# Patient Record
Sex: Female | Born: 1970 | Race: Black or African American | Hispanic: No | Marital: Married | Smoking: Former smoker
Health system: Southern US, Community
[De-identification: ages and names within clinical notes are randomized; demographics above are authoritative.]

## PROBLEM LIST (undated history)

## (undated) ENCOUNTER — Inpatient Hospital Stay: Admission: EM | Payer: Self-pay | Source: Home / Self Care

## (undated) DIAGNOSIS — N200 Calculus of kidney: Secondary | ICD-10-CM

## (undated) DIAGNOSIS — N83209 Unspecified ovarian cyst, unspecified side: Secondary | ICD-10-CM

## (undated) DIAGNOSIS — I10 Essential (primary) hypertension: Secondary | ICD-10-CM

## (undated) DIAGNOSIS — G4730 Sleep apnea, unspecified: Secondary | ICD-10-CM

## (undated) DIAGNOSIS — T7840XA Allergy, unspecified, initial encounter: Secondary | ICD-10-CM

## (undated) DIAGNOSIS — G4733 Obstructive sleep apnea (adult) (pediatric): Principal | ICD-10-CM

## (undated) DIAGNOSIS — E039 Hypothyroidism, unspecified: Secondary | ICD-10-CM

## (undated) DIAGNOSIS — E669 Obesity, unspecified: Secondary | ICD-10-CM

## (undated) DIAGNOSIS — M549 Dorsalgia, unspecified: Secondary | ICD-10-CM

## (undated) DIAGNOSIS — B9689 Other specified bacterial agents as the cause of diseases classified elsewhere: Secondary | ICD-10-CM

## (undated) DIAGNOSIS — M7989 Other specified soft tissue disorders: Secondary | ICD-10-CM

## (undated) DIAGNOSIS — R7303 Prediabetes: Secondary | ICD-10-CM

## (undated) DIAGNOSIS — O139 Gestational [pregnancy-induced] hypertension without significant proteinuria, unspecified trimester: Secondary | ICD-10-CM

## (undated) DIAGNOSIS — K219 Gastro-esophageal reflux disease without esophagitis: Secondary | ICD-10-CM

## (undated) DIAGNOSIS — IMO0002 Reserved for concepts with insufficient information to code with codable children: Secondary | ICD-10-CM

## (undated) DIAGNOSIS — Z8619 Personal history of other infectious and parasitic diseases: Secondary | ICD-10-CM

## (undated) DIAGNOSIS — N951 Menopausal and female climacteric states: Secondary | ICD-10-CM

## (undated) DIAGNOSIS — K829 Disease of gallbladder, unspecified: Secondary | ICD-10-CM

## (undated) DIAGNOSIS — R87619 Unspecified abnormal cytological findings in specimens from cervix uteri: Secondary | ICD-10-CM

## (undated) DIAGNOSIS — R0602 Shortness of breath: Secondary | ICD-10-CM

## (undated) DIAGNOSIS — F329 Major depressive disorder, single episode, unspecified: Secondary | ICD-10-CM

## (undated) DIAGNOSIS — R05 Cough: Secondary | ICD-10-CM

## (undated) DIAGNOSIS — D219 Benign neoplasm of connective and other soft tissue, unspecified: Secondary | ICD-10-CM

## (undated) DIAGNOSIS — E559 Vitamin D deficiency, unspecified: Secondary | ICD-10-CM

## (undated) DIAGNOSIS — R059 Cough, unspecified: Secondary | ICD-10-CM

## (undated) DIAGNOSIS — Z5189 Encounter for other specified aftercare: Secondary | ICD-10-CM

## (undated) DIAGNOSIS — N289 Disorder of kidney and ureter, unspecified: Secondary | ICD-10-CM

## (undated) DIAGNOSIS — R638 Other symptoms and signs concerning food and fluid intake: Secondary | ICD-10-CM

## (undated) DIAGNOSIS — F419 Anxiety disorder, unspecified: Secondary | ICD-10-CM

## (undated) DIAGNOSIS — N760 Acute vaginitis: Secondary | ICD-10-CM

## (undated) DIAGNOSIS — R634 Abnormal weight loss: Secondary | ICD-10-CM

## (undated) DIAGNOSIS — E876 Hypokalemia: Secondary | ICD-10-CM

## (undated) DIAGNOSIS — Z8742 Personal history of other diseases of the female genital tract: Secondary | ICD-10-CM

## (undated) HISTORY — DX: Disease of gallbladder, unspecified: K82.9

## (undated) HISTORY — DX: Personal history of other diseases of the female genital tract: Z87.42

## (undated) HISTORY — DX: Other specified bacterial agents as the cause of diseases classified elsewhere: B96.89

## (undated) HISTORY — DX: Other specified bacterial agents as the cause of diseases classified elsewhere: N76.0

## (undated) HISTORY — DX: Abnormal weight loss: R63.4

## (undated) HISTORY — DX: Allergy, unspecified, initial encounter: T78.40XA

## (undated) HISTORY — DX: Disorder of kidney and ureter, unspecified: N28.9

## (undated) HISTORY — DX: Menopausal and female climacteric states: N95.1

## (undated) HISTORY — DX: Encounter for other specified aftercare: Z51.89

## (undated) HISTORY — DX: Obstructive sleep apnea (adult) (pediatric): G47.33

## (undated) HISTORY — DX: Prediabetes: R73.03

## (undated) HISTORY — DX: Other specified soft tissue disorders: M79.89

## (undated) HISTORY — DX: Major depressive disorder, single episode, unspecified: F32.9

## (undated) HISTORY — DX: Personal history of other infectious and parasitic diseases: Z86.19

## (undated) HISTORY — PX: OTHER SURGICAL HISTORY: SHX169

## (undated) HISTORY — DX: Hypothyroidism, unspecified: E03.9

## (undated) HISTORY — DX: Unspecified ovarian cyst, unspecified side: N83.209

## (undated) HISTORY — DX: Reserved for concepts with insufficient information to code with codable children: IMO0002

## (undated) HISTORY — DX: Dorsalgia, unspecified: M54.9

## (undated) HISTORY — DX: Benign neoplasm of connective and other soft tissue, unspecified: D21.9

## (undated) HISTORY — DX: Obesity, unspecified: E66.9

## (undated) HISTORY — DX: Unspecified abnormal cytological findings in specimens from cervix uteri: R87.619

## (undated) HISTORY — DX: Other symptoms and signs concerning food and fluid intake: R63.8

## (undated) HISTORY — DX: Vitamin D deficiency, unspecified: E55.9

## (undated) HISTORY — DX: Gestational (pregnancy-induced) hypertension without significant proteinuria, unspecified trimester: O13.9

---
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
## 1992-01-07 HISTORY — PX: CHOLECYSTECTOMY: SHX55

## 1997-01-06 HISTORY — PX: TUBAL LIGATION: SHX77

## 2004-03-27 ENCOUNTER — Emergency Department (HOSPITAL_COMMUNITY): Admission: EM | Admit: 2004-03-27 | Discharge: 2004-03-27 | Payer: Self-pay | Admitting: Emergency Medicine

## 2006-01-26 ENCOUNTER — Encounter: Admission: RE | Admit: 2006-01-26 | Discharge: 2006-01-26 | Payer: Self-pay | Admitting: Cardiology

## 2006-02-05 ENCOUNTER — Ambulatory Visit (HOSPITAL_COMMUNITY): Admission: RE | Admit: 2006-02-05 | Discharge: 2006-02-05 | Payer: Self-pay | Admitting: Cardiology

## 2008-01-07 DIAGNOSIS — F32A Depression, unspecified: Secondary | ICD-10-CM

## 2008-01-07 DIAGNOSIS — R634 Abnormal weight loss: Secondary | ICD-10-CM

## 2008-01-07 HISTORY — DX: Depression, unspecified: F32.A

## 2008-01-07 HISTORY — DX: Abnormal weight loss: R63.4

## 2009-01-06 DIAGNOSIS — N83209 Unspecified ovarian cyst, unspecified side: Secondary | ICD-10-CM

## 2009-01-06 DIAGNOSIS — D219 Benign neoplasm of connective and other soft tissue, unspecified: Secondary | ICD-10-CM

## 2009-01-06 DIAGNOSIS — Z8742 Personal history of other diseases of the female genital tract: Secondary | ICD-10-CM

## 2009-01-06 HISTORY — DX: Benign neoplasm of connective and other soft tissue, unspecified: D21.9

## 2009-01-06 HISTORY — DX: Personal history of other diseases of the female genital tract: Z87.42

## 2009-01-06 HISTORY — DX: Unspecified ovarian cyst, unspecified side: N83.209

## 2009-03-06 HISTORY — PX: OTHER SURGICAL HISTORY: SHX169

## 2009-04-05 ENCOUNTER — Ambulatory Visit (HOSPITAL_COMMUNITY): Admission: RE | Admit: 2009-04-05 | Discharge: 2009-04-05 | Payer: Self-pay | Admitting: Obstetrics and Gynecology

## 2010-04-01 LAB — CBC
HCT: 33.5 % — ABNORMAL LOW (ref 36.0–46.0)
Hemoglobin: 10.8 g/dL — ABNORMAL LOW (ref 12.0–15.0)
MCHC: 32.2 g/dL (ref 30.0–36.0)

## 2010-04-01 LAB — BASIC METABOLIC PANEL WITH GFR
BUN: 8 mg/dL (ref 6–23)
CO2: 28 meq/L (ref 19–32)
Calcium: 9.1 mg/dL (ref 8.4–10.5)
Chloride: 102 meq/L (ref 96–112)
Creatinine, Ser: 0.76 mg/dL (ref 0.4–1.2)
GFR calc non Af Amer: >60 mL/min
Glucose, Bld: 103 mg/dL — ABNORMAL HIGH (ref 70–99)
Potassium: 3.1 meq/L — ABNORMAL LOW (ref 3.5–5.1)
Sodium: 138 meq/L (ref 135–145)

## 2010-04-01 LAB — HCG, SERUM, QUALITATIVE: Preg, Serum: NEGATIVE

## 2010-05-24 NOTE — Cardiovascular Report (Signed)
NAMEARIAUNNA, FLEMING             ACCOUNT NO.:  [id number]   MEDICAL RECORD NO.:  [id number]          PATIENT TYPE:  AMB   LOCATION:  SDS                          FACILITY:  [facility]   PHYSICIAN:  Ulice Dash R. Einar Gip, MD       DATE OF BIRTH:  [birth year]   DATE OF PROCEDURE:  02/05/2006  DATE OF DISCHARGE:  02/05/2006                            CARDIAC CATHETERIZATION   REFERRING PHYSICIAN:  Glendale Chard, M.D.   PROCEDURES PERFORMED:  1. Abdominal aortogram.  2. Right femoral angiography and closure of right femoral arterial      axis with StarClose.   INDICATIONS:  Ms. Auch is a 40 year old African-American female with  difficult to control hypertension, morbid obesity, who was referred to  me for evaluation of difficult to control hypertension.  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  Given this, she was brought to the  catheterization lab to evaluate her renal anatomy to confirm renal  artery stenosis with an eye towards possible angioplasty.  There was a  suspicion for fibromuscular dysplasia.   ABDOMINAL AORTOGRAM:  Abdominal aortogram revealed a smooth, normal-  sized abdominal aorta.  There were two renal arteries, one on either  side, from the left and right.  The left renal artery has an anterior  origin but is otherwise smooth and normal.   IMPRESSION:  Normal renal arteries.  No evidence of renal artery  stenosis.  No evidence of abdominal aortic aneurysm.  Normal abdominal  aorta.   RECOMMENDATIONS:  Continued therapy for essential hypertension is  indicated.  The blood pressure now appears to be significantly improved  and controlled.  Will increase Cartia XT to 300 mg once a day.  Will see  her back in the office and make changes in her medications and also  weight loss exercise therapy is indicated.  She will follow up with Dr.  Glendale Chard in two to three weeks.   DETAILS OF PROCEDURE:   Under usual sterile precautions, using a 6  [place], right femoral artery lateral access, a 6 French pigtail catheter  was advanced to the abdominal aorta and an abdominal aortogram was  performed.  The catheter was then pulled out of the [facility].  Right  femoral angiography was performed through the arterial access sheath and  the access was closed with StarClose.  Excellent hemostasis was  obtained.      Eden Lathe. Einar Gip, MD  Electronically Signed     JRG/MEDQ  D:  02/05/2006  T:  02/06/2006  Job:  [id number]   cc:   Theda Belfast. Baird Cancer, M.D.

## 2010-06-20 ENCOUNTER — Emergency Department (HOSPITAL_COMMUNITY)
Admission: EM | Admit: 2010-06-20 | Discharge: 2010-06-20 | Disposition: A | Discharge status: Home / Self Care | Payer: No Typology Code available for payment source | Attending: Emergency Medicine | Admitting: Emergency Medicine

## 2010-06-20 DIAGNOSIS — I10 Essential (primary) hypertension: Secondary | ICD-10-CM | POA: Insufficient documentation

## 2010-06-20 DIAGNOSIS — S335XXA Sprain of ligaments of lumbar spine, initial encounter: Secondary | ICD-10-CM | POA: Insufficient documentation

## 2010-06-20 DIAGNOSIS — E7800 Pure hypercholesterolemia, unspecified: Secondary | ICD-10-CM | POA: Insufficient documentation

## 2010-06-20 DIAGNOSIS — Y92410 Unspecified street and highway as the place of occurrence of the external cause: Secondary | ICD-10-CM | POA: Insufficient documentation

## 2010-06-20 DIAGNOSIS — S139XXA Sprain of joints and ligaments of unspecified parts of neck, initial encounter: Secondary | ICD-10-CM | POA: Insufficient documentation

## 2010-07-15 DIAGNOSIS — N951 Menopausal and female climacteric states: Secondary | ICD-10-CM

## 2010-07-15 HISTORY — DX: Menopausal and female climacteric states: N95.1

## 2010-08-19 ENCOUNTER — Inpatient Hospital Stay (HOSPITAL_COMMUNITY)
Admission: EM | Admit: 2010-08-19 | Discharge: 2010-08-26 | DRG: 641 | Disposition: A | Discharge status: Home / Self Care | Payer: 59 | Attending: Internal Medicine | Admitting: Internal Medicine

## 2010-08-19 ENCOUNTER — Emergency Department (HOSPITAL_COMMUNITY): Payer: 59

## 2010-08-19 DIAGNOSIS — E872 Acidosis, unspecified: Secondary | ICD-10-CM | POA: Diagnosis present

## 2010-08-19 DIAGNOSIS — E876 Hypokalemia: Principal | ICD-10-CM | POA: Diagnosis present

## 2010-08-19 DIAGNOSIS — Z6841 Body Mass Index (BMI) 40.0 and over, adult: Secondary | ICD-10-CM

## 2010-08-19 DIAGNOSIS — N289 Disorder of kidney and ureter, unspecified: Secondary | ICD-10-CM | POA: Diagnosis present

## 2010-08-19 DIAGNOSIS — R209 Unspecified disturbances of skin sensation: Secondary | ICD-10-CM | POA: Diagnosis present

## 2010-08-19 DIAGNOSIS — E785 Hyperlipidemia, unspecified: Secondary | ICD-10-CM | POA: Diagnosis present

## 2010-08-19 DIAGNOSIS — R7301 Impaired fasting glucose: Secondary | ICD-10-CM | POA: Diagnosis present

## 2010-08-19 DIAGNOSIS — F17200 Nicotine dependence, unspecified, uncomplicated: Secondary | ICD-10-CM | POA: Diagnosis present

## 2010-08-19 DIAGNOSIS — I10 Essential (primary) hypertension: Secondary | ICD-10-CM | POA: Diagnosis present

## 2010-08-19 LAB — URINALYSIS, ROUTINE W REFLEX MICROSCOPIC
Ketones, ur: NEGATIVE mg/dL
Leukocytes, UA: NEGATIVE
Protein, ur: >300 mg/dL — AB
Specific Gravity, Urine: 1.016 (ref 1.005–1.030)
Urobilinogen, UA: 1 mg/dL (ref 0.0–1.0)
pH: 6.5 (ref 5.0–8.0)

## 2010-08-19 LAB — CBC
HCT: 40.5 % (ref 36.0–46.0)
MCH: 32.6 pg (ref 26.0–34.0)
RBC: 4.45 MIL/uL (ref 3.87–5.11)
WBC: 9.9 10*3/uL (ref 4.0–10.5)

## 2010-08-19 LAB — BASIC METABOLIC PANEL
BUN: 13 mg/dL (ref 6–23)
Calcium: 8.9 mg/dL (ref 8.4–10.5)
Chloride: 87 mEq/L — ABNORMAL LOW (ref 96–112)

## 2010-08-19 LAB — DIFFERENTIAL
Basophils Relative: 0 % (ref 0–1)
Eosinophils Relative: 3 % (ref 0–5)
Lymphocytes Relative: 22 % (ref 12–46)
Lymphs Abs: 2.2 10*3/uL (ref 0.7–4.0)
Monocytes Absolute: 0.5 10*3/uL (ref 0.1–1.0)
Monocytes Relative: 5 % (ref 3–12)

## 2010-08-19 LAB — URINE MICROSCOPIC-ADD ON

## 2010-08-19 LAB — PHOSPHORUS: Phosphorus: 4 mg/dL (ref 2.3–4.6)

## 2010-08-19 LAB — HEMOGLOBIN A1C: Hgb A1c MFr Bld: 6.4 % — ABNORMAL HIGH (ref <5.7)

## 2010-08-20 LAB — BASIC METABOLIC PANEL
Calcium: 7.9 mg/dL — ABNORMAL LOW (ref 8.4–10.5)
Chloride: 94 mEq/L — ABNORMAL LOW (ref 96–112)
Creatinine, Ser: 0.73 mg/dL (ref 0.50–1.10)
GFR calc non Af Amer: >60 mL/min (ref >60)
Glucose, Bld: 113 mg/dL — ABNORMAL HIGH (ref 70–99)

## 2010-08-20 LAB — URINE CULTURE: Colony Count: 60000

## 2010-08-20 LAB — POTASSIUM: Potassium: 2.4 mEq/L — CL (ref 3.5–5.1)

## 2010-08-20 LAB — LIPID PANEL
Triglycerides: 355 mg/dL — ABNORMAL HIGH (ref <150)
VLDL: 71 mg/dL — ABNORMAL HIGH (ref 0–40)

## 2010-08-20 LAB — CBC
Hemoglobin: 12.7 g/dL (ref 12.0–15.0)
MCHC: 35.8 g/dL (ref 30.0–36.0)
MCV: 91.5 fL (ref 78.0–100.0)

## 2010-08-20 LAB — MAGNESIUM: Magnesium: 2.2 mg/dL (ref 1.5–2.5)

## 2010-08-21 LAB — BASIC METABOLIC PANEL
BUN: 6 mg/dL (ref 6–23)
CO2: 33 mEq/L — ABNORMAL HIGH (ref 19–32)
Calcium: 7.5 mg/dL — ABNORMAL LOW (ref 8.4–10.5)
Chloride: 100 mEq/L (ref 96–112)
Creatinine, Ser: 0.58 mg/dL (ref 0.50–1.10)
GFR calc Af Amer: >60 mL/min (ref >60)

## 2010-08-22 LAB — BASIC METABOLIC PANEL
BUN: 5 mg/dL — ABNORMAL LOW (ref 6–23)
CO2: 30 mEq/L (ref 19–32)
Glucose, Bld: 109 mg/dL — ABNORMAL HIGH (ref 70–99)
Potassium: 3.1 mEq/L — ABNORMAL LOW (ref 3.5–5.1)
Sodium: 139 mEq/L (ref 135–145)

## 2010-08-23 LAB — BASIC METABOLIC PANEL
BUN: 11 mg/dL (ref 6–23)
BUN: 10 mg/dL (ref 6–23)
CO2: 28 mEq/L (ref 19–32)
Chloride: 100 mEq/L (ref 96–112)
Glucose, Bld: 122 mg/dL — ABNORMAL HIGH (ref 70–99)
Potassium: 2.8 mEq/L — ABNORMAL LOW (ref 3.5–5.1)
Potassium: 3.6 mEq/L (ref 3.5–5.1)
Sodium: 137 mEq/L (ref 135–145)
Sodium: 138 mEq/L (ref 135–145)

## 2010-08-23 LAB — CREATININE, URINE, RANDOM: Creatinine, Urine: 98.7 mg/dL

## 2010-08-23 LAB — NA AND K (SODIUM & POTASSIUM), RAND UR
Potassium Urine: 27 mEq/L
Sodium, Ur: 30 mEq/L

## 2010-08-24 LAB — BASIC METABOLIC PANEL
BUN: 12 mg/dL (ref 6–23)
BUN: 11 mg/dL (ref 6–23)
CO2: 28 mEq/L (ref 19–32)
CO2: 24 mEq/L (ref 19–32)
Calcium: 8.5 mg/dL (ref 8.4–10.5)
Calcium: 9.2 mg/dL (ref 8.4–10.5)
GFR calc non Af Amer: >60 mL/min (ref >60)
GFR calc non Af Amer: >60 mL/min (ref >60)
Glucose, Bld: 114 mg/dL — ABNORMAL HIGH (ref 70–99)
Glucose, Bld: 137 mg/dL — ABNORMAL HIGH (ref 70–99)
Potassium: 4.1 mEq/L (ref 3.5–5.1)

## 2010-08-25 LAB — BASIC METABOLIC PANEL
BUN: 10 mg/dL (ref 6–23)
CO2: 23 mEq/L (ref 19–32)
CO2: 25 mEq/L (ref 19–32)
Calcium: 9.6 mg/dL (ref 8.4–10.5)
Chloride: 103 mEq/L (ref 96–112)
Chloride: 102 mEq/L (ref 96–112)
GFR calc non Af Amer: 59 mL/min — ABNORMAL LOW (ref >60)
Glucose, Bld: 142 mg/dL — ABNORMAL HIGH (ref 70–99)
Glucose, Bld: 175 mg/dL — ABNORMAL HIGH (ref 70–99)
Potassium: 4.1 mEq/L (ref 3.5–5.1)
Sodium: 136 mEq/L (ref 135–145)
Sodium: 137 mEq/L (ref 135–145)

## 2010-08-25 LAB — RENAL FUNCTION PANEL
BUN: 11 mg/dL (ref 6–23)
CO2: 24 mEq/L (ref 19–32)
GFR calc Af Amer: >60 mL/min (ref >60)
Glucose, Bld: 110 mg/dL — ABNORMAL HIGH (ref 70–99)
Phosphorus: 3.6 mg/dL (ref 2.3–4.6)
Potassium: 4.7 mEq/L (ref 3.5–5.1)
Sodium: 138 mEq/L (ref 135–145)

## 2010-08-26 LAB — BASIC METABOLIC PANEL
BUN: 13 mg/dL (ref 6–23)
CO2: 25 mEq/L (ref 19–32)
Chloride: 101 mEq/L (ref 96–112)
Creatinine, Ser: 0.77 mg/dL (ref 0.50–1.10)
GFR calc Af Amer: >60 mL/min (ref >60)
Glucose, Bld: 107 mg/dL — ABNORMAL HIGH (ref 70–99)
Potassium: 4.1 mEq/L (ref 3.5–5.1)

## 2010-08-26 NOTE — Consult Note (Signed)
Robyn [name], Robyn [name]             ACCOUNT NO.:  [id number]  MEDICAL RECORD NO.:  [id number]  LOCATION:  1402                         FACILITY:  [facility]  PHYSICIAN:  Elmarie Shiley, MD          DATE OF BIRTH:  [birth year]  DATE OF CONSULTATION:  08/23/2010 DATE OF DISCHARGE:                                CONSULTATION   PRIMARY CARE PROVIDER:  Theda Belfast. Baird Cancer, M.D. [facility].  Nephrology is consulted by Dr. Jacquelynn Cree of the [facility] for evaluation and management of Robyn [name]'s hypokalemia.  HISTORY OF PRESENT ILLNESS:  Robyn [name] is a recent 40 year old African American woman with past medical history significant for hypertension, dyslipidemia, obesity, and recurrent nephrolithiasis since the age of 23.  She was admitted to the hospital 4 days ago with numbness, tingling as well as cramping of both upper and lower extremities and at that point found to have a significantly low potassium of less than 2 mEq/L. Since admission, she has undergone evaluation for transient ischemic attack and stroke, which have been negative.  She has undergone aggressive repletion of potassium and magnesium; however, potassium levels remain significantly depressed.  She has been taken off her diuretics with hopes of improving her potassium; however, levels remain depressed.  She denies any over-the-counter laxatives, denies any European licorice intake, denies any slimming aids, and previous evaluation for renal artery stenosis by angiography has been negative.  Review of records, both from her primary care provider's office as well as E-chart as far back as 2008 is remarkable for hypokalemia, metabolic alkalosis, and concomitant hypertension.  She remarks that in the past, she was on hydrochlorothiazide, however, this was discontinued on account of the hypokalemia.  She states that she has had nephrolithiasis at several junctures and treated by  multiple ways including laser lithotripsy, open extraction and spontaneous passage.  Her last episode was about 6 years ago.  PAST MEDICAL HISTORY: 1. Recurrent hypokalemia, metabolic alkalosis. 2. Hypertension. 3. Dyslipidemia. 4. Obesity. 5. Recurrent nephrolithiasis. 6. Negative evaluation for renal artery stenosis by angiography in     2008.  MEDICATIONS:  Currently while here in the hospital, 1. Tekturna 300 mg p.o. daily. 2. Benicar 40 mg p.o. daily. 3. Spironolactone 50 mg p.o. t.i.d. (started today). 4. Carvedilol CR 40 mg q.h.s. 5. Cholecalciferol 400 units daily. 6. Lovenox 60 mg subcutaneously. 7. Magnesium oxide 400 mg q.i.d. 8. Multivitamin daily. 9. Lovaza 1 g daily. 10.K-Dur 40 mEq q.i.d. 11.Crestor 10 mg q.h.s. 12.Xanax 0.5 mg q.8 h. p.r.n.  ALLERGIES:  SULFA causes skin rash.  FAMILY HISTORY:  Very strong family history of hypertension as well as diabetes.  Her father is on dialysis in the [facility].  She is married, resides at home with her husband.  Smokes about half a pack of cigarettes a day.  Denies any alcohol or other illicit drug abuse.  REVIEW OF SYSTEMS:  Negative other than the mentioned in the history of presenting illness.  PHYSICAL EXAMINATION:  VITAL SIGNS:  Blood pressure 132/85, respirations of 16, pulse 77, temperature 98.5, oxygen saturation 92% on room air. GENERAL EVALUATION: Young obese African American woman sitting comfortably in  bed. HEAD, NECK AND ENT SYSTEMS:  Head is normocephalic, atraumatic.  Pupils are bilaterally equal and reactive to light.  Extraocular muscle movements are normal.  Funduscopy was not done. NECK:  Soft, supple, bulky without any obvious JVD or goiter.  No lymphadenopathy.  No bruits. CARDIOVASCULAR ASSESSMENT:  Pulses regular in rate and rhythm.  Heart sounds S1 and S2 are normal without any obvious murmurs, rubs, or gallops. RESPIRATORY:  Evaluation both lung fields are clear to auscultation.   No rales, retractions, or rhonchi. ABDOMEN:  Soft, obese, nontender without any organomegaly and bowel sounds are normal. EXTREMITIES:  No edema is palpable over either lower extremity.  LABORATORY DATA:  Sodium 137, potassium 2.8, bicarbonate 28, BUN 11, creatinine 0.7, glucose 122, calcium 7.6, magnesium 1.9, random urine creatinine is 98, random urine sodium 30, random urine potassium 27.  A urine potassium to creatinine ratio is 27 mEq/mg creatinine.  ASSESSMENT AND PLAN:  Hypokalemia.  This appears to be a chronic problem at least as far back as 2008.  Magnesium levels are currently repleted and likely not contributing to her chronically depressed hypokalemia. She was noted to be difficult to replete in spite of being on Tekturna and Benicar.  Her fractional excretion of potassium/potassium handling by the kidneys appears to be inappropriate for level of hypokalemia, suggesting that she likely have a renal defect in potassium handling. 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 Adrenal axis testing is pending with a plasma renin activity to an aldosterone ratio.  Agree at this time with starting her on spironolactone for both blood pressure and potassium management and discontinuation of furosemide.  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  Previous assessment for renal artery stenosis has been noted to be negative.     Elmarie Shiley, MD     JP/MEDQ  D:  08/23/2010  T:  08/23/2010  Job:  [id number]  cc:   Theda Belfast. Baird Cancer, M.D. [phone]  Electronically Signed by Elmarie Shiley MD on 08/26/2010 01:15:52 PM

## 2010-09-01 LAB — ALDOSTERONE + RENIN ACTIVITY W/ RATIO
ALDO / PRA Ratio: 488.9 Ratio — ABNORMAL HIGH (ref 0.9–28.9)
Aldosterone: 44 ng/dL — ABNORMAL HIGH
PRA LC/MS/MS: 0.09 ng/mL/h — ABNORMAL LOW (ref 0.25–5.82)

## 2010-09-14 NOTE — Discharge Summary (Signed)
Robyn [name], Robyn [name]             ACCOUNT NO.:  [id number]  MEDICAL RECORD NO.:  [id number]  LOCATION:  [id number]                         FACILITY:  [facility]  PHYSICIAN:  Niel Hummer, MD    DATE OF BIRTH:  07-21-1970  DATE OF ADMISSION:  08/19/2010 DATE OF DISCHARGE:  08/26/2010                        DISCHARGE SUMMARY - REFERRING   PRIMARY CARE PHYSICIAN:  Dr. Glendale Chard with [facility].  DISCHARGE DIAGNOSES: 1. Hypokalemia/metabolic acidosis and hypertension. 2. Hyperlipidemia. 3. Tobacco abuse. 4. Morbid obesity. 5. Impaired fasting glucose.  CONSULTATIONS DURING HOSPITALIZATION:  Dr. Elmarie Shiley with [facility].  PROCEDURES DURING HOSPITALIZATION:  CT of the head performed August 19, 2010, within normal limits.  HISTORY OF PRESENT ILLNESS:  Robyn [name] is a very pleasant African American female with past medical history for hypertension, hyperlipidemia, and impaired fasting glucose newly diagnosed, who presented to the emergency room on day of admission with complaints of numbness and tingling in arms and leg.  Apparently, the patient was driving to work on day of admission when she noticed sudden onset of tingling in her legs that progressed to her arms and face.  Upon initial evaluation in the emergency department, the patient was treated as a TIA, stroke workup.  CT of the head obtained at that time was unremarkable.  However, further workup revealed a potassium level of less than 2.0, prompting call to [facility] for admission and further evaluation.  Upon further discussion with the patient, the patient denied any use of over-the-counter laxatives, slimming aids, or European licorice.  Upon further review, the patient's hypokalemia seemed to be present since 2008 with previous evaluation for renal artery stenosis by angiographic being negative.  COURSE OF HOSPITALIZATION: 1. Profound hypokalemia/metabolic  acidosis/hypertension.  Likely the     cause of the patient's neurological manifestations as stroke and     TIA workup were unremarkable at the time of admission and the     patient's symptoms have since resolved with stabilization of     potassium.  The patient was admitted under the hospitalist service     to telemetry unit with attempts at aggressive potassium repletion     being unsuccessful.  At that point, the patient was taken off her     diuretics; however, the patient's potassium level continued to     remain significantly low.  Nephrology was asked to see the patient in     consultation..  Dr. Posey Pronto has followed the patient in     consultation.  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  However, complete workup is still     pending at the time of dictation.  The patient has had an excellent     response to amiloride with stabilization of potassium and     magnesium.  She has been taken off all potassium and magnesium     supplements at this point as well as her amlodipine and Tekturna.   The patient has felt stable for discharge home with very close     outpatient followup.  She has been  scheduled at [facility] for weekly BMETs and magnesium checks as well as follow     up with Dr. Posey Pronto as listed below.  The patient was instructed on     holding parameters for Coreg that is to be continued at decreased     dose at the time of discharge as the patient did experience some     orthostasis on day prior to disposition that has resolved with     adjustment of medications.  MEDICATIONS AT THE TIME OF DISCHARGE: 1. Alprazolam 0.5 mg half tablet p.o. q.8 h. p.r.n. anxiety. 2. Aspirin 81 mg p.o. daily. 3. Coreg 6.25 mg p.o. b.i.d. (the patient instructed to hold for blood     pressure less than 120 or heart rate less than 60). 4. Benicar 40 mg p.o.  daily. 5. Oxycodone IR 5 mg p.o. q.8 h. p.r.n. pain. 6. Crestor 10 mg p.o. daily. 7. Fish oil 1 capsule p.o. daily. 8. Lubricant eye drops in both eyes daily. 9. Multivitamins p.o. daily. 10.Vitamin D3 over-the-counter p.o. daily. 11.The patient was instructed to discontinue Lasix, Azor, and     Tekturna.  PERTINENT LABORATORY FINDINGS:  Discharge BMET shows sodium of 137, potassium 4.1, BUN 13, creatinine 0.77, and magnesium 1.9.  Lipid profile, total cholesterol 188, triglycerides 355, HDL 39, and LDL 78.  TSH 1.583 and hemoglobin A1c 6.4.  DISPOSITION:  Again, the patient is felt medically stable for discharge home at this time.  The patient is scheduled for followup with Dr. Posey Pronto on September 19th at 2:30 p.m.  In addition, the patient is to begin weekly BMET and magnesium checks on August 22nd at [facility].  The patient can follow up with her primary care physician, Dr. Glendale Chard in 2 to 3 weeks or as needed.  The patient verifies understanding of discharge instructions.     Patrici Ranks, NP   ______________________________ Niel Hummer, MD    LE/MEDQ  D:  08/26/2010  T:  08/26/2010  Job:  [id number]  cc:   Theda Belfast. Baird Cancer, M.D. Fax: [phone]  Elmarie Shiley, MD Fax: [phone]  Electronically Signed by Patrici Ranks NP on 09/06/2010 03:23:17 PM Electronically Signed by Niel Hummer MD on 09/14/2010 12:57:40 PM

## 2010-09-18 NOTE — H&P (Signed)
NAMEMarland Kitchen  Robyn [name], Robyn [name]             ACCOUNT NO.:  [id number]  MEDICAL RECORD NO.:  [id number]  LOCATION:  WLED                         FACILITY:  [facility]  PHYSICIAN:  Domingo Mend, M.D. DATE OF BIRTH:  1970-07-19  DATE OF ADMISSION:  08/19/2010 DATE OF DISCHARGE:                             HISTORY & PHYSICAL   PRIMARY CARE PHYSICIAN:  Robyn N. Baird Cancer, M.D.  CHIEF COMPLAINT:  Numbness and tingling of both arms and legs.  HISTORY OF PRESENT ILLNESS:  [name] is a very pleasant 40 year old obese African American woman who has a past medical history significant for hypertension, hyperlipidemia, and apparently newly diagnosed impaired fasting glucose, she was driving to work today when suddenly she noticed tingling of her legs that progressed to include her arms and her face.  She pulled over to the side of the road where a bystander called EMS and brought her into the hospital.  Initially, she was treated as a TIA/stroke workup and had a CT scan of the head, which was negative, however, when serum chemistries returned, she was found to have a potassium level of less than 2 and we are called to assist with evaluation and management.  ALLERGIES:  She has stated allergies to SULFA drugs, which cause a rash.  PAST MEDICAL HISTORY:  Significant for, 1. Hypertension. 2. Morbid obesity. 3. Hyperlipidemia. 4. History of kidney stones. 5. Impaired fasting glucose, recently diagnosed.  HOME MEDICATIONS:  She does not know the doses of any of her medications, but she is on Lasix, Tekturna, Crestor, Azor, Coreg.  SOCIAL HISTORY:  She smokes about half a pack a day.  No alcohol or illicit drug use.  She is married, her husband is present at time of my exam.  REVIEW OF SYSTEMS:  Negative except as mentioned in history of present illness.  FAMILY HISTORY:  Significant for both parents with hypertension and diabetes.  Her father has had multiple strokes and is now on hemodialysis  with end-stage renal disease.  PHYSICAL EXAMINATION:  VITAL SIGNS:  On admission, blood pressure 158/103, heart rate 101, respirations 18, temperature of 98.6, sats 94% on room air. GENERAL:  She is currently alert, awake, oriented x3, does not appear to be in any distress, very pleasant, and cooperative with exam and interview. HEENT:  Normocephalic, atraumatic.  Pupils are equally round, reactive to light. NECK:  Supple.  No JVD, no lymphadenopathy, no bruits, no goiter. HEART:  Regular rate and rhythm without murmurs, rubs, or gallops. LUNGS:  Clear to auscultation bilaterally. ABDOMEN:  Obese, soft, nontender, nondistended.  Positive bowel sounds. EXTREMITIES:  She has no edema. NEUROLOGIC EXAM:  Grossly intact and nonfocal.  LABORATORY DATA:  Labs on admission:  Sodium 139, potassium less than 2, chloride 87, bicarb 37, BUN 13, creatinine 0.42, glucose of 135, a calcium of 8.9.  WBCs 9.9, hemoglobin 14.5, platelets of 344.  A CT scan of the head is negative for acute findings.  ASSESSMENT AND PLAN: 1. Numbness and tingling of both arms and legs.  Unlikely that this     would be a neurological manifestation of an ischemic cerebral event     given it is bilateral.  Most likely, this is  the result of her     hypokalemia.  I will admit her to a telemetry bed under observation     with the purpose replete her potassium.  I will give her potassium     chloride both orally and through her IV.  I will check a magnesium     level to make sure this does not need to be repleted as well.  I do     not find it necessary to continue workup for stroke/transient     ischemic attack unless repletion of her potassium fails to resolve     her symptoms. 2. For hypertension, we will continue her home medications once     pharmacy has reconciled her meds. 3. For hyperlipidemia, we will check a fasting lipid profile and     continue her statin. 4. For deep vein thrombosis prophylaxis, she will be  on Lovenox.     Domingo Mend, M.D.     EH/MEDQ  D:  08/19/2010  T:  08/19/2010  Job:  [id number]  cc:   Theda Belfast. Baird Cancer, M.D. [phone]  Electronically Signed by Domingo Mend M.D. on 09/18/2010 [id number] PM

## 2010-10-10 ENCOUNTER — Emergency Department (HOSPITAL_COMMUNITY)
Admission: EM | Admit: 2010-10-10 | Discharge: 2010-10-10 | Disposition: A | Discharge status: Home / Self Care | Payer: 59 | Attending: Emergency Medicine | Admitting: Emergency Medicine

## 2010-10-10 ENCOUNTER — Emergency Department (HOSPITAL_COMMUNITY): Payer: 59

## 2010-10-10 DIAGNOSIS — R0609 Other forms of dyspnea: Secondary | ICD-10-CM | POA: Insufficient documentation

## 2010-10-10 DIAGNOSIS — E669 Obesity, unspecified: Secondary | ICD-10-CM | POA: Insufficient documentation

## 2010-10-10 DIAGNOSIS — R0602 Shortness of breath: Secondary | ICD-10-CM | POA: Insufficient documentation

## 2010-10-10 DIAGNOSIS — E119 Type 2 diabetes mellitus without complications: Secondary | ICD-10-CM | POA: Insufficient documentation

## 2010-10-10 DIAGNOSIS — I10 Essential (primary) hypertension: Secondary | ICD-10-CM | POA: Insufficient documentation

## 2010-10-10 DIAGNOSIS — R42 Dizziness and giddiness: Secondary | ICD-10-CM | POA: Insufficient documentation

## 2010-10-10 DIAGNOSIS — Z79899 Other long term (current) drug therapy: Secondary | ICD-10-CM | POA: Insufficient documentation

## 2010-10-10 DIAGNOSIS — F411 Generalized anxiety disorder: Secondary | ICD-10-CM | POA: Insufficient documentation

## 2010-10-10 DIAGNOSIS — R079 Chest pain, unspecified: Secondary | ICD-10-CM | POA: Insufficient documentation

## 2010-10-10 DIAGNOSIS — E7800 Pure hypercholesterolemia, unspecified: Secondary | ICD-10-CM | POA: Insufficient documentation

## 2010-10-10 DIAGNOSIS — R209 Unspecified disturbances of skin sensation: Secondary | ICD-10-CM | POA: Insufficient documentation

## 2010-10-10 DIAGNOSIS — R0989 Other specified symptoms and signs involving the circulatory and respiratory systems: Secondary | ICD-10-CM | POA: Insufficient documentation

## 2010-10-10 DIAGNOSIS — R51 Headache: Secondary | ICD-10-CM | POA: Insufficient documentation

## 2010-10-10 LAB — COMPREHENSIVE METABOLIC PANEL
ALT: 23 U/L (ref 0–35)
AST: 19 U/L (ref 0–37)
Albumin: 3.6 g/dL (ref 3.5–5.2)
CO2: 27 mEq/L (ref 19–32)
Chloride: 102 mEq/L (ref 96–112)
Creatinine, Ser: 0.85 mg/dL (ref 0.50–1.10)
GFR calc non Af Amer: 85 mL/min — ABNORMAL LOW (ref >90)
Sodium: 138 mEq/L (ref 135–145)
Total Bilirubin: 0.5 mg/dL (ref 0.3–1.2)

## 2010-10-10 LAB — DIFFERENTIAL
Basophils Relative: 0 % (ref 0–1)
Eosinophils Absolute: 0.3 10*3/uL (ref 0.0–0.7)
Eosinophils Relative: 5 % (ref 0–5)
Lymphs Abs: 1.6 10*3/uL (ref 0.7–4.0)
Neutrophils Relative %: 63 % (ref 43–77)

## 2010-10-10 LAB — CBC
Platelets: 286 10*3/uL (ref 150–400)
RBC: 4.2 MIL/uL (ref 3.87–5.11)
RDW: 12.9 % (ref 11.5–15.5)
WBC: 6.1 10*3/uL (ref 4.0–10.5)

## 2010-10-10 LAB — POCT I-STAT TROPONIN I: Troponin i, poc: 0.01 ng/mL (ref 0.00–0.08)

## 2010-11-11 ENCOUNTER — Ambulatory Visit: Payer: No Typology Code available for payment source | Admitting: Family Medicine

## 2011-01-06 ENCOUNTER — Other Ambulatory Visit: Payer: Self-pay | Admitting: Internal Medicine

## 2011-01-15 ENCOUNTER — Ambulatory Visit: Payer: No Typology Code available for payment source | Admitting: Family Medicine

## 2011-01-23 ENCOUNTER — Encounter (INDEPENDENT_AMBULATORY_CARE_PROVIDER_SITE_OTHER): Payer: 59 | Admitting: Family Medicine

## 2011-01-23 DIAGNOSIS — K3189 Other diseases of stomach and duodenum: Secondary | ICD-10-CM

## 2011-01-23 DIAGNOSIS — N360 Urethral fistula: Secondary | ICD-10-CM

## 2011-01-23 DIAGNOSIS — I10 Essential (primary) hypertension: Secondary | ICD-10-CM

## 2011-01-23 DIAGNOSIS — R1084 Generalized abdominal pain: Secondary | ICD-10-CM

## 2011-01-28 ENCOUNTER — Other Ambulatory Visit: Payer: Self-pay | Admitting: Family Medicine

## 2011-01-28 DIAGNOSIS — I10 Essential (primary) hypertension: Secondary | ICD-10-CM

## 2011-01-28 DIAGNOSIS — R809 Proteinuria, unspecified: Secondary | ICD-10-CM

## 2011-01-30 ENCOUNTER — Ambulatory Visit
Admission: RE | Admit: 2011-01-30 | Discharge: 2011-01-30 | Disposition: A | Discharge status: Home / Self Care | Payer: 59 | Source: Ambulatory Visit | Attending: Family Medicine | Admitting: Family Medicine

## 2011-01-30 ENCOUNTER — Other Ambulatory Visit: Payer: No Typology Code available for payment source

## 2011-01-30 ENCOUNTER — Ambulatory Visit: Payer: 59 | Admitting: Family Medicine

## 2011-01-30 ENCOUNTER — Other Ambulatory Visit: Payer: Self-pay | Admitting: Family Medicine

## 2011-01-30 DIAGNOSIS — R109 Unspecified abdominal pain: Secondary | ICD-10-CM

## 2011-02-05 ENCOUNTER — Telehealth: Payer: Self-pay | Admitting: Family Medicine

## 2011-02-05 NOTE — Telephone Encounter (Signed)
Pt. Returning call to [employer]. Please fax forms to [phone].

## 2011-02-12 NOTE — Telephone Encounter (Signed)
Spoke with Alashea in [place] and she has taken care of the fax for this pt.

## 2011-02-18 ENCOUNTER — Other Ambulatory Visit: Payer: Self-pay | Admitting: Urology

## 2011-02-18 DIAGNOSIS — N200 Calculus of kidney: Secondary | ICD-10-CM

## 2011-02-19 ENCOUNTER — Other Ambulatory Visit: Payer: Self-pay | Admitting: Urology

## 2011-03-31 ENCOUNTER — Other Ambulatory Visit: Payer: Self-pay | Admitting: Physician Assistant

## 2011-04-02 ENCOUNTER — Other Ambulatory Visit (HOSPITAL_COMMUNITY): Payer: 59

## 2011-04-03 ENCOUNTER — Other Ambulatory Visit (HOSPITAL_COMMUNITY): Payer: 59

## 2011-04-03 ENCOUNTER — Encounter (HOSPITAL_COMMUNITY)
Admission: RE | Admit: 2011-04-03 | Discharge: 2011-04-03 | Disposition: A | Discharge status: Home / Self Care | Payer: 59 | Source: Ambulatory Visit | Attending: Urology | Admitting: Urology

## 2011-04-03 ENCOUNTER — Encounter (HOSPITAL_COMMUNITY): Payer: Self-pay

## 2011-04-03 HISTORY — DX: Hypokalemia: E87.6

## 2011-04-03 HISTORY — DX: Sleep apnea, unspecified: G47.30

## 2011-04-03 HISTORY — DX: Gastro-esophageal reflux disease without esophagitis: K21.9

## 2011-04-03 HISTORY — DX: Calculus of kidney: N20.0

## 2011-04-03 HISTORY — DX: Shortness of breath: R06.02

## 2011-04-03 HISTORY — DX: Encounter for other specified aftercare: Z51.89

## 2011-04-03 HISTORY — DX: Anxiety disorder, unspecified: F41.9

## 2011-04-03 HISTORY — DX: Essential (primary) hypertension: I10

## 2011-04-03 LAB — BASIC METABOLIC PANEL
BUN: 18 mg/dL (ref 6–23)
CO2: 26 mEq/L (ref 19–32)
Calcium: 9.6 mg/dL (ref 8.4–10.5)
Chloride: 102 mEq/L (ref 96–112)
Creatinine, Ser: 1.09 mg/dL (ref 0.50–1.10)

## 2011-04-03 LAB — CBC
HCT: 36.8 % (ref 36.0–46.0)
MCH: 32.9 pg (ref 26.0–34.0)
MCHC: 35.3 g/dL (ref 30.0–36.0)
MCV: 93.2 fL (ref 78.0–100.0)
Platelets: 250 10*3/uL (ref 150–400)
RDW: 12.6 % (ref 11.5–15.5)

## 2011-04-03 LAB — SURGICAL PCR SCREEN: Staphylococcus aureus: NEGATIVE

## 2011-04-03 NOTE — Patient Instructions (Signed)
YOUR SURGERY IS SCHEDULED ON:  WED  4/3  8:00 AM RADIOLOGY   AND 11:00 AM SURGERY  REPORT TO [facility] AT 7:00 AM      PHONE # FOR SHORT STAY IS [phone]  DO NOT EAT OR DRINK ANYTHING AFTER MIDNIGHT THE NIGHT BEFORE YOUR SURGERY.  YOU MAY BRUSH YOUR TEETH, RINSE OUT YOUR MOUTH--BUT NO WATER, NO FOOD, NO CHEWING GUM, NO MINTS, NO CANDIES, NO CHEWING TOBACCO.  PLEASE TAKE THE FOLLOWING MEDICATIONS THE AM OF YOUR SURGERY WITH A FEW SIPS OF WATER:  CARVEDILOL, HYDRALAZINE, OMEPRAZOLE AND ALPRAZOLAM IF NEEDED FOR ANXIETY    IF YOU USE INHALERS--USE YOUR INHALERS THE AM OF YOUR SURGERY AND BRING INHALERS TO [place].    IF YOU ARE DIABETIC:  DO NOT TAKE ANY DIABETIC MEDICATIONS THE AM OF YOUR SURGERY.  IF YOU TAKE INSULIN IN THE EVENINGS--PLEASE ONLY TAKE 1/2 NORMAL EVENING DOSE THE NIGHT BEFORE YOUR SURGERY.  NO INSULIN THE AM OF YOUR SURGERY.  IF YOU HAVE SLEEP APNEA AND USE CPAP OR BIPAP--PLEASE BRING THE MASK --NOT THE MACHINE-NOT THE TUBING   -JUST THE MASK. DO NOT BRING VALUABLES, MONEY, CREDIT CARDS.  CONTACT LENS, DENTURES / PARTIALS, GLASSES SHOULD NOT BE WORN TO SURGERY AND IN MOST CASES-HEARING AIDS WILL NEED TO BE REMOVED.  BRING YOUR GLASSES CASE, ANY EQUIPMENT NEEDED FOR YOUR CONTACT LENS. FOR PATIENTS ADMITTED TO THE HOSPITAL--CHECK OUT TIME THE DAY OF DISCHARGE IS 11:00 AM.  ALL INPATIENT ROOMS ARE PRIVATE - WITH BATHROOM, TELEPHONE, TELEVISION AND WIFI INTERNET. IF YOU ARE BEING DISCHARGED THE SAME DAY OF YOUR SURGERY--YOU CAN NOT DRIVE YOURSELF HOME--AND SHOULD NOT GO HOME ALONE BY TAXI OR BUS.  NO DRIVING OR OPERATING MACHINERY FOR 24 HOURS FOLLOWING ANESTHESIA / PAIN MEDICATIONS.                            SPECIAL INSTRUCTIONS:  CHLORHEXIDINE SOAP SHOWER (other brand names are Betasept and Hibiclens ) PLEASE SHOWER WITH CHLORHEXIDINE THE NIGHT BEFORE YOUR SURGERY AND THE AM OF YOUR SURGERY. DO NOT USE CHLORHEXIDINE ON YOUR FACE OR PRIVATE  AREAS--YOU MAY USE YOUR NORMAL SOAP THOSE AREAS AND YOUR NORMAL SHAMPOO.  WOMEN SHOULD AVOID SHAVING UNDER ARMS AND SHAVING LEGS 6 HOURS BEFORE USING CHLORHEXIDINE TO AVOID SKIN IRRITATION.  DO NOT USE IF ALLERGIC TO CHLORHEXIDINE.  PLEASE READ OVER ANY  FACT SHEETS THAT YOU WERE GIVEN: MRSA INFORMATION AND BLOOD FACTS

## 2011-04-03 NOTE — Pre-Procedure Instructions (Signed)
PT/S B/P 184/112 -  RECHECKED  176/111.  PT STATES HER B/P USUALLY HIGH AND DR. PATEL JUST CHANGED HER HYDRALAZINE DOSAGE ON Tuesday 04/01/11 FROM 25 MG BID TO 50 MG BID.  PT SAYS SHE CAN CHECK HER B/P AT HOME--PT INSTRUCTED TO KEEP CHECK ON HER B/P THE REST OF THIS WEEK AND IF HER B/P REMAINS HIGH -SHE SHOULD SEE DR. PATEL ON Monday--LET HIM KNOW ABOUT HER SURGERY PLANNED FOR 4/3--PT MADE AWARE THAT HER SURGERY MIGHT POSSIBLY BE CANCELLED IF HER B/P TOO HIGH.  SHE VOICED UNDERSTANDING. CBC, BMET, PT WERE DONE PREOP TODAY.  PT'S T/S WILL BE DONE ON ARRIVAL FOR SURGERY.  PT HAD EKG AND CXR REPORTS FROM [facility] DONE 10/10/10--COPIES IN EPIC AND ON PT'S CHART.

## 2011-04-03 NOTE — Pre-Procedure Instructions (Signed)
NOTE FAXED TO DR. Delton Coombes OFFICE TO LET HIM KNOW ABOUT PT'S B/P TODAY-PREOP- 176/111 AND THAT PT STATED HER B/P MEDICATION HYDRALAZINE WAS JUST INCREASED ON Tuesday 04/01/11 BY DR. PATEL.  I INSTRUCTED PT TO CHECK HER B/P REST OF THIS WEEK AND IF B/P REMAINS ELEVATED SHE SHOULD SEE DR. PATEL ON Monday  4/1.

## 2011-04-09 ENCOUNTER — Ambulatory Visit (HOSPITAL_COMMUNITY): Payer: 59

## 2011-04-09 ENCOUNTER — Encounter (HOSPITAL_COMMUNITY): Payer: Self-pay | Admitting: *Deleted

## 2011-04-09 ENCOUNTER — Other Ambulatory Visit: Payer: Self-pay | Admitting: Radiology

## 2011-04-09 ENCOUNTER — Inpatient Hospital Stay (HOSPITAL_COMMUNITY)
Admission: RE | Admit: 2011-04-09 | Discharge: 2011-04-11 | DRG: 660 | Disposition: A | Discharge status: Home / Self Care | Payer: 59 | Source: Ambulatory Visit | Attending: Urology | Admitting: Urology

## 2011-04-09 ENCOUNTER — Other Ambulatory Visit: Payer: Self-pay | Admitting: Urology

## 2011-04-09 ENCOUNTER — Encounter (HOSPITAL_COMMUNITY): Payer: Self-pay | Admitting: Anesthesiology

## 2011-04-09 ENCOUNTER — Encounter (HOSPITAL_COMMUNITY)
Admission: RE | Disposition: A | Discharge status: Home / Self Care | Payer: Self-pay | Source: Ambulatory Visit | Attending: Urology

## 2011-04-09 ENCOUNTER — Ambulatory Visit (HOSPITAL_COMMUNITY)
Admission: RE | Admit: 2011-04-09 | Discharge: 2011-04-09 | Disposition: A | Discharge status: Home / Self Care | Payer: 59 | Source: Ambulatory Visit | Attending: Urology | Admitting: Urology

## 2011-04-09 ENCOUNTER — Ambulatory Visit (HOSPITAL_COMMUNITY): Payer: 59 | Admitting: Anesthesiology

## 2011-04-09 DIAGNOSIS — N200 Calculus of kidney: Secondary | ICD-10-CM

## 2011-04-09 DIAGNOSIS — G4730 Sleep apnea, unspecified: Secondary | ICD-10-CM | POA: Diagnosis present

## 2011-04-09 DIAGNOSIS — N289 Disorder of kidney and ureter, unspecified: Secondary | ICD-10-CM | POA: Diagnosis present

## 2011-04-09 DIAGNOSIS — R109 Unspecified abdominal pain: Secondary | ICD-10-CM | POA: Diagnosis present

## 2011-04-09 DIAGNOSIS — Z6841 Body Mass Index (BMI) 40.0 and over, adult: Secondary | ICD-10-CM

## 2011-04-09 DIAGNOSIS — K219 Gastro-esophageal reflux disease without esophagitis: Secondary | ICD-10-CM | POA: Diagnosis present

## 2011-04-09 DIAGNOSIS — I10 Essential (primary) hypertension: Secondary | ICD-10-CM | POA: Diagnosis present

## 2011-04-09 DIAGNOSIS — F17200 Nicotine dependence, unspecified, uncomplicated: Secondary | ICD-10-CM | POA: Diagnosis present

## 2011-04-09 DIAGNOSIS — Z01812 Encounter for preprocedural laboratory examination: Secondary | ICD-10-CM

## 2011-04-09 HISTORY — PX: NEPHROLITHOTOMY: SHX5134

## 2011-04-09 HISTORY — PX: URETEROSCOPY: SHX842

## 2011-04-09 LAB — HEMOGLOBIN AND HEMATOCRIT, BLOOD: Hemoglobin: 11.6 g/dL — ABNORMAL LOW (ref 12.0–15.0)

## 2011-04-09 LAB — ABO/RH: ABO/RH(D): O POS

## 2011-04-09 LAB — TYPE AND SCREEN

## 2011-04-09 SURGERY — NEPHROLITHOTOMY PERCUTANEOUS
Anesthesia: General | Site: Ureter | Laterality: Right | Wound class: Clean

## 2011-04-09 MED ORDER — IOHEXOL 300 MG/ML  SOLN: 30.0000 mL | Freq: Once | INTRAMUSCULAR | Status: DC | PRN

## 2011-04-09 MED ORDER — SODIUM CHLORIDE 0.9 % IR SOLN
Status: DC | PRN
  Administered 2011-04-09: 10000 mL
  Administered 2011-04-09: 9000 mL

## 2011-04-09 MED ORDER — ALPRAZOLAM 0.5 MG PO TABS: 0.5000 mg | ORAL_TABLET | Freq: Every evening | ORAL | Status: DC | PRN

## 2011-04-09 MED ORDER — ALPRAZOLAM 0.5 MG PO TABS: 0.5000 mg | ORAL_TABLET | Freq: Every day | ORAL | Status: DC | PRN

## 2011-04-09 MED ORDER — MORPHINE SULFATE 2 MG/ML IJ SOLN
2.0000 mg | INTRAMUSCULAR | Status: DC | PRN
  Administered 2011-04-09 – 2011-04-10 (×3): 2 mg via INTRAVENOUS
  Filled 2011-04-09 (×3): qty 1

## 2011-04-09 MED ORDER — IOHEXOL 300 MG/ML  SOLN
INTRAMUSCULAR | Status: AC
  Filled 2011-04-09: qty 2

## 2011-04-09 MED ORDER — HYOSCYAMINE SULFATE 0.125 MG SL SUBL
0.1250 mg | SUBLINGUAL_TABLET | SUBLINGUAL | Status: DC | PRN
  Filled 2011-04-09: qty 1

## 2011-04-09 MED ORDER — IOHEXOL 300 MG/ML  SOLN
INTRAMUSCULAR | Status: DC | PRN
  Administered 2011-04-09: 50 mL via INTRAVENOUS

## 2011-04-09 MED ORDER — EPHEDRINE SULFATE 50 MG/ML IJ SOLN
INTRAMUSCULAR | Status: DC | PRN
  Administered 2011-04-09 (×2): 5 mg via INTRAVENOUS
  Administered 2011-04-09: 10 mg via INTRAVENOUS

## 2011-04-09 MED ORDER — FENTANYL CITRATE 0.05 MG/ML IJ SOLN
INTRAMUSCULAR | Status: DC | PRN
  Administered 2011-04-09: 100 ug via INTRAVENOUS
  Administered 2011-04-09: 50 ug via INTRAVENOUS
  Administered 2011-04-09: 100 ug via INTRAVENOUS
  Administered 2011-04-09: 50 ug via INTRAVENOUS

## 2011-04-09 MED ORDER — LACTATED RINGERS IV SOLN: INTRAVENOUS | Status: DC

## 2011-04-09 MED ORDER — HYDROMORPHONE HCL PF 2 MG/ML IJ SOLN: 1.0000 mg | INTRAMUSCULAR | Status: DC | PRN

## 2011-04-09 MED ORDER — SODIUM CHLORIDE 0.9 % IV SOLN
INTRAVENOUS | Status: DC
  Administered 2011-04-09 – 2011-04-10 (×3): via INTRAVENOUS

## 2011-04-09 MED ORDER — GENTAMICIN SULFATE 40 MG/ML IJ SOLN
120.0000 mg | INTRAVENOUS | Status: DC
  Filled 2011-04-09: qty 3

## 2011-04-09 MED ORDER — FENTANYL CITRATE 0.05 MG/ML IJ SOLN
INTRAMUSCULAR | Status: AC
  Filled 2011-04-09: qty 2

## 2011-04-09 MED ORDER — ROCURONIUM BROMIDE 100 MG/10ML IV SOLN
INTRAVENOUS | Status: DC | PRN
  Administered 2011-04-09: 20 mg via INTRAVENOUS
  Administered 2011-04-09: 10 mg via INTRAVENOUS
  Administered 2011-04-09: 50 mg via INTRAVENOUS
  Administered 2011-04-09: 10 mg via INTRAVENOUS

## 2011-04-09 MED ORDER — HYDROMORPHONE HCL PF 1 MG/ML IJ SOLN: 0.2500 mg | INTRAMUSCULAR | Status: DC | PRN

## 2011-04-09 MED ORDER — PROPOFOL 10 MG/ML IV BOLUS
INTRAVENOUS | Status: DC | PRN
  Administered 2011-04-09: 200 mg via INTRAVENOUS

## 2011-04-09 MED ORDER — ACETAMINOPHEN 10 MG/ML IV SOLN
1000.0000 mg | Freq: Four times a day (QID) | INTRAVENOUS | Status: AC
  Administered 2011-04-09 – 2011-04-10 (×4): 1000 mg via INTRAVENOUS
  Filled 2011-04-09 (×4): qty 100

## 2011-04-09 MED ORDER — ONDANSETRON HCL 4 MG/2ML IJ SOLN
INTRAMUSCULAR | Status: DC | PRN
  Administered 2011-04-09: 4 mg via INTRAVENOUS

## 2011-04-09 MED ORDER — SENNOSIDES-DOCUSATE SODIUM 8.6-50 MG PO TABS
1.0000 | ORAL_TABLET | Freq: Two times a day (BID) | ORAL | Status: DC
  Administered 2011-04-09 – 2011-04-11 (×4): 1 via ORAL
  Filled 2011-04-09 (×5): qty 1

## 2011-04-09 MED ORDER — LIDOCAINE HCL 1 % IJ SOLN
INTRAMUSCULAR | Status: AC
  Filled 2011-04-09: qty 20

## 2011-04-09 MED ORDER — PANTOPRAZOLE SODIUM 40 MG PO TBEC
40.0000 mg | DELAYED_RELEASE_TABLET | Freq: Every day | ORAL | Status: DC
  Administered 2011-04-09 – 2011-04-11 (×3): 40 mg via ORAL
  Filled 2011-04-09 (×3): qty 1

## 2011-04-09 MED ORDER — OXYCODONE HCL 5 MG PO TABS
5.0000 mg | ORAL_TABLET | ORAL | Status: DC | PRN
  Administered 2011-04-09 – 2011-04-11 (×5): 10 mg via ORAL
  Filled 2011-04-09 (×3): qty 2
  Filled 2011-04-09: qty 1
  Filled 2011-04-09 (×2): qty 2

## 2011-04-09 MED ORDER — CARVEDILOL 25 MG PO TABS
25.0000 mg | ORAL_TABLET | Freq: Two times a day (BID) | ORAL | Status: DC
  Administered 2011-04-09 – 2011-04-11 (×4): 25 mg via ORAL
  Filled 2011-04-09 (×5): qty 1

## 2011-04-09 MED ORDER — SODIUM CHLORIDE 0.9 % IV SOLN
1.0000 g | Freq: Four times a day (QID) | INTRAVENOUS | Status: AC
  Administered 2011-04-09 – 2011-04-10 (×3): 1 g via INTRAVENOUS
  Filled 2011-04-09 (×3): qty 1000

## 2011-04-09 MED ORDER — FENTANYL CITRATE 0.05 MG/ML IJ SOLN
INTRAMUSCULAR | Status: AC
  Filled 2011-04-09: qty 4

## 2011-04-09 MED ORDER — PHENYLEPHRINE HCL 10 MG/ML IJ SOLN
INTRAMUSCULAR | Status: DC | PRN
  Administered 2011-04-09: 100 ug via INTRAVENOUS
  Administered 2011-04-09: 50 ug via INTRAVENOUS

## 2011-04-09 MED ORDER — HYDRALAZINE HCL 50 MG PO TABS
50.0000 mg | ORAL_TABLET | Freq: Two times a day (BID) | ORAL | Status: DC
  Filled 2011-04-09: qty 1

## 2011-04-09 MED ORDER — ACETAMINOPHEN 325 MG PO TABS: 650.0000 mg | ORAL_TABLET | ORAL | Status: DC | PRN

## 2011-04-09 MED ORDER — LIDOCAINE HCL (CARDIAC) 20 MG/ML IV SOLN
INTRAVENOUS | Status: DC | PRN
  Administered 2011-04-09: 50 mg via INTRAVENOUS

## 2011-04-09 MED ORDER — MIDAZOLAM HCL 5 MG/5ML IJ SOLN
INTRAMUSCULAR | Status: DC | PRN
  Administered 2011-04-09: 2 mg via INTRAVENOUS

## 2011-04-09 MED ORDER — MIDAZOLAM HCL 5 MG/5ML IJ SOLN
INTRAMUSCULAR | Status: AC | PRN
  Administered 2011-04-09 (×4): 1 mg via INTRAVENOUS

## 2011-04-09 MED ORDER — AMILORIDE HCL 5 MG PO TABS
5.0000 mg | ORAL_TABLET | Freq: Two times a day (BID) | ORAL | Status: DC
  Administered 2011-04-09 – 2011-04-11 (×4): 5 mg via ORAL
  Filled 2011-04-09 (×5): qty 1

## 2011-04-09 MED ORDER — ONDANSETRON HCL 4 MG/2ML IJ SOLN: 4.0000 mg | INTRAMUSCULAR | Status: DC | PRN

## 2011-04-09 MED ORDER — GENTAMICIN SULFATE 40 MG/ML IJ SOLN
INTRAMUSCULAR | Status: DC | PRN
  Administered 2011-04-09: 120 mg via INTRAMUSCULAR

## 2011-04-09 MED ORDER — METOPROLOL TARTRATE 1 MG/ML IV SOLN: 5.0000 mg | INTRAVENOUS | Status: DC | PRN

## 2011-04-09 MED ORDER — HYDRALAZINE HCL 50 MG PO TABS
50.0000 mg | ORAL_TABLET | Freq: Three times a day (TID) | ORAL | Status: DC
  Administered 2011-04-09 – 2011-04-11 (×7): 50 mg via ORAL
  Filled 2011-04-09 (×9): qty 1

## 2011-04-09 MED ORDER — LACTATED RINGERS IV SOLN
INTRAVENOUS | Status: DC | PRN
  Administered 2011-04-09 (×3): via INTRAVENOUS

## 2011-04-09 MED ORDER — SODIUM CHLORIDE 0.9 % IV SOLN
INTRAVENOUS | Status: DC
  Administered 2011-04-09: 08:00:00 via INTRAVENOUS

## 2011-04-09 MED ORDER — SODIUM CHLORIDE 0.9 % IV SOLN
2.0000 g | INTRAVENOUS | Status: AC
  Administered 2011-04-09: 2 g via INTRAVENOUS
  Filled 2011-04-09: qty 2000

## 2011-04-09 MED ORDER — MIDAZOLAM HCL 2 MG/2ML IJ SOLN
INTRAMUSCULAR | Status: AC
  Filled 2011-04-09: qty 2

## 2011-04-09 MED ORDER — GENTAMICIN IN SALINE 1.6-0.9 MG/ML-% IV SOLN: INTRAVENOUS | Status: DC | PRN

## 2011-04-09 MED ORDER — FENTANYL CITRATE 0.05 MG/ML IJ SOLN
INTRAMUSCULAR | Status: AC | PRN
  Administered 2011-04-09 (×3): 50 ug via INTRAVENOUS
  Administered 2011-04-09: 100 ug via INTRAVENOUS
  Administered 2011-04-09: 50 ug via INTRAVENOUS

## 2011-04-09 MED ORDER — CIPROFLOXACIN IN D5W 400 MG/200ML IV SOLN
400.0000 mg | INTRAVENOUS | Status: AC
  Administered 2011-04-09: 400 mg via INTRAVENOUS

## 2011-04-09 MED ORDER — GENTAMICIN SULFATE 40 MG/ML IJ SOLN
420.0000 mg | Freq: Once | INTRAVENOUS | Status: AC
  Administered 2011-04-09: 420 mg via INTRAVENOUS
  Filled 2011-04-09: qty 10.5

## 2011-04-09 MED ORDER — MIDAZOLAM HCL 2 MG/2ML IJ SOLN
INTRAMUSCULAR | Status: AC
  Filled 2011-04-09: qty 4

## 2011-04-09 SURGICAL SUPPLY — 61 items
ADAPTER CATH URET PLST 4-6FR (CATHETERS) ×2 IMPLANT
ADPR CATH URET STRL DISP 4-6FR (CATHETERS)
APL SKNCLS STERI-STRIP NONHPOA (GAUZE/BANDAGES/DRESSINGS) ×2
BAG URINE DRAINAGE (UROLOGICAL SUPPLIES) ×3 IMPLANT
BAG URO CATCHER STRL LF (DRAPE) ×2 IMPLANT
BANDAGE GAUZE ELAST BULKY 4 IN (GAUZE/BANDAGES/DRESSINGS) ×3 IMPLANT
BASKET STONE NITINOL 3FRX115MB (UROLOGICAL SUPPLIES) IMPLANT
BASKET ZERO TIP NITINOL 2.4FR (BASKET) IMPLANT
BENZOIN TINCTURE PRP APPL 2/3 (GAUZE/BANDAGES/DRESSINGS) ×5 IMPLANT
BSKT STON RTRVL ZERO TP 2.4FR (BASKET)
CATCHER STONE W/TUBE ADAPTER (UROLOGICAL SUPPLIES) ×2 IMPLANT
CATH FOLEY 2W COUNCIL 20FR 5CC (CATHETERS) ×1 IMPLANT
CATH FOLEY 2WAY SLVR  5CC 18FR (CATHETERS) ×3
CATH FOLEY 2WAY SLVR 5CC 18FR (CATHETERS) ×2 IMPLANT
CATH INTERMIT  6FR 70CM (CATHETERS) IMPLANT
CATH ROBINSON RED A/P 20FR (CATHETERS) IMPLANT
CATH URET 5FR 28IN OPEN ENDED (CATHETERS) ×3 IMPLANT
CATH URET DUAL LUMEN 6-10FR 50 (CATHETERS) ×3 IMPLANT
CATH X-FORCE N30 NEPHROSTOMY (TUBING) ×3 IMPLANT
CHLORAPREP W/TINT 26ML (MISCELLANEOUS) ×3 IMPLANT
CLOTH BEACON ORANGE TIMEOUT ST (SAFETY) ×3 IMPLANT
COVER SURGICAL LIGHT HANDLE (MISCELLANEOUS) ×3 IMPLANT
DRAPE C-ARM 42X72 X-RAY (DRAPES) ×3 IMPLANT
DRAPE CAMERA CLOSED 9X96 (DRAPES) ×3 IMPLANT
DRAPE LINGEMAN PERC (DRAPES) ×3 IMPLANT
DRAPE SURG IRRIG POUCH 19X23 (DRAPES) ×3 IMPLANT
DRAPE UTILITY XL STRL (DRAPES) ×3 IMPLANT
GLOVE BIOGEL M STRL SZ7.5 (GLOVE) ×3 IMPLANT
GLOVE BIOGEL PI IND STRL 7.5 (GLOVE) ×2 IMPLANT
GLOVE BIOGEL PI INDICATOR 7.5 (GLOVE) ×1
GLOVE ECLIPSE 7.0 STRL STRAW (GLOVE) ×3 IMPLANT
GOWN PREVENTION PLUS XLARGE (GOWN DISPOSABLE) ×3 IMPLANT
GOWN STRL NON-REIN LRG LVL3 (GOWN DISPOSABLE) ×6 IMPLANT
GUIDEWIRE ANG ZIPWIRE 038X150 (WIRE) IMPLANT
GUIDEWIRE STR DUAL SENSOR (WIRE) ×3 IMPLANT
KIT BASIN OR (CUSTOM PROCEDURE TRAY) ×3 IMPLANT
LASER FIBER DISP (UROLOGICAL SUPPLIES) IMPLANT
LASER FIBER DISP 1000U (UROLOGICAL SUPPLIES) IMPLANT
MANIFOLD NEPTUNE II (INSTRUMENTS) ×3 IMPLANT
NS IRRIG 1000ML POUR BTL (IV SOLUTION) ×3 IMPLANT
PACK BASIC VI WITH GOWN DISP (CUSTOM PROCEDURE TRAY) ×2 IMPLANT
PACK CYSTO (CUSTOM PROCEDURE TRAY) ×3 IMPLANT
PROBE EHL 9 FR 470CM (MISCELLANEOUS) IMPLANT
PROBE LITHOCLAST ULTRA 3.8X403 (UROLOGICAL SUPPLIES) ×1 IMPLANT
PROBE PNEUMATIC 1.0MMX570MM (UROLOGICAL SUPPLIES) ×2 IMPLANT
SET IRRIG Y TYPE TUR BLADDER L (SET/KITS/TRAYS/PACK) ×3 IMPLANT
SET WARMING FLUID IRRIGATION (MISCELLANEOUS) ×3 IMPLANT
SPONGE GAUZE 4X4 12PLY (GAUZE/BANDAGES/DRESSINGS) ×2 IMPLANT
SPONGE LAP 4X18 X RAY DECT (DISPOSABLE) ×2 IMPLANT
STENT ENDOURETEROTOMY 7-14 26C (STENTS) IMPLANT
STONE CATCHER W/TUBE ADAPTER (UROLOGICAL SUPPLIES) ×3 IMPLANT
SUT SILK 2 0 30  PSL (SUTURE) ×1
SUT SILK 2 0 30 PSL (SUTURE) ×2 IMPLANT
SYR 20CC LL (SYRINGE) ×5 IMPLANT
SYRINGE 10CC LL (SYRINGE) ×3 IMPLANT
SYRINGE IRR TOOMEY STRL 70CC (SYRINGE) ×3 IMPLANT
TAPE PAPER 3X10 WHT MICROPORE (GAUZE/BANDAGES/DRESSINGS) ×1 IMPLANT
TOWEL OR NON WOVEN STRL DISP B (DISPOSABLE) ×3 IMPLANT
TRAY FOLEY CATH 14FRSI W/METER (CATHETERS) ×3 IMPLANT
TUBING CONNECTING 10 (TUBING) ×9 IMPLANT
WATER STERILE IRR 1500ML POUR (IV SOLUTION) ×3 IMPLANT

## 2011-04-09 NOTE — Progress Notes (Signed)
Dr. Landry Dyke made aware of patient's blood pressures.

## 2011-04-09 NOTE — Progress Notes (Signed)
ANTIBIOTIC CONSULT NOTE - INITIAL  Pharmacy Consult for Gentamicin Indication: Synergy  Allergies  Allergen Reactions  . Sulfa Antibiotics Hives and Rash    Patient Measurements:   Actual body weight: (04/09/11): 122 kg Height: 168 cm IBW: 59.6 kg Adjusted Body Weight: 84.6 kg  Vital Signs: Temp: 97.9 F (36.6 C) (04/03 1610) Temp src: Oral (04/03 0746) BP: 149/97 mmHg (04/03 1610) Pulse Rate: 76  (04/03 1610)  Labs:  Basename 04/09/11 1458  WBC --  HGB 11.6*  PLT --  LABCREA --  CREATININE --   CrCl is unknown because there is no height on file for the current visit.  Microbiology: Recent Results (from the past 720 hour(s))  SURGICAL PCR SCREEN     Status: Normal   Collection Time   04/03/11 10:40 AM      Component Value Range Status Comment   MRSA, PCR NEGATIVE  NEGATIVE  Final    Staphylococcus aureus NEGATIVE  NEGATIVE  Final     Medical History: Past Medical History  Diagnosis Date  . Hypertension   . Shortness of breath     ONLY WITH ANXIETY  . Anxiety     OCCAS PANIC ATTACKS  . Blood transfusion 1990'S  . Kidney stones   . GERD (gastroesophageal reflux disease)     OCCAS REFLUX-WOULD TAKE OMEPRAZOLE IF NEEDED  . Sleep apnea     PT USES CPAP SOMETIMES - SETTING IS 3  . Hypokalemia     PAST HX   Assessment: 47yof s/p nephrolithotomy and ureteroscopy to begin post-op antibiotics x 1 day with ampicillin and gentamicin.  No pre-op antibiotics were charted.  Will use adjusted body weight to dose 5mg /kg dose of synergistic gentamicin.  No SCr drawn for this admission yet, but based on SCr 1.09 (drawn 04/03/11), CrCl~90 ml/min.  Plan:  Gentamicin 420 mg IV once today. Agree with ampicillin dose of 1g IV q6h (will end tomorrow morning after 24 hours worth of doses).  Robyn [name] 04/09/2011,4:41 PM

## 2011-04-09 NOTE — Transfer of Care (Signed)
Immediate Anesthesia Transfer of Care Note  Patient: Robyn [name]  Procedure(s) Performed: Procedure(s) (LRB): NEPHROLITHOTOMY PERCUTANEOUS (Right) URETEROSCOPY (Right)  Patient Location: PACU  Anesthesia Type: General  Level of Consciousness: awake and patient cooperative  Airway & Oxygen Therapy: Patient Spontanous Breathing and Patient connected to face mask oxygen  Post-op Assessment: Report given to PACU RN, Post -op Vital signs reviewed and stable and Patient moving all extremities  Post vital signs: Reviewed and stable  Complications: No apparent anesthesia complications

## 2011-04-09 NOTE — Progress Notes (Signed)
Dr. Jasmine December in- aware of patient's urine being very bloody  With occasional clots noted in foley catheter tubing.

## 2011-04-09 NOTE — H&P (Signed)
Robyn [name] is an 41 y.o. female.   Chief Complaint: right nephrolithiasis with history of pyuria. HPI: History of renal stones - set up for nephrostomy placement prior to OR for access.   Past Medical History  Diagnosis Date  . Hypertension   . Shortness of breath     ONLY WITH ANXIETY  . Anxiety     OCCAS PANIC ATTACKS  . Blood transfusion 1990'S  . Kidney stones   . GERD (gastroesophageal reflux disease)     OCCAS REFLUX-WOULD TAKE OMEPRAZOLE IF NEEDED  . Sleep apnea     PT USES CPAP SOMETIMES - SETTING IS 3  . Hypokalemia     PAST HX    Past Surgical History  Procedure Date  . Percutaneous nephrostomy around 1996   . Uterine ablation march 2010   . C-sections x 2   . Cholecystectomy     Social History:  reports that she has been smoking Cigarettes.  She has a 3.75 pack-year smoking history. She has never used smokeless tobacco. She reports that she drinks alcohol. She reports that she does not use illicit drugs.  Allergies:  Allergies  Allergen Reactions  . Sulfa Antibiotics Hives and Rash    No current facility-administered medications on file as of .   Medications Prior to Admission  Medication Sig Dispense Refill  . ALPRAZolam (XANAX) 0.25 MG tablet Take 0.5 mg by mouth daily as needed. Anxiety      . aMILoride (MIDAMOR) 5 MG tablet Take 5 mg by mouth 2 (two) times daily.      . carvedilol (COREG) 25 MG tablet Take 25 mg by mouth 2 (two) times daily with a meal.      . Cholecalciferol (VITAMIN D) 400 UNITS capsule Take 400 Units by mouth daily.      . fish oil-omega-3 fatty acids 1000 MG capsule Take 1 g by mouth daily.      . hydrALAZINE (APRESOLINE) 25 MG tablet Take 50 mg by mouth 2 (two) times daily.       Marland Kitchen lisinopril (PRINIVIL,ZESTRIL) 40 MG tablet Take 40 mg by mouth every morning.      . Multiple Vitamins-Minerals (MULTIVITAMIN WITH MINERALS) tablet Take 1 tablet by mouth daily.      . vitamin B-12 (CYANOCOBALAMIN) 500 MCG tablet Take 500 mcg by  mouth daily.        Results for orders placed during the hospital encounter of 04/09/11 (from the past 48 hour(s))  TYPE AND SCREEN     Status: Normal (Preliminary result)   Collection Time   04/09/11  7:30 AM      Component Value Range Comment   ABO/RH(D) O POS      Antibody Screen PENDING      Sample Expiration 04/12/2011     ABO/RH     Status: Normal (Preliminary result)   Collection Time   04/09/11  7:30 AM      Component Value Range Comment   ABO/RH(D) O POS       Review of Systems  Constitutional: Negative for fever.  Respiratory: Negative for cough and hemoptysis.   Cardiovascular: Negative for chest pain and leg swelling.  Gastrointestinal: Positive for abdominal pain.  Genitourinary: Positive for flank pain. Negative for dysuria, urgency, frequency and hematuria.  Neurological: Negative for seizures.  Psychiatric/Behavioral: Positive for depression. The patient is nervous/anxious.     Physical Exam  Constitutional: She is oriented to person, place, and time. She appears well-developed and well-nourished. No  distress.  HENT:  Head: Normocephalic and atraumatic.  Neck: Normal range of motion.  Cardiovascular: Normal rate and regular rhythm.  Exam reveals friction rub. Exam reveals no gallop.   No murmur heard.      Few pvc's   Respiratory: Effort normal and breath sounds normal. She has no wheezes. She has no rales.  GI: Soft. There is no tenderness.  Neurological: She is alert and oriented to person, place, and time.  Skin: Skin is warm and dry.  Psychiatric: She has a normal mood and affect. Her behavior is normal. Judgment and thought content normal.     Assessment/Plan Discussed in detail procedure for percutaneous nephrostomy tube placement prior to OR for access for lithotomy procedure.  Details, risks and benefits have been discussed with the patient's apparent understanding.  Written consent obtained.  Labs WNL to proceed. Consent witnessed.   Robyn [name]  D 04/09/2011, 8:17 AM

## 2011-04-09 NOTE — H&P (Signed)
Urology History and Physical Exam  CC: Right nephrolithiasis  HPI:   41 year old female with nephrolithiasis. CT 01/30/11 shows right lower pole stone measuring 2.3 x 1.9 x 1.1 cm. There is a left lower pole stone measuring 1.4 x 1.6 cm. She presents today for right PCNL.  She has back pain in her paraspinous muscle as well as abdominal pain that is not consistent with stone pain. We discussed that this surgery is not likely to relieve that pain. We have discussed the risks, benefits, alternatives, and likelihood of achieving her goals.  Her urine culture from clinic was negative. She has had percutaneous access established by IR today.   PMH: Past Medical History  Diagnosis Date  . Hypertension   . Shortness of breath     ONLY WITH ANXIETY  . Anxiety     OCCAS PANIC ATTACKS  . Blood transfusion 1990'S  . Kidney stones   . GERD (gastroesophageal reflux disease)     OCCAS REFLUX-WOULD TAKE OMEPRAZOLE IF NEEDED  . Sleep apnea     PT USES CPAP SOMETIMES - SETTING IS 3  . Hypokalemia     PAST HX    PSH: Past Surgical History  Procedure Date  . Percutaneous nephrostomy around 1996   . Uterine ablation march 2010   . C-sections x 2   . Cholecystectomy     Allergies: Allergies  Allergen Reactions  . Sulfa Antibiotics Hives and Rash    Medications: No prescriptions prior to admission     Social History: History   Social History  . Marital Status: Married    Spouse Name: N/A    Number of Children: N/A  . Years of Education: N/A   Occupational History  . Not on file.   Social History Main Topics  . Smoking status: Current Everyday Smoker -- 0.2 packs/day for 15 years    Types: Cigarettes  . Smokeless tobacco: Never Used  . Alcohol Use: Yes     OCCAS  . Drug Use: No  . Sexually Active:    Other Topics Concern  . Not on file   Social History Narrative  . No narrative on file    Family History: No family history on file.  Review of Systems: Positive:  None Negative: Chest pain, SOB, fever.  A further 10 point review of systems was negative except what is listed in the HPI.  Physical Exam:  General: No acute distress.  Awake. Head:  Normocephalic.  Atraumatic. ENT:  EOMI.  Mucous membranes moist Neck:  Supple.  No lymphadenopathy. CV:  S1 present. S2 present. Regular rate. Pulmonary: Equal effort bilaterally.  Clear to auscultation bilaterally. Abdomen: Soft.  Non- tender to palpation. Skin:  Normal turgor.  No visible rash. Extremity: No gross deformity of bilateral upper extremities.  No gross deformity of    bilateral lower extremities. Neurologic: Alert. Appropriate mood.    Studies:  No results found for this basename: HGB:2,WBC:2,PLT:2 in the last 72 hours  No results found for this basename: NA:2,K:2,CL:2,CO2:2,BUN:2,CREATININE:2,CALCIUM:2,MAGNESIUM:2,GFRNONAA:2,GFRAA:2 in the last 72 hours   No results found for this basename: PT:2,INR:2,APTT:2 in the last 72 hours   No components found with this basename: ABG:2    Assessment:  Right nephrolithiasis  Plan: To OR for right PCNL

## 2011-04-09 NOTE — Progress Notes (Signed)
Patient is currently on the Alaris EtCo2 monitor via nasal canula. The canula does not fit inside of her home nasal CPAP mask, and she does not wish to use a hospital supplied mask. The has refused the use of nocturnal CPAP for tonight. However, she has agreed to notify the RN or RT if the need arises. CPAP machine and equipment remains at bedside. She agrees to wear CPAP nocturnally beginning tomorrow night if she is no longer on the Alaris monitor. RT will continue to follow nightly.

## 2011-04-09 NOTE — Preoperative (Signed)
Beta Blockers   Reason not to administer Beta Blockers:Not Applicable 

## 2011-04-09 NOTE — Progress Notes (Signed)
GU post-op check  Patient doing well. Pain controlled.  Has some HTN.  Filed Vitals:   04/09/11 1610  BP: 149/97  Pulse: 76  Temp: 97.9 F (36.6 C)  Resp: 20   Gen: NAD Abd: soft, NTTP Chest: equal effort bilaterally. GU: Right nephrostomy and foley catheter with cherry color urine, but more clear than in PACU  A/P: Right PCNL -HTN: Add IV metoprolol prn; increase hydralazine dose to TID from BID. -Cath out in morning. -Ambulate tonight.

## 2011-04-09 NOTE — Brief Op Note (Signed)
04/09/2011  2:01 PM  PATIENT:  Robyn [name]  41 y.o. female  PRE-OPERATIVE DIAGNOSIS:  Right Nephrolithiasis  POST-OPERATIVE DIAGNOSIS:  Right Nephrolithiasis  PROCEDURE:  Procedure(s) (LRB): NEPHROLITHOTOMY PERCUTANEOUS (Right) >2 cm   SURGEON:  Surgeon(s) and Role:    * Molli Hazard, MD - Primary  PHYSICIAN ASSISTANT:   ASSISTANTS: none   ANESTHESIA:   general  EBL: 100cc  Total I/O In: 1800 [I.V.:1800] Out: 200 [Blood:200]  BLOOD ADMINISTERED:none  DRAINS: Urinary Catheter (Foley) and right nephrostomy tube, right nephroureteral catheter.   LOCAL MEDICATIONS USED:  NONE  SPECIMEN:  Source of Specimen:  right renal stone  DISPOSITION OF SPECIMEN:  AUS lab.  COUNTS:  YES  TOURNIQUET:  * No tourniquets in log *  DICTATION: .Other Dictation: Dictation Number [id number]  PLAN OF CARE: Admit to inpatient   PATIENT DISPOSITION:  PACU - hemodynamically stable.   Delay start of Pharmacological VTE agent (>24hrs) due to surgical blood loss or risk of bleeding: yes

## 2011-04-09 NOTE — Progress Notes (Signed)
Hgb. And Hct. Drawn by lab. 

## 2011-04-09 NOTE — Anesthesia Preprocedure Evaluation (Addendum)
Anesthesia Evaluation  Patient identified by MRN, date of birth, ID band Patient awake    Reviewed: Allergy & Precautions, H&P , NPO status , Patient's Chart, lab work & pertinent test results, reviewed documented beta blocker date and time   Airway Mallampati: II TM Distance: >3 FB Neck ROM: full    Dental  (+) Chipped and Dental Advisory Given,    Pulmonary neg pulmonary ROS, shortness of breath and with exertion, sleep apnea and Continuous Positive Airway Pressure Ventilation ,  breath sounds clear to auscultation  Pulmonary exam normal       Cardiovascular Exercise Tolerance: Good hypertension, Pt. on home beta blockers negative cardio ROS  Rhythm:regular Rate:Normal     Neuro/Psych negative neurological ROS  negative psych ROS   GI/Hepatic negative GI ROS, Neg liver ROS, GERD-  Medicated and Controlled,  Endo/Other  negative endocrine ROSMorbid obesity  Renal/GU negative Renal ROS  negative genitourinary   Musculoskeletal   Abdominal   Peds  Hematology negative hematology ROS (+)   Anesthesia Other Findings   Reproductive/Obstetrics negative OB ROS                          Anesthesia Physical Anesthesia Plan  ASA: III  Anesthesia Plan: General   Post-op Pain Management:    Induction: Intravenous  Airway Management Planned: Oral ETT  Additional Equipment:   Intra-op Plan:   Post-operative Plan: Extubation in OR  Informed Consent: I have reviewed the patients History and Physical, chart, labs and discussed the procedure including the risks, benefits and alternatives for the proposed anesthesia with the patient or authorized representative who has indicated his/her understanding and acceptance.   Dental Advisory Given  Plan Discussed with: CRNA and Surgeon  Anesthesia Plan Comments:         Anesthesia Quick Evaluation

## 2011-04-09 NOTE — Anesthesia Postprocedure Evaluation (Signed)
  Anesthesia Post-op Note  Patient: Robyn [name]  Procedure(s) Performed: Procedure(s) (LRB): NEPHROLITHOTOMY PERCUTANEOUS (Right) URETEROSCOPY (Right)  Patient Location: PACU  Anesthesia Type: General  Level of Consciousness: awake and alert   Airway and Oxygen Therapy: Patient Spontanous Breathing  Post-op Pain: mild  Post-op Assessment: Post-op Vital signs reviewed, Patient's Cardiovascular Status Stable, Respiratory Function Stable, Patent Airway and No signs of Nausea or vomiting  Post-op Vital Signs: stable  Complications: No apparent anesthesia complications

## 2011-04-09 NOTE — Discharge Instructions (Signed)
DISCHARGE INSTRUCTIONS FOR PCNL  MEDICATIONS:  1. DO NOT RESUME YOUR IBUPROFEN, or any other medicines like aspirin, motrin, excedrin, advil, aleve, vitamin E, fish oil as these can all cause bleeding x 10 days.  2. Resume all your other meds from home - except do not take any other pain meds that you may have at home. ACTIVITY 1. No strenuous activity, sexual activity, or lifting greater than 10 pounds for 3 weeks. 2. No driving while on narcotic pain medications 3. Drink plenty of water 4. Continue to walk at home - you can still get blood clots when you are at home, so keep active, but don't over do it. 5. May return to work in 1 week (but not heavy or strenuous activity).  BATHING 1. You can shower and we recommend daily showers.  Cover your wound with a dressing and remove the dressing immediately after the shower.  Do not submerge wound under water.  WOUND CARE Your wound will drain bloody fluid and may do so for 7-14 days. You have 2 options for dressings:  1. You may use kerlex (rolled up gauze) and tape to dress your wound.  If you choose this method, then change the dressing as it becomes soaked.  Change it at least once daily until it stops draining. 2. You may use and ostomy device.  This is a bag with an andhesive circle.  The circle has a hole in the middle of it and you cut the hole to the size needed to fit the wound.  This will collect the drainage in the bag and allow you to drain the bag as needed.  SIGNS/SYMPTOMS TO CALL: 1. Please call us if you have a fever greater than 101.5, uncontrolled nausea/vomiting, uncontrolled pain, dizziness, unable to urinate, chest pain, shortness of breath, leg swelling, leg pain, redness around wound, drainage from wound, or any other concerns or questions. 2. You can reach [place] at [phone]. 3.

## 2011-04-09 NOTE — H&P (Signed)
Agree 

## 2011-04-09 NOTE — Progress Notes (Signed)
Hgb. And Hct. Results noted. 

## 2011-04-09 NOTE — Procedures (Signed)
Procedure:  Right perc ureteral cath insertion Findings:  Difficult access of lower pole collecting system at level of calculus.  Eventual mid-pole access with advancement of a 5 Fr catheter to the level of the bladder. Plan:  Operative nephrolithotomy with Dr. Jasmine December later today.

## 2011-04-10 ENCOUNTER — Inpatient Hospital Stay (HOSPITAL_COMMUNITY): Payer: 59

## 2011-04-10 LAB — BASIC METABOLIC PANEL
CO2: 25 mEq/L (ref 19–32)
GFR calc non Af Amer: 35 mL/min — ABNORMAL LOW (ref >90)
Glucose, Bld: 88 mg/dL (ref 70–99)
Potassium: 3.7 mEq/L (ref 3.5–5.1)
Sodium: 134 mEq/L — ABNORMAL LOW (ref 135–145)

## 2011-04-10 LAB — HEMOGLOBIN AND HEMATOCRIT, BLOOD
HCT: 31.2 % — ABNORMAL LOW (ref 36.0–46.0)
Hemoglobin: 10.8 g/dL — ABNORMAL LOW (ref 12.0–15.0)

## 2011-04-10 MED ORDER — OXYCODONE HCL 5 MG PO TABS: 5.0000 mg | ORAL_TABLET | ORAL | Status: AC | PRN

## 2011-04-10 MED ORDER — HYDRALAZINE HCL 25 MG PO TABS: 50.0000 mg | ORAL_TABLET | Freq: Three times a day (TID) | ORAL | Status: DC

## 2011-04-10 MED ORDER — HYOSCYAMINE SULFATE 0.125 MG SL SUBL: 0.1250 mg | SUBLINGUAL_TABLET | SUBLINGUAL | Status: DC | PRN

## 2011-04-10 MED ORDER — SENNOSIDES-DOCUSATE SODIUM 8.6-50 MG PO TABS: 1.0000 | ORAL_TABLET | Freq: Two times a day (BID) | ORAL | Status: DC

## 2011-04-10 NOTE — Progress Notes (Signed)
GU  Patient was unable to void this afternoon and had to have a catheter replaced.  Continues to tolerated capping of her nephrostomy tube.    Filed Vitals:   04/10/11 1606  BP: 124/83  Pulse: 67  Temp: 98.6 F (37 C)  Resp: 16   Gen: NAD Abd: soft GU: foley catheter with clear cherry red color  A/P: POD#1 right PCNL. -KUB shows not significant residual stone burden on the right. -Recheck renal function in the morning. -Will discontinue right nephrostomy tube tomorrow morning and try another voiding trial.

## 2011-04-10 NOTE — Progress Notes (Signed)
Urology Progress Note  Subjective:     No acute urologic events overnight. Pain controlled. No complaints. Ambulated last night.  ROS: Negative: chest pain  Objective:  Patient Vitals for the past 24 hrs:  BP Temp Temp src Pulse Resp SpO2 Height Weight  04/10/11 0607 116/78 mmHg 97.5 F (36.4 C) Oral 69  13  97 % - -  04/10/11 0251 116/72 mmHg 98.7 F (37.1 C) Oral 64  14  94 % - -  04/09/11 2133 137/89 mmHg 97.6 F (36.4 C) Oral 70  20  97 % - -  04/09/11 1900 - - - - - - 5\' 6"  (1.676 m) 125.011 kg (275 lb 9.6 oz)  04/09/11 1828 164/102 mmHg - - - - - - -  04/09/11 1810 173/122 mmHg - - - - - - -  04/09/11 1610 149/97 mmHg 97.9 F (36.6 C) Oral 76  20  96 % - -  04/09/11 1545 154/106 mmHg 97.8 F (36.6 C) - 76  18  94 % - -  04/09/11 1530 160/104 mmHg - - 76  17  94 % - -  04/09/11 1515 161/101 mmHg 97 F (36.1 C) - 74  17  99 % - -  04/09/11 1500 157/94 mmHg - - 71  16  97 % - -  04/09/11 1445 148/89 mmHg - - 61  9  100 % - -  04/09/11 1430 135/85 mmHg - - 58  16  100 % - -  04/09/11 1418 127/77 mmHg 98 F (36.7 C) - 60  12  100 % - -    Physical Exam: General:  No acute distress, awake Cardiovascular:    [x]   S1/S2 present, RRR  []   Irregularly irregular Chest:  CTA-B Abdomen:               []  Soft, appropriately TTP  [x]  Soft, NTTP  []  Soft, appropriately TTP, incision(s) clean/dry/intact  Genitourinary: Right nephrostomy with clear cherry urine. Foley:  Also with clear cherry urine.    I/O last 3 completed shifts: In: 2640 [P.O.:240; I.V.:2300; IV Piggyback:100] Out: 900 [Urine:700; Blood:200]  Recent Labs  [facility] 04/10/11 0443 04/09/11 1458   HGB 10.8* 11.6*   WBC -- --   PLT -- --    Recent Labs  [facility] 04/10/11 0443   NA 134*   K 3.7   CL 100   CO2 25   BUN 20   CREATININE 1.77*   CALCIUM 8.1*   GFRNONAA 35*   GFRAA 41*     No results found for this basename: PT:2,INR:2,APTT:2 in the last 72 hours   No components found with this  basename: ABG:2    Length of stay: 1 days.  Assessment: Nephrolithiasis POD#1 Right PCNL  Plan: -Decrease IV fluid to 50 ml/hr -Cap nephrostomy tube. -D/c foley -KUB this morning -Advance to regular diet -Will evaluate later today for possible d/c home.   Rolan Bucco, MD [phone]

## 2011-04-10 NOTE — Progress Notes (Signed)
Patient refused CPAP for tonight 

## 2011-04-10 NOTE — Op Note (Signed)
NAMEMarland [name]  URA, KEPHART NO.:  [id number]  MEDICAL RECORD NO.:  [id number]  LOCATION:  [id number]                         FACILITY:  [facility]  PHYSICIAN:  Rolan Bucco, MD    DATE OF BIRTH:  1970-09-28  DATE OF PROCEDURE:  04/09/2011 DATE OF DISCHARGE:                              OPERATIVE REPORT   SURGEON:  Rolan Bucco, MD  ASSISTANT:  None.  PREOPERATIVE DIAGNOSIS:  Right nephrolithiasis larger than 2 cm.  POSTOPERATIVE DIAGNOSIS:  Right nephrolithiasis larger than 2 cm.  PROCEDURE PERFORMED: 1. Right percutaneous nephrostolithotomy greater than 2 cm. 2. Dilation of right nephrostomy tube tract. 3. Right flexible nephroscopy. 4. Right antegrade pyelogram. 5. Placement of a right nephroureteral stent in an antegrade fashion     and placement of a right nephrostomy tube.  ESTIMATED BLOOD LOSS:  100 mL.  DRAINS:  Right nephrostomy tube, right nephroureteral stent, and Foley catheter.  FINDINGS:  Large staghorn calculus, larger than 2 cm in the lower collecting system.  COMPLICATIONS:  None.  SPECIMENS:  Stone sent to [facility] lab for chemical analysis.  HISTORY OF PRESENT ILLNESS:  This is a 41 year old female who presented to the clinic with a history of nephrolithiasis.  She had a CT scan showing bilateral nephrolithiasis with a large stone burden in the right lower pole.  After reviewing treatment options, she elected to have a percutaneous nephrostolithotomy on the right side and presented today for that elective procedure.  She had a percutaneous access obtained by Interventional Radiology earlier this same day of surgery.  PROCEDURE IN DETAIL:  After informed consent was obtained, the patient was taken to the operating room where she was placed in supine position. IV antibiotics were infused and general anesthesia was induced.  Foley catheter was placed using sterile technique on the field with return of grossly bloody urine.  This was  due to the placement of the nephrostomy tube by Interventional Radiology.  After this was done, the patient was placed in a prone position on the operating room table using large gel rolls to support her thorax.  Her knees were also supported with a gel sheet and her knees had a physiologic flex.  All neurovascular pressure points of the arms and upper body were padded appropriately, making sure that no lateral body tissues were crushed such as her breast and her abdomen.  After this was done, she was secured to place on the table and her right back was prepped and draped in the usual sterile fashion along with a nephrostomy tube.  A time-out was performed in which the correct patient, surgical site, and procedure were all identified and agreed upon by the team.  Following this, an Amplatz wire was placed down the nephroureteral stent into the bladder with good curl noted on fluoroscopy.  The nephrostomy tube was then removed and an incision was made in the skin and the fascia with a #15 blade scalpel.  Dual lumen access sheath was placed over the Amplatz wire and a second wire was placed down into the bladder, this was also an Amplatz wire.  One wire was secured as a safety wire.  The other was used as a working  wire. The nephrostomy dilating balloon was then placed over the working wire, with the tip in the collecting system, and it was inflated under direct fluoroscopy to 14 atmospheres and held for 3 minutes.  After this, the nephrostomy sheath was placed over the balloon with ease into position into the collecting system.  The balloon was deflated, removed, and then the working wire was also secured to the drape.  Rigid nephroscope was placed through the nephrostomy tube and the stone was immediately visible.  Lithotripsy was carried out with the lithoclast ultrasonic power.  Stone fragments were removed with this.  There were a few stone fragments remaining and these were grasped  with the prongs of the rigid grasper.  Next, flexible nephroscope was placed in and the kidney was explored.  There was one small piece remaining that was grasped with a stent grasper.  This was removed.  After this was done, it was felt that she had good stone clearance.  There was no other stone visible on fluoroscopy.  The 5-French open-ended ureteral catheter was placed over the safety wire down into the distal ureter and secured back to the drape and then a 20-French Council tip catheter was placed over the working wire through the sheath and I injected the contrast, which showed minimal extravasation with good contrast going antegrade down the ureter.  It showed the nephrostomy tube seated in a good position in the collecting system, and I inflated the balloon with 2 mL of sterile water.  The nephrostomy tube was sutured in place with silk suture and the nephroureteral catheter was sutured into place with silk suture as well.  The wires were removed.  This completed the procedure. Nephrostomy tube was placed to dependent drainage.  She was placed back in supine position.  Anesthesia was reversed.  She was taken to the PACU in a stable condition.  Stones that were removed will be sent to [facility] for chemical analysis.  She will be kept overnight for pain control and observation.          ______________________________ Rolan Bucco, MD     DW/MEDQ  D:  04/09/2011  T:  04/10/2011  Job:  [id number]

## 2011-04-11 LAB — BASIC METABOLIC PANEL
CO2: 27 mEq/L (ref 19–32)
Calcium: 8.6 mg/dL (ref 8.4–10.5)
GFR calc Af Amer: 53 mL/min — ABNORMAL LOW (ref >90)
Sodium: 138 mEq/L (ref 135–145)

## 2011-04-11 LAB — HEMOGLOBIN AND HEMATOCRIT, BLOOD
HCT: 32 % — ABNORMAL LOW (ref 36.0–46.0)
Hemoglobin: 10.7 g/dL — ABNORMAL LOW (ref 12.0–15.0)

## 2011-04-11 NOTE — Discharge Summary (Signed)
Physician Discharge Summary  Patient ID: Robyn [name] MRN: [id number] DOB/AGE: 1970/02/07 41 y.o.  Admit date: 04/09/2011 Discharge date: 04/11/2011  Admission Diagnoses: Right nephrolithiasis  Discharge Diagnoses:  Right nephrolithiasis  Discharged Condition: good  Hospital Course:  The patient was admitted following right percutaneous nephrostolithotomy for pain control observation. Her blood pressure remained high and her dose of hydralazine was increased from twice daily to every 8 hours. The patient failed a voiding trial on postoperative day #1. She was able tolerate capping of her nephrostomy tube but she was noted to have slight renal insufficiency. She was kept overnight for continued observation. The following day her renal function improved. Her right nephrostomy tube and nephroureteral stent were able to be removed without increase in pain. She is able to control her pain with oral medications. Her catheter was removed and she was able to void without difficulty. A urostomy device was placed over her nephrostomy site. She was able to be discharged home in stable condition. She was instructed to follow up with her primary care provider regarding her hypertension.  Consults: None  Significant Diagnostic Studies: radiology: KUB: Free of stone fragments on right.   Treatments: surgery: Right PCNL  Discharge Exam: Blood pressure 170/109, pulse 79, temperature 98.5 F (36.9 C), temperature source Oral, resp. rate 18, height 5\' 6"  (1.676 m), weight 125.011 kg (275 lb 9.6 oz), SpO2 96.00%. See PE from progress note on date of discharge.  Disposition: 01-Home or Self Care  Discharge Orders    Future Orders Please Complete By Expires   Discharge patient        Medication List  As of 04/11/2011 12:49 PM   STOP taking these medications         fish oil-omega-3 fatty acids 1000 MG capsule         TAKE these medications         ALPRAZolam 0.25 MG tablet   Commonly known as:  XANAX   Take 0.5 mg by mouth daily as needed. Anxiety      ALPRAZolam 0.5 MG tablet   Commonly known as: XANAX   Take 0.5 mg by mouth at bedtime as needed.      aMILoride 5 MG tablet   Commonly known as: MIDAMOR   Take 5 mg by mouth 2 (two) times daily.      carvedilol 25 MG tablet   Commonly known as: COREG   Take 25 mg by mouth 2 (two) times daily with a meal.      hydrALAZINE 25 MG tablet   Commonly known as: APRESOLINE   Take 2 tablets (50 mg total) by mouth 3 (three) times daily.      hyoscyamine 0.125 MG SL tablet   Commonly known as: LEVSIN SL   Take 1 tablet (0.125 mg total) by mouth every 4 (four) hours as needed.      lisinopril 40 MG tablet   Commonly known as: PRINIVIL,ZESTRIL   Take 40 mg by mouth every morning.      multivitamin with minerals tablet   Take 1 tablet by mouth daily.      omeprazole 20 MG capsule   Commonly known as: PRILOSEC   Take 20 mg by mouth daily as needed.      OVER THE COUNTER MEDICATION      oxyCODONE 5 MG immediate release tablet   Commonly known as: Oxy IR/ROXICODONE   Take 1-2 tablets (5-10 mg total) by mouth every 4 (four) hours as needed.  senna-docusate 8.6-50 MG per tablet   Commonly known as: Senokot-S   Take 1 tablet by mouth 2 (two) times daily.      vitamin B-12 500 MCG tablet   Commonly known as: CYANOCOBALAMIN   Take 500 mcg by mouth daily.      Vitamin D 400 UNITS capsule   Take 400 Units by mouth daily.      ZYRTEC PO   Take by mouth. IF NEEDED FOR ALLERGIES           Follow-up Information    Follow up with Tanda Rockers, PA on 04/23/2011. (1:45 pm)    Contact information:   [facility] [place]          Signed: Molli Hazard 04/11/2011, 12:49 PM

## 2011-04-11 NOTE — Progress Notes (Signed)
Urology Progress Note  Subjective:     No acute urologic events overnight. Pain controlled. Positive flatus. Had some right flank pain and her nephrostomy tube was placed back to drainage; narcotic relieved the pain but the drainage did not seem to change it much. I drained about 100cc.  ROS: Negative: chest pain  Objective:  Patient Vitals for the past 24 hrs:  BP Temp Temp src Pulse Resp SpO2  04/10/11 2257 140/40 mmHg 98.9 F (37.2 C) Oral 73  18  92 %  04/10/11 1606 124/83 mmHg 98.6 F (37 C) Oral 67  16  93 %  04/10/11 1059 137/91 mmHg 98.1 F (36.7 C) Oral 68  14  94 %    Physical Exam: General:  No acute distress, awake Cardiovascular:    [x]   S1/S2 present, RRR  []   Irregularly irregular Chest:  CTA-B Abdomen:               []  Soft, appropriately TTP  [x]  Soft, NTTP  []  Soft, appropriately TTP, incision(s) clean/dry/intact  Genitourinary: Right nephrostomy with clear cherry urine. Foley:  Also with clear cherry urine.    I/O last 3 completed shifts: In: [id number] [P.O.:960; I.V.:4100; IV Piggyback:400] Out: 2135 [Urine:1935; Blood:200]  Recent Labs  [facility] 04/11/11 0518 04/10/11 0443   HGB 10.7* 10.8*   WBC -- --   PLT -- --    Recent Labs  Basename 04/11/11 0518 04/10/11 0443   NA 138 134*   K 3.7 3.7   CL 101 100   CO2 27 25   BUN 16 20   CREATININE 1.42* 1.77*   CALCIUM 8.6 8.1*   GFRNONAA 46* 35*   GFRAA 53* 41*     No results found for this basename: PT:2,INR:2,APTT:2 in the last 72 hours   No components found with this basename: ABG:2    Length of stay: 2 days.  Assessment: Nephrolithiasis POD#2 Right PCNL. Acute renal dysfunction.  Plan: -Acute renal dysfunction resolving. -Saline lock IV  -D/c nephrostomy tube. -D/c foley -Place urostomy device over right flank wound. -Likely discharge home today.   Rolan Bucco, MD [phone]

## 2011-04-17 ENCOUNTER — Encounter (HOSPITAL_COMMUNITY): Payer: Self-pay | Admitting: Urology

## 2011-05-07 HISTORY — PX: OTHER SURGICAL HISTORY: SHX169

## 2011-05-28 ENCOUNTER — Other Ambulatory Visit: Payer: Self-pay | Admitting: Family Medicine

## 2011-05-30 ENCOUNTER — Telehealth: Payer: Self-pay

## 2011-07-23 ENCOUNTER — Ambulatory Visit: Payer: 59

## 2011-07-23 ENCOUNTER — Encounter: Payer: Self-pay | Admitting: Family Medicine

## 2011-07-23 ENCOUNTER — Ambulatory Visit (INDEPENDENT_AMBULATORY_CARE_PROVIDER_SITE_OTHER): Payer: 59 | Admitting: Family Medicine

## 2011-07-23 VITALS — BP 184/114 | HR 67 | Temp 97.9°F | Resp 16 | Ht 64.5 in | Wt 274.0 lb

## 2011-07-23 DIAGNOSIS — Z720 Tobacco use: Secondary | ICD-10-CM

## 2011-07-23 DIAGNOSIS — M79602 Pain in left arm: Secondary | ICD-10-CM

## 2011-07-23 DIAGNOSIS — F419 Anxiety disorder, unspecified: Secondary | ICD-10-CM

## 2011-07-23 DIAGNOSIS — M79609 Pain in unspecified limb: Secondary | ICD-10-CM

## 2011-07-23 DIAGNOSIS — F411 Generalized anxiety disorder: Secondary | ICD-10-CM

## 2011-07-23 DIAGNOSIS — I10 Essential (primary) hypertension: Secondary | ICD-10-CM

## 2011-07-23 DIAGNOSIS — F17200 Nicotine dependence, unspecified, uncomplicated: Secondary | ICD-10-CM

## 2011-07-23 MED ORDER — ALPRAZOLAM 0.5 MG PO TABS: 0.5000 mg | ORAL_TABLET | Freq: Every evening | ORAL | Status: DC | PRN

## 2011-07-23 MED ORDER — BUPROPION HCL ER (SR) 150 MG PO TB12: 150.0000 mg | ORAL_TABLET | Freq: Two times a day (BID) | ORAL | Status: DC

## 2011-07-23 NOTE — Progress Notes (Signed)
  Subjective:    Patient ID: Robyn [name], female    DOB: Apr 26, 1970, 41 y.o.   MRN: [id number]  HPI This 41 y.o. AA female has malignant HTN ; medication management and monitoring is with a local  Nephrologist . She c/o arm numbness and tingling for several days with cramping in her fingers.  She also has neck and left anterior chest discomfort; these symptoms are not accompanied by  diaphoresis, jaw pain, SOB, nausea or vomiting. She is an everyday smoker and is seriously considering  cessation.     Review of Systems  Constitutional: Positive for fatigue. Negative for fever, diaphoresis, activity change and appetite change.  Respiratory: Positive for chest tightness. Negative for cough and shortness of breath.   Cardiovascular: Positive for palpitations. Negative for chest pain and leg swelling.  Gastrointestinal: Negative for nausea, vomiting and abdominal pain.  Neurological: Positive for light-headedness, numbness and headaches. Negative for dizziness, syncope, speech difficulty and weakness.  Psychiatric/Behavioral: Positive for disturbed wake/sleep cycle. Negative for suicidal ideas, dysphoric mood, decreased concentration and agitation. The patient is nervous/anxious.        Objective:   Physical Exam  Nursing note and vitals reviewed. Constitutional: She is oriented to person, place, and time. She appears well-developed and well-nourished. No distress.  HENT:  Head: Normocephalic and atraumatic.  Right Ear: External ear normal.  Left Ear: External ear normal.  Mouth/Throat: Oropharynx is clear and moist.  Eyes: Conjunctivae and EOM are normal. No scleral icterus.  Neck:       Cervical spine: tender posteriorly lateral to spinous processes;+ muscle spasms  Cardiovascular: Normal rate, regular rhythm and normal heart sounds.   No murmur heard. Pulmonary/Chest: Effort normal and breath sounds normal. No respiratory distress. She has no wheezes.  Musculoskeletal: Normal  range of motion. She exhibits no edema.  Neurological: She is alert and oriented to person, place, and time. No cranial nerve deficit. Coordination normal.       DTRs: 0-1+ in upper ext (difficult to illicit)  Skin: Skin is warm and dry.  Psychiatric: She has a normal mood and affect. Her behavior is normal. Thought content normal.    ECG: Sinus rhythm with LVH (prolonged QT interval)   [facility] reading (PRIMARY) by  Dr. Leward Quan: Cervical spine- loss of lordosis; early degenerative disc  diseasat lower levels with loss of disc height and spurring. Spasms in muscles noted.         Assessment & Plan:   1. HTN (hypertension), malignant  EKG 12-Lead Continue current medications and follow-up with Nephrologist as scheduled   2. 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 Anxiety disorder  Continue current medications; RF Alprazolam  4. Tobacco user  Encouraged cessation or try to limit use RX: Bupropion SR (Wellbutrin SR) 150 mg- Pt to pick QUIT DATE within 2 weeks of start of medication

## 2011-07-23 NOTE — Patient Instructions (Signed)
Smoking Cessation This document explains the best ways for you to quit smoking and new treatments to help. It lists new medicines that can double or triple your chances of quitting and quitting for good. It also considers ways to avoid relapses and concerns you may have about quitting, including weight gain. NICOTINE: A POWERFUL ADDICTION If you have tried to quit smoking, you know how hard it can be. It is hard because nicotine is a very addictive drug. For some people, it can be as addictive as heroin or cocaine. Usually, people make 2 or 3 tries, or more, before finally being able to quit. Each time you try to quit, you can learn about what helps and what hurts. Quitting takes hard work and a lot of effort, but you can quit smoking. QUITTING SMOKING IS ONE OF THE MOST IMPORTANT THINGS YOU WILL EVER DO.  You will live longer, feel better, and live better.   The impact on your body of quitting smoking is felt almost immediately:   Within 20 minutes, blood pressure decreases. Pulse returns to its normal level.   After 8 hours, carbon monoxide levels in the blood return to normal. Oxygen level increases.   After 24 hours, chance of heart attack starts to decrease. Breath, hair, and body stop smelling like smoke.   After 48 hours, damaged nerve endings begin to recover. Sense of taste and smell improve.   After 72 hours, the body is virtually free of nicotine. Bronchial tubes relax and breathing becomes easier.   After 2 to 12 weeks, lungs can hold more air. Exercise becomes easier and circulation improves.   Quitting will reduce your risk of having a heart attack, stroke, cancer, or lung disease:   After 1 year, the risk of coronary heart disease is cut in half.   After 5 years, the risk of stroke falls to the same as a nonsmoker.   After 10 years, the risk of lung cancer is cut in half and the risk of other cancers decreases significantly.   After 15 years, the risk of coronary heart  disease drops, usually to the level of a nonsmoker.   If you are pregnant, quitting smoking will improve your chances of having a healthy baby.   The people you live with, especially your children, will be healthier.   You will have extra money to spend on things other than cigarettes.  FIVE KEYS TO QUITTING Studies have shown that these 5 steps will help you quit smoking and quit for good. You have the best chances of quitting if you use them together: 1. Get ready.  2. Get support and encouragement.  3. Learn new skills and behaviors.  4. Get medicine to reduce your nicotine addiction and use it correctly.  5. Be prepared for relapse or difficult situations. Be determined to continue trying to quit, even if you do not succeed at first.  1. GET READY  Set a quit date.   Change your environment.   Get rid of ALL cigarettes, ashtrays, matches, and lighters in your home, car, and place of work.   Do not let people smoke in your home.   Review your past attempts to quit. Think about what worked and what did not.   Once you quit, do not smoke. NOT EVEN A PUFF!  2. GET SUPPORT AND ENCOURAGEMENT Studies have shown that you have a better chance of being successful if you have help. You can get support in many ways.  Tell   your family, friends, and coworkers that you are going to quit and need their support. Ask them not to smoke around you.   Talk to your caregivers (doctor, dentist, nurse, pharmacist, psychologist, and/or smoking counselor).   Get individual, group, or telephone counseling and support. The more counseling you have, the better your chances are of quitting. Programs are available at local hospitals and health centers. Call your local health department for information about programs in your area.   Spiritual beliefs and practices may help some smokers quit.   Quit meters are small computer programs online or downloadable that keep track of quit statistics, such as amount  of "quit-time," cigarettes not smoked, and money saved.   Many smokers find one or more of the many self-help books available useful in helping them quit and stay off tobacco.  3. LEARN NEW SKILLS AND BEHAVIORS  Try to distract yourself from urges to smoke. Talk to someone, go for a walk, or occupy your time with a task.   When you first try to quit, change your routine. Take a different route to work. Drink tea instead of coffee. Eat breakfast in a different place.   Do something to reduce your stress. Take a hot bath, exercise, or read a book.   Plan something enjoyable to do every day. Reward yourself for not smoking.   Explore interactive web-based programs that specialize in helping you quit.  4. GET MEDICINE AND USE IT CORRECTLY Medicines can help you stop smoking and decrease the urge to smoke. Combining medicine with the above behavioral methods and support can quadruple your chances of successfully quitting smoking. The [place] Food and Drug Administration (FDA) has approved 7 medicines to help you quit smoking. These medicines fall into 3 categories.  Nicotine replacement therapy (delivers nicotine to your body without the negative effects and risks of smoking):   Nicotine gum: Available over-the-counter.   Nicotine lozenges: Available over-the-counter.   Nicotine inhaler: Available by prescription.   Nicotine nasal spray: Available by prescription.   Nicotine skin patches (transdermal): Available by prescription and over-the-counter.   Antidepressant medicine (helps people abstain from smoking, but how this works is unknown):   Bupropion sustained-release (SR) tablets: Available by prescription.   Nicotinic receptor partial agonist (simulates the effect of nicotine in your brain):   Varenicline tartrate tablets: Available by prescription.   Ask your caregiver for advice about which medicines to use and how to use them. Carefully read the information on the package.    Everyone who is trying to quit may benefit from using a medicine. If you are pregnant or trying to become pregnant, nursing an infant, you are under age 18, or you smoke fewer than 10 cigarettes per day, talk to your caregiver before taking any nicotine replacement medicines.   You should stop using a nicotine replacement product and call your caregiver if you experience nausea, dizziness, weakness, vomiting, fast or irregular heartbeat, mouth problems with the lozenge or gum, or redness or swelling of the skin around the patch that does not go away.   Do not use any other product containing nicotine while using a nicotine replacement product.   Talk to your caregiver before using these products if you have diabetes, heart disease, asthma, stomach ulcers, you had a recent heart attack, you have high blood pressure that is not controlled with medicine, a history of irregular heartbeat, or you have been prescribed medicine to help you quit smoking.  5. BE PREPARED FOR RELAPSE OR   DIFFICULT SITUATIONS  Most relapses occur within the first 3 months after quitting. Do not be discouraged if you start smoking again. Remember, most people try several times before they finally quit.   You may have symptoms of withdrawal because your body is used to nicotine. You may crave cigarettes, be irritable, feel very hungry, cough often, get headaches, or have difficulty concentrating.   The withdrawal symptoms are only temporary. They are strongest when you first quit, but they will go away within 10 to 14 days.  Here are some difficult situations to watch for:  Alcohol. Avoid drinking alcohol. Drinking lowers your chances of successfully quitting.   Caffeine. Try to reduce the amount of caffeine you consume. It also lowers your chances of successfully quitting.   Other smokers. Being around smoking can make you want to smoke. Avoid smokers.   Weight gain. Many smokers will gain weight when they quit, usually  less than 10 pounds. Eat a healthy diet and stay active. Do not let weight gain distract you from your main goal, quitting smoking. Some medicines that help you quit smoking may also help delay weight gain. You can always lose the weight gained after you quit.   Bad mood or depression. There are a lot of ways to improve your mood other than smoking.  If you are having problems with any of these situations, talk to your caregiver. SPECIAL SITUATIONS AND CONDITIONS Studies suggest that everyone can quit smoking. Your situation or condition can give you a special reason to quit.  Pregnant women/new mothers: By quitting, you protect your baby's health and your own.   Hospitalized patients: By quitting, you reduce health problems and help healing.   Heart attack patients: By quitting, you reduce your risk of a second heart attack.   Lung, head, and neck cancer patients: By quitting, you reduce your chance of a second cancer.   Parents of children and adolescents: By quitting, you protect your children from illnesses caused by secondhand smoke.  QUESTIONS TO THINK ABOUT Think about the following questions before you try to stop smoking. You may want to talk about your answers with your caregiver.  Why do you want to quit?   If you tried to quit in the past, what helped and what did not?   What will be the most difficult situations for you after you quit? How will you plan to handle them?   Who can help you through the tough times? Your family? Friends? Caregiver?   What pleasures do you get from smoking? What ways can you still get pleasure if you quit?  Here are some questions to ask your caregiver:  How can you help me to be successful at quitting?   What medicine do you think would be best for me and how should I take it?   What should I do if I need more help?   What is smoking withdrawal like? How can I get information on withdrawal?  Quitting takes hard work and a lot of effort,  but you can quit smoking. FOR MORE INFORMATION  Smokefree.gov (http://www.smokefree.gov) provides free, accurate, evidence-based information and professional assistance to help support the immediate and long-term needs of people trying to quit smoking. Document Released: 12/17/2000 Document Revised: 12/12/2010 Document Reviewed: 10/09/2008 [employer] Patient Information 2012 [employer]. 

## 2011-07-29 ENCOUNTER — Encounter: Payer: Self-pay | Admitting: Family Medicine

## 2011-07-29 DIAGNOSIS — Z720 Tobacco use: Secondary | ICD-10-CM | POA: Insufficient documentation

## 2011-07-29 DIAGNOSIS — F419 Anxiety disorder, unspecified: Secondary | ICD-10-CM | POA: Insufficient documentation

## 2011-07-29 DIAGNOSIS — I10 Essential (primary) hypertension: Secondary | ICD-10-CM | POA: Insufficient documentation

## 2011-08-18 ENCOUNTER — Ambulatory Visit: Payer: Self-pay | Admitting: Obstetrics and Gynecology

## 2011-08-22 ENCOUNTER — Encounter (HOSPITAL_COMMUNITY): Payer: Self-pay | Admitting: Emergency Medicine

## 2011-08-22 ENCOUNTER — Inpatient Hospital Stay (HOSPITAL_COMMUNITY)
Admission: EM | Admit: 2011-08-22 | Discharge: 2011-08-24 | DRG: 684 | Disposition: A | Discharge status: Home / Self Care | Payer: 59 | Attending: Internal Medicine | Admitting: Internal Medicine

## 2011-08-22 DIAGNOSIS — D631 Anemia in chronic kidney disease: Secondary | ICD-10-CM | POA: Diagnosis present

## 2011-08-22 DIAGNOSIS — D72829 Elevated white blood cell count, unspecified: Secondary | ICD-10-CM | POA: Diagnosis present

## 2011-08-22 DIAGNOSIS — I10 Essential (primary) hypertension: Secondary | ICD-10-CM | POA: Diagnosis present

## 2011-08-22 DIAGNOSIS — E876 Hypokalemia: Secondary | ICD-10-CM | POA: Diagnosis present

## 2011-08-22 DIAGNOSIS — F419 Anxiety disorder, unspecified: Secondary | ICD-10-CM

## 2011-08-22 DIAGNOSIS — F411 Generalized anxiety disorder: Secondary | ICD-10-CM | POA: Diagnosis present

## 2011-08-22 DIAGNOSIS — D638 Anemia in other chronic diseases classified elsewhere: Secondary | ICD-10-CM | POA: Diagnosis present

## 2011-08-22 DIAGNOSIS — Z720 Tobacco use: Secondary | ICD-10-CM

## 2011-08-22 DIAGNOSIS — N183 Chronic kidney disease, stage 3 unspecified: Secondary | ICD-10-CM | POA: Diagnosis present

## 2011-08-22 DIAGNOSIS — N184 Chronic kidney disease, stage 4 (severe): Secondary | ICD-10-CM | POA: Diagnosis present

## 2011-08-22 DIAGNOSIS — N289 Disorder of kidney and ureter, unspecified: Secondary | ICD-10-CM | POA: Diagnosis present

## 2011-08-22 DIAGNOSIS — I129 Hypertensive chronic kidney disease with stage 1 through stage 4 chronic kidney disease, or unspecified chronic kidney disease: Principal | ICD-10-CM | POA: Diagnosis present

## 2011-08-22 LAB — MAGNESIUM
Magnesium: 1.7 mg/dL (ref 1.5–2.5)
Magnesium: 1.6 mg/dL (ref 1.5–2.5)

## 2011-08-22 LAB — DIFFERENTIAL
Basophils Absolute: 0 10*3/uL (ref 0.0–0.1)
Eosinophils Absolute: 0.4 10*3/uL (ref 0.0–0.7)
Eosinophils Relative: 5 % (ref 0–5)
Lymphocytes Relative: 29 % (ref 12–46)
Lymphs Abs: 2.3 10*3/uL (ref 0.7–4.0)
Neutrophils Relative %: 60 % (ref 43–77)

## 2011-08-22 LAB — CBC
MCH: 32.7 pg (ref 26.0–34.0)
MCV: 92.6 fL (ref 78.0–100.0)
Platelets: 221 10*3/uL (ref 150–400)
RBC: 3.52 MIL/uL — ABNORMAL LOW (ref 3.87–5.11)
RDW: 12.7 % (ref 11.5–15.5)
WBC: 8.1 10*3/uL (ref 4.0–10.5)

## 2011-08-22 LAB — POCT I-STAT TROPONIN I: Troponin i, poc: 0.01 ng/mL (ref 0.00–0.08)

## 2011-08-22 LAB — APTT: aPTT: 35 seconds (ref 24–37)

## 2011-08-22 LAB — POCT I-STAT, CHEM 8
BUN: 20 mg/dL (ref 6–23)
Chloride: 99 mEq/L (ref 96–112)
Creatinine, Ser: 1.7 mg/dL — ABNORMAL HIGH (ref 0.50–1.10)
Glucose, Bld: 105 mg/dL — ABNORMAL HIGH (ref 70–99)
Potassium: 2.6 mEq/L — CL (ref 3.5–5.1)
Sodium: 140 mEq/L (ref 135–145)

## 2011-08-22 LAB — PROTIME-INR: Prothrombin Time: 12.7 seconds (ref 11.6–15.2)

## 2011-08-22 MED ORDER — CLONIDINE HCL 0.1 MG PO TABS
0.2000 mg | ORAL_TABLET | Freq: Once | ORAL | Status: AC
  Administered 2011-08-22: 0.2 mg via ORAL
  Filled 2011-08-22: qty 2

## 2011-08-22 MED ORDER — CLONIDINE HCL 0.1 MG PO TABS
0.1000 mg | ORAL_TABLET | Freq: Once | ORAL | Status: AC
  Administered 2011-08-22: 0.1 mg via ORAL
  Filled 2011-08-22: qty 1

## 2011-08-22 MED ORDER — POTASSIUM CHLORIDE 10 MEQ/100ML IV SOLN
10.0000 meq | Freq: Once | INTRAVENOUS | Status: AC
  Administered 2011-08-22: 10 meq via INTRAVENOUS
  Filled 2011-08-22: qty 100

## 2011-08-22 MED ORDER — BUPROPION HCL ER (SR) 150 MG PO TB12
150.0000 mg | ORAL_TABLET | Freq: Two times a day (BID) | ORAL | Status: DC
  Administered 2011-08-22 – 2011-08-24 (×4): 150 mg via ORAL
  Filled 2011-08-22 (×5): qty 1

## 2011-08-22 MED ORDER — ASPIRIN 325 MG PO TABS
325.0000 mg | ORAL_TABLET | Freq: Every day | ORAL | Status: DC
  Administered 2011-08-23 – 2011-08-24 (×2): 325 mg via ORAL
  Filled 2011-08-22 (×2): qty 1

## 2011-08-22 MED ORDER — HYDRALAZINE HCL 50 MG PO TABS
50.0000 mg | ORAL_TABLET | Freq: Three times a day (TID) | ORAL | Status: DC
  Administered 2011-08-22 – 2011-08-23 (×2): 50 mg via ORAL
  Filled 2011-08-22 (×4): qty 1

## 2011-08-22 MED ORDER — SODIUM CHLORIDE 0.9 % IJ SOLN
3.0000 mL | Freq: Two times a day (BID) | INTRAMUSCULAR | Status: DC
  Administered 2011-08-22 – 2011-08-24 (×3): 3 mL via INTRAVENOUS

## 2011-08-22 MED ORDER — ALPRAZOLAM 0.5 MG PO TABS
0.5000 mg | ORAL_TABLET | Freq: Every evening | ORAL | Status: DC | PRN
  Administered 2011-08-22 – 2011-08-23 (×2): 0.5 mg via ORAL
  Filled 2011-08-22 (×2): qty 1

## 2011-08-22 MED ORDER — ADULT MULTIVITAMIN W/MINERALS CH
1.0000 | ORAL_TABLET | Freq: Every day | ORAL | Status: DC
  Administered 2011-08-23 – 2011-08-24 (×2): 1 via ORAL
  Filled 2011-08-22 (×2): qty 1

## 2011-08-22 MED ORDER — CYANOCOBALAMIN 500 MCG PO TABS
500.0000 ug | ORAL_TABLET | Freq: Every day | ORAL | Status: DC
  Administered 2011-08-23 – 2011-08-24 (×2): 500 ug via ORAL
  Filled 2011-08-22 (×2): qty 1

## 2011-08-22 MED ORDER — POTASSIUM CHLORIDE CRYS ER 20 MEQ PO TBCR
20.0000 meq | EXTENDED_RELEASE_TABLET | Freq: Once | ORAL | Status: AC
  Administered 2011-08-22: 20 meq via ORAL
  Filled 2011-08-22: qty 1

## 2011-08-22 MED ORDER — ONDANSETRON HCL 4 MG PO TABS: 4.0000 mg | ORAL_TABLET | Freq: Four times a day (QID) | ORAL | Status: DC | PRN

## 2011-08-22 MED ORDER — ONDANSETRON HCL 4 MG/2ML IJ SOLN: 4.0000 mg | Freq: Four times a day (QID) | INTRAMUSCULAR | Status: DC | PRN

## 2011-08-22 MED ORDER — HYDROCODONE-ACETAMINOPHEN 5-325 MG PO TABS
1.0000 | ORAL_TABLET | ORAL | Status: DC | PRN
  Administered 2011-08-22: 2 via ORAL
  Administered 2011-08-24: 1 via ORAL
  Filled 2011-08-22: qty 1
  Filled 2011-08-22: qty 2

## 2011-08-22 MED ORDER — CLONIDINE HCL 0.2 MG PO TABS
0.2000 mg | ORAL_TABLET | Freq: Three times a day (TID) | ORAL | Status: DC
  Administered 2011-08-22 – 2011-08-24 (×5): 0.2 mg via ORAL
  Filled 2011-08-22 (×3): qty 1
  Filled 2011-08-22: qty 2
  Filled 2011-08-22 (×3): qty 1

## 2011-08-22 MED ORDER — VITAMIN D3 25 MCG (1000 UNIT) PO TABS
1000.0000 [IU] | ORAL_TABLET | Freq: Every day | ORAL | Status: DC
  Administered 2011-08-23 – 2011-08-24 (×2): 1000 [IU] via ORAL
  Filled 2011-08-22 (×2): qty 1

## 2011-08-22 MED ORDER — AMILORIDE HCL 5 MG PO TABS
5.0000 mg | ORAL_TABLET | Freq: Two times a day (BID) | ORAL | Status: DC
  Administered 2011-08-22 – 2011-08-24 (×4): 5 mg via ORAL
  Filled 2011-08-22 (×5): qty 1

## 2011-08-22 NOTE — ED Notes (Signed)
Attempted to call report to floor, Debroah Baller , RN will call back.

## 2011-08-22 NOTE — ED Provider Notes (Signed)
History     CSN: [id number]  Arrival date & time 08/22/11  1719   First MD Initiated Contact with Patient 08/22/11 1834      Chief Complaint  Patient presents with  . Hypertension    HPI  Pt reports that she went to her urologist for a scheduled appointment and her BP was 186/135. Urologist told her to come to the ED. Pt reports that she is scheduled to have kidney surgery in 4 weeks. Pt reports that her left arm is tingling. Pt denies numbness. Pt c/o of a headache with blurred vision as well. Pt denies n/v/d. Pt reports," my heart feels like its skipping a beat."       Past Medical History  Diagnosis Date  . Hypertension   . Shortness of breath     ONLY WITH ANXIETY  . Anxiety     OCCAS PANIC ATTACKS  . Blood transfusion 1990'S  . Kidney stones   . GERD (gastroesophageal reflux disease)     OCCAS REFLUX-WOULD TAKE OMEPRAZOLE IF NEEDED  . Sleep apnea     PT USES CPAP SOMETIMES - SETTING IS 3  . Hypokalemia     PAST HX  . Abnormal Pap smear   . H/O varicella   . Pregnancy induced hypertension   . Preterm labor   . Depression 2010  . Weight loss 2010  . Urgency incontinence 2010  . Increased BMI   . H/O dysmenorrhea   . Ovarian cyst 2011  . Fibroid 2011  . H/O: menorrhagia 2011  . BV (bacterial vaginosis)   . Perimenopausal symptoms 07/15/2010    Past Surgical History  Procedure Date  . Percutaneous nephrostomy around 1996   . Uterine ablation march 2010   . C-sections x 2   . Cholecystectomy 1993  . Nephrolithotomy 04/09/2011    Procedure: NEPHROLITHOTOMY PERCUTANEOUS;  Surgeon: Molli Hazard, MD;  Location: [facility];  Service: Urology;  Laterality: Right;      . Ureteroscopy 04/09/2011    Procedure: URETEROSCOPY;  Surgeon: Molli Hazard, MD;  Location: [facility];  Service: Urology;  Laterality: Right;  . Tubal ligation 1999    History reviewed. No pertinent family history.  History  Substance Use Topics  . Smoking status: Current Everyday  Smoker -- 0.2 packs/day for 15 years    Types: Cigarettes  . Smokeless tobacco: Never Used  . Alcohol Use: Yes     OCCAS    OB History    Grav Para Term Preterm Abortions TAB SAB Ect Mult Living   2 2 2       2       Review of Systems  All other systems reviewed and are negative.    Allergies  Sulfa antibiotics  Home Medications   No current outpatient prescriptions on file.  BP 157/92  Pulse 67  Temp 98 F (36.7 C) (Oral)  Resp 18  Ht 5\' 10"  (1.778 m)  Wt 278 lb 9.6 oz (126.372 kg)  BMI 39.97 kg/m2  SpO2 100%  Physical Exam  Nursing note and vitals reviewed. Constitutional: She is oriented to person, place, and time. She appears well-developed. No distress.  HENT:  Head: Normocephalic and atraumatic.  Eyes: Pupils are equal, round, and reactive to light.  Neck: Normal range of motion.  Cardiovascular: Normal rate and intact distal pulses.        Sinus rhythm Rate = 73 Nonspecific ST-T changes with T-wave abnormalities in inferior lateral leads. Findings consistent with LVH  Abnormal EKG  Pulmonary/Chest: No respiratory distress.  Abdominal: Normal appearance. She exhibits no distension.  Musculoskeletal: Normal range of motion.  Neurological: She is alert and oriented to person, place, and time. No cranial nerve deficit.  Skin: Skin is warm and dry. No rash noted.  Psychiatric: She has a normal mood and affect. Her behavior is normal.    ED Course  Procedures (including critical care time) Scheduled Meds:    . aMILoride  5 mg Oral BID  . aspirin  325 mg Oral Daily  . buPROPion  150 mg Oral BID  . cholecalciferol  1,000 Units Oral Daily  . cloNIDine  0.2 mg Oral TID  . cyanocobalamin  500 mcg Oral Daily  . hydrALAZINE  100 mg Oral TID  . multivitamin with minerals  1 tablet Oral Daily  . potassium chloride  40 mEq Oral Q4H  . sodium chloride  3 mL Intravenous Q12H  . DISCONTD: hydrALAZINE  50 mg Oral TID   Continuous Infusions:  PRN  Meds:.ALPRAZolam, HYDROcodone-acetaminophen, ondansetron (ZOFRAN) IV, ondansetron  Labs Reviewed  POCT I-STAT, CHEM 8 - Abnormal; Notable for the following:    Potassium 2.6 (*)     Creatinine, Ser 1.70 (*)     Glucose, Bld 105 (*)     Calcium, Ion 1.11 (*)     Hemoglobin 11.9 (*)     HCT 35.0 (*)     All other components within normal limits  COMPREHENSIVE METABOLIC PANEL - Abnormal; Notable for the following:    Potassium 2.5 (*)     Chloride 94 (*)     Glucose, Bld 142 (*)     Creatinine, Ser 1.30 (*)     Albumin 3.2 (*)     GFR calc non Af Amer 51 (*)     GFR calc Af Amer 59 (*)     All other components within normal limits  CBC - Abnormal; Notable for the following:    RBC 3.52 (*)     Hemoglobin 11.5 (*)     HCT 32.6 (*)     All other components within normal limits  BASIC METABOLIC PANEL - Abnormal; Notable for the following:    Potassium 2.9 (*)     Glucose, Bld 115 (*)     Creatinine, Ser 1.27 (*)     GFR calc non Af Amer 52 (*)     GFR calc Af Amer 60 (*)     All other components within normal limits  BASIC METABOLIC PANEL - Abnormal; Notable for the following:    Potassium 3.1 (*)     CO2 33 (*)     Glucose, Bld 114 (*)     Creatinine, Ser 1.15 (*)     GFR calc non Af Amer 59 (*)     GFR calc Af Amer 68 (*)     All other components within normal limits  POCT I-STAT TROPONIN I  MAGNESIUM  MAGNESIUM  PHOSPHORUS  DIFFERENTIAL  APTT  PROTIME-INR  TSH   No results found.   1. Uncontrolled hypertension   2. Hypokalemia   3. Accelerated hypertension   4. Anemia of chronic disease   5. CKD (chronic kidney disease), stage III       MDM          Robyn Lanes, MD 08/24/11 [phone]

## 2011-08-22 NOTE — ED Notes (Signed)
Pt reports that she went to her urologist for a scheduled appointment and her BP was 186/135. Urologist told her to come to the ED. Pt reports that she is scheduled to have kidney surgery in 4 weeks. Pt reports that her left arm is tingling. Pt denies numbness. Pt c/o of a headache with blurred vision as well. Pt denies n/v/d. Pt reports," my heart feels like its skipping a beat."

## 2011-08-22 NOTE — ED Notes (Signed)
Dr. Audie Pinto made aware of critical I-stat, Chem 8 value.

## 2011-08-22 NOTE — H&P (Signed)
[facility] History and Physical  Robyn [name] [id number] DOB: 1970/03/31 DOA: 08/22/2011  Referring physician: ER physician PCP: [name], MD   Chief Complaint: high blood pressure  HPI:  41 year old female with history of malignant hypertension, anxiety, CKD who was sent to ED from urologist office for elevated blood pressure 186/135. Patient reported having tingling sensation in her arm and associated headaches, constant, throbbing as well as blurry vision but no complaints of chest pain, no shortness of breath, no fever. No complaints of lightheadedness or dizziness or loss of consciousness. NO complaints of abdominal pain, nausea or vomiting.  Assessment and Plan:  Principal Problem:  *Accelerated hypertension, malignant hypertension - patient has a history of malignant hypertension and difficulty controlling BP - not clear based on EPIC records if work up has been done to rule out secondary hypertension, renal artery stenosis - secondary to questionable compliance and rebound hypertension - will restart amiloride, hydralazine  - clonidine has been started IN ED and BP is down from 222/128 to 165/111; would continue 0.2 mg TID - hold lisinopril due to worsening kidney function (creatinine increased from 1.4 to 1.7 from 04/2011 to 1.7 on this admission)  Active Problems:  Leukocytosis - likely reactive, stress demargination - follow up CBC admission labs   Anemia of chronic disease - secondary to hypertensive nephropathy - hemoglobin better than baseline, 11.9 - continue to monitor - no signs of active bleed   Hypokalemia - repleted in ED - follow up BMP in am   Acute on chronic kidney disease, stage III - creatinine increased to 1.7 from what appears to be baseline of 1.4 in 04/2011 - follow up admission labs   Anxiety disorder - continue xanax and Wellbutrin  DIET - Heart healthy  DVT prophylaxis - SCD bialterally  Code Status:  Full Family Communication: Pt at bedside Disposition Plan: Admit for further evaluation  [name], MD [facility] [place] Pager [phone]  If 7PM-7AM, please contact night-coverage [email or web address] Password [email or web address] 08/22/2011, 8:49 PM   Review of Systems:   Constitutional: Negative for fever, chills and malaise/fatigue. Negative for diaphoresis.  HENT: Negative for hearing loss, ear pain, nosebleeds, congestion, sore throat, neck pain, tinnitus and ear discharge.   Eyes: positive  for blurred vision, double vision, nophotophobia, pain, discharge and redness.  Respiratory: Negative for cough, hemoptysis, sputum production, shortness of breath, wheezing and stridor.   Cardiovascular: positive for palpitations, negative for chest pain Gastrointestinal: Negative for nausea, vomiting and abdominal pain. Negative for heartburn, constipation, blood in stool and melena.  Genitourinary: Negative for dysuria, urgency, frequency, hematuria and flank pain.  Musculoskeletal: Negative for myalgias, back pain, joint pain and falls.  Skin: Negative for itching and rash.  Neurological: Negative for dizziness and weakness. Positive  for tingling, negative for tremors, sensory change, speech change, focal weakness, loss of consciousness and headaches.  Endo/Heme/Allergies: Negative for environmental allergies and polydipsia. Does not bruise/bleed easily.  Psychiatric/Behavioral: Negative for suicidal ideas. The patient is not nervous/anxious.      Past Medical History  Diagnosis Date  . Hypertension   . Shortness of breath     ONLY WITH ANXIETY  . Anxiety     OCCAS PANIC ATTACKS  . Blood transfusion 1990'S  . Kidney stones   . GERD (gastroesophageal reflux disease)     OCCAS REFLUX-WOULD TAKE OMEPRAZOLE IF NEEDED  . Sleep apnea     PT USES CPAP SOMETIMES - SETTING IS 3  . Hypokalemia     PAST HX  .  Abnormal Pap smear   . H/O varicella   . Pregnancy induced hypertension   . Preterm labor   . Depression 2010   . Weight loss 2010  . Urgency incontinence 2010  . Increased BMI   . H/O dysmenorrhea   . Ovarian cyst 2011  . Fibroid 2011  . H/O: menorrhagia 2011  . BV (bacterial vaginosis)   . Perimenopausal symptoms 07/15/2010   Past Surgical History  Procedure Date  . Percutaneous nephrostomy around 1996   . Uterine ablation march 2010   . C-sections x 2   . Cholecystectomy 1993  . Nephrolithotomy 04/09/2011    Procedure: NEPHROLITHOTOMY PERCUTANEOUS;  Surgeon: Molli Hazard, MD;  Location: [facility];  Service: Urology;  Laterality: Right;      . Ureteroscopy 04/09/2011    Procedure: URETEROSCOPY;  Surgeon: Molli Hazard, MD;  Location: [facility];  Service: Urology;  Laterality: Right;  . Tubal ligation 1999   Social History:  reports that she has been smoking Cigarettes.  She has a 3.75 pack-year smoking history. She has never used smokeless tobacco. She reports that she drinks alcohol. She reports that she does not use illicit drugs.  Allergies  Allergen Reactions  . Sulfa Antibiotics Hives and Rash    Family History: HTN in parents  Prior to Admission medications   Medication Sig Start Date End Date Taking? Authorizing Provider  ALPRAZolam Duanne Moron) 0.5 MG tablet Take 1 tablet (0.5 mg total) by mouth at bedtime as needed for sleep. 07/23/11  Yes [name], MD  aMILoride (MIDAMOR) 5 MG tablet Take 5 mg by mouth 2 (two) times daily.   Yes Historical Provider, MD  aspirin 325 MG tablet Take 325 mg by mouth daily.   Yes Historical Provider, MD  buPROPion (WELLBUTRIN SR) 150 MG 12 hr tablet Take 1 tablet (150 mg total) by mouth 2 (two) times daily. 07/23/11 07/22/12 Yes [name], MD  cholecalciferol (VITAMIN D) 1000 UNITS tablet Take 1,000 Units by mouth daily.   Yes Historical Provider, MD  hydrALAZINE (APRESOLINE) 25 MG tablet Take 2 tablets (50 mg total) by mouth 3 (three) times daily. 04/10/11  Yes Molli Hazard, MD  lisinopril (PRINIVIL,ZESTRIL) 40 MG  tablet Take 40 mg by mouth daily.    Yes Historical Provider, MD  Multiple Vitamins-Minerals (MULTIVITAMIN WITH MINERALS) tablet Take 1 tablet by mouth daily.   Yes Historical Provider, MD  vitamin B-12 (CYANOCOBALAMIN) 500 MCG tablet Take 500 mcg by mouth daily.   Yes Historical Provider, MD   Physical Exam: Filed Vitals:   08/22/11 1758 08/22/11 1814 08/22/11 1924 08/22/11 1954  BP: 197/143 222/128 207/108 165/111  Pulse: 82 84  78  Temp: 99.3 F (37.4 C)   98.5 F (36.9 C)  TempSrc: Tympanic   Oral  Resp: 20 20  20   Height:  5\' 10"  (1.778 m)    Weight:  117.935 kg (260 lb)    SpO2: 97% 97%  97%    Physical Exam  Constitutional: Appears well-developed and well-nourished. No distress.  HENT: Normocephalic. External right and left ear normal. Oropharynx is clear and moist.  Eyes: Conjunctivae and EOM are normal. PERRLA, no scleral icterus.  Neck: Normal ROM. Neck supple. No JVD. No tracheal deviation. No thyromegaly.  CVS: RRR, S1/S2 +, no murmurs, no gallops, no carotid bruit.  Pulmonary: Effort and breath sounds normal, no stridor, rhonchi, wheezes, rales.  Abdominal: Soft. BS +,  no distension, tenderness, rebound or guarding.  Musculoskeletal: Normal  range of motion. No edema and no tenderness.  Lymphadenopathy: No lymphadenopathy noted, cervical, inguinal. Neuro: Alert. Normal reflexes, muscle tone coordination. No cranial nerve deficit. Skin: Skin is warm and dry. No rash noted. Not diaphoretic. No erythema. No pallor.  Psychiatric: Normal mood and affect. Behavior, judgment, thought content normal.   Labs on Admission:  Basic Metabolic Panel:  Lab [id number]  NA 140  K 2.6*  CL 99  CO2 --  GLUCOSE 105*  BUN 20  CREATININE 1.70*  CALCIUM --  MG --  PHOS --   CBC:  Lab 08/22/11 1856  WBC --  NEUTROABS --  HGB 11.9*  HCT 35.0*  MCV --  PLT --    Radiological Exams on Admission: No results found.  EKG: normal sinus rhythm, LVH

## 2011-08-23 DIAGNOSIS — I10 Essential (primary) hypertension: Secondary | ICD-10-CM

## 2011-08-23 LAB — BASIC METABOLIC PANEL
BUN: 17 mg/dL (ref 6–23)
CO2: 28 mEq/L (ref 19–32)
Calcium: 9 mg/dL (ref 8.4–10.5)
Calcium: 8.9 mg/dL (ref 8.4–10.5)
Creatinine, Ser: 1.27 mg/dL — ABNORMAL HIGH (ref 0.50–1.10)
Creatinine, Ser: 1.15 mg/dL — ABNORMAL HIGH (ref 0.50–1.10)
GFR calc Af Amer: 68 mL/min — ABNORMAL LOW (ref >90)
GFR calc non Af Amer: 52 mL/min — ABNORMAL LOW (ref >90)
GFR calc non Af Amer: 59 mL/min — ABNORMAL LOW (ref >90)
Glucose, Bld: 115 mg/dL — ABNORMAL HIGH (ref 70–99)
Glucose, Bld: 114 mg/dL — ABNORMAL HIGH (ref 70–99)
Sodium: 137 mEq/L (ref 135–145)

## 2011-08-23 LAB — COMPREHENSIVE METABOLIC PANEL
BUN: 19 mg/dL (ref 6–23)
CO2: 30 mEq/L (ref 19–32)
Calcium: 8.7 mg/dL (ref 8.4–10.5)
Creatinine, Ser: 1.3 mg/dL — ABNORMAL HIGH (ref 0.50–1.10)
GFR calc Af Amer: 59 mL/min — ABNORMAL LOW (ref >90)
GFR calc non Af Amer: 51 mL/min — ABNORMAL LOW (ref >90)
Glucose, Bld: 142 mg/dL — ABNORMAL HIGH (ref 70–99)
Total Protein: 6.5 g/dL (ref 6.0–8.3)

## 2011-08-23 MED ORDER — POTASSIUM CHLORIDE CRYS ER 20 MEQ PO TBCR: 40.0000 meq | EXTENDED_RELEASE_TABLET | Freq: Once | ORAL | Status: DC

## 2011-08-23 MED ORDER — POTASSIUM CHLORIDE CRYS ER 20 MEQ PO TBCR
40.0000 meq | EXTENDED_RELEASE_TABLET | ORAL | Status: AC
  Administered 2011-08-23 (×2): 40 meq via ORAL
  Filled 2011-08-23 (×2): qty 2

## 2011-08-23 MED ORDER — HYDRALAZINE HCL 50 MG PO TABS
100.0000 mg | ORAL_TABLET | Freq: Three times a day (TID) | ORAL | Status: DC
  Administered 2011-08-23 – 2011-08-24 (×3): 100 mg via ORAL
  Filled 2011-08-23 (×5): qty 2

## 2011-08-23 MED ORDER — POTASSIUM CHLORIDE CRYS ER 20 MEQ PO TBCR
40.0000 meq | EXTENDED_RELEASE_TABLET | ORAL | Status: AC
  Administered 2011-08-23: 40 meq via ORAL
  Filled 2011-08-23: qty 2

## 2011-08-23 NOTE — Progress Notes (Signed)
CRITICAL VALUE ALERT  Critical value received:  K+ 2.5   Date of notification:  08/23/11  Time of notification:  0004  Critical value read back:yes  Nurse who received alert:  Cindi Carbon  MD notified (1st page): [place]  Time of first page: 0007  MD notified (2nd page):  Time of second page:  Responding MD:  Roger Shelter  Time MD responded: [id number]

## 2011-08-23 NOTE — Progress Notes (Signed)
[facility] PROGRESS NOTE  Robyn MCGLOCKLIN [id number] DOB: Feb 01, 1970 DOA: 08/22/2011 PCP: [name], MD  Assessment/Plan: Principal Problem:  *Accelerated hypertension Active Problems:  Anxiety disorder  Leukocytosis  Anemia of chronic disease  Hypokalemia  CKD (chronic kidney disease), stage III  1. Accelerated hypertension continue with clonidine, increase hydralazine 200 mg by mouth 3 times a day, holding ACE inhibitor because of renal insufficiency 2. Anemia of chronic disease hemoglobin at baseline 3. Acute renal insufficiency improved with IV hydration we'll reinitiate lisinopril in the morning 4. Shortness of breath 2-D echo pending 5. Hypokalemia replete  Code Status: full Family Communication: family updated about patient's clinical progress Disposition Plan:  We'll discharge tomorrow  Brief narrative: 41 year old female with history of malignant hypertension, anxiety, CKD who was sent to ED from urologist office for elevated blood pressure 186/135        HPI/Subjective: Complaining of a headache  Objective: Filed Vitals:   08/22/11 2030 08/22/11 2100 08/22/11 2200 08/23/11 0630  BP: 181/120 169/108 160/101 152/95  Pulse: 78 75 74 65  Temp:   98.8 F (37.1 C) 97.6 F (36.4 C)  TempSrc:   Oral Oral  Resp: 17 22 18 18   Height:      Weight:   126.372 kg (278 lb 9.6 oz)   SpO2: 97% 98% 93% 98%    Intake/Output Summary (Last 24 hours) at 08/23/11 1056 Last data filed at 08/23/11 0000  Gross per 24 hour  Intake    580 ml  Output      0 ml  Net    580 ml    Exam:  HENT:  Head: Atraumatic.  Nose: Nose normal.  Mouth/Throat: Oropharynx is clear and moist.  Eyes: Conjunctivae are normal. Pupils are equal, round, and reactive to light. No scleral icterus.  Neck: Neck supple. No tracheal deviation present.  Cardiovascular: Normal rate, regular rhythm, normal heart sounds and intact distal pulses.  Pulmonary/Chest: Effort normal and  breath sounds normal. No respiratory distress.  Abdominal: Soft. Normal appearance and bowel sounds are normal. She exhibits no distension. There is no tenderness.  Musculoskeletal: She exhibits no edema and no tenderness.  Neurological: She is alert. No cranial nerve deficit.    Data Reviewed: Basic Metabolic Panel:  Lab [id number] 0830 08/22/11 2254 08/22/11 2057 08/22/11 1856  NA 137 136 -- 140  K 2.9* 2.5* -- 2.6*  CL 96 94* -- 99  CO2 28 30 -- --  GLUCOSE 115* 142* -- 105*  BUN 19 19 -- 20  CREATININE 1.27* 1.30* -- 1.70*  CALCIUM 9.0 8.7 -- --  MG -- 1.6 1.7 --  PHOS -- 3.3 -- --    Liver Function Tests:  Lab 08/22/11 2254  AST 20  ALT 22  ALKPHOS 74  BILITOT 0.5  PROT 6.5  ALBUMIN 3.2*   No results found for this basename: LIPASE:5,AMYLASE:5 in the last 168 hours No results found for this basename: AMMONIA:5 in the last 168 hours  CBC:  Lab 08/22/11 2254 08/22/11 1856  WBC 8.1 --  NEUTROABS 4.9 --  HGB 11.5* 11.9*  HCT 32.6* 35.0*  MCV 92.6 --  PLT 221 --    Cardiac Enzymes: No results found for this basename: CKTOTAL:5,CKMB:5,CKMBINDEX:5,TROPONINI:5 in the last 168 hours BNP (last 3 results) No results found for this basename: PROBNP:3 in the last 8760 hours   CBG: No results found for this basename: GLUCAP:5 in the last 168 hours  No results found for this or any  previous visit (from the past 240 hour(s)).   Studies: No results found.  Scheduled Meds:   . aMILoride  5 mg Oral BID  . aspirin  325 mg Oral Daily  . buPROPion  150 mg Oral BID  . cholecalciferol  1,000 Units Oral Daily  . cloNIDine  0.1 mg Oral Once  . cloNIDine  0.2 mg Oral Once  . cloNIDine  0.2 mg Oral TID  . cyanocobalamin  500 mcg Oral Daily  . hydrALAZINE  100 mg Oral TID  . multivitamin with minerals  1 tablet Oral Daily  . potassium chloride  10 mEq Intravenous Once  . potassium chloride  20 mEq Oral Once  . potassium chloride  40 mEq Oral NOW  . potassium chloride   40 mEq Oral Q4H  . sodium chloride  3 mL Intravenous Q12H  . DISCONTD: hydrALAZINE  50 mg Oral TID  . DISCONTD: potassium chloride  40 mEq Oral Once   Continuous Infusions:   Principal Problem:  *Accelerated hypertension Active Problems:  Anxiety disorder  Leukocytosis  Anemia of chronic disease  Hypokalemia  CKD (chronic kidney disease), stage III    Time spent: 40 minutes   [facility] Pager [phone]. If 8PM-8AM, please contact night-coverage at [email or web address], password [email or web address] 08/23/2011, 10:56 AM  LOS: 1 day

## 2011-08-23 NOTE — Progress Notes (Signed)
*  PRELIMINARY RESULTS* Echocardiogram 2D Echocardiogram has been performed.  [place], [place] 08/23/2011, 11:24 AM

## 2011-08-24 MED ORDER — POTASSIUM CHLORIDE CRYS ER 20 MEQ PO TBCR
40.0000 meq | EXTENDED_RELEASE_TABLET | ORAL | Status: AC
  Administered 2011-08-24 (×2): 40 meq via ORAL
  Filled 2011-08-24 (×2): qty 2

## 2011-08-24 MED ORDER — POTASSIUM CHLORIDE CRYS ER 20 MEQ PO TBCR
80.0000 meq | EXTENDED_RELEASE_TABLET | Freq: Once | ORAL | Status: DC
  Filled 2011-08-24: qty 4

## 2011-08-24 MED ORDER — CLONIDINE HCL 0.1 MG PO TABS
0.1000 mg | ORAL_TABLET | ORAL | Status: AC
  Administered 2011-08-24: 0.1 mg via ORAL
  Filled 2011-08-24: qty 1

## 2011-08-24 MED ORDER — HYDRALAZINE HCL 100 MG PO TABS: 100.0000 mg | ORAL_TABLET | Freq: Three times a day (TID) | ORAL | Status: DC

## 2011-08-24 MED ORDER — POTASSIUM CHLORIDE CRYS ER 20 MEQ PO TBCR: 20.0000 meq | EXTENDED_RELEASE_TABLET | Freq: Three times a day (TID) | ORAL | Status: DC

## 2011-08-24 MED ORDER — CLONIDINE HCL 0.2 MG PO TABS: 0.2000 mg | ORAL_TABLET | Freq: Three times a day (TID) | ORAL | Status: DC

## 2011-08-24 MED ORDER — LISINOPRIL 20 MG PO TABS: 20.0000 mg | ORAL_TABLET | Freq: Every day | ORAL | Status: DC

## 2011-08-24 NOTE — Discharge Summary (Signed)
Physician Discharge Summary  Robyn [name] MRN: [id number] DOB/AGE: 40-Mar-1972 40 y.o..o.  PCP: [name], MD   Admit date: 08/22/2011 Discharge date: 08/24/2011  Discharge Diagnoses:     *Accelerated hypertension Severe hypokalemia  Anxiety disorder  Leukocytosis  Anemia of chronic disease Acute on chronic kidney disease  CKD (chronic kidney disease), stage III   Medication List  As of 08/24/2011  9:39 AM   TAKE these medications         ALPRAZolam 0.5 MG tablet   Commonly known as: XANAX   Take 1 tablet (0.5 mg total) by mouth at bedtime as needed for sleep.      aMILoride 5 MG tablet   Commonly known as: MIDAMOR   Take 5 mg by mouth 2 (two) times daily.      aspirin 325 MG tablet   Take 325 mg by mouth daily.      buPROPion 150 MG 12 hr tablet   Commonly known as: WELLBUTRIN SR   Take 1 tablet (150 mg total) by mouth 2 (two) times daily.      cholecalciferol 1000 UNITS tablet   Commonly known as: VITAMIN D   Take 1,000 Units by mouth daily.      cloNIDine 0.2 MG tablet   Commonly known as: CATAPRES   Take 1 tablet (0.2 mg total) by mouth 3 (three) times daily.      hydrALAZINE 100 MG tablet   Commonly known as: APRESOLINE   Take 1 tablet (100 mg total) by mouth 3 (three) times daily.      lisinopril 20 MG tablet   Commonly known as: PRINIVIL,ZESTRIL   Take 1 tablet (20 mg total) by mouth daily.      multivitamin with minerals tablet   Take 1 tablet by mouth daily.      potassium chloride SA 20 MEQ tablet   Commonly known as: K-DUR,KLOR-CON   Take 1 tablet (20 mEq total) by mouth 3 (three) times daily.      vitamin B-12 500 MCG tablet   Commonly known as: CYANOCOBALAMIN   Take 500 mcg by mouth daily.            Discharge Condition: Stable  Disposition: 01-Home or Self Care   Consults: *None  Significant Diagnostic Studies:   2-D echo  LV EF: 60% -  65%  ------------------------------------------------------------ Indications: Hypertension - malignant 401.0.  ------------------------------------------------------------ History: Risk factors: Chronic kidney disease. Current tobacco use. Hypertension.  ------------------------------------------------------------ Study Conclusions  - Left ventricle: The cavity size was normal. Wall thickness was increased in a pattern of severe LVH. There was severe concentric hypertrophy. Systolic function was normal. The estimated ejection fraction was in the range of 60% to 65%. Wall motion was normal; there were no regional wall motion abnormalities. Features are consistent with a pseudonormal left ventricular filling pattern, with concomitant abnormal relaxation and increased filling pressure (grade 2 diastolic dysfunction). Doppler parameters are consistent with high ventricular filling pressure. - Aortic valve: Sclerosis without stenosis. - Left atrium: The atrium was mildly dilated.   Microbiology: No results found for this or any previous visit (from the past 240 hour(s)).   Labs: Results for orders placed during the hospital encounter of 08/22/11 (from the past 48 hour(s))  POCT I-STAT, CHEM 8     Status: Abnormal   Collection Time   08/22/11  6:56 PM      Component Value Range Comment   Sodium 140  135 - 145 mEq/L  Potassium 2.6 (*) 3.5 - 5.1 mEq/L    Chloride 99  96 - 112 mEq/L    BUN 20  6 - 23 mg/dL    Creatinine, Ser 1.70 (*) 0.50 - 1.10 mg/dL    Glucose, Bld 105 (*) 70 - 99 mg/dL    Calcium, Ion 1.11 (*) 1.12 - 1.23 mmol/L    TCO2 29  0 - 100 mmol/L    Hemoglobin 11.9 (*) 12.0 - 15.0 g/dL    HCT 35.0 (*) 36.0 - 46.0 %    Comment NOTIFIED PHYSICIAN     POCT I-STAT TROPONIN I     Status: Normal   Collection Time   08/22/11  7:28 PM      Component Value Range Comment   Troponin i, poc 0.01  0.00 - 0.08 ng/mL    Comment 3            MAGNESIUM     Status: Normal    Collection Time   08/22/11  8:57 PM      Component Value Range Comment   Magnesium 1.7  1.5 - 2.5 mg/dL   COMPREHENSIVE METABOLIC PANEL     Status: Abnormal   Collection Time   08/22/11 10:54 PM      Component Value Range Comment   Sodium 136  135 - 145 mEq/L    Potassium 2.5 (*) 3.5 - 5.1 mEq/L    Chloride 94 (*) 96 - 112 mEq/L    CO2 30  19 - 32 mEq/L    Glucose, Bld 142 (*) 70 - 99 mg/dL    BUN 19  6 - 23 mg/dL    Creatinine, Ser 1.30 (*) 0.50 - 1.10 mg/dL    Calcium 8.7  8.4 - 10.5 mg/dL    Total Protein 6.5  6.0 - 8.3 g/dL    Albumin 3.2 (*) 3.5 - 5.2 g/dL    AST 20  0 - 37 U/L    ALT 22  0 - 35 U/L    Alkaline Phosphatase 74  39 - 117 U/L    Total Bilirubin 0.5  0.3 - 1.2 mg/dL    GFR calc non Af Amer 51 (*) >90 mL/min    GFR calc Af Amer 59 (*) >90 mL/min   MAGNESIUM     Status: Normal   Collection Time   08/22/11 10:54 PM      Component Value Range Comment   Magnesium 1.6  1.5 - 2.5 mg/dL   PHOSPHORUS     Status: Normal   Collection Time   08/22/11 10:54 PM      Component Value Range Comment   Phosphorus 3.3  2.3 - 4.6 mg/dL   CBC     Status: Abnormal   Collection Time   08/22/11 10:54 PM      Component Value Range Comment   WBC 8.1  4.0 - 10.5 K/uL    RBC 3.52 (*) 3.87 - 5.11 MIL/uL    Hemoglobin 11.5 (*) 12.0 - 15.0 g/dL    HCT 32.6 (*) 36.0 - 46.0 %    MCV 92.6  78.0 - 100.0 fL    MCH 32.7  26.0 - 34.0 pg    MCHC 35.3  30.0 - 36.0 g/dL    RDW 12.7  11.5 - 15.5 %    Platelets 221  150 - 400 K/uL   DIFFERENTIAL     Status: Normal   Collection Time   08/22/11 10:54 PM      Component  Value Range Comment   Neutrophils Relative 60  43 - 77 %    Neutro Abs 4.9  1.7 - 7.7 K/uL    Lymphocytes Relative 29  12 - 46 %    Lymphs Abs 2.3  0.7 - 4.0 K/uL    Monocytes Relative 6  3 - 12 %    Monocytes Absolute 0.5  0.1 - 1.0 K/uL    Eosinophils Relative 5  0 - 5 %    Eosinophils Absolute 0.4  0.0 - 0.7 K/uL    Basophils Relative 0  0 - 1 %    Basophils Absolute 0.0   0.0 - 0.1 K/uL   APTT     Status: Normal   Collection Time   08/22/11 10:54 PM      Component Value Range Comment   aPTT 35  24 - 37 seconds   PROTIME-INR     Status: Normal   Collection Time   08/22/11 10:54 PM      Component Value Range Comment   Prothrombin Time 12.7  11.6 - 15.2 seconds    INR 0.93  0.00 - 1.49   TSH     Status: Normal   Collection Time   08/22/11 10:54 PM      Component Value Range Comment   TSH 3.594  0.350 - 4.500 uIU/mL   BASIC METABOLIC PANEL     Status: Abnormal   Collection Time   08/23/11  8:30 AM      Component Value Range Comment   Sodium 137  135 - 145 mEq/L    Potassium 2.9 (*) 3.5 - 5.1 mEq/L    Chloride 96  96 - 112 mEq/L    CO2 28  19 - 32 mEq/L    Glucose, Bld 115 (*) 70 - 99 mg/dL    BUN 19  6 - 23 mg/dL    Creatinine, Ser 1.27 (*) 0.50 - 1.10 mg/dL    Calcium 9.0  8.4 - 10.5 mg/dL    GFR calc non Af Amer 52 (*) >90 mL/min    GFR calc Af Amer 60 (*) >90 mL/min   BASIC METABOLIC PANEL     Status: Abnormal   Collection Time   08/23/11 12:26 PM      Component Value Range Comment   Sodium 136  135 - 145 mEq/L    Potassium 3.1 (*) 3.5 - 5.1 mEq/L    Chloride 97  96 - 112 mEq/L    CO2 33 (*) 19 - 32 mEq/L    Glucose, Bld 114 (*) 70 - 99 mg/dL    BUN 17  6 - 23 mg/dL    Creatinine, Ser 1.15 (*) 0.50 - 1.10 mg/dL    Calcium 8.9  8.4 - 10.5 mg/dL    GFR calc non Af Amer 59 (*) >90 mL/min    GFR calc Af Amer 68 (*) >90 mL/min      HPI :41 year old female with history of malignant hypertension, anxiety, CKD who was sent to ED from urologist office for elevated blood pressure 186/135. Patient reported having tingling sensation in her arm and associated headaches, constant, throbbing as well as blurry vision but no complaints of chest pain, no shortness of breath, no fever. No complaints of lightheadedness or dizziness or loss of consciousness. NO complaints of abdominal pain, nausea or vomiting   HOSPITAL COURSE:  #1 accelerated hypertension.  The patient was continued on her outpatient medications, hydralazine was increased to 100 mg by  mouth 3 times a day, she was also initiated on clonidine. She will resume lisinopril as her creatinine has improved. Request nephrology to workup for secondary hypertension due to fibromuscular displasia, versus renal artery stenosis  #2 anemia of chronic disease secondary to CK D. hemoglobin stable  #3 hypokalemia history of hyperkalemia in the past. She will need regular potassium supplementation as well as closer bmet monitoring by her primary care provider and her nephrologist on a weekly basis   Discharge Exam:  Blood pressure 154/104, pulse 70, temperature 98.4 F (36.9 C), temperature source Oral, resp. rate 18, height 5\' 10"  (1.778 m), weight 126.372 kg (278 lb 9.6 oz), SpO2 97.00%.     Discharge Orders    Future Appointments: Provider: Department: Dept Phone: Center:   08/27/2011 7:45 AM [name], MD [place] [phone] [facility]   09/23/2011 9:45 AM [name], MD [facility] [phone] None        Signed: Reyne Dumas 08/24/2011, 9:40 AM

## 2011-08-27 ENCOUNTER — Ambulatory Visit: Payer: 59 | Admitting: Family Medicine

## 2011-08-28 ENCOUNTER — Other Ambulatory Visit: Payer: Self-pay | Admitting: Urology

## 2011-08-29 ENCOUNTER — Other Ambulatory Visit: Payer: Self-pay | Admitting: Urology

## 2011-08-29 DIAGNOSIS — N200 Calculus of kidney: Secondary | ICD-10-CM

## 2011-09-23 ENCOUNTER — Ambulatory Visit: Payer: Self-pay | Admitting: Obstetrics and Gynecology

## 2011-10-07 ENCOUNTER — Telehealth: Payer: Self-pay

## 2011-10-07 ENCOUNTER — Encounter (HOSPITAL_COMMUNITY): Payer: Self-pay | Admitting: Pharmacy Technician

## 2011-10-07 NOTE — Telephone Encounter (Signed)
She needs a prescription for a C-Pap because hers is not working. She also wants to know if she can switch the Wellbutrin for Chantix. Please call her at [phone] or at work at [phone]

## 2011-10-08 ENCOUNTER — Other Ambulatory Visit: Payer: Self-pay | Admitting: Radiology

## 2011-10-08 NOTE — Telephone Encounter (Signed)
Pt Robyn [name] and reports that the Wellbutrin is really not helping her at all for smoking cessation, and she knows that when she had taken Chantix in the past it did seem to help. Explained to pt that we would at least need a copy of her sleep study in order to see if Dr Leward Quan could write an order for a new CPAP. Pt will try to get [place] a copy and understands that she may need to RTC and start process over if necessary. Dr Leward Quan, do you want to Rx Chantix for pt? Does she need to taper off the Wellbutrin?

## 2011-10-08 NOTE — Telephone Encounter (Signed)
LMOM for pt to CB and explain why she wants to change her medication.

## 2011-10-09 ENCOUNTER — Encounter (HOSPITAL_COMMUNITY): Payer: Self-pay

## 2011-10-09 ENCOUNTER — Encounter (HOSPITAL_COMMUNITY)
Admission: RE | Admit: 2011-10-09 | Discharge: 2011-10-09 | Disposition: A | Discharge status: Home / Self Care | Payer: 59 | Source: Ambulatory Visit | Attending: Urology | Admitting: Urology

## 2011-10-09 ENCOUNTER — Ambulatory Visit (HOSPITAL_COMMUNITY)
Admission: RE | Admit: 2011-10-09 | Discharge: 2011-10-09 | Disposition: A | Discharge status: Home / Self Care | Payer: 59 | Source: Ambulatory Visit | Attending: Urology | Admitting: Urology

## 2011-10-09 DIAGNOSIS — K219 Gastro-esophageal reflux disease without esophagitis: Secondary | ICD-10-CM | POA: Insufficient documentation

## 2011-10-09 DIAGNOSIS — Z01812 Encounter for preprocedural laboratory examination: Secondary | ICD-10-CM | POA: Insufficient documentation

## 2011-10-09 DIAGNOSIS — I10 Essential (primary) hypertension: Secondary | ICD-10-CM | POA: Insufficient documentation

## 2011-10-09 DIAGNOSIS — N200 Calculus of kidney: Secondary | ICD-10-CM | POA: Insufficient documentation

## 2011-10-09 DIAGNOSIS — G4730 Sleep apnea, unspecified: Secondary | ICD-10-CM | POA: Insufficient documentation

## 2011-10-09 HISTORY — DX: Cough: R05

## 2011-10-09 HISTORY — DX: Cough, unspecified: R05.9

## 2011-10-09 LAB — CBC
HCT: 33.9 % — ABNORMAL LOW (ref 36.0–46.0)
MCV: 92.6 fL (ref 78.0–100.0)
Platelets: 269 10*3/uL (ref 150–400)
RBC: 3.66 MIL/uL — ABNORMAL LOW (ref 3.87–5.11)
RDW: 12.9 % (ref 11.5–15.5)
WBC: 7.9 10*3/uL (ref 4.0–10.5)

## 2011-10-09 LAB — BASIC METABOLIC PANEL
CO2: 24 mEq/L (ref 19–32)
Chloride: 100 mEq/L (ref 96–112)
GFR calc Af Amer: 52 mL/min — ABNORMAL LOW (ref >90)
Potassium: 3.2 mEq/L — ABNORMAL LOW (ref 3.5–5.1)
Sodium: 137 mEq/L (ref 135–145)

## 2011-10-09 LAB — PROTIME-INR: INR: 0.98 (ref 0.00–1.49)

## 2011-10-09 LAB — APTT: aPTT: 36 seconds (ref 24–37)

## 2011-10-09 LAB — HCG, SERUM, QUALITATIVE: Preg, Serum: NEGATIVE

## 2011-10-09 NOTE — Telephone Encounter (Signed)
Pt last seen by me in July; she will need to schedule appt. If she is taking Wellbutrin twice a day, she should reduce dose to once a day until follow-up with me.

## 2011-10-09 NOTE — Progress Notes (Signed)
ekg 08-22-2011 epic

## 2011-10-09 NOTE — Patient Instructions (Addendum)
[place]  10/09/2011   Your procedure is scheduled on:  10-15-2011  Report to [facility] at  0730 AM.  Call this number if you have problems the morning of surgery: [phone]   Remember:   Do not eat food or drink liquids:After Midnight.  .  Take these medicines the morning of surgery with A SIP OF WATER:clonidine, wellbutrin, cardvedilol.    Do not wear jewelry or make up.  Do not wear lotions, powders, or perfumes. You may wear deodorant.    Do not bring valuables to the hospital.  Contacts, dentures or bridgework may not be worn into surgery.  Leave suitcase in the car. After surgery it may be brought to your room.  For patients admitted to the hospital, checkout time is 11:00 AM the day of discharge                             Patients discharged the day of surgery will not be allowed to drive home. If going home same day of surgery, you must have someone stay with you the first 24 hours at home and arrange for some one to drive you home from hospital.    Special Instructions: See [facility] Preparing for Surgery instruction sheet. Women do not shave legs or underarms for 12 hours before showers. Men may shave face morning of surgery.    Please read over the following fact sheets that you were given: MRSA [place] [facility] pre op nurse phone number [phone], call if needed

## 2011-10-09 NOTE — Telephone Encounter (Signed)
Patient transferred to appt scheduling.

## 2011-10-14 ENCOUNTER — Other Ambulatory Visit: Payer: Self-pay | Admitting: Radiology

## 2011-10-15 ENCOUNTER — Observation Stay (HOSPITAL_COMMUNITY)
Admission: RE | Admit: 2011-10-15 | Discharge: 2011-10-16 | Disposition: A | Discharge status: Home / Self Care | Payer: 59 | Source: Ambulatory Visit | Attending: Urology | Admitting: Urology

## 2011-10-15 ENCOUNTER — Encounter (HOSPITAL_COMMUNITY)
Admission: RE | Disposition: A | Discharge status: Home / Self Care | Payer: Self-pay | Source: Ambulatory Visit | Attending: Urology

## 2011-10-15 ENCOUNTER — Encounter (HOSPITAL_COMMUNITY): Payer: Self-pay | Admitting: *Deleted

## 2011-10-15 ENCOUNTER — Ambulatory Visit (HOSPITAL_COMMUNITY): Payer: 59

## 2011-10-15 ENCOUNTER — Ambulatory Visit (HOSPITAL_COMMUNITY)
Admission: RE | Admit: 2011-10-15 | Discharge: 2011-10-15 | Disposition: A | Discharge status: Home / Self Care | Payer: 59 | Source: Ambulatory Visit | Attending: Urology | Admitting: Urology

## 2011-10-15 ENCOUNTER — Ambulatory Visit (HOSPITAL_COMMUNITY): Payer: 59 | Admitting: *Deleted

## 2011-10-15 VITALS — BP 156/98 | HR 64 | Temp 97.9°F | Resp 11 | Ht 66.0 in | Wt 276.0 lb

## 2011-10-15 DIAGNOSIS — K219 Gastro-esophageal reflux disease without esophagitis: Secondary | ICD-10-CM | POA: Insufficient documentation

## 2011-10-15 DIAGNOSIS — G4730 Sleep apnea, unspecified: Secondary | ICD-10-CM | POA: Insufficient documentation

## 2011-10-15 DIAGNOSIS — N200 Calculus of kidney: Secondary | ICD-10-CM

## 2011-10-15 DIAGNOSIS — I10 Essential (primary) hypertension: Secondary | ICD-10-CM | POA: Insufficient documentation

## 2011-10-15 HISTORY — PX: REMOVAL OF STONES: SHX5545

## 2011-10-15 HISTORY — PX: NEPHROLITHOTOMY: SHX5134

## 2011-10-15 LAB — HEMOGLOBIN AND HEMATOCRIT, BLOOD
HCT: 34 % — ABNORMAL LOW (ref 36.0–46.0)
Hemoglobin: 11.9 g/dL — ABNORMAL LOW (ref 12.0–15.0)

## 2011-10-15 SURGERY — NEPHROLITHOTOMY PERCUTANEOUS
Anesthesia: General | Site: Ureter | Laterality: Left | Wound class: Clean Contaminated

## 2011-10-15 MED ORDER — LACTATED RINGERS IV SOLN
INTRAVENOUS | Status: DC
  Administered 2011-10-15: 1000 mL via INTRAVENOUS

## 2011-10-15 MED ORDER — IOHEXOL 300 MG/ML  SOLN
INTRAMUSCULAR | Status: DC | PRN
  Administered 2011-10-15: 50 mL

## 2011-10-15 MED ORDER — FENTANYL CITRATE 0.05 MG/ML IJ SOLN
INTRAMUSCULAR | Status: DC | PRN
  Administered 2011-10-15: 250 ug via INTRAVENOUS

## 2011-10-15 MED ORDER — FENTANYL CITRATE 0.05 MG/ML IJ SOLN
INTRAMUSCULAR | Status: AC | PRN
  Administered 2011-10-15 (×2): 100 ug via INTRAVENOUS

## 2011-10-15 MED ORDER — HYDRALAZINE HCL 20 MG/ML IJ SOLN
INTRAMUSCULAR | Status: AC
  Administered 2011-10-15: 5 mg via INTRAVENOUS
  Filled 2011-10-15: qty 1

## 2011-10-15 MED ORDER — ACETAMINOPHEN 10 MG/ML IV SOLN
1000.0000 mg | Freq: Four times a day (QID) | INTRAVENOUS | Status: AC
  Administered 2011-10-15 – 2011-10-16 (×4): 1000 mg via INTRAVENOUS
  Filled 2011-10-15 (×4): qty 100

## 2011-10-15 MED ORDER — ONDANSETRON HCL 4 MG/2ML IJ SOLN
INTRAMUSCULAR | Status: DC | PRN
  Administered 2011-10-15: 4 mg via INTRAVENOUS

## 2011-10-15 MED ORDER — MIDAZOLAM HCL 2 MG/2ML IJ SOLN
INTRAMUSCULAR | Status: AC
  Filled 2011-10-15: qty 6

## 2011-10-15 MED ORDER — INSULIN ASPART 100 UNIT/ML ~~LOC~~ SOLN
0.0000 [IU] | Freq: Three times a day (TID) | SUBCUTANEOUS | Status: DC
  Administered 2011-10-15 – 2011-10-16 (×2): 3 [IU] via SUBCUTANEOUS
  Administered 2011-10-16: 4 [IU] via SUBCUTANEOUS

## 2011-10-15 MED ORDER — CISATRACURIUM BESYLATE (PF) 10 MG/5ML IV SOLN
INTRAVENOUS | Status: DC | PRN
  Administered 2011-10-15: 6 mg via INTRAVENOUS

## 2011-10-15 MED ORDER — EPHEDRINE SULFATE 50 MG/ML IJ SOLN
INTRAMUSCULAR | Status: DC | PRN
  Administered 2011-10-15: 10 mg via INTRAVENOUS
  Administered 2011-10-15: 5 mg via INTRAVENOUS
  Administered 2011-10-15: 10 mg via INTRAVENOUS

## 2011-10-15 MED ORDER — LACTATED RINGERS IV SOLN: INTRAVENOUS | Status: DC

## 2011-10-15 MED ORDER — MIDAZOLAM HCL 5 MG/5ML IJ SOLN
INTRAMUSCULAR | Status: AC | PRN
  Administered 2011-10-15: 2 mg via INTRAVENOUS
  Administered 2011-10-15: 1 mg via INTRAVENOUS

## 2011-10-15 MED ORDER — SODIUM CHLORIDE 0.9 % IV SOLN: Freq: Once | INTRAVENOUS | Status: DC

## 2011-10-15 MED ORDER — CIPROFLOXACIN HCL 500 MG PO TABS
500.0000 mg | ORAL_TABLET | Freq: Two times a day (BID) | ORAL | Status: DC
  Filled 2011-10-15 (×2): qty 1

## 2011-10-15 MED ORDER — POTASSIUM CHLORIDE 20 MEQ PO PACK
20.0000 meq | PACK | Freq: Three times a day (TID) | ORAL | Status: DC
  Filled 2011-10-15 (×2): qty 1

## 2011-10-15 MED ORDER — SUCCINYLCHOLINE CHLORIDE 20 MG/ML IJ SOLN
INTRAMUSCULAR | Status: DC | PRN
  Administered 2011-10-15: 140 mg via INTRAVENOUS

## 2011-10-15 MED ORDER — PROMETHAZINE HCL 25 MG/ML IJ SOLN: 6.2500 mg | INTRAMUSCULAR | Status: DC | PRN

## 2011-10-15 MED ORDER — LISINOPRIL 20 MG PO TABS
40.0000 mg | ORAL_TABLET | Freq: Every morning | ORAL | Status: DC
Start: 2011-10-16 — End: 2011-10-16
  Administered 2011-10-16: 40 mg via ORAL
  Filled 2011-10-15: qty 2

## 2011-10-15 MED ORDER — ACETAMINOPHEN 10 MG/ML IV SOLN
INTRAVENOUS | Status: DC | PRN
  Administered 2011-10-15: 1000 mg via INTRAVENOUS

## 2011-10-15 MED ORDER — ONDANSETRON HCL 4 MG/2ML IJ SOLN: 4.0000 mg | INTRAMUSCULAR | Status: DC | PRN

## 2011-10-15 MED ORDER — HYOSCYAMINE SULFATE 0.125 MG SL SUBL
0.1250 mg | SUBLINGUAL_TABLET | SUBLINGUAL | Status: DC | PRN
  Administered 2011-10-16: 0.125 mg via ORAL
  Filled 2011-10-15 (×2): qty 1

## 2011-10-15 MED ORDER — LORATADINE 10 MG PO TABS
10.0000 mg | ORAL_TABLET | Freq: Every day | ORAL | Status: DC
  Administered 2011-10-16: 10 mg via ORAL
  Filled 2011-10-15: qty 1

## 2011-10-15 MED ORDER — PROPOFOL 10 MG/ML IV EMUL
INTRAVENOUS | Status: DC | PRN
  Administered 2011-10-15: 200 mg via INTRAVENOUS

## 2011-10-15 MED ORDER — SODIUM CHLORIDE 0.9 % IV SOLN
2.0000 g | INTRAVENOUS | Status: AC
  Administered 2011-10-15: 2 g via INTRAVENOUS
  Filled 2011-10-15: qty 2000

## 2011-10-15 MED ORDER — POTASSIUM CHLORIDE CRYS ER 20 MEQ PO TBCR
20.0000 meq | EXTENDED_RELEASE_TABLET | Freq: Three times a day (TID) | ORAL | Status: DC
  Administered 2011-10-15 – 2011-10-16 (×3): 20 meq via ORAL
  Filled 2011-10-15 (×5): qty 1

## 2011-10-15 MED ORDER — HYDROMORPHONE HCL PF 1 MG/ML IJ SOLN: 0.2500 mg | INTRAMUSCULAR | Status: DC | PRN

## 2011-10-15 MED ORDER — DEXAMETHASONE SODIUM PHOSPHATE 10 MG/ML IJ SOLN
INTRAMUSCULAR | Status: DC | PRN
  Administered 2011-10-15: 10 mg via INTRAVENOUS

## 2011-10-15 MED ORDER — IOHEXOL 300 MG/ML  SOLN: 15.0000 mL | Freq: Once | INTRAMUSCULAR | Status: DC | PRN

## 2011-10-15 MED ORDER — ALPRAZOLAM 0.5 MG PO TABS: 0.5000 mg | ORAL_TABLET | Freq: Every evening | ORAL | Status: DC | PRN

## 2011-10-15 MED ORDER — SODIUM CHLORIDE 0.9 % IR SOLN
Status: DC | PRN
  Administered 2011-10-15: 1000 mL

## 2011-10-15 MED ORDER — CEFAZOLIN SODIUM-DEXTROSE 2-3 GM-% IV SOLR
INTRAVENOUS | Status: AC
  Filled 2011-10-15: qty 50

## 2011-10-15 MED ORDER — SODIUM CHLORIDE 0.9 % IR SOLN
Status: DC | PRN
  Administered 2011-10-15: 6000 mL

## 2011-10-15 MED ORDER — IOHEXOL 300 MG/ML  SOLN
INTRAMUSCULAR | Status: AC
  Filled 2011-10-15: qty 2

## 2011-10-15 MED ORDER — CARVEDILOL 25 MG PO TABS
25.0000 mg | ORAL_TABLET | Freq: Two times a day (BID) | ORAL | Status: DC
  Administered 2011-10-15 – 2011-10-16 (×2): 25 mg via ORAL
  Filled 2011-10-15 (×4): qty 1

## 2011-10-15 MED ORDER — SODIUM CHLORIDE 0.9 % IV SOLN
INTRAVENOUS | Status: DC
  Administered 2011-10-15: 18:00:00 via INTRAVENOUS

## 2011-10-15 MED ORDER — SODIUM CHLORIDE 0.9 % IV SOLN
1.0000 g | Freq: Four times a day (QID) | INTRAVENOUS | Status: AC
  Administered 2011-10-15 – 2011-10-16 (×4): 1 g via INTRAVENOUS
  Filled 2011-10-15 (×4): qty 1000

## 2011-10-15 MED ORDER — SODIUM CHLORIDE 0.9 % IV SOLN
Freq: Once | INTRAVENOUS | Status: AC
  Administered 2011-10-15 (×2): via INTRAVENOUS

## 2011-10-15 MED ORDER — FENTANYL CITRATE 0.05 MG/ML IJ SOLN
INTRAMUSCULAR | Status: AC
  Filled 2011-10-15: qty 6

## 2011-10-15 MED ORDER — ACETAMINOPHEN 10 MG/ML IV SOLN
INTRAVENOUS | Status: AC
  Filled 2011-10-15: qty 100

## 2011-10-15 MED ORDER — HYDRALAZINE HCL 50 MG PO TABS
100.0000 mg | ORAL_TABLET | Freq: Three times a day (TID) | ORAL | Status: DC
Start: 2011-10-15 — End: 2011-10-16
  Administered 2011-10-15 – 2011-10-16 (×3): 100 mg via ORAL
  Filled 2011-10-15 (×5): qty 2

## 2011-10-15 MED ORDER — MORPHINE SULFATE 2 MG/ML IJ SOLN
INTRAMUSCULAR | Status: AC
  Filled 2011-10-15: qty 1

## 2011-10-15 MED ORDER — CEFAZOLIN SODIUM-DEXTROSE 2-3 GM-% IV SOLR: 2.0000 g | Freq: Once | INTRAVENOUS | Status: DC

## 2011-10-15 MED ORDER — LIDOCAINE HCL 4 % MT SOLN
OROMUCOSAL | Status: DC | PRN
  Administered 2011-10-15: 4 mL via TOPICAL

## 2011-10-15 MED ORDER — LIDOCAINE HCL 1 % IJ SOLN
INTRAMUSCULAR | Status: AC
  Filled 2011-10-15: qty 20

## 2011-10-15 MED ORDER — AMILORIDE HCL 5 MG PO TABS
10.0000 mg | ORAL_TABLET | Freq: Two times a day (BID) | ORAL | Status: DC
  Administered 2011-10-15 – 2011-10-16 (×2): 10 mg via ORAL
  Filled 2011-10-15 (×3): qty 2

## 2011-10-15 MED ORDER — SODIUM CHLORIDE 0.9 % IV SOLN: 250.0000 mL | INTRAVENOUS | Status: DC

## 2011-10-15 MED ORDER — LIDOCAINE HCL (CARDIAC) 20 MG/ML IV SOLN
INTRAVENOUS | Status: DC | PRN
  Administered 2011-10-15: 30 mg via INTRAVENOUS

## 2011-10-15 MED ORDER — SENNOSIDES-DOCUSATE SODIUM 8.6-50 MG PO TABS
1.0000 | ORAL_TABLET | Freq: Two times a day (BID) | ORAL | Status: DC
  Administered 2011-10-15 – 2011-10-16 (×2): 1 via ORAL
  Filled 2011-10-15 (×3): qty 1

## 2011-10-15 MED ORDER — ACETAMINOPHEN 325 MG PO TABS: 650.0000 mg | ORAL_TABLET | ORAL | Status: DC | PRN

## 2011-10-15 MED ORDER — BUPROPION HCL ER (SR) 150 MG PO TB12
150.0000 mg | ORAL_TABLET | Freq: Two times a day (BID) | ORAL | Status: DC
  Administered 2011-10-15 – 2011-10-16 (×2): 150 mg via ORAL
  Filled 2011-10-15 (×3): qty 1

## 2011-10-15 MED ORDER — BUPIVACAINE HCL (PF) 0.5 % IJ SOLN
INTRAMUSCULAR | Status: DC | PRN
  Administered 2011-10-15: 30 mL

## 2011-10-15 MED ORDER — SODIUM CHLORIDE 0.9 % IJ SOLN: 3.0000 mL | INTRAMUSCULAR | Status: DC | PRN

## 2011-10-15 MED ORDER — HYDRALAZINE HCL 20 MG/ML IJ SOLN
5.0000 mg | Freq: Once | INTRAMUSCULAR | Status: AC
  Administered 2011-10-15: 5 mg via INTRAVENOUS

## 2011-10-15 MED ORDER — BUPIVACAINE HCL (PF) 0.5 % IJ SOLN
INTRAMUSCULAR | Status: AC
  Filled 2011-10-15: qty 30

## 2011-10-15 MED ORDER — SODIUM CHLORIDE 0.9 % IJ SOLN
3.0000 mL | Freq: Two times a day (BID) | INTRAMUSCULAR | Status: DC
  Administered 2011-10-16: 3 mL via INTRAVENOUS

## 2011-10-15 MED ORDER — GENTAMICIN SULFATE 40 MG/ML IJ SOLN
430.0000 mg | INTRAVENOUS | Status: AC
  Administered 2011-10-15: 430 mg via INTRAVENOUS
  Filled 2011-10-15: qty 10.75

## 2011-10-15 MED ORDER — OXYCODONE HCL 5 MG PO TABS
5.0000 mg | ORAL_TABLET | ORAL | Status: DC | PRN
  Administered 2011-10-15 (×2): 10 mg via ORAL
  Administered 2011-10-16: 5 mg via ORAL
  Filled 2011-10-15 (×2): qty 2
  Filled 2011-10-15: qty 1

## 2011-10-15 MED ORDER — CIPROFLOXACIN IN D5W 400 MG/200ML IV SOLN
400.0000 mg | Freq: Once | INTRAVENOUS | Status: AC
  Administered 2011-10-15: 400 mg via INTRAVENOUS
  Filled 2011-10-15: qty 200

## 2011-10-15 MED ORDER — INSULIN ASPART 100 UNIT/ML ~~LOC~~ SOLN: 0.0000 [IU] | Freq: Every day | SUBCUTANEOUS | Status: DC

## 2011-10-15 MED ORDER — MEPERIDINE HCL 50 MG/ML IJ SOLN: 6.2500 mg | INTRAMUSCULAR | Status: DC | PRN

## 2011-10-15 MED ORDER — CLONIDINE HCL 0.2 MG PO TABS
0.2000 mg | ORAL_TABLET | Freq: Three times a day (TID) | ORAL | Status: DC
  Administered 2011-10-15 – 2011-10-16 (×3): 0.2 mg via ORAL
  Filled 2011-10-15 (×5): qty 1

## 2011-10-15 MED ORDER — MORPHINE SULFATE 2 MG/ML IJ SOLN
2.0000 mg | INTRAMUSCULAR | Status: DC | PRN
  Administered 2011-10-15: 2 mg via INTRAVENOUS

## 2011-10-15 SURGICAL SUPPLY — 53 items
APL SKNCLS STERI-STRIP NONHPOA (GAUZE/BANDAGES/DRESSINGS) ×1
BAG URINE DRAINAGE (UROLOGICAL SUPPLIES) ×2 IMPLANT
BANDAGE GAUZE ELAST BULKY 4 IN (GAUZE/BANDAGES/DRESSINGS) ×2 IMPLANT
BASKET STONE NITINOL 3FRX115MB (UROLOGICAL SUPPLIES) IMPLANT
BENZOIN TINCTURE PRP APPL 2/3 (GAUZE/BANDAGES/DRESSINGS) ×3 IMPLANT
CATCHER STONE W/TUBE ADAPTER (UROLOGICAL SUPPLIES) ×1 IMPLANT
CATH FOLEY 2W COUNCIL 20FR 5CC (CATHETERS) ×1 IMPLANT
CATH FOLEY 2WAY SLVR  5CC 18FR (CATHETERS) ×1
CATH FOLEY 2WAY SLVR 5CC 18FR (CATHETERS) ×1 IMPLANT
CATH ROBINSON RED A/P 20FR (CATHETERS) IMPLANT
CATH URET 5FR 28IN OPEN ENDED (CATHETERS) ×2 IMPLANT
CATH URET DUAL LUMEN 6-10FR 50 (CATHETERS) ×2 IMPLANT
CATH X-FORCE N30 NEPHROSTOMY (TUBING) ×2 IMPLANT
CHLORAPREP W/TINT 26ML (MISCELLANEOUS) ×2 IMPLANT
CLOTH BEACON ORANGE TIMEOUT ST (SAFETY) ×2 IMPLANT
COVER SURGICAL LIGHT HANDLE (MISCELLANEOUS) ×2 IMPLANT
DRAPE C-ARM 42X72 X-RAY (DRAPES) ×2 IMPLANT
DRAPE CAMERA CLOSED 9X96 (DRAPES) ×2 IMPLANT
DRAPE LINGEMAN PERC (DRAPES) ×2 IMPLANT
DRAPE SURG IRRIG POUCH 19X23 (DRAPES) ×2 IMPLANT
DRAPE UTILITY XL STRL (DRAPES) ×2 IMPLANT
DRSG PAD ABDOMINAL 8X10 ST (GAUZE/BANDAGES/DRESSINGS) ×1 IMPLANT
GLOVE BIOGEL M 7.0 STRL (GLOVE) ×1 IMPLANT
GLOVE BIOGEL PI IND STRL 7.5 (GLOVE) ×1 IMPLANT
GLOVE BIOGEL PI INDICATOR 7.5 (GLOVE) ×1
GLOVE ECLIPSE 7.0 STRL STRAW (GLOVE) ×2 IMPLANT
GOWN PREVENTION PLUS XLARGE (GOWN DISPOSABLE) ×2 IMPLANT
GUIDEWIRE STR DUAL SENSOR (WIRE) ×2 IMPLANT
GUIDEWIRE SUPER STIFF (WIRE) ×2 IMPLANT
KIT BASIN OR (CUSTOM PROCEDURE TRAY) ×2 IMPLANT
LASER FIBER DISP (UROLOGICAL SUPPLIES) IMPLANT
LASER FIBER DISP 1000U (UROLOGICAL SUPPLIES) IMPLANT
MANIFOLD NEPTUNE II (INSTRUMENTS) ×2 IMPLANT
NDL SPNL 22GX3.5 QUINCKE BK (NEEDLE) IMPLANT
NEEDLE SPNL 22GX3.5 QUINCKE BK (NEEDLE) ×2 IMPLANT
PACK BASIC VI WITH GOWN DISP (CUSTOM PROCEDURE TRAY) ×2 IMPLANT
PROBE LITHOCLAST ULTRA 3.8X403 (UROLOGICAL SUPPLIES) IMPLANT
PROBE PNEUMATIC 1.0MMX570MM (UROLOGICAL SUPPLIES) ×2 IMPLANT
SET IRRIG Y TYPE TUR BLADDER L (SET/KITS/TRAYS/PACK) ×2 IMPLANT
SET WARMING FLUID IRRIGATION (MISCELLANEOUS) ×2 IMPLANT
SHEATH X FORCE 10MMX22CM (SHEATH) ×1 IMPLANT
SPONGE LAP 4X18 X RAY DECT (DISPOSABLE) ×2 IMPLANT
STENT ENDOURETEROTOMY 7-14 26C (STENTS) IMPLANT
STONE CATCHER W/TUBE ADAPTER (UROLOGICAL SUPPLIES) ×2 IMPLANT
SUT SILK 2 0 30  PSL (SUTURE) ×1
SUT SILK 2 0 30 PSL (SUTURE) ×1 IMPLANT
SYR 20CC LL (SYRINGE) ×4 IMPLANT
SYRINGE 10CC LL (SYRINGE) ×2 IMPLANT
SYRINGE IRR TOOMEY STRL 70CC (SYRINGE) ×2 IMPLANT
TAPE CLOTH SURG 4X10 WHT LF (GAUZE/BANDAGES/DRESSINGS) ×1 IMPLANT
TOWEL OR NON WOVEN STRL DISP B (DISPOSABLE) ×2 IMPLANT
TRAY FOLEY CATH 14FRSI W/METER (CATHETERS) ×2 IMPLANT
TUBING CONNECTING 10 (TUBING) ×6 IMPLANT

## 2011-10-15 NOTE — Procedures (Signed)
Successful LT NEPHROURETERAL CATHETER ACCESS NO COMP STABLE PLAN FOR OR PNL LATER TODAY

## 2011-10-15 NOTE — Brief Op Note (Signed)
10/15/2011  1:08 PM  PATIENT:  Robyn [name]  41 y.o. female  PRE-OPERATIVE DIAGNOSIS:  LEFT NEPHROLITHIASIS  POST-OPERATIVE DIAGNOSIS:  left nephrolithiasis  PROCEDURE:  Procedure(s) (LRB) with comments: NEPHROLITHOTOMY PERCUTANEOUS (Left) REMOVAL OF STONES (Left)  SURGEON:  Surgeon(s) and Role:    * Molli Hazard, MD - Primary  PHYSICIAN ASSISTANT:   ASSISTANTS: none   ANESTHESIA:   local and general  EBL: 50cc  Total I/O In: -  Out: 200 [Urine:150; Blood:50]  BLOOD ADMINISTERED:none  DRAINS: Urinary Catheter (Foley) and Right nephrostomy tube, right nephroureter catheter.   LOCAL MEDICATIONS USED:  MARCAINE    and Amount: 30 ml  SPECIMEN:  Source of Specimen:  Left renal stone  DISPOSITION OF SPECIMEN:  provided to patient.  COUNTS:  YES  TOURNIQUET:  * No tourniquets in log *  DICTATION: .Other Dictation: Dictation Number [id number]  PLAN OF CARE: Admit for overnight observation  PATIENT DISPOSITION:  PACU - hemodynamically stable.   Delay start of Pharmacological VTE agent (>24hrs) due to surgical blood loss or risk of bleeding: yes

## 2011-10-15 NOTE — Progress Notes (Signed)
ANTIBIOTIC CONSULT NOTE - INITIAL  Pharmacy Consult for Gentamycin for synergy Indication: Left nephrolithiasis. Left PCNL today  Allergies  Allergen Reactions  . Sulfa Antibiotics Hives and Rash    Patient Measurements: Height: 5\' 6"  (167.6 cm) Weight: 276 lb 0.3 oz (125.2 kg) IBW/kg (Calculated) : 59.3  Adjusted Body Weight: 85.36kg  Vital Signs: Temp: 97.6 F (36.4 C) (10/09 1531) Temp src: Oral (10/09 1531) BP: 159/109 mmHg (10/09 1531) Pulse Rate: 78  (10/09 1531) Intake/Output from previous day:   Intake/Output from this shift: Total I/O In: -  Out: 890 [Urine:790; Blood:100]  Labs:  Basename 10/15/11 1347  WBC --  HGB 11.9*  PLT --  LABCREA --  CREATININE --   Estimated Creatinine Clearance: 70.8 ml/min (by C-G formula based on Cr of 1.43). No results found for this basename: VANCOTROUGH:2,VANCOPEAK:2,VANCORANDOM:2,GENTTROUGH:2,GENTPEAK:2,GENTRANDOM:2,TOBRATROUGH:2,TOBRAPEAK:2,TOBRARND:2,AMIKACINPEAK:2,AMIKACINTROU:2,AMIKACIN:2, in the last 72 hours   Microbiology: Recent Results (from the past 720 hour(s))  SURGICAL PCR SCREEN     Status: Normal   Collection Time   10/09/11  1:41 PM      Component Value Range Status Comment   MRSA, PCR NEGATIVE  NEGATIVE Final    Staphylococcus aureus NEGATIVE  NEGATIVE Final     Medical History: Past Medical History  Diagnosis Date  . Hypertension   . Shortness of breath     ONLY WITH ANXIETY  . Anxiety     OCCAS PANIC ATTACKS  . Blood transfusion 1990'S  . Hypokalemia     PAST HX  . Abnormal Pap smear   . H/O varicella   . Pregnancy induced hypertension   . Depression 2010  . Weight loss 2010  . Increased BMI   . H/O dysmenorrhea   . Ovarian cyst 2011  . Fibroid 2011  . H/O: menorrhagia 2011  . BV (bacterial vaginosis)   . Perimenopausal symptoms 07/15/2010  . Sleep apnea     PT USES CPAP SOMETIMES - SETTING IS 3  . Kidney stones   . GERD (gastroesophageal reflux disease)     occasional  . Cough       nonproductive cough last 2 weeks    Medications:  Scheduled:    . acetaminophen  1,000 mg Intravenous Q6H  . aMILoride  10 mg Oral BID  . ampicillin (OMNIPEN) IV  1 g Intravenous Q6H  . buPROPion  150 mg Oral BID  . carvedilol  25 mg Oral BID WC  . ciprofloxacin  500 mg Oral BID  . cloNIDine  0.2 mg Oral TID  . fentaNYL      . hydrALAZINE      . hydrALAZINE  5 mg Intravenous Once  . hydrALAZINE  100 mg Oral TID  . insulin aspart  0-20 Units Subcutaneous TID WC  . insulin aspart  0-5 Units Subcutaneous QHS  . lidocaine      . lisinopril  40 mg Oral q morning - 10a  . loratadine  10 mg Oral Daily  . midazolam      . morphine      . potassium chloride  20 mEq Oral TID  . senna-docusate  1 tablet Oral BID  . sodium chloride  3 mL Intravenous Q12H  . DISCONTD: sodium chloride   Intravenous Once  . DISCONTD: potassium chloride  20 mEq Oral TID   Infusions:    . sodium chloride    . sodium chloride    . DISCONTD: lactated ringers 1,000 mL (10/15/11 1037)  . DISCONTD: lactated ringers  PRN: acetaminophen, ALPRAZolam, hyoscyamine, morphine injection, ondansetron, oxyCODONE, sodium chloride, DISCONTD: bupivacaine, DISCONTD:  HYDROmorphone (DILAUDID) injection, DISCONTD: iohexol, DISCONTD: meperidine (DEMEROL) injection, DISCONTD: promethazine, DISCONTD: sodium chloride irrigation, DISCONTD: sodium chloride irrigation Assessment: 41 yo Female with Left nephrolithiasis--S/P PCNL   Goal of Therapy:  Gentamicin trough level <45mcg/ml   Plan:  This patient received 1st dose of Gentamicin 430mg  at 1130 per pre-op protocol of 5mg /kg. This provides coverage for 24 hours which will also provide the synergy with the Ampicillin through the 4 th dose (final) due 1100 on 10/10. No further Gentamicin doses necessary.  Gypsy Decant 10/15/2011,4:08 PM

## 2011-10-15 NOTE — Anesthesia Preprocedure Evaluation (Signed)
Anesthesia Evaluation  Patient identified by MRN, date of birth, ID band Patient awake    Reviewed: Allergy & Precautions, H&P , NPO status , Patient's Chart, lab work & pertinent test results, reviewed documented beta blocker date and time   Airway Mallampati: II TM Distance: >3 FB Neck ROM: full    Dental  (+) Chipped and Dental Advisory Given,    Pulmonary neg pulmonary ROS, shortness of breath and with exertion, sleep apnea and Continuous Positive Airway Pressure Ventilation ,  breath sounds clear to auscultation  Pulmonary exam normal       Cardiovascular Exercise Tolerance: Good hypertension, Pt. on home beta blockers negative cardio ROS  Rhythm:regular Rate:Normal     Neuro/Psych PSYCHIATRIC DISORDERS Anxiety Depression negative neurological ROS  negative psych ROS   GI/Hepatic negative GI ROS, Neg liver ROS, GERD-  Medicated and Controlled,  Endo/Other  negative endocrine ROSMorbid obesity  Renal/GU Renal diseasenegative Renal ROS  negative genitourinary   Musculoskeletal   Abdominal   Peds  Hematology negative hematology ROS (+)   Anesthesia Other Findings   Reproductive/Obstetrics negative OB ROS                           Anesthesia Physical  Anesthesia Plan  ASA: III  Anesthesia Plan: General   Post-op Pain Management:    Induction: Intravenous  Airway Management Planned: Oral ETT  Additional Equipment:   Intra-op Plan:   Post-operative Plan: Extubation in OR  Informed Consent: I have reviewed the patients History and Physical, chart, labs and discussed the procedure including the risks, benefits and alternatives for the proposed anesthesia with the patient or authorized representative who has indicated his/her understanding and acceptance.   Dental Advisory Given  Plan Discussed with: CRNA and Surgeon  Anesthesia Plan Comments:         Anesthesia Quick  Evaluation

## 2011-10-15 NOTE — Preoperative (Signed)
Beta Blockers   Reason not to administer Beta Blockers:Not Applicable 

## 2011-10-15 NOTE — Progress Notes (Signed)
Post op check  Patient doing well. Pain controlled. I explained to the patient the surgical findings and the course of the surgery.  Filed Vitals:   10/15/11 1430  BP: 169/107  Pulse: 74  Temp: 97.2 F (36.2 C)  Resp: 13   Gen: NAD, awake Abd: soft, NTTP GU: Left flank dressed without soakage, left nephrostomy tube with scant blood output, urine in foley pink  Hgb: 11.9  A/P: Left nephrolithiasis. Left PCNL today -Observe overnight for pain control. -D/c foley catheter in the morning.

## 2011-10-15 NOTE — Transfer of Care (Signed)
Immediate Anesthesia Transfer of Care Note  Patient: Robyn [name]  Procedure(s) Performed: Procedure(s) (LRB) with comments: NEPHROLITHOTOMY PERCUTANEOUS (Left) REMOVAL OF STONES (Left)  Patient Location: PACU  Anesthesia Type: General  Level of Consciousness: awake, alert , oriented and patient cooperative  Airway & Oxygen Therapy: Patient Spontanous Breathing and Patient connected to face mask oxygen  Post-op Assessment: Report given to PACU RN, Post -op Vital signs reviewed and stable and Patient moving all extremities  Post vital signs: Reviewed and stable  Complications: No apparent anesthesia complications

## 2011-10-15 NOTE — H&P (Signed)
Urology History and Physical Exam  CC: Left nephrolithiasis  HPI: 41 year old female presents today for left nephrolithiasis. This has been present since at least January 2013. She underwent a right PCNL in April 2013 which completely cleared her of her stone burden. Her CT scan revealed a left lower pole stone which measured 1.4 x 1.6 cm in size. This was confirmed to be present on plain film x-ray in April 2013. It is occasionally associated with left flank pain. She denies fever or nausea.  We have discussed risks, benefits, alternatives, and likelihood of achieving goals. Urinalysis from 10/03/11 was negative for signs of infection.  She had placement of a left percutaneous nephroureteral catheter today by interventional radiology in preparation for today's procedure.  It is a lower pole access.  PMH: Past Medical History  Diagnosis Date  . Hypertension   . Shortness of breath     ONLY WITH ANXIETY  . Anxiety     OCCAS PANIC ATTACKS  . Blood transfusion 1990'S  . Hypokalemia     PAST HX  . Abnormal Pap smear   . H/O varicella   . Pregnancy induced hypertension   . Depression 2010  . Weight loss 2010  . Increased BMI   . H/O dysmenorrhea   . Ovarian cyst 2011  . Fibroid 2011  . H/O: menorrhagia 2011  . BV (bacterial vaginosis)   . Perimenopausal symptoms 07/15/2010  . Sleep apnea     PT USES CPAP SOMETIMES - SETTING IS 3  . Kidney stones   . GERD (gastroesophageal reflux disease)     occasional  . Cough     nonproductive cough last 2 weeks    PSH: Past Surgical History  Procedure Date  . Percutaneous nephrostomy around 1996 may 2013    right kidney  . Uterine ablation march 2010 march 2011  . C-sections x 2   . Nephrolithotomy 04/09/2011    Procedure: NEPHROLITHOTOMY PERCUTANEOUS;  Surgeon: Molli Hazard, MD;  Location: [facility];  Service: Urology;  Laterality: Right;      . Ureteroscopy 04/09/2011    Procedure: URETEROSCOPY;  Surgeon: Molli Hazard, MD;   Location: [facility];  Service: Urology;  Laterality: Right;  . Cholecystectomy 1993  . Tubal ligation 1999    Allergies: Allergies  Allergen Reactions  . Sulfa Antibiotics Hives and Rash    Medications: No prescriptions prior to admission     Social History: History   Social History  . Marital Status: Married    Spouse Name: N/A    Number of Children: N/A  . Years of Education: N/A   Occupational History  . Not on file.   Social History Main Topics  . Smoking status: Current Every Day Smoker -- 0.2 packs/day for 15 years    Types: Cigarettes  . Smokeless tobacco: Never Used  . Alcohol Use: Yes     OCCAS  . Drug Use: No  . Sexually Active:    Other Topics Concern  . Not on file   Social History Narrative  . No narrative on file    Family History: No family history on file.  Review of Systems: Positive: None. Negative: Chest pain, fever, SOB.  A further 10 point review of systems was negative except what is listed in the HPI.  Physical Exam:  General: No acute distress.  Awake. Head:  Normocephalic.  Atraumatic. ENT:  EOMI.  Mucous membranes moist Neck:  Supple.  No lymphadenopathy. Pulmonary: Equal effort bilaterally.  Clear  to auscultation bilaterally. Abdomen: Soft.  Non- tender to palpation. Skin:  Normal turgor.  No visible rash. Extremity: No gross deformity of bilateral upper extremities.  No gross deformity of    bilateral lower extremities. Neurologic: Alert. Appropriate mood.   Studies:  No results found for this basename: HGB:2,WBC:2,PLT:2 in the last 72 hours  No results found for this basename: NA:2,K:2,CL:2,CO2:2,BUN:2,CREATININE:2,CALCIUM:2,MAGNESIUM:2,GFRNONAA:2,GFRAA:2 in the last 72 hours   No results found for this basename: PT:2,INR:2,APTT:2 in the last 72 hours   No components found with this basename: ABG:2    Assessment:  Left nephrolithiasis  Plan: To OR for left PCNL.  She will receive ampicillin and gentamicin for  preoperative antibiotics.

## 2011-10-15 NOTE — H&P (Signed)
Robyn [name] is an 41 y.o. female.   Chief Complaint: left kidney stones HPI: Pt with history of left nephrolithiasis presents today for percutaneous nephroureteral cath placement prior to nephrolithotomy.  Past Medical History  Diagnosis Date  . Hypertension   . Shortness of breath     ONLY WITH ANXIETY  . Anxiety     OCCAS PANIC ATTACKS  . Blood transfusion 1990'S  . Hypokalemia     PAST HX  . Abnormal Pap smear   . H/O varicella   . Pregnancy induced hypertension   . Depression 2010  . Weight loss 2010  . Increased BMI   . H/O dysmenorrhea   . Ovarian cyst 2011  . Fibroid 2011  . H/O: menorrhagia 2011  . BV (bacterial vaginosis)   . Perimenopausal symptoms 07/15/2010  . Sleep apnea     PT USES CPAP SOMETIMES - SETTING IS 3  . Kidney stones   . GERD (gastroesophageal reflux disease)     occasional  . Cough     nonproductive cough last 2 weeks    Past Surgical History  Procedure Date  . Percutaneous nephrostomy around 1996 may 2013    right kidney  . Uterine ablation march 2010 march 2011  . C-sections x 2   . Nephrolithotomy 04/09/2011    Procedure: NEPHROLITHOTOMY PERCUTANEOUS;  Surgeon: Molli Hazard, MD;  Location: [facility];  Service: Urology;  Laterality: Right;      . Ureteroscopy 04/09/2011    Procedure: URETEROSCOPY;  Surgeon: Molli Hazard, MD;  Location: [facility];  Service: Urology;  Laterality: Right;  . Cholecystectomy 1993  . Tubal ligation 1999    No family history on file. Social History:  reports that she has been smoking Cigarettes.  She has a 3.75 pack-year smoking history. She has never used smokeless tobacco. She reports that she drinks alcohol. She reports that she does not use illicit drugs.  Allergies:  Allergies  Allergen Reactions  . Sulfa Antibiotics Hives and Rash    Current outpatient prescriptions:ALPRAZolam (XANAX) 0.5 MG tablet, Take 1 tablet (0.5 mg total) by mouth at bedtime as needed for sleep., Disp: 30 tablet,  Rfl: 1;  aMILoride (MIDAMOR) 5 MG tablet, Take 10 mg by mouth 2 (two) times daily. , Disp: , Rfl: ;  aspirin 325 MG tablet, Take 325 mg by mouth daily., Disp: , Rfl: ;  buPROPion (WELLBUTRIN SR) 150 MG 12 hr tablet, Take 150 mg by mouth 2 (two) times daily., Disp: , Rfl:  carvedilol (COREG) 25 MG tablet, Take 25 mg by mouth 2 (two) times daily with a meal., Disp: , Rfl: ;  cloNIDine (CATAPRES) 0.2 MG tablet, Take 1 tablet (0.2 mg total) by mouth 3 (three) times daily., Disp: 60 tablet, Rfl: 0;  fexofenadine (ALLEGRA) 180 MG tablet, Take 180 mg by mouth daily as needed. For allergies, Disp: , Rfl:  hydrALAZINE (APRESOLINE) 100 MG tablet, Take 1 tablet (100 mg total) by mouth 3 (three) times daily., Disp: 90 tablet, Rfl: 2;  lisinopril (PRINIVIL,ZESTRIL) 40 MG tablet, Take 40 mg by mouth every morning., Disp: , Rfl: ;  Multiple Vitamins-Minerals (MULTIVITAMIN WITH MINERALS) tablet, Take 1 tablet by mouth daily., Disp: , Rfl: ;  potassium chloride (KLOR-CON) 20 MEQ packet, Take 20 mEq by mouth 3 (three) times daily., Disp: , Rfl:  potassium chloride SA (K-DUR,KLOR-CON) 20 MEQ tablet, Take 1 tablet (20 mEq total) by mouth 3 (three) times daily., Disp: 90 tablet, Rfl: 2;  vitamin B-12 (CYANOCOBALAMIN)  500 MCG tablet, Take 500 mcg by mouth daily., Disp: , Rfl:  Current facility-administered medications:0.9 %  sodium chloride infusion, , Intravenous, Once, Lavonia Drafts, PA;  ampicillin (OMNIPEN) 2 g in sodium chloride 0.9 % 50 mL IVPB, 2 g, Intravenous, 30 min Pre-Op, Molli Hazard, MD;  ciprofloxacin (CIPRO) IVPB 400 mg, 400 mg, Intravenous, Once, Lavonia Drafts, PA;  gentamicin (GARAMYCIN) 430 mg in dextrose 5 % 50 mL IVPB, 430 mg, Intravenous, 30 min Pre-Op, Molli Hazard, MD DISCONTD: ceFAZolin (ANCEF) IVPB 2 g/50 mL premix, 2 g, Intravenous, Once, Lavonia Drafts, PA Results for orders placed during the hospital encounter of 10/09/11  SURGICAL PCR SCREEN      Component Value Range   MRSA,  PCR NEGATIVE  NEGATIVE   Staphylococcus aureus NEGATIVE  NEGATIVE  HCG, SERUM, QUALITATIVE      Component Value Range   Preg, Serum NEGATIVE  NEGATIVE  APTT      Component Value Range   aPTT 36  24 - 37 seconds  BASIC METABOLIC PANEL      Component Value Range   Sodium 137  135 - 145 mEq/L   Potassium 3.2 (*) 3.5 - 5.1 mEq/L   Chloride 100  96 - 112 mEq/L   CO2 24  19 - 32 mEq/L   Glucose, Bld 114 (*) 70 - 99 mg/dL   BUN 19  6 - 23 mg/dL   Creatinine, Ser 1.43 (*) 0.50 - 1.10 mg/dL   Calcium 9.1  8.4 - 10.5 mg/dL   GFR calc non Af Amer 45 (*) >90 mL/min   GFR calc Af Amer 52 (*) >90 mL/min  CBC      Component Value Range   WBC 7.9  4.0 - 10.5 K/uL   RBC 3.66 (*) 3.87 - 5.11 MIL/uL   Hemoglobin 11.9 (*) 12.0 - 15.0 g/dL   HCT 33.9 (*) 36.0 - 46.0 %   MCV 92.6  78.0 - 100.0 fL   MCH 32.5  26.0 - 34.0 pg   MCHC 35.1  30.0 - 36.0 g/dL   RDW 12.9  11.5 - 15.5 %   Platelets 269  150 - 400 K/uL  PROTIME-INR      Component Value Range   Prothrombin Time 12.9  11.6 - 15.2 seconds   INR 0.98  0.00 - 1.49    Review of Systems  Constitutional: Negative for fever and chills.  Respiratory: Positive for cough. Negative for shortness of breath.   Cardiovascular: Negative for chest pain.  Gastrointestinal: Negative for nausea, vomiting and abdominal pain.  Genitourinary: Negative for hematuria.       Mild left flank discomfort  Neurological: Negative for headaches.  Endo/Heme/Allergies: Does not bruise/bleed easily.   Filed Vitals:   10/15/11 0856  BP: 169/100  Pulse: 70  Temp: 97.9 F (36.6 C)  TempSrc: Oral  Resp: 16  Height: 5\' 6"  (1.676 m)  Weight: 276 lb (125.193 kg)  SpO2: 96%    Physical Exam  Constitutional: She is oriented to person, place, and time. She appears well-developed and well-nourished.  Cardiovascular: Normal rate and regular rhythm.   Respiratory: Effort normal and breath sounds normal.  GI: Soft. Bowel sounds are normal. There is no tenderness.        obese  Musculoskeletal: Normal range of motion. She exhibits edema.  Neurological: She is alert and oriented to person, place, and time.     Assessment/Plan Pt with left nephrolithiasis; plan is for left nephroureteral  cath placement prior to nephrolithotomy today. Details/risks of procedure d/w pt/husband with their understanding and consent.  Jony Ladnier,D KEVIN 10/15/2011, 8:48 AM

## 2011-10-15 NOTE — Anesthesia Postprocedure Evaluation (Signed)
  Anesthesia Post-op Note  Patient: Robyn [name]  Procedure(s) Performed: Procedure(s) (LRB): NEPHROLITHOTOMY PERCUTANEOUS (Left) REMOVAL OF STONES (Left)  Patient Location: PACU  Anesthesia Type: General  Level of Consciousness: awake and alert   Airway and Oxygen Therapy: Patient Spontanous Breathing  Post-op Pain: mild  Post-op Assessment: Post-op Vital signs reviewed, Patient's Cardiovascular Status Stable, Respiratory Function Stable, Patent Airway and No signs of Nausea or vomiting  Post-op Vital Signs: stable  Complications: No apparent anesthesia complications

## 2011-10-16 ENCOUNTER — Encounter (HOSPITAL_COMMUNITY): Payer: Self-pay | Admitting: Urology

## 2011-10-16 LAB — GLUCOSE, CAPILLARY
Glucose-Capillary: 129 mg/dL — ABNORMAL HIGH (ref 70–99)
Glucose-Capillary: 167 mg/dL — ABNORMAL HIGH (ref 70–99)

## 2011-10-16 LAB — BASIC METABOLIC PANEL
BUN: 16 mg/dL (ref 6–23)
Calcium: 8.7 mg/dL (ref 8.4–10.5)
Chloride: 101 mEq/L (ref 96–112)
Creatinine, Ser: 1.25 mg/dL — ABNORMAL HIGH (ref 0.50–1.10)
GFR calc Af Amer: 61 mL/min — ABNORMAL LOW (ref >90)
GFR calc non Af Amer: 53 mL/min — ABNORMAL LOW (ref >90)

## 2011-10-16 LAB — HEMOGLOBIN AND HEMATOCRIT, BLOOD: Hemoglobin: 11.9 g/dL — ABNORMAL LOW (ref 12.0–15.0)

## 2011-10-16 MED ORDER — SENNOSIDES-DOCUSATE SODIUM 8.6-50 MG PO TABS: 1.0000 | ORAL_TABLET | Freq: Two times a day (BID) | ORAL | Status: DC

## 2011-10-16 MED ORDER — OXYCODONE HCL 5 MG PO TABS: 5.0000 mg | ORAL_TABLET | ORAL | Status: DC | PRN

## 2011-10-16 MED ORDER — HYOSCYAMINE SULFATE 0.125 MG SL SUBL: 0.1250 mg | SUBLINGUAL_TABLET | SUBLINGUAL | Status: DC | PRN

## 2011-10-16 MED ORDER — CIPROFLOXACIN HCL 500 MG PO TABS: 500.0000 mg | ORAL_TABLET | Freq: Two times a day (BID) | ORAL | Status: DC

## 2011-10-16 NOTE — Progress Notes (Signed)
This patient has tolerated the capping of her nephrostomy tube well without any increased pain.  Her 41 French left nephrostomy tube was removed today. I observed this for several minutes. There was not any return of blood. Next I removed her nephroureteral catheter with ease. Her left percutaneous operation site was dressed with dry gauze, an ABD pad, and covered with Hypafix tape.  I advised that they change dressing at least once daily and p.r.n. I warned that she could extremes leakage for up to 14 days.  The patient is ready for discharge home.

## 2011-10-16 NOTE — Op Note (Signed)
NAMEMarland Kitchen  Robyn, [name] NO.:  [id number]  MEDICAL RECORD NO.:  [id number]  LOCATION:  [id number]                         FACILITY:  [facility]  PHYSICIAN:  Rolan Bucco, MD    DATE OF BIRTH:  Jul 18, 1970  DATE OF PROCEDURE:  10/15/2011 DATE OF DISCHARGE:                              OPERATIVE REPORT   SURGEON:  Rolan Bucco, MD  ASSISTANT:  None.  PREOPERATIVE DIAGNOSIS:  Left nephrolithiasis less than 2 cm in size.  POSTOPERATIVE DIAGNOSIS:  Left nephrolithiasis less than 2 cm in size.  PROCEDURES PERFORMED: 1. Left percutaneous nephrostolithotomy for stone less than 2 cm in     size. 2. Dilation of left nephrostomy tube tract. 3. Left antegrade pyelogram with interpretation. 4. Left nephrostomy tube placement.  COMPLICATIONS:  None.  ESTIMATED BLOOD LOSS:  50 mL.  DRAINS:  Left nephroureteral catheter and left nephrostomy tube.  SPECIMEN:  Stones were removed and provided to the patient.  FINDINGS:  Large stone burden in the lower pole of the left kidney.  HISTORY OF PRESENT ILLNESS:  This is a 41 year old female who had bilateral nephrolithiasis.  She was previously treated with a right- sided percutaneous nephrostolithotomy due to large stone burden.  She had a remaining stone in her left kidney in the lower pole that was approximately 1.6 cm in largest diameter.  After review of the risks and benefits, she elected to have a left percutaneous nephrostolithotomy. She presented today and had a left nephroureteral catheter placed percutaneously by Interventional Radiology and then presented to the operating room for our portion of the procedure.  PROCEDURE:  After informed consent was obtained, the patient was taken to the operating room where she was placed in supine position on the gurney.  SCDs were in place and turned on prior to the induction of anesthesia.  IV antibiotics were infused and general anesthesia was induced.  Foley catheter was placed  into the bladder using sterile technique. She was then placed in a prone position on the operating room table making these large gel rolls that were perpendicular to the patient's body.  Gel padding was used on all extremities and she was positioned to pad all pertinent neurovascular pressure points.  There was no pressure on her breast or her pannus.  After she was positioned appropriately with a physiologic bend in her knees, she was secured to the table and then her left flank and nephrostomy tube were prepped and draped in usual sterile fashion.  A time-out was performed, in which, the correct patient, surgical site, and procedure were identified and agreed upon by the team.  Next, I cut the tip off the nephrostomy tube and placed a superstiff wire down this tube and into the bladder, which was seen on fluoroscopy. Next, I removed the nephroureteral catheter and made an incision in the skin and made a nick in the fascia with a 15-blade scalpel.  After this was done, a dual-lumen ureteral access sheath was placed and I injected contrast through this to obtain an antegrade nephrostogram.  I did not advance this all the way into the collecting system and so, there was some contrast that outlined the inferior pole of the kidney.  I then advanced this further into the collecting system and injected the contrast and I was able to obtain left antegrade pyelogram.  I moved this to the right side if the monitor and used for future reference.  I then placed a second superstiff wire through the dual-lumen access sheath into the bladder on fluoroscopy.  One wire was used as a Chiropodist, the other was used as a working wire.  I then placed the balloon dilating device to where the tip was adjacent to the stone and inflated this under fluoroscopy to 12 atmospheres and held this for 3 minutes.  After this, I placed the extra long sheath over this device under fluoroscopy and then deflated the  balloon.  There was no return of blood.  This device was removed and the working wire was secured to the drape as well.  The rigid nephroscope was advanced through the sheath and the stone was immediately visible.  It was grasped with a 3-prong grasper and then a stone crusher.  There was a fragment that was too large to pull through the sheath and therefore, the lithotome was used to perform lithotripsy with the ultrasonic power.  After this was done, all the fragments were removed with the 3-prong grasper.  There were no more fragments left throughout the kidney.  Flexible nephroscope was then placed through the sheath and I evaluated the lower kidney to the ureteropelvic junction and there were no stone fragments.  This completed the procedure for the stone portion and therefore, I pulled the scope out and I placed a 5- [place] ureteral access sheath over the safety wire and this was down into the distal ureter, and I sutured this into the patient's skin with a silk suture.  Next, I placed a 26-French Council-tip catheter over the wire through the access sheath and found that to be in good position by shooting an antegrade nephrostogram by injecting contrast through it.  Three mL of sterile water was placed into the balloon and then the access sheath was removed.  The nephrostomy tube was secured into place with silk suture.  This was placed to dependent drainage and the wires were all removed.  A knot was tied in the nephroureteral access sheath to prevent drainage and then, the surgical site was dressed after I injected 30 mL of Marcaine.  I did withdrawal each time before I injected to ensure I did not inject into any vasculature.  This completed the procedure.  She was placed back in supine position.  Anesthesia was reversed.  She was taken to the PACU in stable condition.  She will be kept overnight for observation.          ______________________________ Rolan Bucco,  MD     DW/MEDQ  D:  10/15/2011  T:  10/16/2011  Job:  [id number]

## 2011-10-16 NOTE — Care Management Note (Signed)
    Page 1 of 1   10/16/2011     1:54:45 PM   CARE MANAGEMENT NOTE 10/16/2011  Patient:  Robyn [name], Robyn [name]   Account Number:  [id number]  Date Initiated:  10/16/2011  Documentation initiated by:  Dessa Phi  Subjective/Objective Assessment:   ADMITTED W/L NEPHROLITHIASIS.     Action/Plan:   FROM HOME   Anticipated DC Date:  10/16/2011   Anticipated DC Plan:  [place]  CM consult      Choice offered to / List presented to:             Status of service:  Completed, signed off Medicare Important Message given?   (If response is "NO", the following Medicare IM given date fields will be blank) Date Medicare IM given:   Date Additional Medicare IM given:    Discharge Disposition:  HOME/SELF CARE  Per UR Regulation:  Reviewed for med. necessity/level of care/duration of stay  If discussed at [place] of Stay Meetings, dates discussed:    Comments:  10/16/11 Mashawn Brazil RN,BSN NCM [phone] POD#1 L PCN.NO D/C NEEDS.

## 2011-10-16 NOTE — Discharge Summary (Signed)
Physician Discharge Summary  Patient ID: Robyn [name] MRN: [id number] DOB/AGE: 05-19-1970 41 y.o.  Admit date: 10/15/2011 Discharge date: 10/16/2011  Admission Diagnoses: Left nephrolithiasis  Discharge Diagnoses:  Principal Problem:  *Nephrolithiasis   Discharged Condition: good  Hospital Course:  This patient was admitted postoperatively for observation and pain control following left percutaneous nephrostolithotomy. She was able to ambulate, tolerate a regular diet, and control her pain with pain medication. The following day she was able to void after her catheter was removed. Her nephrostomy tube was clamped for several hours without any increased pain. Her nephrostomy tube was removed without any return of hemorrhage. Her left nephroureteral stent was then removed. She felt that she could be discharged home and I agreed.   She was discharged home with ciprofloxacin 500 mg twice daily for 5 days. She was also given Percocet, 60 tablets for pain. She was given Levsin for any bladder spasms. She was given Senokot to keep her bowels moving. She was restricted to hold her aspirin for 7 days. Hold all vitamins for 7 days. Resume all other previous medications.  Consults: the patient had a left percutaneous nephroureteral catheter placed by interventional radiology prior to her surgery on the day of surgery, 10/15/11.  Significant Diagnostic Studies: labs: Hgb 11.9  Treatments: surgery: Left percutaneous nephrostolithotomy.  Discharge Exam: Blood pressure 151/85, pulse 64, temperature 97.5 F (36.4 C), temperature source Oral, resp. rate 18, height 5\' 6"  (1.676 m), weight 125.2 kg (276 lb 0.3 oz), SpO2 97.00%. See PE from progress note on date of discharge.  Disposition: 01-Home or Self Care  Discharge Orders    Future Appointments: Provider: Department: Dept Phone: Center:   10/17/2011 4:00 PM [name], MD [facility] [phone] [facility]   10/20/2011 9:30 AM  [name], MD [facility] [phone] None     Future Orders Please Complete By Expires   Discharge patient          Med List    Warning       Cannot display patient medications because the patient has not yet arrived.            Follow-up Information    Follow up with [facility], NP. On 10/30/2011. (1:00 pm)    Contact information:   [place] [address] [phone]          Signed: Molli Hazard 10/16/2011, 12:57 PM

## 2011-10-16 NOTE — Progress Notes (Signed)
Patient   Discharge to home husband at bedside, nephrostomy tube removed by Dr. Jasmine December, dressing dry intact. Patient no complaints of pain or discomfort upon discharge. PIV removed no s/s of swelling or infiltration noted. D/C intruction and follow up appointment  done and given to the patient.

## 2011-10-16 NOTE — Progress Notes (Signed)
Urology Progress Note  Subjective:     No acute urologic events overnight. Patient's catheter was removed this morning and she was able to void. She has been ambulating without problems. Tolerating regular diet. Pain is controlled with oral pain medications. The nephrostomy tube with minimal output.  ROS: Negative: Chest pain  Objective:  Patient Vitals for the past 24 hrs:  BP Temp Temp src Pulse Resp SpO2 Height Weight  10/16/11 0605 151/85 mmHg 97.5 F (36.4 C) Oral 64  18  97 % - -  10/16/11 0215 160/89 mmHg 98.2 F (36.8 C) Oral 67  18  100 % - -  10/15/11 2209 158/98 mmHg 98 F (36.7 C) Oral 65  18  97 % - -  10/15/11 1801 156/94 mmHg 97.2 F (36.2 C) Oral 73  18  96 % - -  10/15/11 1624 160/102 mmHg - - - - - - -  10/15/11 1531 159/109 mmHg 97.6 F (36.4 C) Oral 78  18  97 % 5\' 6"  (1.676 m) 125.2 kg (276 lb 0.3 oz)  10/15/11 1500 - - - - - - 5' 6.14" (1.68 m) 125.2 kg (276 lb 0.3 oz)  10/15/11 1447 159/109 mmHg 97.9 F (36.6 C) - 78  18  97 % - -  10/15/11 1430 169/107 mmHg 97.2 F (36.2 C) - 74  13  97 % - -  10/15/11 1417 178/110 mmHg - - - - - - -  10/15/11 1415 178/110 mmHg - - 74  12  98 % - -  10/15/11 1400 189/105 mmHg - - 72  9  100 % - -  10/15/11 1345 202/122 mmHg - - 69  7  100 % - -  10/15/11 1342 202/121 mmHg - - - - - - -  10/15/11 1326 196/122 mmHg 97 F (36.1 C) - 88  12  100 % - -    Physical Exam: General:  No acute distress, awake Cardiovascular:    [x]   S1/S2 present, RRR  []   Irregularly irregular Chest:  CTA-B Abdomen:               []  Soft, appropriately TTP  [x]  Soft, NTTP  []  Soft, appropriately TTP, incision(s) clean/dry/intact  Genitourinary: No catheter. Left flank dressing in place with dry soakage from the previous day. Left nephrostomy tube and nephroureter catheter in place with scant bloody drainage.     I/O last 3 completed shifts: In: 2067 [P.O.:342; I.V.:1275; IV Piggyback:450] Out: [id number] [Urine:3272; Blood:100]  Recent  Labs  [facility] 10/16/11 0434 10/15/11 1347   HGB 11.9* 11.9*   WBC -- --   PLT -- --    Recent Labs  [facility] 10/16/11 0434   NA 136   K 4.6   CL 101   CO2 21   BUN 16   CREATININE 1.25*   CALCIUM 8.7   GFRNONAA 53*   GFRAA 61*     No results found for this basename: PT:2,INR:2,APTT:2 in the last 72 hours   No components found with this basename: ABG:2    Length of stay: 1 days.  Assessment: Left nephrolithiasis. POD#1 left percutaneous nephrostolithotomy  Plan: -Cap nephrostomy tube. -Continue ambulation. -Saline lock IV. -Possible discharge home today if tolerates capping of tube.   Rolan Bucco, MD [phone]

## 2011-10-16 NOTE — Progress Notes (Signed)
Patient ambulated approx. 800 ft.  Tolerated well.  Patient foley catheter removed per MD order.  Patient tolerated well.  Patient voided 50 ccs of clear, bloody urine.  Patient said she will attempt to void again after a little while.  Will continue to monitor.

## 2011-10-17 ENCOUNTER — Encounter: Payer: Self-pay | Admitting: Family Medicine

## 2011-10-17 ENCOUNTER — Ambulatory Visit (INDEPENDENT_AMBULATORY_CARE_PROVIDER_SITE_OTHER): Payer: 59 | Admitting: Family Medicine

## 2011-10-17 VITALS — BP 146/97 | HR 97 | Temp 98.4°F | Resp 18 | Ht 65.0 in | Wt 282.0 lb

## 2011-10-17 DIAGNOSIS — F17200 Nicotine dependence, unspecified, uncomplicated: Secondary | ICD-10-CM

## 2011-10-17 DIAGNOSIS — I10 Essential (primary) hypertension: Secondary | ICD-10-CM

## 2011-10-17 MED ORDER — VARENICLINE TARTRATE 1 MG PO TABS: 1.0000 mg | ORAL_TABLET | Freq: Two times a day (BID) | ORAL | Status: DC

## 2011-10-17 MED ORDER — VARENICLINE TARTRATE 0.5 MG PO TABS: 0.5000 mg | ORAL_TABLET | Freq: Two times a day (BID) | ORAL | Status: DC

## 2011-10-17 NOTE — Patient Instructions (Signed)

## 2011-10-17 NOTE — Progress Notes (Signed)
S:  41 y.o. AA female who was recently hospitalized with nephrolithotomy procedure per Dr. Jasmine December. She is a smoker and has been taking Bupropion for cessation; she feels it is not as effective as Chantix was. She smokes 5-7 cigs in evening after work. She is ready to quit and wants to try Chantix again.   Also has Severe HTN, on 5 medications. Is scheduled to see Cardiologist next week. Having HAs, palpitations and "just doesn't feel good" when BP is elevated.  ROS: Having pain related to recent procedure; mild-moderate fatigue. Negative for fever/chills, diaphoresis, visual disturbances,CP or tightness, cough or SOB, lightheadedness, numbness, weakness or syncope.  O:  Filed Vitals:       This is second reading after pt had been sitting for awhile   10/17/11 1703  BP: 146/97  Pulse: 97  Temp:   Resp:    GEN: In NAD: WN,WD. COR: RRR. No edema. LUNGS: CTA but pt not taking deep inspirations due to pain. No wheezes or rhonchi. NEURO: A&O x 3; CNs intact. Otherwise nonfocal.  A/P:  1. Tobacco use disorder      Discontinue Bupropion; RX: Chantix starter pack followed by maintenance for 2 months. Pt understands that she needs to pick a "Quit Date" and commit to cessation.  2. HTN (hypertension), accelerated      Improved readings after 20-30 minutes; continue current medications. Follow-up with Cardiologist as scheduled.

## 2011-10-20 ENCOUNTER — Ambulatory Visit: Payer: 59 | Admitting: Obstetrics and Gynecology

## 2011-10-28 ENCOUNTER — Ambulatory Visit: Payer: 59 | Admitting: Obstetrics and Gynecology

## 2011-12-11 ENCOUNTER — Ambulatory Visit: Payer: 59 | Admitting: Obstetrics and Gynecology

## 2011-12-17 ENCOUNTER — Ambulatory Visit: Payer: 59 | Admitting: Obstetrics and Gynecology

## 2011-12-28 ENCOUNTER — Encounter (HOSPITAL_COMMUNITY): Payer: Self-pay

## 2011-12-28 ENCOUNTER — Emergency Department (HOSPITAL_COMMUNITY): Payer: 59

## 2011-12-28 ENCOUNTER — Inpatient Hospital Stay (HOSPITAL_COMMUNITY)
Admission: EM | Admit: 2011-12-28 | Discharge: 2012-01-01 | DRG: 153 | Disposition: A | Discharge status: Home / Self Care | Payer: 59 | Attending: Internal Medicine | Admitting: Internal Medicine

## 2011-12-28 DIAGNOSIS — K219 Gastro-esophageal reflux disease without esophagitis: Secondary | ICD-10-CM | POA: Diagnosis present

## 2011-12-28 DIAGNOSIS — N200 Calculus of kidney: Secondary | ICD-10-CM | POA: Diagnosis present

## 2011-12-28 DIAGNOSIS — E876 Hypokalemia: Secondary | ICD-10-CM

## 2011-12-28 DIAGNOSIS — D638 Anemia in other chronic diseases classified elsewhere: Secondary | ICD-10-CM | POA: Diagnosis present

## 2011-12-28 DIAGNOSIS — I10 Essential (primary) hypertension: Secondary | ICD-10-CM

## 2011-12-28 DIAGNOSIS — N183 Chronic kidney disease, stage 3 unspecified: Secondary | ICD-10-CM

## 2011-12-28 DIAGNOSIS — F419 Anxiety disorder, unspecified: Secondary | ICD-10-CM

## 2011-12-28 DIAGNOSIS — Z79899 Other long term (current) drug therapy: Secondary | ICD-10-CM

## 2011-12-28 DIAGNOSIS — N179 Acute kidney failure, unspecified: Secondary | ICD-10-CM | POA: Diagnosis present

## 2011-12-28 DIAGNOSIS — N184 Chronic kidney disease, stage 4 (severe): Secondary | ICD-10-CM | POA: Diagnosis present

## 2011-12-28 DIAGNOSIS — F411 Generalized anxiety disorder: Secondary | ICD-10-CM | POA: Diagnosis present

## 2011-12-28 DIAGNOSIS — G4730 Sleep apnea, unspecified: Secondary | ICD-10-CM | POA: Diagnosis present

## 2011-12-28 DIAGNOSIS — R112 Nausea with vomiting, unspecified: Secondary | ICD-10-CM

## 2011-12-28 DIAGNOSIS — F17200 Nicotine dependence, unspecified, uncomplicated: Secondary | ICD-10-CM | POA: Diagnosis present

## 2011-12-28 DIAGNOSIS — I129 Hypertensive chronic kidney disease with stage 1 through stage 4 chronic kidney disease, or unspecified chronic kidney disease: Secondary | ICD-10-CM | POA: Diagnosis present

## 2011-12-28 DIAGNOSIS — Z720 Tobacco use: Secondary | ICD-10-CM | POA: Diagnosis present

## 2011-12-28 DIAGNOSIS — J111 Influenza due to unidentified influenza virus with other respiratory manifestations: Principal | ICD-10-CM | POA: Diagnosis present

## 2011-12-28 LAB — BASIC METABOLIC PANEL
BUN: 12 mg/dL (ref 6–23)
CO2: 33 mEq/L — ABNORMAL HIGH (ref 19–32)
Calcium: 9.2 mg/dL (ref 8.4–10.5)
Chloride: 92 mEq/L — ABNORMAL LOW (ref 96–112)
Creatinine, Ser: 1.17 mg/dL — ABNORMAL HIGH (ref 0.50–1.10)
GFR calc Af Amer: 67 mL/min — ABNORMAL LOW (ref >90)
GFR calc non Af Amer: 57 mL/min — ABNORMAL LOW (ref >90)
Glucose, Bld: 150 mg/dL — ABNORMAL HIGH (ref 70–99)
Potassium: 2 mEq/L — CL (ref 3.5–5.1)
Sodium: 139 mEq/L (ref 135–145)

## 2011-12-28 LAB — CBC WITH DIFFERENTIAL/PLATELET
Basophils Absolute: 0 10*3/uL (ref 0.0–0.1)
Basophils Relative: 0 % (ref 0–1)
HCT: 34 % — ABNORMAL LOW (ref 36.0–46.0)
Lymphocytes Relative: 11 % — ABNORMAL LOW (ref 12–46)
MCHC: 35.3 g/dL (ref 30.0–36.0)
Monocytes Absolute: 0.5 10*3/uL (ref 0.1–1.0)
Neutro Abs: 6.3 10*3/uL (ref 1.7–7.7)
Platelets: 217 10*3/uL (ref 150–400)
RDW: 13.5 % (ref 11.5–15.5)
WBC: 7.8 10*3/uL (ref 4.0–10.5)

## 2011-12-28 LAB — URINE MICROSCOPIC-ADD ON

## 2011-12-28 LAB — APTT: aPTT: 41 seconds — ABNORMAL HIGH (ref 24–37)

## 2011-12-28 LAB — PHOSPHORUS: Phosphorus: 3.5 mg/dL (ref 2.3–4.6)

## 2011-12-28 LAB — CBC
HCT: 36.5 % (ref 36.0–46.0)
Hemoglobin: 13 g/dL (ref 12.0–15.0)
MCH: 33.2 pg (ref 26.0–34.0)
MCHC: 35.6 g/dL (ref 30.0–36.0)
MCV: 93.4 fL (ref 78.0–100.0)
Platelets: 246 10*3/uL (ref 150–400)
RBC: 3.91 MIL/uL (ref 3.87–5.11)
RDW: 13.4 % (ref 11.5–15.5)
WBC: 9.1 10*3/uL (ref 4.0–10.5)

## 2011-12-28 LAB — COMPREHENSIVE METABOLIC PANEL
Albumin: 3.3 g/dL — ABNORMAL LOW (ref 3.5–5.2)
GFR calc Af Amer: 60 mL/min — ABNORMAL LOW (ref >90)
GFR calc non Af Amer: 52 mL/min — ABNORMAL LOW (ref >90)
Potassium: 2.3 mEq/L — CL (ref 3.5–5.1)
Sodium: 140 mEq/L (ref 135–145)
Total Bilirubin: 1 mg/dL (ref 0.3–1.2)
Total Protein: 7 g/dL (ref 6.0–8.3)

## 2011-12-28 LAB — URINALYSIS, ROUTINE W REFLEX MICROSCOPIC
Bilirubin Urine: NEGATIVE
Glucose, UA: NEGATIVE mg/dL
Hgb urine dipstick: NEGATIVE
Ketones, ur: NEGATIVE mg/dL
Leukocytes, UA: NEGATIVE
Nitrite: NEGATIVE
Protein, ur: >300 mg/dL — AB
Specific Gravity, Urine: 1.021 (ref 1.005–1.030)
Urobilinogen, UA: 1 mg/dL (ref 0.0–1.0)
pH: 6 (ref 5.0–8.0)

## 2011-12-28 LAB — MAGNESIUM
Magnesium: 1.4 mg/dL — ABNORMAL LOW (ref 1.5–2.5)
Magnesium: 1.9 mg/dL (ref 1.5–2.5)

## 2011-12-28 LAB — PROTIME-INR: INR: 1.05 (ref 0.00–1.49)

## 2011-12-28 MED ORDER — HYDROCODONE-ACETAMINOPHEN 5-325 MG PO TABS
1.0000 | ORAL_TABLET | ORAL | Status: DC | PRN
  Administered 2011-12-28: 1 via ORAL
  Administered 2011-12-28 – 2011-12-29 (×3): 2 via ORAL
  Administered 2011-12-30 (×2): 1 via ORAL
  Administered 2011-12-31: 2 via ORAL
  Filled 2011-12-28 (×2): qty 1
  Filled 2011-12-28 (×3): qty 2
  Filled 2011-12-28: qty 1
  Filled 2011-12-28: qty 2

## 2011-12-28 MED ORDER — MULTI-VITAMIN/MINERALS PO TABS
1.0000 | ORAL_TABLET | Freq: Every day | ORAL | Status: DC
Start: 2011-12-28 — End: 2011-12-28

## 2011-12-28 MED ORDER — HYDRALAZINE HCL 25 MG PO TABS
25.0000 mg | ORAL_TABLET | Freq: Three times a day (TID) | ORAL | Status: DC
  Administered 2011-12-28 – 2011-12-29 (×3): 25 mg via ORAL
  Filled 2011-12-28 (×8): qty 1

## 2011-12-28 MED ORDER — CARVEDILOL 25 MG PO TABS
25.0000 mg | ORAL_TABLET | Freq: Two times a day (BID) | ORAL | Status: DC
  Administered 2011-12-28 – 2012-01-01 (×9): 25 mg via ORAL
  Filled 2011-12-28 (×11): qty 1

## 2011-12-28 MED ORDER — POTASSIUM CHLORIDE CRYS ER 20 MEQ PO TBCR
20.0000 meq | EXTENDED_RELEASE_TABLET | Freq: Three times a day (TID) | ORAL | Status: DC
  Administered 2011-12-28 – 2011-12-30 (×8): 20 meq via ORAL
  Filled 2011-12-28 (×11): qty 1

## 2011-12-28 MED ORDER — MAGNESIUM SULFATE 40 MG/ML IJ SOLN
2.0000 g | Freq: Once | INTRAMUSCULAR | Status: AC
  Administered 2011-12-28: 2 g via INTRAVENOUS
  Filled 2011-12-28: qty 50

## 2011-12-28 MED ORDER — ACETAMINOPHEN 325 MG PO TABS
650.0000 mg | ORAL_TABLET | Freq: Four times a day (QID) | ORAL | Status: DC | PRN
  Administered 2011-12-29 – 2011-12-31 (×2): 650 mg via ORAL
  Filled 2011-12-28 (×2): qty 2

## 2011-12-28 MED ORDER — ADULT MULTIVITAMIN W/MINERALS CH
1.0000 | ORAL_TABLET | Freq: Every day | ORAL | Status: DC
  Administered 2011-12-28 – 2012-01-01 (×5): 1 via ORAL
  Filled 2011-12-28 (×5): qty 1

## 2011-12-28 MED ORDER — SODIUM CHLORIDE 0.9 % IV BOLUS (SEPSIS)
1000.0000 mL | Freq: Once | INTRAVENOUS | Status: AC
  Administered 2011-12-28: 1000 mL via INTRAVENOUS

## 2011-12-28 MED ORDER — POTASSIUM CHLORIDE 10 MEQ/100ML IV SOLN
10.0000 meq | INTRAVENOUS | Status: AC
  Administered 2011-12-28 (×3): 10 meq via INTRAVENOUS
  Filled 2011-12-28 (×3): qty 100

## 2011-12-28 MED ORDER — GUAIFENESIN ER 600 MG PO TB12
1200.0000 mg | ORAL_TABLET | Freq: Two times a day (BID) | ORAL | Status: DC | PRN
  Administered 2011-12-28 – 2011-12-29 (×2): 1200 mg via ORAL
  Filled 2011-12-28 (×3): qty 2

## 2011-12-28 MED ORDER — ONDANSETRON HCL 4 MG/2ML IJ SOLN
4.0000 mg | Freq: Once | INTRAMUSCULAR | Status: AC
  Administered 2011-12-28: 4 mg via INTRAVENOUS
  Filled 2011-12-28: qty 2

## 2011-12-28 MED ORDER — ISOSORBIDE MONONITRATE ER 30 MG PO TB24
30.0000 mg | ORAL_TABLET | Freq: Every day | ORAL | Status: DC
  Administered 2011-12-28 – 2011-12-29 (×2): 30 mg via ORAL
  Filled 2011-12-28 (×2): qty 1

## 2011-12-28 MED ORDER — ONDANSETRON HCL 4 MG PO TABS: 4.0000 mg | ORAL_TABLET | Freq: Four times a day (QID) | ORAL | Status: DC | PRN

## 2011-12-28 MED ORDER — AMILORIDE HCL 5 MG PO TABS
10.0000 mg | ORAL_TABLET | Freq: Two times a day (BID) | ORAL | Status: DC
  Administered 2011-12-28 – 2011-12-29 (×2): 10 mg via ORAL
  Filled 2011-12-28 (×3): qty 2

## 2011-12-28 MED ORDER — POTASSIUM CHLORIDE CRYS ER 20 MEQ PO TBCR
60.0000 meq | EXTENDED_RELEASE_TABLET | Freq: Once | ORAL | Status: AC
  Administered 2011-12-28: 60 meq via ORAL
  Filled 2011-12-28: qty 3

## 2011-12-28 MED ORDER — ALPRAZOLAM 0.5 MG PO TABS
0.5000 mg | ORAL_TABLET | Freq: Every evening | ORAL | Status: DC | PRN
  Administered 2011-12-30: 0.5 mg via ORAL
  Filled 2011-12-28: qty 1

## 2011-12-28 MED ORDER — GUAIFENESIN-CODEINE 100-10 MG/5ML PO SOLN
5.0000 mL | Freq: Once | ORAL | Status: AC
  Administered 2011-12-28: 5 mL via ORAL
  Filled 2011-12-28: qty 5

## 2011-12-28 MED ORDER — ACETAMINOPHEN 650 MG RE SUPP: 650.0000 mg | Freq: Four times a day (QID) | RECTAL | Status: DC | PRN

## 2011-12-28 MED ORDER — ACETAMINOPHEN 325 MG PO TABS
650.0000 mg | ORAL_TABLET | Freq: Once | ORAL | Status: AC
  Administered 2011-12-28: 650 mg via ORAL
  Filled 2011-12-28: qty 2

## 2011-12-28 MED ORDER — VARENICLINE TARTRATE 1 MG PO TABS
1.0000 mg | ORAL_TABLET | Freq: Two times a day (BID) | ORAL | Status: DC
  Administered 2011-12-28 – 2012-01-01 (×8): 1 mg via ORAL
  Filled 2011-12-28 (×9): qty 1

## 2011-12-28 MED ORDER — SODIUM CHLORIDE 0.9 % IV SOLN
INTRAVENOUS | Status: DC
  Administered 2011-12-28 – 2011-12-29 (×3): via INTRAVENOUS
  Administered 2011-12-29: 1000 mL via INTRAVENOUS
  Administered 2011-12-29: 125 mL/h via INTRAVENOUS
  Administered 2011-12-30: 10:00:00 via INTRAVENOUS

## 2011-12-28 MED ORDER — ONDANSETRON HCL 4 MG/2ML IJ SOLN: 4.0000 mg | Freq: Four times a day (QID) | INTRAMUSCULAR | Status: DC | PRN

## 2011-12-28 MED ORDER — DEXTROMETHORPHAN POLISTIREX 30 MG/5ML PO LQCR
60.0000 mg | Freq: Two times a day (BID) | ORAL | Status: DC | PRN
  Administered 2011-12-28 – 2011-12-29 (×3): 60 mg via ORAL
  Filled 2011-12-28 (×4): qty 10

## 2011-12-28 MED ORDER — CLONIDINE HCL 0.2 MG PO TABS
0.2000 mg | ORAL_TABLET | Freq: Three times a day (TID) | ORAL | Status: DC
  Administered 2011-12-28 – 2011-12-29 (×3): 0.2 mg via ORAL
  Filled 2011-12-28 (×5): qty 1

## 2011-12-28 MED ORDER — VITAMINS A & D EX OINT
TOPICAL_OINTMENT | CUTANEOUS | Status: AC
  Administered 2011-12-28: 23:00:00
  Filled 2011-12-28: qty 5

## 2011-12-28 MED ORDER — LORATADINE 10 MG PO TABS
10.0000 mg | ORAL_TABLET | Freq: Every day | ORAL | Status: DC
  Administered 2011-12-28 – 2012-01-01 (×5): 10 mg via ORAL
  Filled 2011-12-28 (×5): qty 1

## 2011-12-28 NOTE — ED Notes (Signed)
Patient transported to X-ray 

## 2011-12-28 NOTE — ED Notes (Signed)
Patient reports body aches, headache, productive cough with yellow sputum, vomiting, and chills. Patient reports vomiting x 3 in the past 24 hours. Patient denies diarrhea.

## 2011-12-28 NOTE — ED Notes (Signed)
RN to obtain labs with start of IV 

## 2011-12-28 NOTE — H&P (Signed)
[facility] History and Physical  Robyn [name] [id number] DOB: 07/22/1970 DOA: 12/28/2011  Referring physician: ED physician PCP: [name], MD   Chief Complaint: generalized body aches, nausea and vomiting  HPI:  41 year old female with history of HTN, CKD, anxiety and recent nephrolithotomy who presented with worsening nausea and vomiting started 1 day prior to this admission. Patient reported some abdominal pain, cramplike, diffuse. Patient reported not being able to hold food down. She has had 3 episodes of vomiting prior to arrival to ED. No reports of blood in stool or urine. No fever or chills. She does endorse cough productive of yellow sputum but no shortness of breath. No reports of urinary frequency or urgency or dysuria. In ED patient was found to be hypokalemic with potassium of 2 and remains weak with nausea for which reason [facility] was asked to admit for further evaluation. CXR done in ED showed no active cardiopulmonary process.  Assessment and Plan:  Principal Problem:  *Nausea and vomiting, myalgia  Possibly related to flu, viral illness  We will provide supportive care with IV fluids, NS @ 75 cc/hr  Continue zofran PRN  Follow up influenza PCR  No antibiotics for now  PT evaluation  Active Problems: Cough  Based on CXR no signs of infection  No fever and no elevated WBC count so we will defer antibiotic treatment for now  Continue guaiffenesin  Hypertension  Will hold ACE/ARB's sue to CKD and see if any improvement  Instead we will give hydralazine 25 mg Q 8 hours and Imdur 30 mg daily  Continue coreg 25 mg twice daily and catapres 0.2 mg TID  Continue telemetry monitoring   Check BNP  2 D ECHO done 08/2011 with normal EF Anxiety disorder  Continue xanax  Tobacco user  Smoking cessation provided  Uses Chantix  Hypokalemia  replteed in ED  Continue home potassium supplements  Follow up BMP in am  CKD (chronic  kidney disease), stage III  Would hold off on ARB's and ACEI for now and see if some improvement in CKD Use hydralazine and imdur for BP instead  Continue IV fluids  Nephrolithiasis   history of nephrolithotomy recently 10/2011   Code Status: Full Family Communication: Pt at bedside Disposition Plan: PT evaluation   Mart Piggs [facility] [id number]   Review of Systems:  Constitutional: Negative for fever, chills and positive for malaise/fatigue. Negative for diaphoresis.  HENT: Negative for hearing loss, ear pain, nosebleeds, congestion, sore throat, neck pain, tinnitus and ear discharge.   Eyes: Negative for blurred vision, double vision, photophobia, pain, discharge and redness.  Respiratory: Negative for cough, hemoptysis, sputum production, shortness of breath, wheezing and stridor.   Cardiovascular: Negative for chest pain, palpitations, orthopnea, claudication and leg swelling.  Gastrointestinal: positive  for nausea, vomiting and abdominal pain. Negative for heartburn, constipation, blood in stool and melena.  Genitourinary: Negative for dysuria, urgency, frequency, hematuria and flank pain.  Musculoskeletal: positive for myalgias, no back pain, joint pain and falls.  Skin: Negative for itching and rash.  Neurological: Negative for dizziness and positive for weakness. Negative for tingling, tremors, sensory change, focal weakness, loss of consciousness and headaches.  Endo/Heme/Allergies: Negative for environmental allergies and polydipsia. Does not bruise/bleed easily.  Psychiatric/Behavioral: Negative for suicidal ideas. The patient is not nervous/anxious.      Past Medical History  Diagnosis Date  . Hypertension   . Shortness of breath     ONLY WITH ANXIETY  . Anxiety  OCCAS PANIC ATTACKS  . Blood transfusion 1990'S  . Hypokalemia     PAST HX  . Abnormal Pap smear   . H/O varicella   . Pregnancy induced hypertension   . Depression 2010  . Weight loss 2010  .  Increased BMI   . H/O dysmenorrhea   . Ovarian cyst 2011  . Fibroid 2011  . H/O: menorrhagia 2011  . BV (bacterial vaginosis)   . Perimenopausal symptoms 07/15/2010  . Sleep apnea     PT USES CPAP SOMETIMES - SETTING IS 3  . Kidney stones   . GERD (gastroesophageal reflux disease)     occasional  . Cough     nonproductive cough last 2 weeks    Past Surgical History  Procedure Date  . Percutaneous nephrostomy around 1996 may 2013    right kidney  . Uterine ablation march 2010 march 2011  . C-sections x 2   . Nephrolithotomy 04/09/2011    Procedure: NEPHROLITHOTOMY PERCUTANEOUS;  Surgeon: Molli Hazard, MD;  Location: [facility];  Service: Urology;  Laterality: Right;      . Ureteroscopy 04/09/2011    Procedure: URETEROSCOPY;  Surgeon: Molli Hazard, MD;  Location: [facility];  Service: Urology;  Laterality: Right;  . Cholecystectomy 1993  . Tubal ligation 1999  . Nephrolithotomy 10/15/2011    Procedure: NEPHROLITHOTOMY PERCUTANEOUS;  Surgeon: Molli Hazard, MD;  Location: [facility];  Service: Urology;  Laterality: Left;  . Removal of stones 10/15/2011    Procedure: REMOVAL OF STONES;  Surgeon: Molli Hazard, MD;  Location: [facility];  Service: Urology;  Laterality: Left;    Social History:  reports that she has quit smoking. She has never used smokeless tobacco. She reports that she drinks alcohol. She reports that she does not use illicit drugs.  Allergies  Allergen Reactions  . Sulfa Antibiotics Hives and Rash    Family history: HTN in mother   Prior to Admission medications   Medication Sig Start Date End Date Taking? Authorizing Provider  ALPRAZolam Duanne Moron) 0.5 MG tablet Take 1 tablet (0.5 mg total) by mouth at bedtime as needed for sleep. 07/23/11  Yes [name], MD  aMILoride (MIDAMOR) 5 MG tablet Take 10 mg by mouth 2 (two) times daily.    Yes Historical Provider, MD  carvedilol (COREG) 25 MG tablet Take 25 mg by mouth 2 (two) times daily  with a meal.   Yes Historical Provider, MD  cloNIDine (CATAPRES) 0.2 MG tablet Take 1 tablet (0.2 mg total) by mouth 3 (three) times daily. 08/24/11 08/23/12 Yes Reyne Dumas, MD  dextromethorphan (DELSYM) 30 MG/5ML liquid Take 60 mg by mouth 2 (two) times daily as needed. For cough relief.   Yes Historical Provider, MD  fexofenadine (ALLEGRA) 180 MG tablet Take 180 mg by mouth daily as needed. For allergies   Yes Historical Provider, MD  lisinopril (PRINIVIL,ZESTRIL) 40 MG tablet Take 40 mg by mouth every morning.   Yes Historical Provider, MD  Multiple Vitamins-Minerals (MULTIVITAMIN WITH MINERALS) tablet Take 1 tablet by mouth daily.   Yes Historical Provider, MD  Olmesartan-Amlodipine-HCTZ (TRIBENZOR) 40-10-25 MG TABS Take 1 tablet by mouth daily.   Yes Historical Provider, MD  potassium chloride SA (K-DUR,KLOR-CON) 20 MEQ tablet Take 20 mEq by mouth 3 (three) times daily.   Yes Historical Provider, MD  varenicline (CHANTIX CONTINUING MONTH PAK) 1 MG tablet Take 1 tablet (1 mg total) by mouth 2 (two) times daily. 10/17/11  Yes [name],  MD    Physical Exam: Filed Vitals:   12/28/11 1221  BP: 162/90  Pulse: 95  Temp: 100 F (37.8 C)  TempSrc: Oral  Resp: 16  SpO2: 93%    Physical Exam  Constitutional: Appears well-developed and well-nourished. No distress.  HENT: Normocephalic. No tonsillar erythema or exudates  Eyes: Conjunctivae and EOM are normal. PERRLA, no scleral icterus.  Neck: Normal ROM. Neck supple. No JVD. No tracheal deviation. No thyromegaly.  CVS: RRR, S1/S2 +, no murmurs, no gallops, no carotid bruit.  Pulmonary: Effort and breath sounds normal, no stridor, rhonchi, wheezes, rales.  Abdominal: Soft. BS +,  no distension, diffusely tender, no rebound or guarding.  Musculoskeletal: Normal range of motion. No edema and no tenderness.  Lymphadenopathy: No lymphadenopathy noted, cervical, inguinal. Neuro: Alert. Normal reflexes, muscle tone coordination. No  cranial nerve deficit. Skin: Skin is warm and dry. No rash noted. Not diaphoretic. No erythema. No pallor.  Psychiatric: Normal mood and affect. Behavior, judgment, thought content normal.   Labs on Admission:  Basic Metabolic Panel:  Lab [id number] 1236  NA 139  K 2.0*  CL 92*  CO2 33*  GLUCOSE 150*  BUN 12  CREATININE 1.17*  CALCIUM 9.2  MG 1.4*  PHOS --   CBC:  Lab 12/28/11 1236  WBC 9.1  NEUTROABS --  HGB 13.0  HCT 36.5  MCV 93.4  PLT 246   Radiological Exams on Admission: Dg Chest 2 View  12/28/2011  *RADIOLOGY REPORT*  Clinical Data: Cough  CHEST - 2 VIEW  Comparison: 10/09/2011  Findings: Cardiomediastinal silhouette is stable.  No acute infiltrate or pleural effusion.  No pulmonary edema.  Stable mild degenerative changes thoracic spine.  IMPRESSION: No active disease.  No significant change.   Original Report Authenticated By: Lahoma Crocker, M.D.     EKG: Normal sinus rhythm, no ST/T wave changes  Faye Ramsay, MD  [facility] Pager [phone]  If 7PM-7AM, please contact night-coverage [email or web address] Password [facility] 12/28/2011, 2:23 PM

## 2011-12-28 NOTE — Progress Notes (Signed)
CRITICAL VALUE ALERT  Critical value received: KCL 2.3   Date of notification:  12/28/11  Time of notification:  [id number]  Critical value read back:yes  Nurse who received alert:  broth  MD notified (1st page):  myers  Time of first page:  1748  MD notified (2nd page):  Time of second page:  Responding MD: Doyle Askew   Time MD responded:  [phone]

## 2011-12-29 ENCOUNTER — Inpatient Hospital Stay (HOSPITAL_COMMUNITY): Payer: 59

## 2011-12-29 LAB — COMPREHENSIVE METABOLIC PANEL
ALT: 30 U/L (ref 0–35)
AST: 33 U/L (ref 0–37)
CO2: 32 mEq/L (ref 19–32)
Chloride: 96 mEq/L (ref 96–112)
Creatinine, Ser: 2 mg/dL — ABNORMAL HIGH (ref 0.50–1.10)
GFR calc Af Amer: 35 mL/min — ABNORMAL LOW (ref >90)
GFR calc non Af Amer: 30 mL/min — ABNORMAL LOW (ref >90)
Glucose, Bld: 122 mg/dL — ABNORMAL HIGH (ref 70–99)
Sodium: 139 mEq/L (ref 135–145)
Total Bilirubin: 0.7 mg/dL (ref 0.3–1.2)

## 2011-12-29 LAB — HEMOGLOBIN A1C: Mean Plasma Glucose: 114 mg/dL (ref <117)

## 2011-12-29 LAB — BASIC METABOLIC PANEL
BUN: 22 mg/dL (ref 6–23)
Calcium: 7.8 mg/dL — ABNORMAL LOW (ref 8.4–10.5)
Creatinine, Ser: 1.91 mg/dL — ABNORMAL HIGH (ref 0.50–1.10)
GFR calc Af Amer: 37 mL/min — ABNORMAL LOW (ref >90)
GFR calc non Af Amer: 32 mL/min — ABNORMAL LOW (ref >90)

## 2011-12-29 LAB — CBC
MCH: 31.9 pg (ref 26.0–34.0)
MCHC: 33.2 g/dL (ref 30.0–36.0)
Platelets: 193 10*3/uL (ref 150–400)
RDW: 13.9 % (ref 11.5–15.5)

## 2011-12-29 LAB — T3, FREE: T3, Free: 2.3 pg/mL (ref 2.3–4.2)

## 2011-12-29 LAB — POTASSIUM: Potassium: 2.7 mEq/L — CL (ref 3.5–5.1)

## 2011-12-29 LAB — MAGNESIUM: Magnesium: 1.8 mg/dL (ref 1.5–2.5)

## 2011-12-29 LAB — T4, FREE: Free T4: 1.18 ng/dL (ref 0.80–1.80)

## 2011-12-29 LAB — INFLUENZA PANEL BY PCR (TYPE A & B): Influenza A By PCR: POSITIVE — AB

## 2011-12-29 MED ORDER — POTASSIUM CHLORIDE 10 MEQ/100ML IV SOLN
10.0000 meq | INTRAVENOUS | Status: AC
  Administered 2011-12-29 (×6): 10 meq via INTRAVENOUS
  Filled 2011-12-29 (×6): qty 100

## 2011-12-29 MED ORDER — POTASSIUM CHLORIDE 10 MEQ/100ML IV SOLN
10.0000 meq | INTRAVENOUS | Status: AC
  Administered 2011-12-29 (×4): 10 meq via INTRAVENOUS
  Filled 2011-12-29 (×4): qty 100

## 2011-12-29 MED ORDER — OSELTAMIVIR PHOSPHATE 75 MG PO CAPS
75.0000 mg | ORAL_CAPSULE | Freq: Two times a day (BID) | ORAL | Status: DC
  Administered 2011-12-29 – 2012-01-01 (×7): 75 mg via ORAL
  Filled 2011-12-29 (×8): qty 1

## 2011-12-29 NOTE — Evaluation (Signed)
Physical Therapy Evaluation Patient Details Name: Robyn [name] MRN: [id number] DOB: 07-20-1970 Today's Date: 12/29/2011 Time: [id number] PT Time Calculation (min): 10 min  PT Assessment / Plan / Recommendation Clinical Impression  Pt presents with the flu and history of HTN, anxiety and CKD.  Tolerated OOB and ambulation in hallway well using IV pole for minimal support.  Noted no instability or LOB.  Pt will not require any PT while in hospital or follow up at home at D/C.  Encouraged pt to ambulate in hallway with nursing prn.  PT to sign off on pt at this time.     PT Assessment  Patent does not need any further PT services    Follow Up Recommendations       Does the patient have the potential to tolerate intense rehabilitation      Barriers to Discharge        Equipment Recommendations       Recommendations for Other Services     Frequency      Precautions / Restrictions Precautions Precautions: None Restrictions Weight Bearing Restrictions: No   Pertinent Vitals/Pain Some mild pain in low back, she states this is chronic.       Mobility  Bed Mobility Bed Mobility: Supine to Sit Supine to Sit: 7: Independent Transfers Transfers: Sit to Stand;Stand to Sit Sit to Stand: 7: Independent Stand to Sit: 7: Independent Ambulation/Gait Ambulation/Gait Assistance: 6: Modified independent (Device/Increase time) Ambulation Distance (Feet): 200 Feet Assistive device: Other (Comment) (used IV pole) Gait Pattern: Step-through pattern Stairs: No Wheelchair Mobility Wheelchair Mobility: No    Shoulder Instructions     Exercises     PT Diagnosis:    PT Problem List:   PT Treatment Interventions:     PT Goals    Visit Information  Last PT Received On: 12/29/11 Assistance Needed: +1    Subjective Data  Subjective: I'll go for a walk.  Patient Stated Goal: to feel better by Christmas   Prior Functioning  Home Living Lives With: Spouse;Family Available  Help at Discharge: Family;Available PRN/intermittently Type of Home: House Home Adaptive Equipment: None Prior Function Level of Independence: Independent Able to Take Stairs?: Yes Driving: Yes Vocation: Full time employment Communication Communication: No difficulties    Cognition  Overall Cognitive Status: Appears within functional limits for tasks assessed/performed Arousal/Alertness: Awake/alert Orientation Level: Appears intact for tasks assessed Behavior During Session: [facility] for tasks performed    Extremity/Trunk Assessment Right Lower Extremity Assessment RLE ROM/Strength/Tone: WFL for tasks assessed RLE Sensation: WFL - Light Touch Left Lower Extremity Assessment LLE ROM/Strength/Tone: WFL for tasks assessed LLE Sensation: WFL - Light Touch   Balance    End of Session PT - End of Session Activity Tolerance: Patient tolerated treatment well Patient left: in chair;with call bell/phone within reach Nurse Communication: Mobility status  GP     Page, Betha Loa 12/29/2011, 12:11 PM

## 2011-12-29 NOTE — Progress Notes (Signed)
[facility] PROGRESS NOTE  Robyn [name] [id number] DOB: [birth year] DOA: 12/28/2011 PCP: Robyn Lennox, MD  Assessment/Plan:   Influenza: Start Tamiflu.  Nausea and vomiting, myalgia  Possibly related to flu, viral illness  We will provide supportive care with IV fluids, NS @ 75 cc/hr  Continue zofran PRN  Hypertension  Will hold ACE/ARB's sue to CKD and see if any improvement  Hold BP medications , SBP soft. Anxiety disorder  Continue xanax Tobacco user  Smoking cessation provided  Uses Chantix Hypokalemia  IV KCl 6 runs , repeat bmet this afternoon. Continue home potassium supplements  Follow up BMP in am CKD (chronic kidney disease), stage III   hold off on ARB's and ACEI for now and see if some improvement in CKD Continue IV fluids Worsening renal function, Increase IV fluids. Avoid hypotension. Nephrolithiasis  history of nephrolithotomy recently 10/2011 Check [place] due to worsening renal function.  Consultants:  None  Procedures:  None  Antibiotics:  none  HPI/Subjective: Feeling better. Nausea better.  Objective: Filed Vitals:   12/29/11 0826 12/29/11 0941 12/29/11 1329 12/29/11 1745  BP: 127/69 110/58 107/50 121/90  Pulse:  76 73   Temp:   98.7 F (37.1 C)   TempSrc:   Oral   Resp:   20   Height:      Weight:      SpO2:   94%     Intake/Output Summary (Last 24 hours) at 12/29/11 1920 Last data filed at 12/29/11 1830  Gross per 24 hour  Intake 3357.08 ml  Output   1000 ml  Net 2357.08 ml   Filed Weights   12/28/11 1600 12/29/11 0718  Weight: 123.968 kg (273 lb 4.8 oz) 126.644 kg (279 lb 3.2 oz)    Exam:   General: no distress.  Cardiovascular: S 1 s2 RRR  Respiratory: CTA  Abdomen: Bs present, soft, nt.  Data Reviewed: Basic Metabolic Panel:  Lab 0000000 1250 12/29/11 0422 12/28/11 1650 12/28/11 1236  NA 133* 139 140 139  K 2.7* 2.4* 2.3* 2.0*  CL 96 96 94* 92*  CO2 30 32 34* 33*  GLUCOSE 131* 122*  104* 150*  BUN 22 19 13 12   CREATININE 1.91* 2.00* 1.28* 1.17*  CALCIUM 7.8* 7.9* 8.4 9.2  MG -- 1.8 1.9 1.4*  PHOS -- -- 3.5 --   Liver Function Tests:  Lab 12/29/11 0422 12/28/11 1650  AST 33 27  ALT 30 27  ALKPHOS 66 76  BILITOT 0.7 1.0  PROT 6.3 7.0  ALBUMIN 3.1* 3.3*   No results found for this basename: LIPASE:5,AMYLASE:5 in the last 168 hours No results found for this basename: AMMONIA:5 in the last 168 hours CBC:  Lab 12/29/11 0422 12/28/11 1650 12/28/11 1236  WBC 6.1 7.8 9.1  NEUTROABS -- 6.3 --  HGB 10.7* 12.0 13.0  HCT 32.2* 34.0* 36.5  MCV 96.1 94.2 93.4  PLT 193 217 246   Cardiac Enzymes: No results found for this basename: CKTOTAL:5,CKMB:5,CKMBINDEX:5,TROPONINI:5 in the last 168 hours BNP (last 3 results)  Basename 12/28/11 1649  PROBNP 331.4*   CBG:  Lab 12/29/11 0744  GLUCAP 123*    No results found for this or any previous visit (from the past 240 hour(s)).   Studies: Dg Chest 2 View  12/28/2011  *RADIOLOGY REPORT*  Clinical Data: Cough  CHEST - 2 VIEW  Comparison: 10/09/2011  Findings: Cardiomediastinal silhouette is stable.  No acute infiltrate or pleural effusion.  No pulmonary edema.  Stable mild degenerative changes  thoracic spine.  IMPRESSION: No active disease.  No significant change.   Original Report Authenticated By: Lahoma Crocker, M.D.    US Renal Port  12/29/2011  *RADIOLOGY REPORT*  Clinical Data: Renal failure.  Previous nephrolithotomy.  RENAL/URINARY TRACT ULTRASOUND COMPLETE  Comparison:  10/15/2011 and earlier studies  Findings:  Right Kidney:  12 x 1 cm. No hydronephrosis.  Well-preserved cortex.  Normal size and parenchymal echotexture without focal abnormalities.  Left Kidney:  12 cm.  Mild hydronephrosis.  Echogenic focus in the periphery of the renal collecting system in the upper poles suggests calculus.  Bladder:  Decompressed.  IMPRESSION:  1.  Mild left hydronephrosis, possible left nephrolithiasis.   Original Report  Authenticated By: D. Wallace Going, MD     Scheduled Meds:   . carvedilol  25 mg Oral BID WC  . loratadine  10 mg Oral Daily  . multivitamin with minerals  1 tablet Oral Daily  . oseltamivir  75 mg Oral BID  . potassium chloride SA  20 mEq Oral TID  . varenicline  1 mg Oral BID   Continuous Infusions:   . sodium chloride 125 mL/hr (12/29/11 1602)    Principal Problem:  *Nausea and vomiting Active Problems:  Anxiety disorder  Tobacco user  Hypokalemia  CKD (chronic kidney disease), stage III  Nephrolithiasis  Hypertension    Time spent: 25 minutes    Robyn [name]  [facility] Pager [phone]. If 8PM-8AM, please contact night-coverage at [email or web address], password [facility] 12/29/2011, 7:20 PM  LOS: 1 day

## 2011-12-29 NOTE — Progress Notes (Signed)
CRITICAL VALUE ALERT  Critical value received:  KCL 2.4   Date of notification:  12/29/11  Time of notification:  [id number]  Critical value read back:yes   Nurse who received alert:  Faythe Casa   MD notified (1st page): Gasper Lloyd   Time of first page:  0550  MD notified (2nd page):  Time of second page:  Responding MD:  Gasper Lloyd  Time MD responded:  [phone]

## 2011-12-29 NOTE — Progress Notes (Signed)
CRITICAL VALUE ALERT  Critical value received:  K 2.7  Date of notification:  12/23  Time of notification:  1330  Critical value read back:yes  Nurse who received alert:  KPhillips RN  MD notified (1st page):  Regalado  Time of first page:  1330  MD notified (2nd page):  Time of second page:  Responding MD: Tyrell Antonio  Time MD responded:  [id number]

## 2011-12-30 LAB — BASIC METABOLIC PANEL
GFR calc Af Amer: 54 mL/min — ABNORMAL LOW (ref >90)
GFR calc non Af Amer: 46 mL/min — ABNORMAL LOW (ref >90)
Glucose, Bld: 100 mg/dL — ABNORMAL HIGH (ref 70–99)
Potassium: 3.3 mEq/L — ABNORMAL LOW (ref 3.5–5.1)
Sodium: 137 mEq/L (ref 135–145)

## 2011-12-30 LAB — GLUCOSE, CAPILLARY: Glucose-Capillary: 108 mg/dL — ABNORMAL HIGH (ref 70–99)

## 2011-12-30 LAB — POTASSIUM: Potassium: 3.2 mEq/L — ABNORMAL LOW (ref 3.5–5.1)

## 2011-12-30 LAB — URINE CULTURE: Colony Count: 25000

## 2011-12-30 MED ORDER — POTASSIUM CHLORIDE CRYS ER 20 MEQ PO TBCR
30.0000 meq | EXTENDED_RELEASE_TABLET | Freq: Once | ORAL | Status: AC
  Administered 2011-12-30: 30 meq via ORAL
  Filled 2011-12-30: qty 1

## 2011-12-30 MED ORDER — POTASSIUM CHLORIDE 20 MEQ/15ML (10%) PO LIQD
40.0000 meq | Freq: Every day | ORAL | Status: DC
  Administered 2011-12-30 (×2): 40 meq via ORAL
  Filled 2011-12-30 (×2): qty 30

## 2011-12-30 MED ORDER — POTASSIUM CHLORIDE CRYS ER 20 MEQ PO TBCR
20.0000 meq | EXTENDED_RELEASE_TABLET | Freq: Once | ORAL | Status: AC
  Administered 2011-12-30: 20 meq via ORAL
  Filled 2011-12-30: qty 1

## 2011-12-30 NOTE — Progress Notes (Signed)
[facility] PROGRESS NOTE  BETZABET IMBRIANO [id number] DOB: 1970/11/06 DOA: 12/28/2011 PCP: [name], MD  Assessment/Plan: Influenza: Continue with Tamiflu.  Nausea and vomiting, myalgia  Possibly related to flu, viral illness  We will provide supportive care with IV fluids, NS @ 75 cc/hr  Continue zofran PRN. Improved. Hypertension  Will hold ACE/ARB's sue to CKD and see if any improvement  Hold BP medications , SBP fluctuates. Anxiety disorder  Continue xanax Tobacco user  Smoking cessation provided  Uses Chantix Hypokalemia  K-CL 20 meq TID. Extra dose of 40 meq.  Follow up BMP in am CKD (chronic kidney disease), stage III  hold off on ARB's and ACEI for now and see if some improvement in CKD  Continue IV fluids  Improving, Cr decrease to 1.4. Avoid hypotension. Nephrolithiasis  history of nephrolithotomy recently 10/2011              [place] : Mild left hydronephrosis, possible left nephrolithiasis.              CT : 1. No definite acute obstructive uropathy and interval decreased                       bilateral nephrolithiasis.                       2. There may be a punctate stone just inside the bladder near the                        left UVJ (see sagittal image 108) 3. New right renal mid pole scarring with dystrophic                        Calcification.                Urologist on call Dr Gaynelle Arabian reviewed CT scan. Recommend follow up with primary urologist, no intervention at this time. He                                recommend VCG test.   Disposition: Discharge 12-25.  Consultants:  None Procedures:  None Antibiotics:  none   HPI/Subjective: Feeling better. Tolerating meals.  Generalized muscle pain better.   Objective: Filed Vitals:   12/29/11 1329 12/29/11 1745 12/29/11 2200 12/30/11 0632  BP: 107/50 121/90 113/63 146/91  Pulse: 73  65 71  Temp: 98.7 F (37.1 C)  98.2 F (36.8 C) 98.1 F (36.7 C)  TempSrc: Oral  Oral Oral   Resp: 20  22 20   Height:      Weight:    129.956 kg (286 lb 8 oz)  SpO2: 94%  97% 94%    Intake/Output Summary (Last 24 hours) at 12/30/11 1318 Last data filed at 12/30/11 0900  Gross per 24 hour  Intake 3791.25 ml  Output   2225 ml  Net 1566.25 ml   Filed Weights   12/28/11 1600 12/29/11 0718 12/30/11 0632  Weight: 123.968 kg (273 lb 4.8 oz) 126.644 kg (279 lb 3.2 oz) 129.956 kg (286 lb 8 oz)    Exam:   General:  No distress.  Cardiovascular: S 1, S 2 RRR  Respiratory: CTA  Abdomen: Bs present, soft, nt.  Data Reviewed: Basic Metabolic Panel:  Lab [id number] 12/29/11 2316 12/29/11 1250 12/29/11 0422 12/28/11 1650 12/28/11 1236  NA  137 -- 133* 139 140 139  K 3.3* 2.7* 2.7* 2.4* 2.3* --  CL 101 -- 96 96 94* 92*  CO2 29 -- 30 32 34* 33*  GLUCOSE 100* -- 131* 122* 104* 150*  BUN 20 -- 22 19 13 12   CREATININE 1.40* -- 1.91* 2.00* 1.28* 1.17*  CALCIUM 8.1* -- 7.8* 7.9* 8.4 9.2  MG -- -- -- 1.8 1.9 1.4*  PHOS -- -- -- -- 3.5 --   Liver Function Tests:  Lab 12/29/11 0422 12/28/11 1650  AST 33 27  ALT 30 27  ALKPHOS 66 76  BILITOT 0.7 1.0  PROT 6.3 7.0  ALBUMIN 3.1* 3.3*   CBC:  Lab 12/29/11 0422 12/28/11 1650 12/28/11 1236  WBC 6.1 7.8 9.1  NEUTROABS -- 6.3 --  HGB 10.7* 12.0 13.0  HCT 32.2* 34.0* 36.5  MCV 96.1 94.2 93.4  PLT 193 217 246   Cardiac Enzymes: No results found for this basename: CKTOTAL:5,CKMB:5,CKMBINDEX:5,TROPONINI:5 in the last 168 hours BNP (last 3 results)  Basename 12/28/11 1649  PROBNP 331.4*   CBG:  Lab 12/29/11 0744  GLUCAP 123*    Recent Results (from the past 240 hour(s))  URINE CULTURE     Status: Normal   Collection Time   12/28/11  1:49 PM      Component Value Range Status Comment   Specimen Description URINE, CLEAN CATCH   Final    Special Requests NONE   Final    Culture  Setup Time 12/28/2011 19:51   Final    Colony Count 25,000 COLONIES/ML   Final    Culture     Final    Value: Multiple bacterial  morphotypes present, none predominant. Suggest appropriate recollection if clinically indicated.   Report Status 12/30/2011 FINAL   Final      Studies: Ct Abdomen Pelvis Wo Contrast  12/30/2011  *RADIOLOGY REPORT*  Clinical Data: 41 year old female with abdominal pain.  History of nephrolithiasis.  Nausea and vomiting.  CT ABDOMEN AND PELVIS WITHOUT CONTRAST  Technique:  Multidetector CT imaging of the abdomen and pelvis was performed following the standard protocol without intravenous contrast.  Comparison: 01/30/2011.  Findings: Increased linear opacity in the lower lobes most compatible with atelectasis.  Cardiomegaly.  No pericardial or pleural effusion.  No acute osseous abnormality identified.  No pelvic free fluid.  Stable and negative noncontrast uterus and adnexa.  Redundant distal colon, the sigmoid extends into the right lower quadrant.  Retained stool in the distal colon right upper quadrant surgical clip adjacent to the ascending colon may be related to the cholecystectomy.  No inflammation of the colon. Normal appendix.  No dilated small bowel.  Small fat containing umbilical hernia is unchanged.  Gas in the distal esophagus. Stomach mildly distended with food debris.  Gallbladder surgically absent.  Negative noncontrast liver, spleen, pancreas and adrenal glands.  Left nephrolithiasis has decreased.  There are punctate intrarenal calculi in the mid pole.  Left parapelvic cysts are suspected.  No left perinephric stranding, definite hydronephrosis, left hydroureter, or left periureteral stranding.  Course of the left ureter is within normal limits.  Phleboliths in the left hemi pelvis are stable.  Right nephrolithiasis is decreased.  There is calcified right renal cortical atrophy now at the lower pole (series 2 image 43).  No definite right intrarenal calculus.  No right hydronephrosis, perinephric stranding, hydroureter, periureteral stranding, or calculus identified along the course of the  right ureter.  A small right hemi pelvis phlebolith are stable.  The bladder is decompressed.  There may be a tiny calculus just inside the bladder near the left UVJ (series 2 image 72, sagittal image 108). No abdominal free fluid.  IMPRESSION: 1.  No definite acute obstructive uropathy and interval decreased bilateral nephrolithiasis. 2.  There may be a punctate stone just inside the bladder near the left UVJ (see sagittal image 108). 3.  New right renal mid pole scarring with dystrophic calcification. 4.  No other acute findings in the abdomen or pelvis:  Negative appendix.  Previous cholecystectomy.  Cardiomegaly.  Pulmonary atelectasis.   Original Report Authenticated By: Roselyn Reef, M.D.    US Renal Port  12/29/2011  *RADIOLOGY REPORT*  Clinical Data: Renal failure.  Previous nephrolithotomy.  RENAL/URINARY TRACT ULTRASOUND COMPLETE  Comparison:  10/15/2011 and earlier studies  Findings:  Right Kidney:  12 x 1 cm. No hydronephrosis.  Well-preserved cortex.  Normal size and parenchymal echotexture without focal abnormalities.  Left Kidney:  12 cm.  Mild hydronephrosis.  Echogenic focus in the periphery of the renal collecting system in the upper poles suggests calculus.  Bladder:  Decompressed.  IMPRESSION:  1.  Mild left hydronephrosis, possible left nephrolithiasis.   Original Report Authenticated By: D. Wallace Going, MD     Scheduled Meds:   . carvedilol  25 mg Oral BID WC  . loratadine  10 mg Oral Daily  . multivitamin with minerals  1 tablet Oral Daily  . oseltamivir  75 mg Oral BID  . potassium chloride  40 mEq Oral Daily  . potassium chloride SA  20 mEq Oral TID  . varenicline  1 mg Oral BID   Continuous Infusions:   . sodium chloride 100 mL/hr at 12/30/11 1302    Principal Problem:  *Nausea and vomiting Active Problems:  Anxiety disorder  Tobacco user  Hypokalemia  CKD (chronic kidney disease), stage III  Nephrolithiasis  Hypertension    Time spent: 35  minutes.    Robyn [name]  [facility] Pager [phone]. If 8PM-8AM, please contact night-coverage at [email or web address], password [facility] 12/30/2011, 1:18 PM  LOS: 2 days

## 2011-12-30 NOTE — Progress Notes (Deleted)
Pt K+ 3.2. Result reported to M.D. On call. Order received to give kdur 40 meq  Once.  Dose scheduled for 2200. Pt stable.Will continue to monitor.

## 2011-12-30 NOTE — Progress Notes (Signed)
RN was notified by the lab of Potassium result of 2.7 MD on call was notified of Potassium result.

## 2011-12-30 NOTE — Progress Notes (Signed)
The Potassium result was 3.3.  The MD was notified of this result. Awaiting any new orders.

## 2011-12-30 NOTE — Progress Notes (Signed)
Pt K+ 3.2. Result reported to M.D. On call. Order received to give pt total dose 40 meq Kdur once. Pt has regular scheduled dose kdur 20 meq at 2200. Will enter order for total dose.

## 2011-12-31 LAB — CBC
MCH: 32.4 pg (ref 26.0–34.0)
MCHC: 34.5 g/dL (ref 30.0–36.0)
MCV: 93.9 fL (ref 78.0–100.0)
Platelets: 241 10*3/uL (ref 150–400)
RDW: 13.2 % (ref 11.5–15.5)
WBC: 4.9 10*3/uL (ref 4.0–10.5)

## 2011-12-31 LAB — BASIC METABOLIC PANEL
BUN: 10 mg/dL (ref 6–23)
Calcium: 8.1 mg/dL — ABNORMAL LOW (ref 8.4–10.5)
Creatinine, Ser: 0.9 mg/dL (ref 0.50–1.10)
GFR calc Af Amer: >90 mL/min (ref >90)

## 2011-12-31 LAB — GLUCOSE, CAPILLARY: Glucose-Capillary: 168 mg/dL — ABNORMAL HIGH (ref 70–99)

## 2011-12-31 MED ORDER — POTASSIUM CHLORIDE CRYS ER 20 MEQ PO TBCR
40.0000 meq | EXTENDED_RELEASE_TABLET | Freq: Three times a day (TID) | ORAL | Status: DC
  Administered 2011-12-31 – 2012-01-01 (×3): 40 meq via ORAL
  Filled 2011-12-31 (×6): qty 2

## 2011-12-31 MED ORDER — POTASSIUM CHLORIDE 10 MEQ/100ML IV SOLN
10.0000 meq | INTRAVENOUS | Status: AC
  Administered 2011-12-31 (×3): 10 meq via INTRAVENOUS
  Filled 2011-12-31 (×3): qty 100

## 2011-12-31 MED ORDER — CLONIDINE HCL 0.2 MG PO TABS
0.2000 mg | ORAL_TABLET | Freq: Three times a day (TID) | ORAL | Status: DC
  Administered 2011-12-31 – 2012-01-01 (×5): 0.2 mg via ORAL
  Filled 2011-12-31 (×6): qty 1

## 2011-12-31 MED ORDER — POTASSIUM CHLORIDE CRYS ER 20 MEQ PO TBCR
40.0000 meq | EXTENDED_RELEASE_TABLET | Freq: Once | ORAL | Status: AC
  Administered 2011-12-31: 40 meq via ORAL
  Filled 2011-12-31: qty 2

## 2011-12-31 MED ORDER — HYDRALAZINE HCL 20 MG/ML IJ SOLN
10.0000 mg | Freq: Once | INTRAMUSCULAR | Status: AC
  Administered 2011-12-31: 10 mg via INTRAVENOUS
  Filled 2011-12-31: qty 1

## 2011-12-31 NOTE — ED Provider Notes (Signed)
History    41 year old female with generalized malaise and body aches. Onset about 2 days ago and progressively worsening. Subjective fever. 2 episodes of vomiting over the past day. Coffee, but no shortness of breath. Mild sore throat. No difficulty swallowing. No diarrhea. No urinary complaints.  CSN: [id number]  Arrival date & time 12/28/11  1202   First MD Initiated Contact with Patient 12/28/11 1229      Chief Complaint  Patient presents with  . Headache  . Cough  . Emesis    (Consider location/radiation/quality/duration/timing/severity/associated sxs/prior treatment) HPI  Past Medical History  Diagnosis Date  . Hypertension   . Shortness of breath     ONLY WITH ANXIETY  . Anxiety     OCCAS PANIC ATTACKS  . Blood transfusion 1990'S  . Hypokalemia     PAST HX  . Abnormal Pap smear   . H/O varicella   . Pregnancy induced hypertension   . Depression 2010  . Weight loss 2010  . Increased BMI   . H/O dysmenorrhea   . Ovarian cyst 2011  . Fibroid 2011  . H/O: menorrhagia 2011  . BV (bacterial vaginosis)   . Perimenopausal symptoms 07/15/2010  . Sleep apnea     PT USES CPAP SOMETIMES - SETTING IS 3  . Kidney stones   . GERD (gastroesophageal reflux disease)     occasional  . Cough     nonproductive cough last 2 weeks    Past Surgical History  Procedure Date  . Percutaneous nephrostomy around 1996 may 2013    right kidney  . Uterine ablation march 2010 march 2011  . C-sections x 2   . Nephrolithotomy 04/09/2011    Procedure: NEPHROLITHOTOMY PERCUTANEOUS;  Surgeon: Molli Hazard, MD;  Location: [facility];  Service: Urology;  Laterality: Right;      . Ureteroscopy 04/09/2011    Procedure: URETEROSCOPY;  Surgeon: Molli Hazard, MD;  Location: [facility];  Service: Urology;  Laterality: Right;  . Cholecystectomy 1993  . Tubal ligation 1999  . Nephrolithotomy 10/15/2011    Procedure: NEPHROLITHOTOMY PERCUTANEOUS;  Surgeon: Molli Hazard, MD;   Location: [facility];  Service: Urology;  Laterality: Left;  . Removal of stones 10/15/2011    Procedure: REMOVAL OF STONES;  Surgeon: Molli Hazard, MD;  Location: [facility];  Service: Urology;  Laterality: Left;    History reviewed. No pertinent family history.  History  Substance Use Topics  . Smoking status: Former Smoker -- 0.2 packs/day for 15 years  . Smokeless tobacco: Never Used  . Alcohol Use: Yes     Comment: OCCAS    OB History    Grav Para Term Preterm Abortions TAB SAB Ect Mult Living   2 2 2       2       Review of Systems  All systems reviewed and negative, other than as noted in HPI.   Allergies  Sulfa antibiotics  Home Medications  No current outpatient prescriptions on file.  BP 149/100  Pulse 77  Temp 98.5 F (36.9 C) (Oral)  Resp 20  Ht 5\' 6"  (1.676 m)  Wt 277 lb 1.6 oz (125.692 kg)  BMI 44.73 kg/m2  SpO2 99%  Physical Exam  Nursing note and vitals reviewed. Constitutional: She appears well-developed and well-nourished. No distress.       Laying in bed. Tired appearing, but not toxic. Obese.  HENT:  Head: Normocephalic and atraumatic.  Eyes: Conjunctivae normal are normal. Right eye exhibits no  discharge. Left eye exhibits no discharge.  Neck: Neck supple.  Cardiovascular: Normal rate, regular rhythm and normal heart sounds.  Exam reveals no gallop and no friction rub.   No murmur heard. Pulmonary/Chest: Effort normal and breath sounds normal. No respiratory distress.  Abdominal: Soft. She exhibits no distension. There is no tenderness.  Musculoskeletal: She exhibits no edema and no tenderness.  Neurological: She is alert.  Skin: Skin is warm and dry.  Psychiatric: She has a normal mood and affect. Her behavior is normal. Thought content normal.    ED Course  Procedures (including critical care time)  Labs Reviewed  BASIC METABOLIC PANEL - Abnormal; Notable for the following:    Potassium 2.0 (*)     Chloride 92 (*)     CO2 33 (*)      Glucose, Bld 150 (*)     Creatinine, Ser 1.17 (*)     GFR calc non Af Amer 57 (*)     GFR calc Af Amer 67 (*)     All other components within normal limits  URINALYSIS, ROUTINE W REFLEX MICROSCOPIC - Abnormal; Notable for the following:    Protein, ur >300 (*)     All other components within normal limits  MAGNESIUM - Abnormal; Notable for the following:    Magnesium 1.4 (*)     All other components within normal limits  URINE MICROSCOPIC-ADD ON - Abnormal; Notable for the following:    Bacteria, UA FEW (*)     Casts HYALINE CASTS (*)     All other components within normal limits  INFLUENZA PANEL BY PCR - Abnormal; Notable for the following:    Influenza A By PCR POSITIVE (*)     H1N1 flu by pcr DETECTED (*)     All other components within normal limits  COMPREHENSIVE METABOLIC PANEL - Abnormal; Notable for the following:    Potassium 2.3 (*)     Chloride 94 (*)     CO2 34 (*)     Glucose, Bld 104 (*)     Creatinine, Ser 1.28 (*)     Albumin 3.3 (*)     GFR calc non Af Amer 52 (*)     GFR calc Af Amer 60 (*)     All other components within normal limits  CBC WITH DIFFERENTIAL - Abnormal; Notable for the following:    RBC 3.61 (*)     HCT 34.0 (*)     Neutrophils Relative 81 (*)     Lymphocytes Relative 11 (*)     All other components within normal limits  APTT - Abnormal; Notable for the following:    aPTT 41 (*)     All other components within normal limits  TSH - Abnormal; Notable for the following:    TSH 0.339 (*)     All other components within normal limits  PRO B NATRIURETIC PEPTIDE - Abnormal; Notable for the following:    Pro B Natriuretic peptide (BNP) 331.4 (*)     All other components within normal limits  COMPREHENSIVE METABOLIC PANEL - Abnormal; Notable for the following:    Potassium 2.4 (*)     Glucose, Bld 122 (*)     Creatinine, Ser 2.00 (*)     Calcium 7.9 (*)     Albumin 3.1 (*)     GFR calc non Af Amer 30 (*)     GFR calc Af Amer 35 (*)      All other components within  normal limits  CBC - Abnormal; Notable for the following:    RBC 3.35 (*)     Hemoglobin 10.7 (*)     HCT 32.2 (*)     All other components within normal limits  GLUCOSE, CAPILLARY - Abnormal; Notable for the following:    Glucose-Capillary 123 (*)     All other components within normal limits  BASIC METABOLIC PANEL - Abnormal; Notable for the following:    Sodium 133 (*)     Potassium 2.7 (*)     Glucose, Bld 131 (*)     Creatinine, Ser 1.91 (*)     Calcium 7.8 (*)     GFR calc non Af Amer 32 (*)     GFR calc Af Amer 37 (*)     All other components within normal limits  BASIC METABOLIC PANEL - Abnormal; Notable for the following:    Potassium 3.3 (*)     Glucose, Bld 100 (*)     Creatinine, Ser 1.40 (*)     Calcium 8.1 (*)     GFR calc non Af Amer 46 (*)     GFR calc Af Amer 54 (*)     All other components within normal limits  POTASSIUM - Abnormal; Notable for the following:    Potassium 2.7 (*)     All other components within normal limits  POTASSIUM - Abnormal; Notable for the following:    Potassium 3.2 (*)     All other components within normal limits  GLUCOSE, CAPILLARY - Abnormal; Notable for the following:    Glucose-Capillary 108 (*)     All other components within normal limits  CBC - Abnormal; Notable for the following:    RBC 3.80 (*)     HCT 35.7 (*)     All other components within normal limits  BASIC METABOLIC PANEL - Abnormal; Notable for the following:    Potassium 2.8 (*)     Glucose, Bld 107 (*)     Calcium 8.1 (*)     GFR calc non Af Amer 79 (*)     All other components within normal limits  GLUCOSE, CAPILLARY - Abnormal; Notable for the following:    Glucose-Capillary 168 (*)     All other components within normal limits  CBC  URINE CULTURE  MAGNESIUM  PHOSPHORUS  PROTIME-INR  HEMOGLOBIN A1C  MAGNESIUM  T3, FREE  T4, FREE  POTASSIUM   Ct Abdomen Pelvis Wo Contrast  12/30/2011  *RADIOLOGY REPORT*   Clinical Data: 41 year old female with abdominal pain.  History of nephrolithiasis.  Nausea and vomiting.  CT ABDOMEN AND PELVIS WITHOUT CONTRAST  Technique:  Multidetector CT imaging of the abdomen and pelvis was performed following the standard protocol without intravenous contrast.  Comparison: 01/30/2011.  Findings: Increased linear opacity in the lower lobes most compatible with atelectasis.  Cardiomegaly.  No pericardial or pleural effusion.  No acute osseous abnormality identified.  No pelvic free fluid.  Stable and negative noncontrast uterus and adnexa.  Redundant distal colon, the sigmoid extends into the right lower quadrant.  Retained stool in the distal colon right upper quadrant surgical clip adjacent to the ascending colon may be related to the cholecystectomy.  No inflammation of the colon. Normal appendix.  No dilated small bowel.  Small fat containing umbilical hernia is unchanged.  Gas in the distal esophagus. Stomach mildly distended with food debris.  Gallbladder surgically absent.  Negative noncontrast liver, spleen, pancreas and adrenal glands.  Left nephrolithiasis  has decreased.  There are punctate intrarenal calculi in the mid pole.  Left parapelvic cysts are suspected.  No left perinephric stranding, definite hydronephrosis, left hydroureter, or left periureteral stranding.  Course of the left ureter is within normal limits.  Phleboliths in the left hemi pelvis are stable.  Right nephrolithiasis is decreased.  There is calcified right renal cortical atrophy now at the lower pole (series 2 image 43).  No definite right intrarenal calculus.  No right hydronephrosis, perinephric stranding, hydroureter, periureteral stranding, or calculus identified along the course of the right ureter.  A small right hemi pelvis phlebolith are stable.  The bladder is decompressed.  There may be a tiny calculus just inside the bladder near the left UVJ (series 2 image 72, sagittal image 108). No abdominal free  fluid.  IMPRESSION: 1.  No definite acute obstructive uropathy and interval decreased bilateral nephrolithiasis. 2.  There may be a punctate stone just inside the bladder near the left UVJ (see sagittal image 108). 3.  New right renal mid pole scarring with dystrophic calcification. 4.  No other acute findings in the abdomen or pelvis:  Negative appendix.  Previous cholecystectomy.  Cardiomegaly.  Pulmonary atelectasis.   Original Report Authenticated By: Roselyn Reef, M.D.      1. Nausea and vomiting   2. CKD (chronic kidney disease), stage III   3. Hypertension   4. Hypokalemia       MDM  63-year-old female with general female ACE. Significantly hypokalemic. Patient has history the same reports that she is poorly compliant with supplementation. Repletion was initiated but given the degree of her hypokalemia, will admit. Appears tired, but she's not toxic. Suspect this is a viral illness, possibly influenza.        Virgel Manifold, MD 12/31/11 [phone]

## 2011-12-31 NOTE — Progress Notes (Signed)
[facility] PROGRESS NOTE  Robyn [name] [id number] DOB: [birth year] DOA: 12/28/2011 PCP: [name], MD  Assessment/Plan: Influenza: Continue with Tamiflu day 3/5.  Nausea and vomiting, myalgia  Possibly related to flu, viral illness  We will provide supportive care with IV fluids, NS @ 75 cc/hr  Continue zofran PRN.  Resolved. Hypertension  Will hold ACE/ARB's sue to CKD and see if any improvement  Resume clonidine.  Anxiety disorder  Continue xanax Tobacco user  Smoking cessation provided  Uses Chantix Hypokalemia  K-CL Change to 40 meq TID. Follow up BMP in am IV kcl supplement. CKD (chronic kidney disease), stage III  hold off on ARB's and ACEI for now and see if some improvement in CKD  Decrease  IV fluids  Improving, Cr decrease to 1.4. Avoid hypotension. Nephrolithiasis  history of nephrolithotomy recently 10/2011  [place] : Mild left hydronephrosis, possible left nephrolithiasis.  CT : 1. No definite acute obstructive uropathy and interval decreased  bilateral nephrolithiasis.  2. There may be a punctate stone just inside the bladder near the  left UVJ (see sagittal image 108) 3. New right renal mid pole scarring with dystrophic  Calcification.  Urologist on call Dr Gaynelle Arabian reviewed CT scan. Recommend follow up with primary urologist, no intervention at this time. He recommend VCG test.   Disposition: Discharge 12-26 depending on K level.  Consultants:  None Procedures:  None Antibiotics:  none   HPI/Subjective: Feeling better. She does not want to have done VCG. She wants to follow up with her urologist.    Objective: Filed Vitals:   12/30/11 1405 12/30/11 2219 12/31/11 0649 12/31/11 0932  BP: 146/83 156/93 199/126 188/115  Pulse: 78 75 81 89  Temp: 98.5 F (36.9 C) 98.3 F (36.8 C) 98.5 F (36.9 C)   TempSrc: Oral Oral Oral   Resp: 20 20 20 22   Height:      Weight:   125.692 kg (277 lb 1.6 oz)   SpO2: 97% 98% 96% 97%     Intake/Output Summary (Last 24 hours) at 12/31/11 1129 Last data filed at 12/31/11 0926  Gross per 24 hour  Intake 3196.67 ml  Output   2801 ml  Net 395.67 ml   Filed Weights   12/29/11 0718 12/30/11 0632 12/31/11 0649  Weight: 126.644 kg (279 lb 3.2 oz) 129.956 kg (286 lb 8 oz) 125.692 kg (277 lb 1.6 oz)    Exam:   General:  No distress  Cardiovascular: S 1, S 2 RRR  Respiratory: CTA  Abdomen: Bs present, soft, NT  Data Reviewed: Basic Metabolic Panel:  Lab 0000000 0440 12/30/11 1724 12/30/11 0443 12/29/11 2316 12/29/11 1250 12/29/11 0422 12/28/11 1650 12/28/11 1236  NA 138 -- 137 -- 133* 139 140 --  K 2.8* 3.2* 3.3* 2.7* 2.7* -- -- --  CL 100 -- 101 -- 96 96 94* --  CO2 28 -- 29 -- 30 32 34* --  GLUCOSE 107* -- 100* -- 131* 122* 104* --  BUN 10 -- 20 -- 22 19 13  --  CREATININE 0.90 -- 1.40* -- 1.91* 2.00* 1.28* --  CALCIUM 8.1* -- 8.1* -- 7.8* 7.9* 8.4 --  MG -- -- -- -- -- 1.8 1.9 1.4*  PHOS -- -- -- -- -- -- 3.5 --   Liver Function Tests:  Lab 12/29/11 0422 12/28/11 1650  AST 33 27  ALT 30 27  ALKPHOS 66 76  BILITOT 0.7 1.0  PROT 6.3 7.0  ALBUMIN 3.1* 3.3*   CBC:  Lab 12/31/11 0440 12/29/11 0422 12/28/11 1650 12/28/11 1236  WBC 4.9 6.1 7.8 9.1  NEUTROABS -- -- 6.3 --  HGB 12.3 10.7* 12.0 13.0  HCT 35.7* 32.2* 34.0* 36.5  MCV 93.9 96.1 94.2 93.4  PLT 241 193 217 246   Cardiac Enzymes: No results found for this basename: CKTOTAL:5,CKMB:5,CKMBINDEX:5,TROPONINI:5 in the last 168 hours BNP (last 3 results)  Basename 12/28/11 1649  PROBNP 331.4*   CBG:  Lab 12/31/11 0759 12/30/11 0752 12/29/11 0744  GLUCAP 168* 108* 123*    Recent Results (from the past 240 hour(s))  URINE CULTURE     Status: Normal   Collection Time   12/28/11  1:49 PM      Component Value Range Status Comment   Specimen Description URINE, CLEAN CATCH   Final    Special Requests NONE   Final    Culture  Setup Time 12/28/2011 19:51   Final    Colony Count 25,000  COLONIES/ML   Final    Culture     Final    Value: Multiple bacterial morphotypes present, none predominant. Suggest appropriate recollection if clinically indicated.   Report Status 12/30/2011 FINAL   Final      Studies: Ct Abdomen Pelvis Wo Contrast  12/30/2011  *RADIOLOGY REPORT*  Clinical Data: 41 year old female with abdominal pain.  History of nephrolithiasis.  Nausea and vomiting.  CT ABDOMEN AND PELVIS WITHOUT CONTRAST  Technique:  Multidetector CT imaging of the abdomen and pelvis was performed following the standard protocol without intravenous contrast.  Comparison: 01/30/2011.  Findings: Increased linear opacity in the lower lobes most compatible with atelectasis.  Cardiomegaly.  No pericardial or pleural effusion.  No acute osseous abnormality identified.  No pelvic free fluid.  Stable and negative noncontrast uterus and adnexa.  Redundant distal colon, the sigmoid extends into the right lower quadrant.  Retained stool in the distal colon right upper quadrant surgical clip adjacent to the ascending colon may be related to the cholecystectomy.  No inflammation of the colon. Normal appendix.  No dilated small bowel.  Small fat containing umbilical hernia is unchanged.  Gas in the distal esophagus. Stomach mildly distended with food debris.  Gallbladder surgically absent.  Negative noncontrast liver, spleen, pancreas and adrenal glands.  Left nephrolithiasis has decreased.  There are punctate intrarenal calculi in the mid pole.  Left parapelvic cysts are suspected.  No left perinephric stranding, definite hydronephrosis, left hydroureter, or left periureteral stranding.  Course of the left ureter is within normal limits.  Phleboliths in the left hemi pelvis are stable.  Right nephrolithiasis is decreased.  There is calcified right renal cortical atrophy now at the lower pole (series 2 image 43).  No definite right intrarenal calculus.  No right hydronephrosis, perinephric stranding, hydroureter,  periureteral stranding, or calculus identified along the course of the right ureter.  A small right hemi pelvis phlebolith are stable.  The bladder is decompressed.  There may be a tiny calculus just inside the bladder near the left UVJ (series 2 image 72, sagittal image 108). No abdominal free fluid.  IMPRESSION: 1.  No definite acute obstructive uropathy and interval decreased bilateral nephrolithiasis. 2.  There may be a punctate stone just inside the bladder near the left UVJ (see sagittal image 108). 3.  New right renal mid pole scarring with dystrophic calcification. 4.  No other acute findings in the abdomen or pelvis:  Negative appendix.  Previous cholecystectomy.  Cardiomegaly.  Pulmonary atelectasis.   Original Report Authenticated By:  Genevie Ann III, M.D.     Scheduled Meds:   . carvedilol  25 mg Oral BID WC  . cloNIDine  0.2 mg Oral TID  . loratadine  10 mg Oral Daily  . multivitamin with minerals  1 tablet Oral Daily  . oseltamivir  75 mg Oral BID  . potassium chloride  10 mEq Intravenous Q1 Hr x 3  . potassium chloride SA  40 mEq Oral TID  . varenicline  1 mg Oral BID   Continuous Infusions:   . sodium chloride 100 mL/hr at 12/30/11 1302    Principal Problem:  *Nausea and vomiting Active Problems:  Anxiety disorder  Tobacco user  Hypokalemia  CKD (chronic kidney disease), stage III  Nephrolithiasis  Hypertension    Time spent: 25 minutes    Bronislaus Verdell  [facility] Pager [phone]. If 8PM-8AM, please contact night-coverage at [email or web address], password [facility] 12/31/2011, 11:29 AM  LOS: 3 days

## 2012-01-01 DIAGNOSIS — F411 Generalized anxiety disorder: Secondary | ICD-10-CM

## 2012-01-01 LAB — BASIC METABOLIC PANEL
BUN: 19 mg/dL (ref 6–23)
BUN: 17 mg/dL (ref 6–23)
CO2: 27 mEq/L (ref 19–32)
CO2: 28 mEq/L (ref 19–32)
Calcium: 8.8 mg/dL (ref 8.4–10.5)
Chloride: 101 mEq/L (ref 96–112)
Creatinine, Ser: 1.39 mg/dL — ABNORMAL HIGH (ref 0.50–1.10)
GFR calc non Af Amer: 38 mL/min — ABNORMAL LOW (ref >90)
Glucose, Bld: 130 mg/dL — ABNORMAL HIGH (ref 70–99)

## 2012-01-01 MED ORDER — AMLODIPINE BESYLATE 10 MG PO TABS: 10.0000 mg | ORAL_TABLET | Freq: Every day | ORAL | Status: DC

## 2012-01-01 MED ORDER — OSELTAMIVIR PHOSPHATE 75 MG PO CAPS: 75.0000 mg | ORAL_CAPSULE | Freq: Two times a day (BID) | ORAL | Status: DC

## 2012-01-01 MED ORDER — SODIUM CHLORIDE 0.9 % IV SOLN
INTRAVENOUS | Status: DC
  Administered 2012-01-01: 08:00:00 via INTRAVENOUS

## 2012-01-01 MED ORDER — POTASSIUM CHLORIDE CRYS ER 20 MEQ PO TBCR: 40.0000 meq | EXTENDED_RELEASE_TABLET | Freq: Three times a day (TID) | ORAL | Status: DC

## 2012-01-01 MED ORDER — GUAIFENESIN ER 600 MG PO TB12: 1200.0000 mg | ORAL_TABLET | Freq: Two times a day (BID) | ORAL | Status: DC | PRN

## 2012-01-01 MED ORDER — POTASSIUM CHLORIDE CRYS ER 20 MEQ PO TBCR
40.0000 meq | EXTENDED_RELEASE_TABLET | Freq: Once | ORAL | Status: AC
  Administered 2012-01-01: 40 meq via ORAL
  Filled 2012-01-01: qty 2

## 2012-01-01 MED ORDER — AMLODIPINE BESYLATE 10 MG PO TABS
10.0000 mg | ORAL_TABLET | Freq: Every day | ORAL | Status: DC
  Administered 2012-01-01: 10 mg via ORAL
  Filled 2012-01-01: qty 1

## 2012-01-01 NOTE — Discharge Summary (Signed)
Physician Discharge Summary  Robyn [name] [id number] DOB: February 11, 1970 DOA: 12/28/2011  PCP: [name], MD  Admit date: 12/28/2011 Discharge date: 01/01/2012  Time spent: 30 minutes  Recommendations for Outpatient Follow-up:  1. FU with PCP and Nephrologist. Need repeat renal function test, adjustment of BP medications. Needs to follow up with urologist for VCG.  Discharge Diagnoses:  Influenza Acute on Chronic Renal failure.   Nausea and vomiting  Anxiety disorder  Tobacco user  Hypokalemia  CKD (chronic kidney disease), stage III  Nephrolithiasis  Hypertension   Discharge Condition: Stable.  Diet recommendation: Robyn [name],  Filed Weights   12/29/11 [id number] 12/30/11 [id number] 12/31/11 0649  Weight: 126.644 kg (279 lb 3.2 oz) 129.956 kg (286 lb 8 oz) 125.692 kg (277 lb 1.6 oz)    History of present illness:   41 year old female with history of HTN, CKD, anxiety and recent nephrolithotomy who presented with worsening nausea and vomiting started 1 day prior to this admission. Patient reported some abdominal pain, cramplike, diffuse. Patient reported not being able to hold food down. She has had 3 episodes of vomiting prior to arrival to ED. No reports of blood in stool or urine. No fever or chills. She does endorse cough productive of yellow sputum but no shortness of breath. No reports of urinary frequency or urgency or dysuria.  In ED patient was found to be hypokalemic with potassium of 2 and remains weak with nausea for which reason [facility] was asked to admit for further evaluation. CXR done in ED showed no active cardiopulmonary process.  Hospital Course:  41 year old presents with fevers, myalgia, nausea vomiting. She was diagnosed with Influenza. Her symptoms have improved. She also develop acute on chronic renal failure, pre renal secondary to decrease volume. [place] and CT scan was done due to prior history of kidney stone. CT review by Dr Gaynelle Arabian who dint recommend  any intervention at this time. She will need to follow up with her primary urologist.   Influenza: Continue with Tamiflu day 3/5.  Nausea and vomiting, myalgia  Possibly related to flu, viral illness  Resolved. Hypertension  Will hold ACE/ARB's sue to CKD and see if any improvement  Resume clonidine and norvasc. Anxiety disorder  Continue xanax Tobacco user  Smoking cessation provided  Uses Chantix Hypokalemia  K-CL Change to 40 meq TID.  She will need to follow with nephrologist for repeat B-met or with PCP.  CKD (chronic kidney disease), stage III  hold off on ARB's and ACEI for now and see if some improvement in CKD  Cr per records at 1.2 and 1.4. Cr during this admission peak to 2. Increase today to 1.6. I resume fluids. Will repeat B-met this afternoon if stable will discharge patient this afternoon with close follow up with Dr Posey Pronto.  Nephrolithiasis  history of nephrolithotomy recently 10/2011  [place] : Mild left hydronephrosis, possible left nephrolithiasis.  CT : 1. No definite acute obstructive uropathy and interval decreased  bilateral nephrolithiasis.  2. There may be a punctate stone just inside the bladder near the  left UVJ (see sagittal image 108) 3. New right renal mid pole scarring with dystrophic  Calcification.  Urologist on call Dr Gaynelle Arabian reviewed CT scan. Recommend follow up with primary urologist, no intervention at this time. He recommend VCG test. Patient wants to have that test outpatient.   Procedures:  None  Consultations:  None.  Discharge Exam: Filed Vitals:   12/31/11 1135 12/31/11 1413 12/31/11 2101 01/01/12 [id number]  BP: 151/111 149/100 135/97 177/106  Pulse:  77 72 77  Temp:  98.5 F (36.9 C) 98 F (36.7 C) 98.2 F (36.8 C)  TempSrc:  Oral Oral Oral  Resp:  20 20 22   Height:      Weight:      SpO2:  99% 99% 99%    General: No distress. Cardiovascular: S 1, S 2 RRR Respiratory: CTA  Discharge Instructions  Discharge Orders     Future Appointments: Provider: Department: Dept Phone: Center:   01/14/2012 11:00 AM [name], [name] [phone] None   01/16/2012 8:45 AM [name], MD [place] [phone] [facility]     Future Orders Please Complete By Expires   Diet - low sodium heart healthy      Increase activity slowly          Medication List     As of 01/01/2012 10:57 AM    STOP taking these medications         aMILoride 5 MG tablet   Commonly known as: MIDAMOR      dextromethorphan 30 MG/5ML liquid   Commonly known as: DELSYM      lisinopril 40 MG tablet   Commonly known as: PRINIVIL,ZESTRIL      TRIBENZOR 40-10-25 MG Tabs   Generic drug: Olmesartan-Amlodipine-HCTZ      TAKE these medications         ALPRAZolam 0.5 MG tablet   Commonly known as: XANAX   Take 1 tablet (0.5 mg total) by mouth at bedtime as needed for sleep.      amLODipine 10 MG tablet   Commonly known as: NORVASC   Take 1 tablet (10 mg total) by mouth daily.      carvedilol 25 MG tablet   Commonly known as: COREG   Take 25 mg by mouth 2 (two) times daily with a meal.      cloNIDine 0.2 MG tablet   Commonly known as: CATAPRES   Take 1 tablet (0.2 mg total) by mouth 3 (three) times daily.      fexofenadine 180 MG tablet   Commonly known as: ALLEGRA   Take 180 mg by mouth daily as needed. For allergies      guaiFENesin 600 MG 12 hr tablet   Commonly known as: MUCINEX   Take 2 tablets (1,200 mg total) by mouth 2 (two) times daily as needed.      multivitamin with minerals tablet   Take 1 tablet by mouth daily.      oseltamivir 75 MG capsule   Commonly known as: TAMIFLU   Take 1 capsule (75 mg total) by mouth 2 (two) times daily.      potassium chloride SA 20 MEQ tablet   Commonly known as: K-DUR,KLOR-CON   Take 2 tablets (40 mEq total) by mouth 3 (three) times daily.      varenicline 1 MG tablet   Commonly known as: CHANTIX   Take 1 tablet (1 mg  total) by mouth 2 (two) times daily.          The results of significant diagnostics from this hospitalization (including imaging, microbiology, ancillary and laboratory) are listed below for reference.    Significant Diagnostic Studies: Ct Abdomen Pelvis Wo Contrast  12/30/2011  *RADIOLOGY REPORT*  Clinical Data: 41 year old female with abdominal pain.  History of nephrolithiasis.  Nausea and vomiting.  CT ABDOMEN AND PELVIS WITHOUT CONTRAST  Technique:  Multidetector CT imaging of  the abdomen and pelvis was performed following the standard protocol without intravenous contrast.  Comparison: 01/30/2011.  Findings: Increased linear opacity in the lower lobes most compatible with atelectasis.  Cardiomegaly.  No pericardial or pleural effusion.  No acute osseous abnormality identified.  No pelvic free fluid.  Stable and negative noncontrast uterus and adnexa.  Redundant distal colon, the sigmoid extends into the right lower quadrant.  Retained stool in the distal colon right upper quadrant surgical clip adjacent to the ascending colon may be related to the cholecystectomy.  No inflammation of the colon. Normal appendix.  No dilated small bowel.  Small fat containing umbilical hernia is unchanged.  Gas in the distal esophagus. Stomach mildly distended with food debris.  Gallbladder surgically absent.  Negative noncontrast liver, spleen, pancreas and adrenal glands.  Left nephrolithiasis has decreased.  There are punctate intrarenal calculi in the mid pole.  Left parapelvic cysts are suspected.  No left perinephric stranding, definite hydronephrosis, left hydroureter, or left periureteral stranding.  Course of the left ureter is within normal limits.  Phleboliths in the left hemi pelvis are stable.  Right nephrolithiasis is decreased.  There is calcified right renal cortical atrophy now at the lower pole (series 2 image 43).  No definite right intrarenal calculus.  No right hydronephrosis, perinephric  stranding, hydroureter, periureteral stranding, or calculus identified along the course of the right ureter.  A small right hemi pelvis phlebolith are stable.  The bladder is decompressed.  There may be a tiny calculus just inside the bladder near the left UVJ (series 2 image 72, sagittal image 108). No abdominal free fluid.  IMPRESSION: 1.  No definite acute obstructive uropathy and interval decreased bilateral nephrolithiasis. 2.  There may be a punctate stone just inside the bladder near the left UVJ (see sagittal image 108). 3.  New right renal mid pole scarring with dystrophic calcification. 4.  No other acute findings in the abdomen or pelvis:  Negative appendix.  Previous cholecystectomy.  Cardiomegaly.  Pulmonary atelectasis.   Original Report Authenticated By: Roselyn Reef, M.D.    Dg Chest 2 View  12/28/2011  *RADIOLOGY REPORT*  Clinical Data: Cough  CHEST - 2 VIEW  Comparison: 10/09/2011  Findings: Cardiomediastinal silhouette is stable.  No acute infiltrate or pleural effusion.  No pulmonary edema.  Stable mild degenerative changes thoracic spine.  IMPRESSION: No active disease.  No significant change.   Original Report Authenticated By: Lahoma Crocker, M.D.    US Renal Port  12/29/2011  *RADIOLOGY REPORT*  Clinical Data: Renal failure.  Previous nephrolithotomy.  RENAL/URINARY TRACT ULTRASOUND COMPLETE  Comparison:  10/15/2011 and earlier studies  Findings:  Right Kidney:  12 x 1 cm. No hydronephrosis.  Well-preserved cortex.  Normal size and parenchymal echotexture without focal abnormalities.  Left Kidney:  12 cm.  Mild hydronephrosis.  Echogenic focus in the periphery of the renal collecting system in the upper poles suggests calculus.  Bladder:  Decompressed.  IMPRESSION:  1.  Mild left hydronephrosis, possible left nephrolithiasis.   Original Report Authenticated By: D. Wallace Going, MD     Microbiology: Recent Results (from the past 240 hour(s))  URINE CULTURE     Status: Normal    Collection Time   12/28/11  1:49 PM      Component Value Range Status Comment   Specimen Description URINE, CLEAN CATCH   Final    Special Requests NONE   Final    Culture  Setup Time 12/28/2011 19:51   Final  Colony Count 25,000 COLONIES/ML   Final    Culture     Final    Value: Multiple bacterial morphotypes present, none predominant. Suggest appropriate recollection if clinically indicated.   Report Status 12/30/2011 FINAL   Final      Labs: Basic Metabolic Panel:  Lab [id number] 12/31/11 1608 12/31/11 0440 12/30/11 1724 12/30/11 0443 12/29/11 1250 12/29/11 0422 12/28/11 1650 12/28/11 1236  NA 136 -- 138 -- 137 133* 139 -- --  K 3.2* 3.2* 2.8* 3.2* 3.3* -- -- -- --  CL 99 -- 100 -- 101 96 96 -- --  CO2 27 -- 28 -- 29 30 32 -- --  GLUCOSE 130* -- 107* -- 100* 131* 122* -- --  BUN 19 -- 10 -- 20 22 19  -- --  CREATININE 1.64* -- 0.90 -- 1.40* 1.91* 2.00* -- --  CALCIUM 8.8 -- 8.1* -- 8.1* 7.8* 7.9* -- --  MG -- -- -- -- -- -- 1.8 1.9 1.4*  PHOS -- -- -- -- -- -- -- 3.5 --   Liver Function Tests:  Lab 12/29/11 0422 12/28/11 1650  AST 33 27  ALT 30 27  ALKPHOS 66 76  BILITOT 0.7 1.0  PROT 6.3 7.0  ALBUMIN 3.1* 3.3*   CBC:  Lab 12/31/11 0440 12/29/11 0422 12/28/11 1650 12/28/11 1236  WBC 4.9 6.1 7.8 9.1  NEUTROABS -- -- 6.3 --  HGB 12.3 10.7* 12.0 13.0  HCT 35.7* 32.2* 34.0* 36.5  MCV 93.9 96.1 94.2 93.4  PLT 241 193 217 246   Cardiac Enzymes: No results found for this basename: CKTOTAL:5,CKMB:5,CKMBINDEX:5,TROPONINI:5 in the last 168 hours BNP: BNP (last 3 results)  Basename 12/28/11 1649  PROBNP 331.4*   CBG:  Lab 01/01/12 0823 12/31/11 0759 12/30/11 0752 12/29/11 0744  GLUCAP 176* 168* 108* 123*       Signed:  REGALADO,BELKYS  [facility] 01/01/2012, 10:57 AM

## 2012-01-14 ENCOUNTER — Encounter: Payer: Self-pay | Admitting: Obstetrics and Gynecology

## 2012-01-14 ENCOUNTER — Ambulatory Visit (INDEPENDENT_AMBULATORY_CARE_PROVIDER_SITE_OTHER): Payer: 59 | Admitting: Obstetrics and Gynecology

## 2012-01-14 VITALS — BP 144/90 | HR 84 | Ht 65.0 in | Wt 285.0 lb

## 2012-01-14 DIAGNOSIS — R350 Frequency of micturition: Secondary | ICD-10-CM

## 2012-01-14 DIAGNOSIS — Z1231 Encounter for screening mammogram for malignant neoplasm of breast: Secondary | ICD-10-CM

## 2012-01-14 DIAGNOSIS — R638 Other symptoms and signs concerning food and fluid intake: Secondary | ICD-10-CM

## 2012-01-14 DIAGNOSIS — Z01419 Encounter for gynecological examination (general) (routine) without abnormal findings: Secondary | ICD-10-CM

## 2012-01-14 DIAGNOSIS — Z124 Encounter for screening for malignant neoplasm of cervix: Secondary | ICD-10-CM

## 2012-01-14 DIAGNOSIS — Z139 Encounter for screening, unspecified: Secondary | ICD-10-CM

## 2012-01-14 LAB — CBC
MCV: 87.5 fL (ref 78.0–100.0)
Platelets: 307 10*3/uL (ref 150–400)
RDW: 13.7 % (ref 11.5–15.5)
WBC: 9.8 10*3/uL (ref 4.0–10.5)

## 2012-01-14 LAB — POCT URINALYSIS DIPSTICK
Bilirubin, UA: NEGATIVE
Glucose, UA: NEGATIVE
Ketones, UA: NEGATIVE

## 2012-01-14 NOTE — Progress Notes (Signed)
The patient reports:she complains of FEELING TIRED AND HOT FLASHESContraception:bilateral tubal ligation  Last mammogram: TIME TO SCHEDULE MAMMO Last pap:  July  2012  NL  GC/Chlamydia cultures offered: declined HIV/RPR/HbsAg offered:  declined HSV 1 and 2 glycoprotein offered: declined  Menstrual cycle regular and monthly: No: PT HAD ABLATION  Urinary symptoms: urinary frequency AND WHEN PT COUGH; SHE LEAKS Normal bowel movements: Yes Reports abuse at home: No:    C/o hot flashes and numbness in hands and feet  Filed Vitals:   01/14/12 1119  BP: 144/90  Pulse: 84   ROS: noncontributory  Physical Examination: General appearance - alert, well appearing, and in no distress Neck - supple, no significant adenopathy Chest - clear to auscultation, no wheezes, rales or rhonchi, symmetric air entry Heart - normal rate and regular rhythm Abdomen - soft, nontender, nondistended, no masses or organomegaly Breasts - breasts appear normal, no suspicious masses, no skin or nipple changes or axillary nodes Pelvic - normal external genitalia, vulva, vagina, cervix, uterus and adnexa, difficult Back exam - no CVAT Extremities - no edema, redness or tenderness in the calves or thighs  A/P Morbid obesity No cycles secondary to ablation Labs-hgbA1c, tsh,cbc,vitd,fsh Pelvic u/s secondary to habitus mammo screening [facility]

## 2012-01-15 LAB — VITAMIN D 25 HYDROXY (VIT D DEFICIENCY, FRACTURES): Vit D, 25-Hydroxy: 25 ng/mL — ABNORMAL LOW (ref 30–89)

## 2012-01-15 LAB — URINE CULTURE: Colony Count: 35000

## 2012-01-15 LAB — FOLLICLE STIMULATING HORMONE: FSH: 2.6 m[IU]/mL

## 2012-01-15 LAB — TSH: TSH: 2.972 u[IU]/mL (ref 0.350–4.500)

## 2012-01-15 LAB — HEMOGLOBIN A1C
Hgb A1c MFr Bld: 6 % — ABNORMAL HIGH (ref <5.7)
Mean Plasma Glucose: 126 mg/dL — ABNORMAL HIGH (ref <117)

## 2012-01-16 ENCOUNTER — Ambulatory Visit: Payer: 59 | Admitting: Family Medicine

## 2012-01-16 ENCOUNTER — Telehealth: Payer: Self-pay | Admitting: Obstetrics and Gynecology

## 2012-01-16 NOTE — Telephone Encounter (Signed)
Ar pt 

## 2012-01-16 NOTE — Telephone Encounter (Signed)
Left message for pt to return call. Pt needs Vit-d protocol. Robyn [name]

## 2012-01-19 MED ORDER — ERGOCALCIFEROL 1.25 MG (50000 UT) PO CAPS: ORAL_CAPSULE | ORAL | Status: DC

## 2012-01-19 NOTE — Telephone Encounter (Signed)
Advised pt of vitamin D and med called into pharmacy.  [place] Lanius, [place]

## 2012-01-20 ENCOUNTER — Telehealth: Payer: Self-pay

## 2012-01-20 DIAGNOSIS — E559 Vitamin D deficiency, unspecified: Secondary | ICD-10-CM

## 2012-01-21 ENCOUNTER — Other Ambulatory Visit: Payer: Self-pay | Admitting: Obstetrics and Gynecology

## 2012-01-21 DIAGNOSIS — R638 Other symptoms and signs concerning food and fluid intake: Secondary | ICD-10-CM

## 2012-01-22 ENCOUNTER — Ambulatory Visit: Payer: 59 | Admitting: Obstetrics and Gynecology

## 2012-01-22 ENCOUNTER — Ambulatory Visit: Payer: 59

## 2012-01-22 ENCOUNTER — Encounter: Payer: Self-pay | Admitting: Family Medicine

## 2012-01-22 ENCOUNTER — Ambulatory Visit (INDEPENDENT_AMBULATORY_CARE_PROVIDER_SITE_OTHER): Payer: 59 | Admitting: Family Medicine

## 2012-01-22 ENCOUNTER — Encounter: Payer: Self-pay | Admitting: Obstetrics and Gynecology

## 2012-01-22 VITALS — BP 126/90 | Resp 16 | Ht 65.0 in | Wt 283.0 lb

## 2012-01-22 VITALS — BP 128/102 | HR 74 | Temp 98.3°F | Resp 16 | Ht 64.5 in | Wt 283.6 lb

## 2012-01-22 DIAGNOSIS — R7303 Prediabetes: Secondary | ICD-10-CM | POA: Insufficient documentation

## 2012-01-22 DIAGNOSIS — I10 Essential (primary) hypertension: Secondary | ICD-10-CM

## 2012-01-22 DIAGNOSIS — R7309 Other abnormal glucose: Secondary | ICD-10-CM

## 2012-01-22 DIAGNOSIS — G8929 Other chronic pain: Secondary | ICD-10-CM | POA: Insufficient documentation

## 2012-01-22 DIAGNOSIS — R638 Other symptoms and signs concerning food and fluid intake: Secondary | ICD-10-CM

## 2012-01-22 DIAGNOSIS — R600 Localized edema: Secondary | ICD-10-CM

## 2012-01-22 DIAGNOSIS — R102 Pelvic and perineal pain: Secondary | ICD-10-CM

## 2012-01-22 DIAGNOSIS — D219 Benign neoplasm of connective and other soft tissue, unspecified: Secondary | ICD-10-CM

## 2012-01-22 DIAGNOSIS — E876 Hypokalemia: Secondary | ICD-10-CM

## 2012-01-22 DIAGNOSIS — R5381 Other malaise: Secondary | ICD-10-CM

## 2012-01-22 DIAGNOSIS — M545 Low back pain: Secondary | ICD-10-CM | POA: Insufficient documentation

## 2012-01-22 DIAGNOSIS — R609 Edema, unspecified: Secondary | ICD-10-CM

## 2012-01-22 DIAGNOSIS — R5383 Other fatigue: Secondary | ICD-10-CM

## 2012-01-22 LAB — BASIC METABOLIC PANEL
CO2: 26 mEq/L (ref 19–32)
Calcium: 9.2 mg/dL (ref 8.4–10.5)
Glucose, Bld: 70 mg/dL (ref 70–99)
Sodium: 142 mEq/L (ref 135–145)

## 2012-01-22 MED ORDER — CLONIDINE HCL 0.2 MG PO TABS: 0.2000 mg | ORAL_TABLET | Freq: Three times a day (TID) | ORAL | Status: DC

## 2012-01-22 MED ORDER — AMLODIPINE BESYLATE 10 MG PO TABS: 10.0000 mg | ORAL_TABLET | Freq: Every day | ORAL | Status: DC

## 2012-01-22 MED ORDER — POTASSIUM CHLORIDE CRYS ER 20 MEQ PO TBCR: 40.0000 meq | EXTENDED_RELEASE_TABLET | Freq: Three times a day (TID) | ORAL | Status: DC

## 2012-01-22 MED ORDER — CARVEDILOL 25 MG PO TABS: 25.0000 mg | ORAL_TABLET | Freq: Two times a day (BID) | ORAL | Status: DC

## 2012-01-22 NOTE — Progress Notes (Signed)
Here to f/u u/s secondary to pelvic pain.  Pain worsens when bladder is full.  Change in BM with change in diet.  Filed Vitals:   01/22/12 1650  BP: 126/90  Resp: 16   U/S - ut 8.6 x 5.2 x 5.3cm, nl bilateral ovaries, lt ovarian simple cyst meas 3.4cm, 2 fibroids largest 2.8cm, fluid filled cavity s/p ablation  A/P Repeat u/s in 2-78mths for f/u left ovarian simple cyst Refer to GI and to Urology, Alona Bene Refer to nutritionist If sxs neither GI or Urology, will revisit at [facility]

## 2012-01-22 NOTE — Progress Notes (Signed)
S:  This 42 y.o. AA female with chronic HTN and hypokalemia, recently hospitalized for Flu and dehydration. She was discharged 01/01/12; feels much better -no cough or fever. Has fatigue and muscle weakness.  Pt now has bilat lower ext edema , onset few days after leaving hospital. New medication prescribed for HTN- Amlodipine 10 mg daily. Swelling has worsened and pt c/o leg discomfort and tightness. She has f/u w/ Dr. Posey Pronto (nephrologist) later this month.   Pt has concerns about Diabetes given frequent FSBS in hospital and family history. A1c inpatient= 6.0%. She would like to see a  Nutritionist for advise and help w/ weight loss.  Pt c/o LBP w/ radiation to right posterior hip for many months. Current job requires sitting most of the day.  ROS: As per HPI.  O:  Filed Vitals:   01/22/12 1338 01/22/12 1356  BP: 148/96 128/102  Pulse: 74   Temp: 98.3 F (36.8 C)   TempSrc: Oral   Resp: 16   Height: 5' 4.5" (1.638 m)   Weight: 283 lb 9.6 oz (128.64 kg)   SpO2: 97%    GEN: In NAD; WN, WD (obese) HENT: [place]/AT; EOMI w/ clear conj/ sclerae. Oroph clear and moist. NECK: No LAN or TMG (gland feels slightly full). COR: RRR; no m/g/r. Lower ext- 1+ pitting edema. LUNGS: CTA; no wheezes or rales. SKIN: W&D. BACK: Tender lower lumbar area, esp to right at SI joint. Forward flexion to >60 degrees NEURO: A&O x 3; CNs intact. Gait normal. DTRs 1-2+/=. Pt walks on toes w/ difficulty.  A/P:  1. Chronic hypokalemia  Basic metabolic panel Amb ref to Medical Nutrition Therapy-MNT  2. Fatigue - pt just started Vit D supplement   3. Subclinical diabetes mellitus -Fam Hx + Amb ref to Medical Nutrition Therapy-MNT  4. Edema of both legs  Suspect Amlodipine side effect; pt agrees to continue medication until she sees Dr. Posey Pronto.  5. HTN (hypertension) - not at goal. Refill all current medications.-  6. Chronic low back pain  Lumbar strain due to weight; also may have DDD but will xray at later date if  symptoms persist.    Discussed lab- Aldosterone and renin w/ ratio; cost of test= $290.00. I will defer to Dr.Patel to determine necessity of this test and its usefulness in treatment of this pt w/ HTN and chronic hypokalemia.

## 2012-01-22 NOTE — Patient Instructions (Addendum)
I have placed a referral to the Nutritionist for you; someone will be calling you with that appointment.  The blood tests that I was considering : Aldosterone and renin ratio (cost of test=$290.00). You may want to discuss whether these tests are something that Dr. Posey Pronto thinks will help guide his treatment of Hypertension and kidney function. Continue current medications until you see Dr. Posey Pronto.  Low back pain is most likely related to your weight and the extra work that your back muscles have to do because of poor abdominal muscle tone. Weight loss could lessen this pain.

## 2012-01-25 ENCOUNTER — Encounter: Payer: Self-pay | Admitting: Family Medicine

## 2012-01-28 NOTE — Telephone Encounter (Signed)
LM for pt that Mel is working on referrals to GI, Urology and Nutritionist. She will call pt with appt dates and times when she is done. Carloyn Manner A

## 2012-02-02 ENCOUNTER — Telehealth: Payer: Self-pay | Admitting: Radiology

## 2012-02-02 NOTE — Telephone Encounter (Signed)
Call from Dr Posey Pronto, [facility] they need most recent labs faxed. BMET from Jan faxed to [phone]

## 2012-02-05 ENCOUNTER — Other Ambulatory Visit: Payer: Self-pay | Admitting: Family Medicine

## 2012-02-05 NOTE — Telephone Encounter (Signed)
Alprazolam refills (#30 w/ 1 RF) called to [facility].

## 2012-02-09 ENCOUNTER — Ambulatory Visit
Admission: RE | Admit: 2012-02-09 | Discharge: 2012-02-09 | Disposition: A | Discharge status: Home / Self Care | Payer: 59 | Source: Ambulatory Visit | Attending: Obstetrics and Gynecology | Admitting: Obstetrics and Gynecology

## 2012-02-09 DIAGNOSIS — Z1231 Encounter for screening mammogram for malignant neoplasm of breast: Secondary | ICD-10-CM

## 2012-02-19 ENCOUNTER — Ambulatory Visit: Payer: 59 | Admitting: *Deleted

## 2012-02-21 ENCOUNTER — Other Ambulatory Visit: Payer: Self-pay

## 2012-03-19 ENCOUNTER — Encounter: Payer: Self-pay | Admitting: Family Medicine

## 2012-03-19 DIAGNOSIS — R760 Raised antibody titer: Secondary | ICD-10-CM | POA: Insufficient documentation

## 2012-04-01 ENCOUNTER — Encounter: Payer: Self-pay | Admitting: *Deleted

## 2012-04-01 ENCOUNTER — Encounter: Payer: 59 | Attending: Family Medicine | Admitting: *Deleted

## 2012-04-01 VITALS — Ht 65.0 in | Wt 282.9 lb

## 2012-04-01 DIAGNOSIS — R7303 Prediabetes: Secondary | ICD-10-CM

## 2012-04-01 DIAGNOSIS — Z713 Dietary counseling and surveillance: Secondary | ICD-10-CM | POA: Insufficient documentation

## 2012-04-01 DIAGNOSIS — E876 Hypokalemia: Secondary | ICD-10-CM | POA: Insufficient documentation

## 2012-04-01 DIAGNOSIS — R7309 Other abnormal glucose: Secondary | ICD-10-CM | POA: Insufficient documentation

## 2012-04-01 NOTE — Progress Notes (Signed)
Diabetes Self-Management Education  Visit Number: First/Initial  04/01/2012 [name], identified by name and date of birth, is a 42 y.o. female with Diabetes Type: Pre-Diabetes.  Other people present during visit:  Patient   ASSESSMENT  Patient Concerns:  Healthy Lifestyle;Nutrition/Meal planning;Weight Control  Height 5\' 5"  (1.651 m), weight 282 lb 14.4 oz (128.323 kg). Body mass index is 47.08 kg/(m^2).  Lab Results: LDL Cholesterol  Date Value Range Status  08/20/2010 78  0 - 99 mg/dL Final                Total Cholesterol/HDL:CHD Risk     Coronary Heart Disease Risk Table                         Men   Women      1/2 Average Risk   3.4   3.3      Average Risk       5.0   4.4      2 X Average Risk   9.6   7.1      3 X Average Risk  23.4   11.0                Use the calculated Patient Ratio     above and the CHD Risk Table     to determine the patient's CHD Risk.                ATP III CLASSIFICATION (LDL):      <100     mg/dL   Optimal      100-129  mg/dL   Near or Above                        Optimal      130-159  mg/dL   Borderline      160-189  mg/dL   High      >190     mg/dL   Very High     Hemoglobin A1C  Date Value Range Status  01/14/2012 6.0* <5.7 % Final                                                                                According to the ADA Clinical Practice Recommendations for 2011, when     HbA1c is used as a screening test:             >=6.5%   Diagnostic of Diabetes Mellitus                (if abnormal result is confirmed)           5.7-6.4%   Increased risk of developing Diabetes Mellitus           References:Diagnosis and Classification of Diabetes Mellitus,Diabetes     [id number] 1):S62-S69 and Standards of Medical Care in             Diabetes - 2011,Diabetes Care,2011,34 (Suppl 1):S11-S61.           Family History  Problem Relation Age of Onset  . Diabetes Mother   . Hypertension Mother   . Hypertension  Father   . Diabetes Father   . Kidney disease Father   . Diabetes Paternal Grandmother   . Diabetes Maternal Grandmother   . Heart disease Maternal Grandmother    History  Substance Use Topics  . Smoking status: Former Smoker -- 0.25 packs/day for 15 years  . Smokeless tobacco: Never Used  . Alcohol Use: Yes     Comment: OCCAS    Support Systems:  Friends;Parents Special Needs:  None  Prior DM Education:  not much, her parents and grandparents have diabetes Daily Foot Exams: N/A Patient Belief / Attitude about Diabetes:  Diabetes is a death sentence  Assessment comments: Robyn [name] has some misinformation about diabetes, eating healthy, dieting, etc.    Diet Recall:  B: may skip 3 days/week.  May have hot meals: eggs, bacon, sausage, pancakes S: chocolate L: fast food S: popcorn, chips D: meat, vegetable S: ice cream  Drinks: soda  Don't have family meals.  Eats in living room while watching tv.  Eats quickly. Emotional eating.  Feels out of control sometimes.  Admits to feeling guilty after consuming large quantities.  No compensatory behaviors.  Tried Weight Watchers 2.  Forced herself to exercise.  At heaviest point she's ever been.  She feels the worst now.  Her lowest weight was around 150-170; as an older adults, stayed around 250.  Felt healthy around 180.      Individualized Plan for Diabetes Self-Management Training:  1. intuitive eating: Encouraged patient to reject traditional diet mentality of "good" vs "bad" foods.  There are no good and bad foods, but rather food is fuel that we needs for our bodies.  When we don't get enough fuel, our bodies suffer the metabolic consequences.  Encouraged patient to eat whatever foods will satisfy them, regardless of their nutritional value.  We will discuss nutritional values of foods at a subsequent appointment.  Encouraged patient to honor their body's internal hunger and fullness cues.  Throughout the day, check in mentally  and rate hunger.  Try not to eat when ravenous, but instead when slightly hungry.  Then choose food(s) that will be satisfying regardless of nutritional content.  Sit down to enjoy those foods.  Minimize distractions: turn off tv, put away books, work, Brewing technologist.  Make the meal last at least 20 minutes in order to give time to experience and register satiety.  Stop eating when full regardless of how much food is left on the plate.  Get more if still hungry.  The key is to honor fullness so throughout the meal, rate fullness factor and stop when comfortably full, but not stuffed.  Reminded patient that they can have any food they want, whenever they want, and however much they want.  Eventually the novelty will wear out and each food will be equal in terms of its emotional appeal.  This will be a learning process and some days more food will be eaten, some days less.  The key is to honor hunger and fullness without any feelings of guilt.  Pay attention to what the internal cues are, rather than any external factors.   Education Topics Reviewed with Patient Today:  Topic Points Discussed  Disease State Definition of diabetes, type 1 and 2, and the diagnosis of diabetes;Factors that contribute to the development of diabetes;Explored patient's options for treatment of their diabetes  Nutrition Management Role of diet in the treatment of diabetes and the relationship between the three main macronutrients and blood glucose level;Food label reading,  portion sizes and measuring food.;Carbohydrate counting  Physical Activity and Exercise  phyisical activity is important for overall health, but I did not stress it today.  Robyn [name] isn't active and doesn't enjoy activity.  She is anxious about starting a walking routine with a friend  Medications    Monitoring    Acute Complications    Chronic Complications  discussed preventing chronic complication with lifestyle management    PATIENTS GOALS   Learning  Objective(s):     Goal The patient agrees to:  Nutrition  honor hunger cues, feel fullness cues  Physical Activity  start low and go low   Also gave handout on high potassium foods  Outcomes  Self-Care Barriers:   emotional eating.  Will try to find anther activity instead of eating when lonely, bored, sad, etc   If problems or questions, patient to contact team via:  Phone  Time in: 1500     Time out: 1600  Future DSME appointment: Yolanda Manges 04/01/2012 3:13 PM

## 2012-04-19 ENCOUNTER — Other Ambulatory Visit: Payer: Self-pay | Admitting: Family Medicine

## 2012-04-20 NOTE — Telephone Encounter (Signed)
Alprazolam refill called to pharmacy.

## 2012-05-06 ENCOUNTER — Ambulatory Visit: Payer: 59 | Admitting: *Deleted

## 2012-05-31 ENCOUNTER — Ambulatory Visit (INDEPENDENT_AMBULATORY_CARE_PROVIDER_SITE_OTHER): Payer: 59 | Admitting: Family Medicine

## 2012-05-31 ENCOUNTER — Ambulatory Visit: Payer: 59

## 2012-05-31 VITALS — BP 145/85 | HR 66 | Temp 98.1°F | Resp 18 | Ht 65.0 in | Wt 286.0 lb

## 2012-05-31 DIAGNOSIS — M545 Low back pain, unspecified: Secondary | ICD-10-CM

## 2012-05-31 DIAGNOSIS — R300 Dysuria: Secondary | ICD-10-CM

## 2012-05-31 DIAGNOSIS — R5381 Other malaise: Secondary | ICD-10-CM

## 2012-05-31 DIAGNOSIS — R102 Pelvic and perineal pain: Secondary | ICD-10-CM

## 2012-05-31 DIAGNOSIS — H539 Unspecified visual disturbance: Secondary | ICD-10-CM

## 2012-05-31 DIAGNOSIS — R5383 Other fatigue: Secondary | ICD-10-CM

## 2012-05-31 DIAGNOSIS — R109 Unspecified abdominal pain: Secondary | ICD-10-CM

## 2012-05-31 LAB — POCT URINALYSIS DIPSTICK
Bilirubin, UA: NEGATIVE
Blood, UA: NEGATIVE
Nitrite, UA: NEGATIVE
pH, UA: 7

## 2012-05-31 LAB — POCT UA - MICROSCOPIC ONLY
Bacteria, U Microscopic: NEGATIVE
Casts, Ur, LPF, POC: NEGATIVE
Crystals, Ur, HPF, POC: NEGATIVE
Mucus, UA: NEGATIVE

## 2012-05-31 LAB — POCT CBC
Lymph, poc: 1.7 (ref 0.6–3.4)
MCH, POC: 31.6 pg — AB (ref 27–31.2)
MCHC: 14.4 g/dL — AB (ref 31.8–35.4)
MID (cbc): 0.5 (ref 0–0.9)
MPV: 7.2 fL (ref 0–99.8)
POC LYMPH PERCENT: 23.2 %L (ref 10–50)
POC MID %: 6.9 %M (ref 0–12)
Platelet Count, POC: 281 10*3/uL (ref 142–424)
RDW, POC: 14.4 %
WBC: 7.3 10*3/uL (ref 4.6–10.2)

## 2012-05-31 LAB — POCT WET PREP WITH KOH
Clue Cells Wet Prep HPF POC: POSITIVE
RBC Wet Prep HPF POC: NEGATIVE

## 2012-05-31 MED ORDER — METRONIDAZOLE 0.75 % VA GEL: VAGINAL | Status: DC

## 2012-05-31 NOTE — Progress Notes (Addendum)
[facility] [address] [place] [address] [phone]  Date:  05/31/2012   Name:  Robyn [name]   DOB:  22-Nov-1970   MRN:  [id number]  PCP:  [name], MD    Chief Complaint: Back Pain, Fatigue and Urinary Frequency   History of Present Illness:  Robyn [name] is a 42 y.o. very pleasant female patient who presents with the following:  She has noted pain in her left lower back since Friday.  The pain radiates to her left side/ lower abdomen.  She has noted fatigue, felt lightheaded, and her vision has been blurry.  She describes this is a slight blurring her vision, things are not as sharp as usual. She has felt "off balance" for the last several days as well.    She is having frequent urination.  No dysuria, no hematuria She has felt thirsty.   She has noted some constipation.   She has been eating normally No fever noted, no chills or body aches  No CP, no SOB She has noted some discomfort during sex as of late, no other vaginal symptoms.    LMP- she has had a partial ablation and does not generally have bleeding any more.    Patient Active Problem List   Diagnosis Date Noted  . Positive Helicobacter pylori titer 03/19/2012  . Chronic low back pain 01/22/2012  . Subclinical diabetes mellitus 01/22/2012  . Obesity   . Nausea and vomiting 12/28/2011  . Hypertension 12/28/2011  . Nephrolithiasis 10/15/2011  . Hypokalemia 08/22/2011  . CKD (chronic kidney disease), stage III 08/22/2011  . Anxiety disorder 07/29/2011  . Tobacco user 07/29/2011    Past Medical History  Diagnosis Date  . Hypertension   . Shortness of breath     ONLY WITH ANXIETY  . Anxiety     OCCAS PANIC ATTACKS  . Blood transfusion 1990'S  . Hypokalemia     PAST HX  . Abnormal Pap smear   . H/O varicella   . Pregnancy induced hypertension   . Depression 2010  . Weight loss 2010  . Increased BMI   . H/O dysmenorrhea   . Ovarian cyst 2011  . Fibroid  2011  . H/O: menorrhagia 2011  . BV (bacterial vaginosis)   . Perimenopausal symptoms 07/15/2010  . Sleep apnea     PT USES CPAP SOMETIMES - SETTING IS 3  . Kidney stones   . GERD (gastroesophageal reflux disease)     occasional  . Cough     nonproductive cough last 2 weeks  . Obesity   . Allergy   . Blood transfusion without reported diagnosis     Past Surgical History  Procedure Laterality Date  . Percutaneous nephrostomy around 1996  may 2013    right kidney  . Uterine ablation march 2010  march 2011  . C-sections x 2    . Nephrolithotomy  04/09/2011    Procedure: NEPHROLITHOTOMY PERCUTANEOUS;  Surgeon: Molli Hazard, MD;  Location: [facility];  Service: Urology;  Laterality: Right;      . Ureteroscopy  04/09/2011    Procedure: URETEROSCOPY;  Surgeon: Molli Hazard, MD;  Location: [facility];  Service: Urology;  Laterality: Right;  . Tubal ligation  1999  . Nephrolithotomy  10/15/2011    Procedure: NEPHROLITHOTOMY PERCUTANEOUS;  Surgeon: Molli Hazard, MD;  Location: [facility];  Service: Urology;  Laterality: Left;  . Removal of stones  10/15/2011    Procedure: REMOVAL  OF STONES;  Surgeon: Molli Hazard, MD;  Location: [facility];  Service: Urology;  Laterality: Left;  . Cholecystectomy  1994  . Cesarean section  1993 and 1999    History  Substance Use Topics  . Smoking status: Former Smoker -- 0.25 packs/day for 15 years  . Smokeless tobacco: Never Used  . Alcohol Use: Yes     Comment: OCCAS    Family History  Problem Relation Age of Onset  . Diabetes Mother   . Hypertension Mother   . Hypertension Father   . Diabetes Father   . Kidney disease Father   . Diabetes Paternal Grandmother   . Diabetes Maternal Grandmother   . Heart disease Maternal Grandmother     Allergies  Allergen Reactions  . Sulfa Antibiotics Hives and Rash    Medication list has been reviewed and updated.  Current Outpatient Prescriptions on File Prior to Visit   Medication Sig Dispense Refill  . ALPRAZolam (XANAX) 0.5 MG tablet TAKE ONE TABLET BY MOUTH EVERY DAY AT BEDTIME AS NEEDED FOR SLEEP  30 tablet  1  . amLODipine (NORVASC) 10 MG tablet Take 1 tablet (10 mg total) by mouth daily.  30 tablet  5  . carvedilol (COREG) 25 MG tablet Take 1 tablet (25 mg total) by mouth 2 (two) times daily with a meal.  60 tablet  5  . cloNIDine (CATAPRES) 0.2 MG tablet Take 1 tablet (0.2 mg total) by mouth 3 (three) times daily.  90 tablet  5  . fexofenadine (ALLEGRA) 180 MG tablet Take 180 mg by mouth daily as needed. For allergies      . Multiple Vitamins-Minerals (MULTIVITAMIN WITH MINERALS) tablet Take 1 tablet by mouth daily.      . potassium chloride SA (K-DUR,KLOR-CON) 20 MEQ tablet Take 2 tablets (40 mEq total) by mouth 3 (three) times daily.  90 tablet  0  . ergocalciferol (VITAMIN D2) 50000 UNITS capsule One tablet po once weekly x 12 weeks  12 capsule  0   No current facility-administered medications on file prior to visit.    Review of Systems:  As per HPI- otherwise negative.   Physical Examination: Filed Vitals:   05/31/12 1558  BP: 138/92  Pulse: 71  Temp: 98.1 F (36.7 C)  Resp: 18   Filed Vitals:   05/31/12 1558  Height: 5\' 5"  (1.651 m)  Weight: 286 lb (129.729 kg)   Body mass index is 47.59 kg/(m^2). Ideal Body Weight: Weight in (lb) to have BMI = 25: 149.9  GEN: WDWN, NAD, Non-toxic, A & O x 3, morbid obesity HEENT: Atraumatic, Normocephalic. Neck supple. No masses, No LAD.  Bilateral TM wnl, oropharynx normal.  PEERL,EOMI.   Ears and Nose: No external deformity. CV: RRR, No M/G/R. No JVD. No thrill. No extra heart sounds. PULM: CTA B, no wheezes, crackles, rhonchi. No retractions. No resp. distress. No accessory muscle use. ABD: S, NT, ND, +BS. No rebound. No HSM. Left CVA tenderness Mild tenderness all over her abdomen, in he LUQ, LLQ, and right mid abdomen.  No RLQ tenderness.   GU: normal exam, no adnexal tenderness or  masses.  No CMT, no unusual discharge.   EXTR: No c/c/e NEURO Normal gait. Normal UE and LE strength, sensation and DTR.  Negative arm drift.  Normal romberg, normal balance PSYCH: Normally interactive. Conversant. Not depressed or anxious appearing.  Calm demeanor.    Results for orders placed in visit on 05/31/12  POCT URINALYSIS DIPSTICK  Result Value Range   Color, UA yellow     Clarity, UA clear     Glucose, UA neg     Bilirubin, UA neg     Ketones, UA neg     Spec Grav, UA 1.020     Blood, UA neg     pH, UA 7.0     Protein, UA 100     Urobilinogen, UA 0.2     Nitrite, UA neg     Leukocytes, UA Negative    POCT UA - MICROSCOPIC ONLY      Result Value Range   WBC, Ur, HPF, POC 0-1     RBC, urine, microscopic neg     Bacteria, U Microscopic neg     Mucus, UA neg     Epithelial cells, urine per micros 0-2     Crystals, Ur, HPF, POC neg     Casts, Ur, LPF, POC neg     Yeast, UA neg    POCT URINE PREGNANCY      Result Value Range   Preg Test, Ur Negative    POCT CBC      Result Value Range   WBC 7.3  4.6 - 10.2 K/uL   Lymph, poc 1.7  0.6 - 3.4   POC LYMPH PERCENT 23.2  10 - 50 %L   MID (cbc) 0.5  0 - 0.9   POC MID % 6.9  0 - 12 %M   POC Granulocyte 5.1  2 - 6.9   Granulocyte percent 69.9  37 - 80 %G   RBC 4.02 (*) 4.04 - 5.48 M/uL   Hemoglobin 11.8 (*) 12.2 - 16.2 g/dL   HCT, POC 37.3 (*) 37.7 - 47.9 %   MCV 92.9  80 - 97 fL   MCH, POC 31.6 (*) 27 - 31.2 pg   MCHC 14.4 (*) 31.8 - 35.4 g/dL   RDW, POC 14.4     Platelet Count, POC 281  142 - 424 K/uL   MPV 7.2  0 - 99.8 fL  GLUCOSE, POCT (MANUAL RESULT ENTRY)      Result Value Range   POC Glucose 97  70 - 99 mg/dl  POCT WET PREP WITH KOH      Result Value Range   Trichomonas, UA Negative     Clue Cells Wet Prep HPF POC POSITIVE     Epithelial Wet Prep HPF POC 10-15     Yeast Wet Prep HPF POC NEGATIVE     Bacteria Wet Prep HPF POC LARGE     RBC Wet Prep HPF POC NEGATIVE     WBC Wet Prep HPF POC 0-3      KOH Prep POC Negative      [facility] reading (PRIMARY) by  Dr. Lorelei Pont.  Abdominal series: negative, non- obstructive bowel gas pattern   Assessment and Plan: Dysuria - Plan: POCT urinalysis dipstick, POCT UA - Microscopic Only, POCT urine pregnancy, POCT glucose (manual entry), DG Abd Acute W/Chest, metroNIDAZOLE (METROGEL VAGINAL) 0.75 % vaginal gel, Urine culture  Abdominal  pain, other specified site - Plan: POCT CBC, GC/chlamydia probe amp, genital, POCT Wet Prep with KOH, DG Abd Acute W/Chest  Lumbago  Other malaise and fatigue  Visual disturbance - Plan: POCT glucose (manual entry), Comprehensive metabolic panel, TSH  Vaginal pain - Plan: GC/chlamydia probe amp, genital, POCT Wet Prep with KOH  Catherin is here today with several complaints.  Explained that our evaluation Robyn far at clinic has not revealed any  significant pathology.  However, it is possible that she could have suffered a stroke giving her the blurred vision or off balance feeling, and she could have diverticulitis or other abdominal pathology causing her abdominal pain.  She does appear to have BV, which we will treat.  I have collected a genprobe and urine culture as well as CMP and TSH.  Otherwise I recommended that she proceed to the ER for further evaluation which may include scans of her abdomen and head.  She declined to do this for now, but will seek care at the ER if she gets any worse.  Otherwise I will follow- up with her results when availablef  Signed Lamar Blinks, MD  Called again today to check on her and go over her labs in more detail.  No answer- LMOM.  Asked her to please come in for a recheck of her urine and potassium. Reminded we will need to check her TSH in about one month

## 2012-05-31 NOTE — Patient Instructions (Addendum)
If you start to feel worse or change your mind please go to the ER.  Otherwise I will be in touch when I get the rest of your labs back.    You can use the gel medication for BV- you can start this in a few days if you prefer.

## 2012-06-01 ENCOUNTER — Emergency Department (HOSPITAL_BASED_OUTPATIENT_CLINIC_OR_DEPARTMENT_OTHER): Payer: 59

## 2012-06-01 ENCOUNTER — Emergency Department (HOSPITAL_BASED_OUTPATIENT_CLINIC_OR_DEPARTMENT_OTHER)
Admission: EM | Admit: 2012-06-01 | Discharge: 2012-06-01 | Disposition: A | Discharge status: Home / Self Care | Payer: 59 | Attending: Emergency Medicine | Admitting: Emergency Medicine

## 2012-06-01 ENCOUNTER — Encounter (HOSPITAL_BASED_OUTPATIENT_CLINIC_OR_DEPARTMENT_OTHER): Payer: Self-pay

## 2012-06-01 DIAGNOSIS — R200 Anesthesia of skin: Secondary | ICD-10-CM

## 2012-06-01 DIAGNOSIS — Z8639 Personal history of other endocrine, nutritional and metabolic disease: Secondary | ICD-10-CM | POA: Insufficient documentation

## 2012-06-01 DIAGNOSIS — F411 Generalized anxiety disorder: Secondary | ICD-10-CM | POA: Insufficient documentation

## 2012-06-01 DIAGNOSIS — Z87891 Personal history of nicotine dependence: Secondary | ICD-10-CM | POA: Insufficient documentation

## 2012-06-01 DIAGNOSIS — Z8669 Personal history of other diseases of the nervous system and sense organs: Secondary | ICD-10-CM | POA: Insufficient documentation

## 2012-06-01 DIAGNOSIS — Z79899 Other long term (current) drug therapy: Secondary | ICD-10-CM | POA: Insufficient documentation

## 2012-06-01 DIAGNOSIS — Z87442 Personal history of urinary calculi: Secondary | ICD-10-CM | POA: Insufficient documentation

## 2012-06-01 DIAGNOSIS — Z8679 Personal history of other diseases of the circulatory system: Secondary | ICD-10-CM | POA: Insufficient documentation

## 2012-06-01 DIAGNOSIS — Z8719 Personal history of other diseases of the digestive system: Secondary | ICD-10-CM | POA: Insufficient documentation

## 2012-06-01 DIAGNOSIS — Z3202 Encounter for pregnancy test, result negative: Secondary | ICD-10-CM | POA: Insufficient documentation

## 2012-06-01 DIAGNOSIS — R51 Headache: Secondary | ICD-10-CM | POA: Insufficient documentation

## 2012-06-01 DIAGNOSIS — Z862 Personal history of diseases of the blood and blood-forming organs and certain disorders involving the immune mechanism: Secondary | ICD-10-CM | POA: Insufficient documentation

## 2012-06-01 DIAGNOSIS — H538 Other visual disturbances: Secondary | ICD-10-CM | POA: Insufficient documentation

## 2012-06-01 DIAGNOSIS — Z8619 Personal history of other infectious and parasitic diseases: Secondary | ICD-10-CM | POA: Insufficient documentation

## 2012-06-01 DIAGNOSIS — R209 Unspecified disturbances of skin sensation: Secondary | ICD-10-CM | POA: Insufficient documentation

## 2012-06-01 DIAGNOSIS — F3289 Other specified depressive episodes: Secondary | ICD-10-CM | POA: Insufficient documentation

## 2012-06-01 DIAGNOSIS — E876 Hypokalemia: Secondary | ICD-10-CM | POA: Insufficient documentation

## 2012-06-01 DIAGNOSIS — F329 Major depressive disorder, single episode, unspecified: Secondary | ICD-10-CM | POA: Insufficient documentation

## 2012-06-01 DIAGNOSIS — I10 Essential (primary) hypertension: Secondary | ICD-10-CM | POA: Insufficient documentation

## 2012-06-01 DIAGNOSIS — Z8742 Personal history of other diseases of the female genital tract: Secondary | ICD-10-CM | POA: Insufficient documentation

## 2012-06-01 LAB — URINE MICROSCOPIC-ADD ON

## 2012-06-01 LAB — URINALYSIS, ROUTINE W REFLEX MICROSCOPIC
Hgb urine dipstick: NEGATIVE
Nitrite: NEGATIVE
Protein, ur: 100 mg/dL — AB
Specific Gravity, Urine: 1.014 (ref 1.005–1.030)
Urobilinogen, UA: 1 mg/dL (ref 0.0–1.0)

## 2012-06-01 LAB — CBC WITH DIFFERENTIAL/PLATELET
Basophils Absolute: 0 10*3/uL (ref 0.0–0.1)
Basophils Relative: 0 % (ref 0–1)
Eosinophils Absolute: 0.5 10*3/uL (ref 0.0–0.7)
Eosinophils Relative: 7 % — ABNORMAL HIGH (ref 0–5)
MCH: 30.9 pg (ref 26.0–34.0)
MCHC: 35.2 g/dL (ref 30.0–36.0)
MCV: 87.7 fL (ref 78.0–100.0)
Neutrophils Relative %: 60 % (ref 43–77)
Platelets: 295 10*3/uL (ref 150–400)
RDW: 13.2 % (ref 11.5–15.5)

## 2012-06-01 LAB — COMPREHENSIVE METABOLIC PANEL
AST: 13 U/L (ref 0–37)
BUN: 11 mg/dL (ref 6–23)
Calcium: 8.9 mg/dL (ref 8.4–10.5)
Chloride: 102 mEq/L (ref 96–112)
Creat: 1.14 mg/dL — ABNORMAL HIGH (ref 0.50–1.10)
Total Bilirubin: 0.6 mg/dL (ref 0.3–1.2)

## 2012-06-01 LAB — BASIC METABOLIC PANEL
Calcium: 9.6 mg/dL (ref 8.4–10.5)
GFR calc Af Amer: 59 mL/min — ABNORMAL LOW (ref >90)
GFR calc non Af Amer: 51 mL/min — ABNORMAL LOW (ref >90)
Glucose, Bld: 96 mg/dL (ref 70–99)
Potassium: 3.4 mEq/L — ABNORMAL LOW (ref 3.5–5.1)
Sodium: 140 mEq/L (ref 135–145)

## 2012-06-01 LAB — PREGNANCY, URINE: Preg Test, Ur: NEGATIVE

## 2012-06-01 MED ORDER — MORPHINE SULFATE 4 MG/ML IJ SOLN
4.0000 mg | Freq: Once | INTRAMUSCULAR | Status: AC
  Administered 2012-06-01: 4 mg via INTRAVENOUS
  Filled 2012-06-01: qty 1

## 2012-06-01 MED ORDER — ONDANSETRON HCL 4 MG/2ML IJ SOLN
4.0000 mg | Freq: Once | INTRAMUSCULAR | Status: AC
  Administered 2012-06-01: 4 mg via INTRAVENOUS
  Filled 2012-06-01: qty 2

## 2012-06-01 NOTE — ED Provider Notes (Signed)
  Medical screening examination/treatment/procedure(s) were performed by non-physician practitioner and as supervising physician I was immediately available for consultation/collaboration.    Johnna Acosta, MD 06/01/12 [phone]

## 2012-06-01 NOTE — ED Notes (Signed)
Patient requesting something for HA. States she has not taken anything today. Tamala Julian, NP notified.

## 2012-06-01 NOTE — ED Provider Notes (Signed)
History     CSN: [id number]  Arrival date & time 06/01/12  [id number]   First MD Initiated Contact with Patient 06/01/12 1858      Chief Complaint  Patient presents with  . Headache  . Numbness  . Blurred Vision    (Consider location/radiation/quality/duration/timing/severity/associated sxs/prior treatment) HPI Comments: Pt states that over the last 5 days she has had some numbness and tingling in her left arm and hand, intermittent blurred vision and feeling fatigued;pt states that she has not had and speech difficulty or confusion:pt states that she was seen by her pcp yesterday and she was told to come to the er to be evaluated for a stroke, but she decided to come in today after not feeling well continued at work:pt states that she has had these feelings previously and it was from low potassium  Patient is a 42 y.o. female presenting with headaches. The history is provided by the patient. No language interpreter was used.  Headache Pain location:  Generalized Quality:  Dull Radiates to:  Does not radiate Onset quality:  Gradual Timing:  Constant Progression:  Unchanged Chronicity:  New Similar to prior headaches: yes     Past Medical History  Diagnosis Date  . Hypertension   . Shortness of breath     ONLY WITH ANXIETY  . Anxiety     OCCAS PANIC ATTACKS  . Blood transfusion 1990'S  . Hypokalemia     PAST HX  . Abnormal Pap smear   . H/O varicella   . Pregnancy induced hypertension   . Depression 2010  . Weight loss 2010  . Increased BMI   . H/O dysmenorrhea   . Ovarian cyst 2011  . Fibroid 2011  . H/O: menorrhagia 2011  . BV (bacterial vaginosis)   . Perimenopausal symptoms 07/15/2010  . Sleep apnea     PT USES CPAP SOMETIMES - SETTING IS 3  . Kidney stones   . GERD (gastroesophageal reflux disease)     occasional  . Cough     nonproductive cough last 2 weeks  . Obesity   . Allergy   . Blood transfusion without reported diagnosis     Past Surgical History   Procedure Laterality Date  . Percutaneous nephrostomy around 1996  may 2013    right kidney  . Uterine ablation march 2010  march 2011  . C-sections x 2    . Nephrolithotomy  04/09/2011    Procedure: NEPHROLITHOTOMY PERCUTANEOUS;  Surgeon: Molli Hazard, MD;  Location: [facility];  Service: Urology;  Laterality: Right;      . Ureteroscopy  04/09/2011    Procedure: URETEROSCOPY;  Surgeon: Molli Hazard, MD;  Location: [facility];  Service: Urology;  Laterality: Right;  . Tubal ligation  1999  . Nephrolithotomy  10/15/2011    Procedure: NEPHROLITHOTOMY PERCUTANEOUS;  Surgeon: Molli Hazard, MD;  Location: [facility];  Service: Urology;  Laterality: Left;  . Removal of stones  10/15/2011    Procedure: REMOVAL OF STONES;  Surgeon: Molli Hazard, MD;  Location: [facility];  Service: Urology;  Laterality: Left;  . Cholecystectomy  1994  . Cesarean section  1993 and 1999    Family History  Problem Relation Age of Onset  . Diabetes Mother   . Hypertension Mother   . Hypertension Father   . Diabetes Father   . Kidney disease Father   . Diabetes Paternal Grandmother   . Diabetes Maternal Grandmother   . Heart disease Maternal Grandmother  History  Substance Use Topics  . Smoking status: Former Smoker -- 0.25 packs/day for 15 years  . Smokeless tobacco: Never Used  . Alcohol Use: No    OB History   Grav Para Term Preterm Abortions TAB SAB Ect Mult Living   2 2 2       2       Review of Systems  Constitutional: Negative.   Respiratory: Negative.  Negative for shortness of breath.   Cardiovascular: Negative.  Negative for chest pain.  Neurological: Positive for headaches.    Allergies  Sulfa antibiotics  Home Medications   Current Outpatient Rx  Name  Route  Sig  Dispense  Refill  . doxazosin (CARDURA) 8 MG tablet   Oral   Take 8 mg by mouth at bedtime.         . ALPRAZolam (XANAX) 0.5 MG tablet      TAKE ONE TABLET BY MOUTH EVERY DAY AT BEDTIME AS  NEEDED FOR SLEEP   30 tablet   1   . amLODipine (NORVASC) 10 MG tablet   Oral   Take 1 tablet (10 mg total) by mouth daily.   30 tablet   5   . carvedilol (COREG) 25 MG tablet   Oral   Take 1 tablet (25 mg total) by mouth 2 (two) times daily with a meal.   60 tablet   5   . cloNIDine (CATAPRES) 0.2 MG tablet   Oral   Take 1 tablet (0.2 mg total) by mouth 3 (three) times daily.   90 tablet   5   . ergocalciferol (VITAMIN D2) 50000 UNITS capsule      One tablet po once weekly x 12 weeks   12 capsule   0   . fexofenadine (ALLEGRA) 180 MG tablet   Oral   Take 180 mg by mouth daily as needed. For allergies         . metroNIDAZOLE (METROGEL VAGINAL) 0.75 % vaginal gel      Apply one applicator PV each night for 5 nights   70 g   0   . Multiple Vitamins-Minerals (MULTIVITAMIN WITH MINERALS) tablet   Oral   Take 1 tablet by mouth daily.         . potassium chloride SA (K-DUR,KLOR-CON) 20 MEQ tablet   Oral   Take 2 tablets (40 mEq total) by mouth 3 (three) times daily.   90 tablet   0     BP 172/105  Pulse 72  Temp(Src) 98.1 F (36.7 C) (Oral)  Resp 20  Ht 5\' 6"  (1.676 m)  Wt 284 lb (128.822 kg)  BMI 45.86 kg/m2  SpO2 95%  Physical Exam  Nursing note and vitals reviewed. Constitutional: She is oriented to person, place, and time. She appears well-developed and well-nourished.  HENT:  Head: Normocephalic and atraumatic.  Eyes: Conjunctivae and EOM are normal. Pupils are equal, round, and reactive to light. Right eye exhibits no nystagmus. Left eye exhibits no nystagmus.  Neck: Normal range of motion. Neck supple.  Cardiovascular: Normal rate and regular rhythm.   Pulmonary/Chest: Effort normal and breath sounds normal.  Musculoskeletal: Normal range of motion.  Neurological: She is alert and oriented to person, place, and time. Coordination normal.  Skin: Skin is warm and dry.  Psychiatric: She has a normal mood and affect.    ED Course   Procedures (including critical care time)  Labs Reviewed  CBC WITH DIFFERENTIAL - Abnormal; Notable for the following:  HCT 35.5 (*)    Eosinophils Relative 7 (*)    All other components within normal limits  BASIC METABOLIC PANEL - Abnormal; Notable for the following:    Potassium 3.4 (*)    Creatinine, Ser 1.30 (*)    GFR calc non Af Amer 51 (*)    GFR calc Af Amer 59 (*)    All other components within normal limits  URINALYSIS, ROUTINE W REFLEX MICROSCOPIC - Abnormal; Notable for the following:    APPearance CLOUDY (*)    Protein, ur 100 (*)    All other components within normal limits  PREGNANCY, URINE  URINE MICROSCOPIC-ADD ON  POCT I-STAT TROPONIN I   Dg Chest 2 View  06/01/2012   *RADIOLOGY REPORT*  Clinical Data: Weakness, headache, blurred vision  CHEST - 2 VIEW  Comparison: 05/31/2012  Findings: Stable heart size and vascularity.  Minimal streaky basilar densities, suspect atelectasis, worse in the right lower lobe.  No CHF or definite pneumonia.  No enlarging effusion or pneumothorax.  Trachea is midline.  No osseous abnormality.  Slight thoracic scoliosis.  IMPRESSION: Basilar atelectasis.  No significant interval change.   Original Report Authenticated By: Jerilynn Mages. Annamaria Boots, M.D.   Ct Head Wo Contrast  06/01/2012   *RADIOLOGY REPORT*  Clinical Data: The right arm numbness.  Blurred vision.  CT HEAD WITHOUT CONTRAST  Technique:  Contiguous axial images were obtained from the base of the skull through the vertex without contrast.  Comparison: Head CT 08/19/2010  Findings: No acute intracranial hemorrhage.  No focal mass lesion. No CT evidence of acute infarction.   No midline shift or mass effect.  No hydrocephalus.  Basilar cisterns are patent.  There is a calcification in the left occipital lobe measuring 4 mm which is unchanged from prior.  This is likely related to remote infection or inflammation.  IMPRESSION:  1.  No acute intracranial findings. 2.  Stable calcification in  left  occipital lobe is likely dystrophic.   Original Report Authenticated By: Suzy Bouchard, M.D.   Dg Abd Acute W/chest  06/01/2012   *RADIOLOGY REPORT*  Clinical Data: Abdominal pain.  Dysuria.  ACUTE ABDOMEN SERIES (ABDOMEN 2 VIEW & CHEST 1 VIEW)  Comparison: 03/11/2012  Findings: Surgical clips seen from prior cholecystectomy.  The bowel gas pattern is normal.  Left pelvic phlebolith noted.  No radiopaque calculi identified.  Heart size is normal.  Both lungs are clear.  IMPRESSION: No acute findings.   Original Report Authenticated By: Earle Gell, M.D.   Date: 06/01/2012  Rate: 61  Rhythm: normal sinus rhythm  QRS Axis: normal  Intervals: QT prolonged  ST/T Wave abnormalities: normal  Conduction Disutrbances:none  Narrative Interpretation:   Old EKG Reviewed: changes noted     1. Headache   2. Blurred vision   3. Numbness and tingling       MDM  No sign of cva:pt is feeling better after the morphine:symptoms have resolved:possibly complex migraines:pt is okay to follow up with neuorology       Glendell Docker, NP 06/01/12 2054

## 2012-06-01 NOTE — ED Notes (Signed)
Pt reports numbness in right arm and hand that started Friday.  She developed headache and blurred vision that started Saturday evening.  She was evaluated at Urgent Care yesterday, had labs collected and was asked to go to ED for evaluation of stroke.

## 2012-06-01 NOTE — ED Notes (Signed)
Tamala Julian, NP at bedside.

## 2012-06-02 ENCOUNTER — Encounter: Payer: Self-pay | Admitting: Family Medicine

## 2012-06-02 ENCOUNTER — Telehealth: Payer: Self-pay | Admitting: Family Medicine

## 2012-06-02 LAB — GC/CHLAMYDIA PROBE AMP
CT Probe RNA: NEGATIVE
GC Probe RNA: NEGATIVE

## 2012-06-02 LAB — URINE CULTURE: Organism ID, Bacteria: NO GROWTH

## 2012-06-02 NOTE — Telephone Encounter (Signed)
Please disregard letter written earlier today stating that labs are ok except for anemia- this is in error.  Called pt and LMOM- we need to discus her thyroid level.  It looks like she might have been on tx in the past.  Asked her to please call us back.

## 2012-06-03 ENCOUNTER — Telehealth: Payer: Self-pay | Admitting: Family Medicine

## 2012-06-03 DIAGNOSIS — E039 Hypothyroidism, unspecified: Secondary | ICD-10-CM

## 2012-06-03 MED ORDER — LEVOTHYROXINE SODIUM 25 MCG PO TABS: 25.0000 ug | ORAL_TABLET | Freq: Every day | ORAL | Status: DC

## 2012-06-03 NOTE — Telephone Encounter (Signed)
Called her back.  She did end up going to the ER on 5/28 and had a full evaluation- everything looked ok and she was diagnosed with a likely migraine HA.    Her TSH came back at 41.  Will start her on synthroid at 25 mcg, and plan to recheck a TSH in 4 weeks.  This diagnosis makes a lot of sense to her, as she has noted cold intolerance and fatigue.

## 2012-06-03 NOTE — Telephone Encounter (Signed)
Patient returned call. Notified of your message regarding thyroid. She has never been on thyroid medication and has never been aware of problem with her thyroid. Can send medication to [facility] [address]. She also would like for you to give her a call to discuss. Best number til 5:30 is [phone]

## 2012-06-08 ENCOUNTER — Encounter: Payer: Self-pay | Admitting: Family Medicine

## 2012-07-19 ENCOUNTER — Telehealth: Payer: Self-pay

## 2012-07-19 ENCOUNTER — Other Ambulatory Visit: Payer: Self-pay | Admitting: Family Medicine

## 2012-07-19 DIAGNOSIS — G4730 Sleep apnea, unspecified: Secondary | ICD-10-CM

## 2012-07-19 NOTE — Telephone Encounter (Signed)
PT HAS HAD A CPAP MACHINE IN THE PAST AND IS IN NEED OF A NEW ONE. PT WAS ADVISED SHE WOULD NEED A NEW SLEEP STUDY, THEREFORE PT IS REQUESTING A REFERRAL FOR A SLEEP STUDY

## 2012-07-19 NOTE — Telephone Encounter (Signed)
Pended please advise.  

## 2012-07-20 NOTE — Telephone Encounter (Signed)
She also asked about the Alprazolam, advised this has been sent

## 2012-07-20 NOTE — Telephone Encounter (Signed)
I have signed the order for new sleep study.

## 2012-07-20 NOTE — Telephone Encounter (Signed)
Patient advised.

## 2012-07-20 NOTE — Telephone Encounter (Signed)
Alprazolam refill called to pt's pharmacy.

## 2012-07-25 ENCOUNTER — Encounter (HOSPITAL_BASED_OUTPATIENT_CLINIC_OR_DEPARTMENT_OTHER): Payer: Self-pay | Admitting: *Deleted

## 2012-07-25 ENCOUNTER — Emergency Department (HOSPITAL_BASED_OUTPATIENT_CLINIC_OR_DEPARTMENT_OTHER)
Admission: EM | Admit: 2012-07-25 | Discharge: 2012-07-25 | Disposition: A | Discharge status: Home / Self Care | Payer: 59 | Attending: Emergency Medicine | Admitting: Emergency Medicine

## 2012-07-25 ENCOUNTER — Emergency Department (HOSPITAL_BASED_OUTPATIENT_CLINIC_OR_DEPARTMENT_OTHER): Payer: 59

## 2012-07-25 DIAGNOSIS — F3289 Other specified depressive episodes: Secondary | ICD-10-CM | POA: Insufficient documentation

## 2012-07-25 DIAGNOSIS — Z8639 Personal history of other endocrine, nutritional and metabolic disease: Secondary | ICD-10-CM | POA: Insufficient documentation

## 2012-07-25 DIAGNOSIS — R0789 Other chest pain: Secondary | ICD-10-CM

## 2012-07-25 DIAGNOSIS — I10 Essential (primary) hypertension: Secondary | ICD-10-CM | POA: Insufficient documentation

## 2012-07-25 DIAGNOSIS — F411 Generalized anxiety disorder: Secondary | ICD-10-CM | POA: Insufficient documentation

## 2012-07-25 DIAGNOSIS — R05 Cough: Secondary | ICD-10-CM | POA: Insufficient documentation

## 2012-07-25 DIAGNOSIS — Z87891 Personal history of nicotine dependence: Secondary | ICD-10-CM | POA: Insufficient documentation

## 2012-07-25 DIAGNOSIS — Z8719 Personal history of other diseases of the digestive system: Secondary | ICD-10-CM | POA: Insufficient documentation

## 2012-07-25 DIAGNOSIS — E669 Obesity, unspecified: Secondary | ICD-10-CM | POA: Insufficient documentation

## 2012-07-25 DIAGNOSIS — Z79899 Other long term (current) drug therapy: Secondary | ICD-10-CM | POA: Insufficient documentation

## 2012-07-25 DIAGNOSIS — Z862 Personal history of diseases of the blood and blood-forming organs and certain disorders involving the immune mechanism: Secondary | ICD-10-CM | POA: Insufficient documentation

## 2012-07-25 DIAGNOSIS — Z87442 Personal history of urinary calculi: Secondary | ICD-10-CM | POA: Insufficient documentation

## 2012-07-25 DIAGNOSIS — Z8742 Personal history of other diseases of the female genital tract: Secondary | ICD-10-CM | POA: Insufficient documentation

## 2012-07-25 DIAGNOSIS — K219 Gastro-esophageal reflux disease without esophagitis: Secondary | ICD-10-CM | POA: Insufficient documentation

## 2012-07-25 DIAGNOSIS — R059 Cough, unspecified: Secondary | ICD-10-CM | POA: Insufficient documentation

## 2012-07-25 DIAGNOSIS — R0602 Shortness of breath: Secondary | ICD-10-CM | POA: Insufficient documentation

## 2012-07-25 DIAGNOSIS — Z8619 Personal history of other infectious and parasitic diseases: Secondary | ICD-10-CM | POA: Insufficient documentation

## 2012-07-25 DIAGNOSIS — F329 Major depressive disorder, single episode, unspecified: Secondary | ICD-10-CM | POA: Insufficient documentation

## 2012-07-25 LAB — COMPREHENSIVE METABOLIC PANEL
CO2: 28 mEq/L (ref 19–32)
Calcium: 10.1 mg/dL (ref 8.4–10.5)
Creatinine, Ser: 1.2 mg/dL — ABNORMAL HIGH (ref 0.50–1.10)
GFR calc Af Amer: 64 mL/min — ABNORMAL LOW (ref >90)
GFR calc non Af Amer: 55 mL/min — ABNORMAL LOW (ref >90)
Glucose, Bld: 98 mg/dL (ref 70–99)

## 2012-07-25 LAB — TROPONIN I
Troponin I: <0.3 ng/mL (ref <0.30)
Troponin I: <0.3 ng/mL (ref <0.30)

## 2012-07-25 LAB — CBC
MCH: 29.8 pg (ref 26.0–34.0)
MCHC: 33.9 g/dL (ref 30.0–36.0)
Platelets: 289 10*3/uL (ref 150–400)
RBC: 4.23 MIL/uL (ref 3.87–5.11)

## 2012-07-25 LAB — CK TOTAL AND CKMB (NOT AT ARMC): Total CK: 153 U/L (ref 7–177)

## 2012-07-25 LAB — D-DIMER, QUANTITATIVE: D-Dimer, Quant: 0.43 ug/mL-FEU (ref 0.00–0.48)

## 2012-07-25 MED ORDER — GI COCKTAIL ~~LOC~~
30.0000 mL | Freq: Once | ORAL | Status: AC
  Administered 2012-07-25: 30 mL via ORAL
  Filled 2012-07-25: qty 30

## 2012-07-25 MED ORDER — RANITIDINE HCL 150 MG PO TABS: 150.0000 mg | ORAL_TABLET | Freq: Two times a day (BID) | ORAL | Status: DC

## 2012-07-25 NOTE — ED Notes (Signed)
Patient transported to X-ray via stretcher 

## 2012-07-25 NOTE — ED Notes (Signed)
Patient woke up this am with left sided chest pain that radiates to her back.  Patient also is c/o nausea and shortness of breath. Patient also c/o weakness to he left leg ambulates w/o difficulty

## 2012-07-25 NOTE — ED Provider Notes (Signed)
History    This chart was scribed for Robyn Dessert, MD by Roxan Diesel, ED scribe.  This patient was seen in room MH01/MH01 and the patient's care was started at 3:01 PM.  CSN: [id number]  Arrival date & time 07/25/12  1311    Chief Complaint  Patient presents with  . Chest Pain    The history is provided by the patient. No language interpreter was used.    HPI Comments: Robyn [name] is a 42 y.o. female with h/o HTN and GERD who presents to the Emergency Department complaining of constant, waxing-and-waning left-sided CP that began this morning with accompanying SOB.  Pt states that she felt fine yesterday and her symptoms began this morning after getting out of bed when she got into the shower.  Pain is localized to the left chest extending from the shoulder to beneath the breast, and radiates to her upper back.  It is characterized as sharp and achy.  It exacerbated by standing up, deep breathing and coughing, and is relieved somewhat by lying down.  She has not attempted to treat pain pta.  Pt also presents with a BP of 188/117 on admission and notes that her her normal BP is in the range of "170-180"/"90 and above."  Pt also notes decreased appetite and states she has not eaten today.  In addition she states that she coughed a small amount on admission to ED but denies other cough.   She denies abdominal pain, dysuria, or any other associated symptoms.  Pt also notes intermittent left leg pain that she has had "for a while" and some swelling to the leg "a while ago," but denies any leg swelling or pain since CP began.  She denies recent h/o surgery or long travel.  Pt reports compliance with all medications and notes she took one 325 mg aspirin today.  She does note a recent change in her HTN medication (2 weeks ago).   Pt admits to family h/o heart attacks (father and grandmother).  Her father had a MI in his early 60s.  Pt is a current smoker.  She denies h/o asthma. She denies  h/o DVT.     Past Medical History  Diagnosis Date  . Hypertension   . Shortness of breath     ONLY WITH ANXIETY  . Anxiety     OCCAS PANIC ATTACKS  . Blood transfusion 1990'S  . Hypokalemia     PAST HX  . Abnormal Pap smear   . H/O varicella   . Pregnancy induced hypertension   . Depression 2010  . Weight loss 2010  . Increased BMI   . H/O dysmenorrhea   . Ovarian cyst 2011  . Fibroid 2011  . H/O: menorrhagia 2011  . BV (bacterial vaginosis)   . Perimenopausal symptoms 07/15/2010  . Sleep apnea     PT USES CPAP SOMETIMES - SETTING IS 3  . Kidney stones   . GERD (gastroesophageal reflux disease)     occasional  . Cough     nonproductive cough last 2 weeks  . Obesity   . Allergy   . Blood transfusion without reported diagnosis     Past Surgical History  Procedure Laterality Date  . Percutaneous nephrostomy around 1996  may 2013    right kidney  . Uterine ablation march 2010  march 2011  . C-sections x 2    . Nephrolithotomy  04/09/2011    Procedure: NEPHROLITHOTOMY PERCUTANEOUS;  Surgeon: Molli Hazard, MD;  Location: [facility];  Service: Urology;  Laterality: Right;      . Ureteroscopy  04/09/2011    Procedure: URETEROSCOPY;  Surgeon: Molli Hazard, MD;  Location: [facility];  Service: Urology;  Laterality: Right;  . Tubal ligation  1999  . Nephrolithotomy  10/15/2011    Procedure: NEPHROLITHOTOMY PERCUTANEOUS;  Surgeon: Molli Hazard, MD;  Location: [facility];  Service: Urology;  Laterality: Left;  . Removal of stones  10/15/2011    Procedure: REMOVAL OF STONES;  Surgeon: Molli Hazard, MD;  Location: [facility];  Service: Urology;  Laterality: Left;  . Cholecystectomy  1994  . Cesarean section  1993 and 1999    Family History  Problem Relation Age of Onset  . Diabetes Mother   . Hypertension Mother   . Hypertension Father   . Diabetes Father   . Kidney disease Father   . Diabetes Paternal Grandmother   . Diabetes Maternal Grandmother   .  Heart disease Maternal Grandmother     History  Substance Use Topics  . Smoking status: Former Smoker -- 0.25 packs/day for 15 years  . Smokeless tobacco: Never Used  . Alcohol Use: No    OB History   Grav Para Term Preterm Abortions TAB SAB Ect Mult Living   2 2 2       2       Review of Systems A complete 10 system review of systems was obtained and all systems are negative except as noted in the HPI and PMH.    Allergies  Sulfa antibiotics  Home Medications   Current Outpatient Rx  Name  Route  Sig  Dispense  Refill  . ALPRAZolam (XANAX) 0.5 MG tablet      TAKE ONE TABLET BY MOUTH AT BEDTIME AS NEEDED FOR SLEEP   30 tablet   1   . amLODipine (NORVASC) 10 MG tablet   Oral   Take 1 tablet (10 mg total) by mouth daily.   30 tablet   5   . carvedilol (COREG) 25 MG tablet   Oral   Take 1 tablet (25 mg total) by mouth 2 (two) times daily with a meal.   60 tablet   5   . cloNIDine (CATAPRES) 0.2 MG tablet   Oral   Take 1 tablet (0.2 mg total) by mouth 3 (three) times daily.   90 tablet   5   . doxazosin (CARDURA) 8 MG tablet   Oral   Take 8 mg by mouth at bedtime.         . ergocalciferol (VITAMIN D2) 50000 UNITS capsule      One tablet po once weekly x 12 weeks   12 capsule   0   . fexofenadine (ALLEGRA) 180 MG tablet   Oral   Take 180 mg by mouth daily as needed. For allergies         . levothyroxine (SYNTHROID, LEVOTHROID) 25 MCG tablet   Oral   Take 1 tablet (25 mcg total) by mouth daily before breakfast.   30 tablet   3   . metroNIDAZOLE (METROGEL VAGINAL) 0.75 % vaginal gel      Apply one applicator PV each night for 5 nights   70 g   0   . Multiple Vitamins-Minerals (MULTIVITAMIN WITH MINERALS) tablet   Oral   Take 1 tablet by mouth daily.         . potassium chloride SA (K-DUR,KLOR-CON) 20 MEQ tablet   Oral  Take 2 tablets (40 mEq total) by mouth 3 (three) times daily.   90 tablet   0    BP 188/117  Pulse 64   Temp(Src) 98.3 F (36.8 C) (Oral)  Ht 5\' 6"  (1.676 m)  Wt 280 lb (127.007 kg)  BMI 45.21 kg/m2  SpO2 98%  Physical Exam  Nursing note and vitals reviewed. Constitutional: She is oriented to person, place, and time. She appears well-developed and well-nourished. No distress.  HENT:  Head: Normocephalic and atraumatic.  Eyes: EOM are normal.  Neck: Neck supple. No tracheal deviation present.  Cardiovascular: Normal rate.   2+ radial pulses and DP pulses bilaterally No lower extremity edema  Pulmonary/Chest: Effort normal. No respiratory distress. She exhibits tenderness (Reproducible tenderness in left lower chest wall).  Abdominal: Soft. Bowel sounds are normal. There is no tenderness. There is no rebound and no guarding.  Obese  Musculoskeletal: Normal range of motion. She exhibits no edema.  Neurological: She is alert and oriented to person, place, and time.  Skin: Skin is warm and dry.  Psychiatric: She has a normal mood and affect. Her behavior is normal.    ED Course  Procedures (including critical care time)  DIAGNOSTIC STUDIES: Oxygen Saturation is 98% on room air, normal by my interpretation.    COORDINATION OF CARE: 3:09 PM-Discussed treatment plan which includes cardiac work-up with pt at bedside and pt agreed to plan.     Labs Reviewed  COMPREHENSIVE METABOLIC PANEL - Abnormal; Notable for the following:    Creatinine, Ser 1.20 (*)    GFR calc non Af Amer 55 (*)    GFR calc Af Amer 64 (*)    All other components within normal limits  CBC  TROPONIN I  CK TOTAL AND CKMB  D-DIMER, QUANTITATIVE  TROPONIN I    Date: 07/25/2012  Rate: 68  Rhythm: normal sinus rhythm  QRS Axis: normal  Intervals: normal  ST/T Wave abnormalities: nonspecific T wave changes  Conduction Disutrbances:none  Narrative Interpretation:   Old EKG Reviewed: unchanged    Dg Chest 2 View  07/25/2012   *RADIOLOGY REPORT*  Clinical Data: Of breath and chest pain.  CHEST - 2 VIEW   Comparison: PA and lateral chest 06/01/2012.  Findings: Lungs are clear.  No pneumothorax or pleural fluid. Heart size is normal.  IMPRESSION: No acute disease.   Original Report Authenticated By: Orlean Patten, M.D.    1. Atypical chest pain   2. GERD (gastroesophageal reflux disease)     MDM   Pt with atypical story for CP that has been on going all day.  TIMI 0 and risk factors include HTN and family hx.  Low risk wells.  EKG unchanged from prior.  CXR, CBC, CMP, troponin (0,3 hours), and d-dimer wnl.  The patient's symptoms completely resolved after GI cocktail and on further investigation patient states she takes ibuprofen regularly which is most likely the cause of her symptoms today. Patient started on Zantac and given instructions to followup with her PCP for ongoing treatment of her blood pressure which always seems to run high per the patient.  Patient given strict return precautions     I personally performed the services described in this documentation, which was scribed in my presence.  The recorded information has been reviewed and considered.    Robyn Dessert, MD 07/25/12 [phone]

## 2012-07-25 NOTE — ED Notes (Signed)
MD at bedside. 

## 2012-08-17 ENCOUNTER — Telehealth: Payer: Self-pay | Admitting: Neurology

## 2012-08-17 NOTE — Telephone Encounter (Signed)
. °  Dr. [name] is referring Robyn [name], 42 y.o. y/o female, for a reevaluation of sleep apnea.  Wt: 283 lbs. Ht: 64.5 in. BMI: 47.95  Diagnoses: Obstructive Sleep Apnea - dx'd several years ago ([facility] no longer has rec's will have to order. Excessive Daytime Sleepiness Morbid Obesity Fatigue Chronic Fatigue Witness Apnea Hypertension Chronic Kidney Disease Stage III Nephrolithiasis Hypokalemia  Medication List: Current Outpatient Prescriptions  Medication Sig Dispense Refill   ALPRAZolam (XANAX) 0.5 MG tablet TAKE ONE TABLET BY MOUTH AT BEDTIME AS NEEDED FOR SLEEP  30 tablet  1   amLODipine (NORVASC) 10 MG tablet Take 1 tablet (10 mg total) by mouth daily.  30 tablet  5   carvedilol (COREG) 25 MG tablet Take 1 tablet (25 mg total) by mouth 2 (two) times daily with a meal.  60 tablet  5   cloNIDine (CATAPRES) 0.2 MG tablet Take 1 tablet (0.2 mg total) by mouth 3 (three) times daily.  90 tablet  5   doxazosin (CARDURA) 8 MG tablet Take 8 mg by mouth at bedtime.       ergocalciferol (VITAMIN D2) 50000 UNITS capsule One tablet po once weekly x 12 weeks  12 capsule  0   fexofenadine (ALLEGRA) 180 MG tablet Take 180 mg by mouth daily as needed. For allergies       levothyroxine (SYNTHROID, LEVOTHROID) 25 MCG tablet Take 1 tablet (25 mcg total) by mouth daily before breakfast.  30 tablet  3   metroNIDAZOLE (METROGEL VAGINAL) 0.75 % vaginal gel Apply one applicator PV each night for 5 nights  70 g  0   Multiple Vitamins-Minerals (MULTIVITAMIN WITH MINERALS) tablet Take 1 tablet by mouth daily.       potassium chloride SA (K-DUR,KLOR-CON) 20 MEQ tablet Take 2 tablets (40 mEq total) by mouth 3 (three) times daily.  90 tablet  0   ranitidine (ZANTAC) 150 MG tablet Take 1 tablet (150 mg total) by mouth 2 (two) times daily.  28 tablet  0   No current facility-administered medications for this visit.    This patient presents to Dr. [name] with request for a new sleep study because her CPAP machine is broken.  The patient was diagnosed over 5 years ago.  Had sleep study done at [facility] who no longer have records of her sleep study on site but would have to be ordered from their off site storage facility.  The patient complains of increased daytime fatigue and excessive daytime sleepiness.  She endorses [facility] at 15.  Husband reports frequency in apneic events and loud snoring.  Pt wakes herself choking and gasping.   Insurance:  [employer] - Sleep study is covered at 100% - Prior authorization has been approved for the attended study.

## 2012-08-18 ENCOUNTER — Other Ambulatory Visit: Payer: Self-pay | Admitting: Neurology

## 2012-08-18 DIAGNOSIS — E662 Morbid (severe) obesity with alveolar hypoventilation: Secondary | ICD-10-CM

## 2012-08-18 NOTE — Telephone Encounter (Signed)
This patient qualifies for attended sleep study based on clinical history.  Patient will need a SPLIT at AHI 10 and  3% scoring. Please advise patient to n bring her own sleep aid if she takes any.   CO2 necessary.

## 2012-08-27 ENCOUNTER — Other Ambulatory Visit: Payer: Self-pay | Admitting: Family Medicine

## 2012-08-29 NOTE — Telephone Encounter (Signed)
Needs OV.  

## 2012-09-10 ENCOUNTER — Ambulatory Visit (INDEPENDENT_AMBULATORY_CARE_PROVIDER_SITE_OTHER): Payer: 59 | Admitting: Neurology

## 2012-09-10 VITALS — BP 177/110

## 2012-09-10 DIAGNOSIS — G4733 Obstructive sleep apnea (adult) (pediatric): Secondary | ICD-10-CM

## 2012-09-10 DIAGNOSIS — E662 Morbid (severe) obesity with alveolar hypoventilation: Secondary | ICD-10-CM

## 2012-09-24 ENCOUNTER — Other Ambulatory Visit: Payer: Self-pay | Admitting: Family Medicine

## 2012-09-25 NOTE — Telephone Encounter (Signed)
Dr Leward Quan, do you want to RF or have pt RTC?

## 2012-09-26 NOTE — Telephone Encounter (Signed)
Alprazolam refilled x 1 with message to pharmacy to inform pt that an OV needed for additional refils.

## 2012-09-27 ENCOUNTER — Telehealth: Payer: Self-pay

## 2012-09-27 NOTE — Telephone Encounter (Signed)
Pt was calling to find out about her sleep study results from Dr. Leward Quan.  She said its been approximately two weeks and she hasn't heard anything.  She also said she called about this last Thursday or Friday and has not heard anything.  I do not see a documented call about this.  Please call her if at all possible today:  Work # is [phone].  After 5 p.m. She can be reached on her cell at [phone].

## 2012-09-27 NOTE — Telephone Encounter (Signed)
This was done at [facility], normally they call patients with results. I will call them to call patient, [name]

## 2012-09-28 NOTE — Telephone Encounter (Signed)
[facility].

## 2012-09-29 ENCOUNTER — Telehealth: Payer: Self-pay | Admitting: Neurology

## 2012-09-29 ENCOUNTER — Telehealth: Payer: Self-pay | Admitting: Radiology

## 2012-09-29 DIAGNOSIS — G4733 Obstructive sleep apnea (adult) (pediatric): Secondary | ICD-10-CM

## 2012-09-29 DIAGNOSIS — G4722 Circadian rhythm sleep disorder, advanced sleep phase type: Secondary | ICD-10-CM

## 2012-09-29 NOTE — Telephone Encounter (Signed)
Spoke to patient again, she states [facility] has advised her we will call her with the results. They are sending to you. Have you gotten this

## 2012-09-29 NOTE — Telephone Encounter (Signed)
I called and left a message for the patient to callback to the office to speak with me Ivin Booty) concerning her sleep study results.

## 2012-09-29 NOTE — Telephone Encounter (Signed)
[phone] is the number for [facility]. I have called them to see if they can contact patient with the results. I called patient to let her know I have called the sleep center so they will hopefully be contacting her soon.

## 2012-09-29 NOTE — Telephone Encounter (Signed)
I called and spoke with the patient about her sleep study results. I informed the patient that Dr. Brett Fairy would like for her to start CPAP therapy. Patient stated she would like for her order to be sent to [facility].

## 2012-09-30 ENCOUNTER — Encounter: Payer: Self-pay | Admitting: Family Medicine

## 2012-09-30 ENCOUNTER — Telehealth: Payer: Self-pay | Admitting: Radiology

## 2012-09-30 ENCOUNTER — Encounter: Payer: Self-pay | Admitting: *Deleted

## 2012-09-30 DIAGNOSIS — G4730 Sleep apnea, unspecified: Secondary | ICD-10-CM | POA: Insufficient documentation

## 2012-09-30 NOTE — Telephone Encounter (Signed)
No, they have ordered all her supplies, but [facility] wants [place] to call her with the results, I will call to give her the results of the study. Thank you

## 2012-09-30 NOTE — Telephone Encounter (Signed)
Called patient about the sleep study/ left detailed message. If she has questions she is instructed to call.

## 2012-09-30 NOTE — Telephone Encounter (Signed)
I just received it today. She has Sleep Apnea and CPAP is advised.Good sleeping position, no sedative or hypnotic medications, no alcohol or tobacco use, lose weight and no driving or operating machinery while drowsy. She will be contacted regarding a follow-up appt at [facility]. Are we supposed to order the CPAP or will that be done when she goes back to her f/u appt at [facility]?

## 2012-10-03 ENCOUNTER — Other Ambulatory Visit: Payer: Self-pay | Admitting: Family Medicine

## 2012-11-05 ENCOUNTER — Other Ambulatory Visit: Payer: Self-pay | Admitting: Physician Assistant

## 2012-11-09 ENCOUNTER — Telehealth: Payer: Self-pay

## 2012-11-09 NOTE — Telephone Encounter (Signed)
Pt has scheduled a f/u appt with dr. Leward Quan on 11/13 for a med refill but would like to know if she can have a few alprazolam pills to last her until then. Please call  [phone] Pt uses the neighborhood [facility] on [address].

## 2012-11-11 ENCOUNTER — Other Ambulatory Visit: Payer: Self-pay

## 2012-11-16 MED ORDER — ALPRAZOLAM 0.5 MG PO TABS: ORAL_TABLET | ORAL | Status: DC

## 2012-11-16 NOTE — Telephone Encounter (Signed)
I phoned Alprazolam 0.5 mg 1 tab at bedtime prn  #10 w/ no refills to pt's pharmacy.

## 2012-11-18 ENCOUNTER — Encounter: Payer: Self-pay | Admitting: Family Medicine

## 2012-11-18 ENCOUNTER — Ambulatory Visit (INDEPENDENT_AMBULATORY_CARE_PROVIDER_SITE_OTHER): Payer: 59 | Admitting: Family Medicine

## 2012-11-18 VITALS — BP 166/106 | HR 64 | Temp 98.2°F | Resp 16 | Ht 64.5 in | Wt 283.0 lb

## 2012-11-18 DIAGNOSIS — F419 Anxiety disorder, unspecified: Secondary | ICD-10-CM

## 2012-11-18 DIAGNOSIS — E669 Obesity, unspecified: Secondary | ICD-10-CM

## 2012-11-18 DIAGNOSIS — F411 Generalized anxiety disorder: Secondary | ICD-10-CM

## 2012-11-18 DIAGNOSIS — G44209 Tension-type headache, unspecified, not intractable: Secondary | ICD-10-CM

## 2012-11-18 DIAGNOSIS — E039 Hypothyroidism, unspecified: Secondary | ICD-10-CM

## 2012-11-18 LAB — T4, FREE: Free T4: 1.34 ng/dL (ref 0.80–1.80)

## 2012-11-18 LAB — TSH: TSH: 3.967 u[IU]/mL (ref 0.350–4.500)

## 2012-11-18 LAB — T3, FREE: T3, Free: 3.3 pg/mL (ref 2.3–4.2)

## 2012-11-18 MED ORDER — LEVOTHYROXINE SODIUM 25 MCG PO TABS: 25.0000 ug | ORAL_TABLET | Freq: Every day | ORAL | Status: DC

## 2012-11-18 MED ORDER — ALPRAZOLAM 0.5 MG PO TABS: ORAL_TABLET | ORAL | Status: DC

## 2012-11-18 MED ORDER — ONDANSETRON HCL 4 MG PO TABS: 4.0000 mg | ORAL_TABLET | Freq: Three times a day (TID) | ORAL | Status: DC | PRN

## 2012-11-18 NOTE — Progress Notes (Signed)
Subjective:    Patient ID: Robyn [name], female    DOB: 1970-08-27, 42 y.o.   MRN: [id number]  HPI  This 42 y.o. AA female is here for follow-up thyroid dysfunction and anxiety disorder. She feels better and is compliant w/ medication. She has significant work-related stress and does not sleep well, even w/ Alprazolam. She occasionally has to take 1/2 tab at work when she is stressed about meeting deadlines; she often works at home and on vacation. She also has responsibilities at home and is doing online college through [employer] to get her [employer] Degree. She seldom takes time to eat healthy meals away from her desk. She is concerned about her weight and overall health. Dr. Posey Pronto (nephrology) has adjusted her BP meds. She knows she needs to increase physical activity but finds it hard to make time for this.  Pt reports frontal/bitemporal HAs, relieved w/ OTC analgesic. HAs accompanied by nausea and infrequent vomiting. Pt has OSA and getting CPAP soon; she plans to use it as directed.   Review of Systems  Constitutional: Positive for fatigue. Negative for diaphoresis, activity change, appetite change and unexpected weight change.  HENT: Negative for congestion, dental problem, nosebleeds, sinus pressure and trouble swallowing.   Eyes: Negative.   Respiratory: Positive for apnea. Negative for cough, chest tightness and shortness of breath.   Cardiovascular: Negative for chest pain and palpitations.  Endocrine: Negative.   Musculoskeletal: Negative.   Neurological: Positive for headaches. Negative for dizziness, syncope, speech difficulty, weakness and numbness.  Psychiatric/Behavioral: Positive for sleep disturbance. Negative for behavioral problems, dysphoric mood, decreased concentration and agitation. The patient is nervous/anxious.        Objective:   Physical Exam  Nursing note and vitals reviewed. Constitutional: She is oriented to person, place, and time. She appears  well-developed and well-nourished. No distress.  HENT:  Head: Normocephalic and atraumatic.  Right Ear: External ear normal.  Left Ear: External ear normal.  Nose: Nose normal.  Mouth/Throat: Oropharynx is clear and moist. No oropharyngeal exudate.  Eyes: Conjunctivae and EOM are normal. Pupils are equal, round, and reactive to light. No scleral icterus.  Neck: Normal range of motion. Neck supple. No thyromegaly present.  Cardiovascular: Normal rate and regular rhythm.   Pulmonary/Chest: Effort normal. No respiratory distress.  Musculoskeletal: Normal range of motion. She exhibits no edema.  Lymphadenopathy:    She has no cervical adenopathy.  Neurological: She is alert and oriented to person, place, and time. No cranial nerve deficit or sensory deficit. She exhibits normal muscle tone. Coordination and gait normal.  Skin: Skin is warm and dry. She is not diaphoretic. No erythema.  Psychiatric: She has a normal mood and affect. Her behavior is normal. Judgment and thought content normal.      Assessment & Plan:  Unspecified hypothyroidism - Continue current dose of medication; suspect pt's dose needs to be increased.    Plan: T3, free, T4, free, TSH  Anxiety disorder- Stress reduction techniques discussed w/ pt; advised making time for healthy meals and commit to exercise at least 2 times a week.    Tension headache- Stress reduction and better self-care. Nightly CPAP use once apparatus obtained. May continue current medication; antiemetic prescribed.  Obesity- Weight loss plan and healthy nutrition as stated above.  Meds ordered this encounter  Medications  . lisinopril (PRINIVIL,ZESTRIL) 40 MG tablet    Sig: Take 40 mg by mouth daily.  Marland Kitchen spironolactone (ALDACTONE) 100 MG tablet    Sig: Take  100 mg by mouth daily.  Marland Kitchen ALPRAZolam (XANAX) 0.5 MG tablet    Sig: TAKE ONE TABLET BY MOUTH AT BEDTIME AS NEEDED FOR SLEEP    Dispense:  30 tablet    Refill:  1  . levothyroxine  (SYNTHROID, LEVOTHROID) 25 MCG tablet    Sig: Take 1 tablet (25 mcg total) by mouth daily before breakfast.    Dispense:  30 tablet    Refill:  3  . ondansetron (ZOFRAN) 4 MG tablet    Sig: Take 1 tablet (4 mg total) by mouth every 8 (eight) hours as needed for nausea or vomiting.    Dispense:  20 tablet    Refill:  0

## 2012-11-18 NOTE — Patient Instructions (Addendum)
Diabetes Meal Planning Guide The diabetes meal planning guide is a tool to help you plan your meals and snacks. It is important for people with diabetes to manage their blood glucose (sugar) levels. Choosing the right foods and the right amounts throughout your day will help control your blood glucose. Eating right can even help you improve your blood pressure and reach or maintain a healthy weight. CARBOHYDRATE COUNTING MADE EASY When you eat carbohydrates, they turn to sugar. This raises your blood glucose level. Counting carbohydrates can help you control this level so you feel better. When you plan your meals by counting carbohydrates, you can have more flexibility in what you eat and balance your medicine with your food intake. Carbohydrate counting simply means adding up the total amount of carbohydrate grams in your meals and snacks. Try to eat about the same amount at each meal. Foods with carbohydrates are listed below. Each portion below is 1 carbohydrate serving or 15 grams of carbohydrates. Ask your dietician how many grams of carbohydrates you should eat at each meal or snack. Grains and Starches  1 slice bread.   English muffin or hotdog/hamburger bun.   cup cold cereal (unsweetened).   cup cooked pasta or rice.   cup starchy vegetables (corn, potatoes, peas, beans, winter squash).  1 tortilla (6 inches).   bagel.  1 waffle or pancake (size of a CD).   cup cooked cereal.  4 to 6 small crackers. *Whole grain is recommended. Fruit  1 cup fresh unsweetened berries, melon, papaya, pineapple.  1 small fresh fruit.   banana or mango.   cup fruit juice (4 oz unsweetened).   cup canned fruit in natural juice or water.  2 tbs dried fruit.  12 to 15 grapes or cherries. Milk and Yogurt  1 cup fat-free or 1% milk.  1 cup soy milk.  6 oz light yogurt with sugar-free sweetener.  6 oz low-fat soy yogurt.  6 oz plain yogurt. Vegetables  1 cup raw or  cup  cooked is counted as 0 carbohydrates or a "free" food.  If you eat 3 or more servings at 1 meal, count them as 1 carbohydrate serving. Other Carbohydrates   oz chips or pretzels.   cup ice cream or frozen yogurt.   cup sherbet or sorbet.  2 inch square cake, no frosting.  1 tbs honey, sugar, jam, jelly, or syrup.  2 small cookies.  3 squares of graham crackers.  3 cups popcorn.  6 crackers.  1 cup broth-based soup.  Count 1 cup casserole or other mixed foods as 2 carbohydrate servings.  Foods with less than 20 calories in a serving may be counted as 0 carbohydrates or a "free" food. You may want to purchase a book or computer software that lists the carbohydrate gram counts of different foods. In addition, the nutrition facts panel on the labels of the foods you eat are a good source of this information. The label will tell you how big the serving size is and the total number of carbohydrate grams you will be eating per serving. Divide this number by 15 to obtain the number of carbohydrate servings in a portion. Remember, 1 carbohydrate serving equals 15 grams of carbohydrate. SERVING SIZES Measuring foods and serving sizes helps you make sure you are getting the right amount of food. The list below tells how big or small some common serving sizes are.  1 oz.........4 stacked dice.  3 oz.........Deck of cards.  1 tsp........Tip   of little finger.  1 tbs......Marland KitchenMarland KitchenThumb.  2 tbs.......Marland KitchenGolf ball.   cup......Marland KitchenHalf of a fist.  1 cup.......Marland KitchenA fist. SAMPLE DIABETES MEAL PLAN Below is a sample meal plan that includes foods from the grain and starches, dairy, vegetable, fruit, and meat groups. A dietician can individualize a meal plan to fit your calorie needs and tell you the number of servings needed from each food group. However, controlling the total amount of carbohydrates in your meal or snack is more important than making sure you include all of the food groups at every  meal. You may interchange carbohydrate containing foods (dairy, starches, and fruits). The meal plan below is an example of a 2000 calorie diet using carbohydrate counting. This meal plan has 17 carbohydrate servings. Breakfast  1 cup oatmeal (2 carb servings).   cup light yogurt (1 carb serving).  1 cup blueberries (1 carb serving).   cup almonds. Snack  1 large apple (2 carb servings).  1 low-fat string cheese stick. Lunch  Chicken breast salad.  1 cup spinach.   cup chopped tomatoes.  2 oz chicken breast, sliced.  2 tbs low-fat [place] dressing.  12 whole-wheat crackers (2 carb servings).  12 to 15 grapes (1 carb serving).  1 cup low-fat milk (1 carb serving). Snack  1 cup carrots.   cup hummus (1 carb serving). Dinner  3 oz broiled salmon.  1 cup brown rice (3 carb servings). Snack  1  cups steamed broccoli (1 carb serving) drizzled with 1 tsp olive oil and lemon juice.  1 cup light pudding (2 carb servings). DIABETES MEAL PLANNING WORKSHEET Your dietician can use this worksheet to help you decide how many servings of foods and what types of foods are right for you.  BREAKFAST Food Group and Servings / Carb Servings Grain/Starches __________________________________ Dairy __________________________________________ Vegetable ______________________________________ Fruit ___________________________________________ Meat __________________________________________ Fat ____________________________________________ LUNCH Food Group and Servings / Carb Servings Grain/Starches ___________________________________ Dairy ___________________________________________ Fruit ____________________________________________ Meat ___________________________________________ Fat _____________________________________________ Robyn [name] Food Group and Servings / Carb Servings Grain/Starches ___________________________________ Dairy  ___________________________________________ Fruit ____________________________________________ Meat ___________________________________________ Fat _____________________________________________ SNACKS Food Group and Servings / Carb Servings Grain/Starches ___________________________________ Dairy ___________________________________________ Vegetable _______________________________________ Fruit ____________________________________________ Meat ___________________________________________ Fat _____________________________________________ DAILY TOTALS Starches _________________________ Vegetable ________________________ Fruit ____________________________ Dairy ____________________________ Meat ____________________________ Fat ______________________________ Document Released: 09/19/2004 Document Revised: 03/17/2011 Document Reviewed: 07/31/2008 [employer] Patient Information 2014 [employer].   This is an example of meal planning; you can find other helpful information online at Dr. Irena Cords' web site or through the ADA (American Diabetes Association) web site.   Exercise to Lose Weight Exercise and a healthy diet may help you lose weight. Your doctor may suggest specific exercises. EXERCISE IDEAS AND TIPS  Choose low-cost things you enjoy doing, such as walking, bicycling, or exercising to workout videos.  Take stairs instead of the elevator.  Walk during your lunch break.  Park your car further away from work or school.  Go to a gym or an exercise class.  Start with 5 to 10 minutes of exercise each day. Build up to 30 minutes of exercise 4 to 6 days a week.  Wear shoes with good support and comfortable clothes.  Stretch before and after working out.  Work out until you breathe harder and your heart beats faster.  Drink extra water when you exercise.  Do not do so much that you hurt yourself, feel dizzy, or get very short of breath. Exercises that burn about 150  calories:  Running 1  miles  in 15 minutes.  Playing volleyball for 45 to 60 minutes.  Washing and waxing a car for 45 to 60 minutes.  Playing touch football for 45 minutes.  Walking 1  miles in 35 minutes.  Pushing a stroller 1  miles in 30 minutes.  Playing basketball for 30 minutes.  Raking leaves for 30 minutes.  Bicycling 5 miles in 30 minutes.  Walking 2 miles in 30 minutes.  Dancing for 30 minutes.  Shoveling snow for 15 minutes.  Swimming laps for 20 minutes.  Walking up stairs for 15 minutes.  Bicycling 4 miles in 15 minutes.  Gardening for 30 to 45 minutes.  Jumping rope for 15 minutes.  Washing windows or floors for 45 to 60 minutes. Document Released: 01/25/2010 Document Revised: 03/17/2011 Document Reviewed: 01/25/2010 [facility] Patient Information 2014 [place], [place].   Commit to 15 minutes twice a week to start your weight loss program. Gradually add 5 minutes every few weeks until you are exercising for 30 minutes per session. Once you have gotten in the habit of exercising 2 times a weeks for 30 minutes, then add a 3rd day so that you are working on physical activity 3 times a week. This is important for weight reduction.    Thyroid disorder- once I have reviewed your thyroid results, I will increase your medication if needed. I suspect you are not on enough medication.

## 2012-11-20 ENCOUNTER — Encounter: Payer: Self-pay | Admitting: Family Medicine

## 2012-11-24 NOTE — Telephone Encounter (Signed)
error 

## 2013-01-07 ENCOUNTER — Ambulatory Visit (INDEPENDENT_AMBULATORY_CARE_PROVIDER_SITE_OTHER): Payer: 59 | Admitting: Physician Assistant

## 2013-01-07 VITALS — BP 126/74 | HR 79 | Temp 98.4°F | Resp 18 | Ht 64.0 in | Wt 280.0 lb

## 2013-01-07 DIAGNOSIS — R059 Cough, unspecified: Secondary | ICD-10-CM

## 2013-01-07 DIAGNOSIS — R05 Cough: Secondary | ICD-10-CM

## 2013-01-07 DIAGNOSIS — R6889 Other general symptoms and signs: Secondary | ICD-10-CM

## 2013-01-07 LAB — POCT INFLUENZA A/B
Influenza A, POC: NEGATIVE
Influenza B, POC: NEGATIVE

## 2013-01-07 MED ORDER — HYDROCOD POLST-CHLORPHEN POLST 10-8 MG/5ML PO LQCR: 5.0000 mL | Freq: Two times a day (BID) | ORAL | Status: AC

## 2013-01-07 MED ORDER — GUAIFENESIN ER 1200 MG PO TB12
1.0000 | ORAL_TABLET | Freq: Two times a day (BID) | ORAL | Status: AC
Start: 2013-01-07 — End: 2013-01-14

## 2013-01-07 MED ORDER — OSELTAMIVIR PHOSPHATE 75 MG PO CAPS: 75.0000 mg | ORAL_CAPSULE | Freq: Two times a day (BID) | ORAL | Status: DC

## 2013-01-07 NOTE — Progress Notes (Signed)
   Subjective:    Patient ID: Robyn [name], female    DOB: 1970-01-16, 43 y.o.   MRN: [id number]  HPI Pt presents to clinic with <24h h/o cold symptoms.  She was here with her husband yesterday and then she started to feel bad last pm.  She has PND but no known of congestion.  She has a dry cough.  She has chills with subjective fevers.    OTC meds - saline nasal spray, mucinex cold and flu, motrin Sick contacts - husband sick No flu vaccine  Review of Systems  Constitutional: Positive for fever (subjective) and chills.  HENT: Positive for postnasal drip and sore throat.   Respiratory: Positive for cough.   Gastrointestinal: Positive for nausea and diarrhea. Negative for vomiting.  Musculoskeletal: Positive for myalgias.  Neurological: Positive for headaches.      Objective:   Physical Exam  Vitals reviewed. Constitutional: She appears well-developed and well-nourished.  HENT:  Head: Normocephalic and atraumatic.  Right Ear: Hearing, tympanic membrane, external ear and ear canal normal.  Left Ear: Hearing, tympanic membrane, external ear and ear canal normal.  Nose: Mucosal edema (red) present.  Mouth/Throat: Uvula is midline, oropharynx is clear and moist and mucous membranes are normal.  Cardiovascular: Normal rate, regular rhythm and normal heart sounds.   No murmur heard. Pulmonary/Chest: Effort normal and breath sounds normal. She has no wheezes.   Results for orders placed in visit on 01/07/13  POCT INFLUENZA A/B      Result Value Range   Influenza A, POC Negative     Influenza B, POC Negative           Assessment & Plan:  Flu-like symptoms - Plan: POCT Influenza A/B, oseltamivir (TAMIFLU) 75 MG capsule, Guaifenesin (MUCINEX MAXIMUM STRENGTH) 1200 MG TB12, chlorpheniramine-HYDROcodone (TUSSIONEX PENNKINETIC ER) 10-8 MG/5ML LQCR  Please push fluids.  Tylenol for fever and body aches and then if that does not treat her symptoms she will add low doses of motrin  due to her kidney disease.     Windell Hummingbird PA-C 01/07/2013 1:18 PM

## 2013-01-07 NOTE — Patient Instructions (Signed)
Please push fluids.  Tylenol and Motrin for fever and body aches.   I would start with tylenol 500mg  2 pills every 6h and then if that does not help add motrin for body aches.

## 2013-02-08 ENCOUNTER — Telehealth: Payer: Self-pay

## 2013-02-08 MED ORDER — ALPRAZOLAM 0.5 MG PO TABS: ORAL_TABLET | ORAL | Status: DC

## 2013-02-08 NOTE — Telephone Encounter (Signed)
I authorized Alprazolam with Averett at [facility] on [address].  He says his system is backlogged and system is not showing my DEA has been renewed. He will try to run the RX again tomorrow. If it does not go through, he will send it to a different pharmacy to see if there system if up-to-date with DEA renewals. Please advise pt.

## 2013-02-08 NOTE — Telephone Encounter (Signed)
THIS CALL WAS FROM AVERETT AT [facility] ([address]  HE STATES HE COULD NOT SEND THIS REFILL REQUEST ELECTRONICALLY BECAUSE DR. MCPHERSON'S DEA NUMBER EXPIRED ON JAN. 31, 2015.  THIS PATIENT IS REQUESTING A REFILL ON XANAX 0.5 MG. #30. SHE TAKES 1 AT BED TIME AS NEEDED FOR SLEEP.  BEST PHONE [phone] ([facility]) ASK FOR AVERETT ([place]) [employer] [phone]) [phone]    [place]

## 2013-02-09 NOTE — Telephone Encounter (Signed)
LMOM to CB. 

## 2013-02-11 NOTE — Telephone Encounter (Signed)
LMOM to verify pt received RX. Asked pt to let us know.

## 2013-02-13 NOTE — Telephone Encounter (Signed)
Spoke to [employer] [facility] at [employer]. This situation was fixed the next day. Pt has picked up rx.

## 2013-02-17 ENCOUNTER — Other Ambulatory Visit: Payer: Self-pay

## 2013-02-17 DIAGNOSIS — Z1231 Encounter for screening mammogram for malignant neoplasm of breast: Secondary | ICD-10-CM

## 2013-02-24 ENCOUNTER — Ambulatory Visit: Payer: 59 | Admitting: Physician Assistant

## 2013-02-28 ENCOUNTER — Other Ambulatory Visit: Payer: Self-pay | Admitting: Family Medicine

## 2013-02-28 NOTE — Telephone Encounter (Signed)
Dr Leward Quan, you saw pt in Nov for some chronic problems but not for HTN. Do you want to RF until next check-up due? Pended.

## 2013-03-02 NOTE — Telephone Encounter (Signed)
Coreg refilled for 4 months.

## 2013-03-08 ENCOUNTER — Ambulatory Visit
Admission: RE | Admit: 2013-03-08 | Discharge: 2013-03-08 | Disposition: A | Discharge status: Home / Self Care | Payer: Self-pay | Source: Ambulatory Visit

## 2013-03-08 DIAGNOSIS — Z1231 Encounter for screening mammogram for malignant neoplasm of breast: Secondary | ICD-10-CM

## 2013-03-14 ENCOUNTER — Encounter: Payer: Self-pay | Admitting: Neurology

## 2013-03-21 ENCOUNTER — Encounter: Payer: Self-pay | Admitting: Neurology

## 2013-03-27 ENCOUNTER — Other Ambulatory Visit: Payer: Self-pay | Admitting: Family Medicine

## 2013-03-27 ENCOUNTER — Ambulatory Visit (INDEPENDENT_AMBULATORY_CARE_PROVIDER_SITE_OTHER): Payer: 59 | Admitting: Family Medicine

## 2013-03-27 VITALS — BP 130/90 | HR 68 | Temp 97.8°F | Resp 18 | Ht 64.25 in | Wt 285.0 lb

## 2013-03-27 DIAGNOSIS — J329 Chronic sinusitis, unspecified: Secondary | ICD-10-CM

## 2013-03-27 MED ORDER — AMOXICILLIN-POT CLAVULANATE 875-125 MG PO TABS: 1.0000 | ORAL_TABLET | Freq: Two times a day (BID) | ORAL | Status: DC

## 2013-03-27 NOTE — Progress Notes (Signed)
This chart was scribed for Robyn Haber, MD by Roxan Diesel, ED scribe.  This patient was seen in [place] [facility] and the patient's care was started at 8:08 AM.    Robyn [name] MRN: [id number], DOB: Sep 08, 1970, 43 y.o. Date of Encounter: 03/27/2013, 8:10 AM  Primary Physician: [name], MD  Chief Complaint:  Chief Complaint  Patient presents with   Nasal Congestion     itchy eyes - OTC drugs & Claritin   Sinusitis    all symptoms x 4 days    HPI: 43 y.o. year old female presents with a 4 day history of nasal congestion, post nasal drip, sore throat, and cough. Mild sinus pressure. Afebrile. No chills. Nasal congestion thick and green/yellow. Cough is productive of green/yellow sputum and not associated with time of day. Ears feel full, leading to sensation of muffled hearing. Has tried OTC cold preps without success. No GI complaints. Appetite fair  No sick contacts, recent antibiotics, or recent travels.   No leg trauma, sedentary periods, h/o cancer, or tobacco use.  Pt is allergic to Sulfa.  She denies any other medication allergies.  Past Medical History  Diagnosis Date   Hypertension    Shortness of breath     ONLY WITH ANXIETY   Anxiety     OCCAS PANIC ATTACKS   Blood transfusion 1990'S   Hypokalemia     PAST HX   Abnormal Pap smear    H/O varicella    Pregnancy induced hypertension    Depression 2010   Weight loss 2010   Increased BMI    H/O dysmenorrhea    Ovarian cyst 2011   Fibroid 2011   H/O: menorrhagia 2011   BV (bacterial vaginosis)    Perimenopausal symptoms 07/15/2010   Sleep apnea     PT USES CPAP SOMETIMES - SETTING IS 3   Kidney stones    GERD (gastroesophageal reflux disease)     occasional   Cough     nonproductive cough last 2 weeks   Obesity    Allergy    Blood transfusion without reported diagnosis      Home Meds: Prior to Admission medications   Medication Sig Start Date End Date Taking?  Authorizing Provider  ALPRAZolam Duanne Moron) 0.5 MG tablet TAKE ONE TABLET BY MOUTH AT BEDTIME AS NEEDED FOR SLEEP 02/08/13  Yes [name], MD  amLODipine (NORVASC) 10 MG tablet Take 1 tablet (10 mg total) by mouth daily. 01/22/12  Yes [name], MD  carvedilol (COREG) 25 MG tablet TAKE ONE TABLET BY MOUTH TWICE DAILY WITH MEALS   Yes [name], MD  doxazosin (CARDURA) 8 MG tablet Take 8 mg by mouth at bedtime.   Yes Historical Provider, MD  KLOR-CON M20 20 MEQ tablet TAKE TWO TABLETS BY MOUTH THREE TIMES DAILY 08/27/12  Yes Mancel Bale, PA-C  levothyroxine (SYNTHROID, LEVOTHROID) 25 MCG tablet Take 1 tablet (25 mcg total) by mouth daily before breakfast. 11/18/12  Yes [name], MD  lisinopril (PRINIVIL,ZESTRIL) 40 MG tablet Take 40 mg by mouth daily.   Yes Historical Provider, MD  Multiple Vitamins-Minerals (MULTIVITAMIN WITH MINERALS) tablet Take 1 tablet by mouth daily.   Yes Historical Provider, MD  spironolactone (ALDACTONE) 100 MG tablet Take 100 mg by mouth daily.   Yes Historical Provider, MD  SUMAtriptan (IMITREX) 25 MG tablet Take 25 mg by mouth 2 (two) times daily as needed for migraine or headache. May repeat in 2 hours if headache  persists or recurs.   Yes Historical Provider, MD  fexofenadine (ALLEGRA) 180 MG tablet Take 180 mg by mouth daily as needed. For allergies    Historical Provider, MD  ondansetron (ZOFRAN) 4 MG tablet Take 1 tablet (4 mg total) by mouth every 8 (eight) hours as needed for nausea or vomiting. 11/18/12   [name], MD  ranitidine (ZANTAC) 150 MG tablet Take 1 tablet (150 mg total) by mouth 2 (two) times daily. 07/25/12   Blanchie Dessert, MD    Allergies:  Allergies  Allergen Reactions   Sulfa Antibiotics Hives and Rash    History   Social History   Marital Status: Married    Spouse Name: N/A    Number of Children: N/A   Years of Education: N/A   Occupational History   Not on file.   Social History  Main Topics   Smoking status: Former Smoker -- 0.25 packs/day for 15 years   Smokeless tobacco: Never Used   Alcohol Use: No   Drug Use: No   Sexual Activity: Yes    Museum/gallery curator: Surgical     Comment: BTL 1999   Other Topics Concern   Not on file   Social History Narrative   No narrative on file     Review of Systems: Constitutional: negative for chills, fever, night sweats or weight changes Cardiovascular: negative for chest pain or palpitations Respiratory: negative for hemoptysis, wheezing, or shortness of breath Abdominal: negative for abdominal pain, nausea, vomiting or diarrhea Dermatological: negative for rash Neurologic: negative for headache   Physical Exam: Blood pressure 130/90, pulse 68, temperature 97.8 F (36.6 C), temperature source Oral, resp. rate 18, height 5' 4.25" (1.632 m), weight 285 lb (129.275 kg), SpO2 97.00%., Body mass index is 48.54 kg/(m^2). General: Well developed, well nourished, in no acute distress. Head: Normocephalic, atraumatic, eyes without discharge, sclera non-icteric, nares are congested. Bilateral auditory canals clear, TM's are without perforation, pearly grey with reflective cone of light bilaterally. No sinus TTP. Oral cavity moist, dentition normal. Posterior pharynx with post nasal drip and mild erythema. No peritonsillar abscess or tonsillar exudate.  Marked nasal obstruction bilaterally Neck: Supple. No thyromegaly. Full ROM. No lymphadenopathy. Lungs: Coarse breath sounds bilaterally without wheezes, rales, or rhonchi. Breathing is unlabored.  Heart: RRR with S1 S2. No murmurs, rubs, or gallops appreciated. Msk:  Strength and tone normal for age. Extremities: No clubbing or cyanosis. No edema. Neuro: Alert and oriented X 3. Moves all extremities spontaneously. CNII-XII grossly in tact. Psych:  Responds to questions appropriately with a normal affect.     ASSESSMENT AND PLAN:  43 y.o. year old female with  sinusitis Sinusitis - Plan: amoxicillin-clavulanate (AUGMENTIN) 875-125 MG per tablet   - -Tylenol/Motrin prn -Rest/fluids -RTC precautions -RTC 3-5 days if no improvement  Signed, Robyn Haber, MD 03/27/2013 8:10 AM

## 2013-03-27 NOTE — Patient Instructions (Signed)

## 2013-04-13 ENCOUNTER — Institutional Professional Consult (permissible substitution): Payer: 59 | Admitting: Neurology

## 2013-04-22 ENCOUNTER — Ambulatory Visit (INDEPENDENT_AMBULATORY_CARE_PROVIDER_SITE_OTHER): Payer: 59 | Admitting: Neurology

## 2013-04-22 ENCOUNTER — Encounter: Payer: Self-pay | Admitting: Neurology

## 2013-04-22 VITALS — BP 130/90 | HR 72 | Resp 17 | Ht 65.5 in | Wt 288.0 lb

## 2013-04-22 DIAGNOSIS — Z87898 Personal history of other specified conditions: Secondary | ICD-10-CM

## 2013-04-22 DIAGNOSIS — G5600 Carpal tunnel syndrome, unspecified upper limb: Secondary | ICD-10-CM

## 2013-04-22 DIAGNOSIS — G4733 Obstructive sleep apnea (adult) (pediatric): Secondary | ICD-10-CM

## 2013-04-22 DIAGNOSIS — Z9189 Other specified personal risk factors, not elsewhere classified: Secondary | ICD-10-CM

## 2013-04-22 HISTORY — DX: Obstructive sleep apnea (adult) (pediatric): G47.33

## 2013-04-22 NOTE — Patient Instructions (Signed)
Obesity Obesity is having too much body fat and a body mass index (BMI) of 30 or more. BMI is a number based on your height and weight. The number is an estimate of how much body fat you have. Obesity can happen if you eat more calories than you can burn by exercising or other activity. It can cause major health problems or emergencies.  HOME CARE  Exercise and be active as told by your doctor. Try:  Using stairs when you can.  Parking farther away from store doors.  Gardening, biking, or walking.  Eat healthy foods and drinks that are low in calories. Eat more fruits and vegetables.  Limit fast food, sweets, and snack foods that are made with ingredients that are not natural (processed food).  Eat smaller amounts of food.  Keep a journal and write down what you eat every day. Websites can help with this.  Avoid drinking alcohol. Drink more water and drinks without calories.   Take vitamins and dietary pills (supplements) only as told by your doctor.  Try going to weight-loss support groups or classes to help lessen stress. Dieticians and counselors may also help. GET HELP RIGHT AWAY IF:  You have chest pain or tightness.  You have trouble breathing or feel short of breath.  You feel weak or have loss of feeling (numbness) in your legs.  You feel confused or have trouble talking.  You have sudden changes in your vision. MAKE SURE YOU:  Understand these instructions.  Will watch your condition.  Will get help right away if you are not doing well or get worse. Document Released: 03/17/2011 Document Reviewed: 03/17/2011 [facility] Patient Information 2014 [place].

## 2013-04-22 NOTE — Progress Notes (Signed)
[facility]  Provider:  Larey [name], M D  Referring Provider: [name], MD Primary Care Physician:  Robyn Lennox, MD  Chief Complaint  Patient presents with  . Follow-up    Room 11  . Sleep consult    HPI:  Robyn [name] is a 42 y.o., right handed, AA female . She  is seen here as a referral  from Dr. Joseph [name] after a sleep study on 09-10-12.   [name] was referred in summer 2014 for the evaluation of possible sleep apnea. The patient had complained of excessive daytime sleepiness witnessed apneas as well as loud snoring reported. She past medical history of obesity, hypertension, chronic kidney disease, hypokalemia and nephrolithiasis. The patient had been diagnosed with obstructive sleep apnea years prior and records were longer available for [place]. For a brief time she had tried to use CPAP in the past but the machine broke and she couldn't replace it for some time. She had endorsed the Epworth Sleepiness Scale at 15 of 24 points and the he HQ and 9 at 15 points, indicating depression. Her BMI was 47.8. She had felt fatigued for years.   The patient was diagnosed with an AHI of 33.4 and the same RDI. REM sleep was not noted and in supine sleep her AHI was mildly worse at 40.0. She did not have the tract but desaturations of oxygen. Unfortunately, the Co2 monitor was not working that night.  The patient was titrated to 10 cm water CPAP with a nasal pillow. Compliance instructions were included under the recommendations. Positional therapy and weight loss were recommended.  [facility] followed her over the CPAP use ,  the download was obtained and forwarded to [place] dated 03-07-13.  The patient had 100% thirty- out of 30 day compliance, and  70% of days  for over 4 hours nightly use,  The machine is set at 10 cm water pressure with an EPR of 2 cm water , her residual AHI is 0.4.  Based on compliance  the low AHI, the therapy has been very  effective for the patient. Average daily use was 5 hours and 24 minutes, the median  air leak was only 0.90 L / minute.  In the week of March the patient noted problems with her nasal pillows  slipping easier off the nostrils,  which has caused  irritation -  the nostrils have  some burning. fo the last 7 days she felt not able to use CPAP 4 hours or more.  It feels to her that the mask has become brittle. Thus, to not become a factor of compliance,  I will ask [facility] to replace the nasal pillow and review her headgear today.   The patient works daily 8 to 5, she has nausea history. She reports a that she does not nap in daytime, after work she drives about [id number] minutes to her home, she likes to read on her kindle and watches TV in her bedroom, she goes to her bedroom once she feels sleepy. That is on average at about 11PM. She falls asleep promptly, she does not watch TV in bed. Her bedroom is cool, quiet and dark. She shares the bed with her husband , who snores and she has witnessed him being apnoeic. She rises at 6.45 and feels not refreshed , but better since being on CPAP. She drinks 2 cups of coffee in AM, her work place has day light exposure. She drives to  work 15 -20 minutes and has never fallen asleep while driving..   She has a daughter, who is overweight and snores.  She has understood that a dental device alone would not cover the therapy for her APNEA, but is motivated to loose weight.         Review of Systems: Out of a complete 14 system review, the patient complains of only the following symptoms, and all other reviewed systems are negative. Prior to CPAP use the patient had endorsed 15 points on her at Epworth sleepiness or in the depression score at 15 points, today she endorsed  14 points after having had several days less than 4 hours on nightly CPAP therapy - she states when she uses CPAP for 5-6 hours at night, she would endorse a lower point score. Her fatigue  square was still quite high at 50 points.  History   Social History  . Marital Status: Married    Spouse Name: Robyn [name]    Number of Children: 2  . 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 Smoking status: Former Smoker -- 0.25 packs/day for 15 years  . Smokeless tobacco: Never Used  . Alcohol Use: No  . Drug Use: No  . Sexual Activity: Yes    Birth Control/ Protection: Surgical     Comment: BTL 1999   Other Topics Concern  . Not on file   Social History Narrative   Patient is married Robyn [name]) and lives at home with her husband and daughter.   Patient has two children.   Patient is working and attending college full-time.   Patient has a college education.   Patient is right-handed.   Patient drinks two cups of coffee daily and one cup of soda and tea daily.    Family History  Problem Relation Age of Onset  . Diabetes Mother   . Hypertension Mother   . Hypertension Father   . Diabetes Father   . Kidney disease Father   . Diabetes Paternal Grandmother   . Diabetes Maternal Grandmother   . Heart disease Maternal Grandmother     Past Medical History  Diagnosis Date  . Hypertension   . Shortness of breath     ONLY WITH ANXIETY  . Anxiety     OCCAS PANIC ATTACKS  . Blood transfusion 1990'S  . Hypokalemia     PAST HX  . Abnormal Pap smear   . H/O varicella   . Pregnancy induced hypertension   . Depression 2010  . Weight loss 2010  . Increased BMI   . H/O dysmenorrhea   . Ovarian cyst 2011  . Fibroid 2011  . H/O: menorrhagia 2011  . BV (bacterial vaginosis)   . Perimenopausal symptoms 07/15/2010  . Sleep apnea     PT USES CPAP SOMETIMES - SETTING IS 3  . Kidney stones   . GERD (gastroesophageal reflux disease)     occasional  . Cough     nonproductive cough last 2 weeks  . Obesity   . Allergy   . Blood transfusion without reported diagnosis   . OSA (obstructive sleep apnea)  04/22/2013    Past Surgical History  Procedure Laterality Date  . Percutaneous nephrostomy around 1996  may 2013    right kidney  . Uterine ablation march 2010  march 2011  . C-sections x 2    . Nephrolithotomy  04/09/2011  Procedure: NEPHROLITHOTOMY PERCUTANEOUS;  Surgeon: Molli Hazard, MD;  Location: [facility];  Service: Urology;  Laterality: Right;      . Ureteroscopy  04/09/2011    Procedure: URETEROSCOPY;  Surgeon: Molli Hazard, MD;  Location: [facility];  Service: Urology;  Laterality: Right;  . Tubal ligation  1999  . Nephrolithotomy  10/15/2011    Procedure: NEPHROLITHOTOMY PERCUTANEOUS;  Surgeon: Molli Hazard, MD;  Location: [facility];  Service: Urology;  Laterality: Left;  . Removal of stones  10/15/2011    Procedure: REMOVAL OF STONES;  Surgeon: Molli Hazard, MD;  Location: [facility];  Service: Urology;  Laterality: Left;  . Cholecystectomy  1994  . Cesarean section  1993 and 1999    Current Outpatient Prescriptions  Medication Sig Dispense Refill  . ALPRAZolam (XANAX) 0.5 MG tablet TAKE ONE TABLET BY MOUTH AT BEDTIME AS NEEDED FOR SLEEP  30 tablet  1  . amLODipine (NORVASC) 10 MG tablet Take 1 tablet (10 mg total) by mouth daily.  30 tablet  5  . carvedilol (COREG) 25 MG tablet TAKE ONE TABLET BY MOUTH TWICE DAILY WITH MEALS  60 tablet  3  . doxazosin (CARDURA) 8 MG tablet Take 8 mg by mouth at bedtime.      Marland Kitchen levothyroxine (SYNTHROID, LEVOTHROID) 25 MCG tablet TAKE ONE TABLET BY MOUTH ONCE DAILY BEFORE BREAKFAST  30 tablet  1  . lisinopril (PRINIVIL,ZESTRIL) 40 MG tablet Take 40 mg by mouth daily.      . Multiple Vitamins-Minerals (MULTIVITAMIN WITH MINERALS) tablet Take 1 tablet by mouth daily.      Marland Kitchen spironolactone (ALDACTONE) 100 MG tablet Take 100 mg by mouth daily.      . SUMAtriptan (IMITREX) 25 MG tablet Take 25 mg by mouth 2 (two) times daily as needed for migraine or headache. May repeat in 2 hours if headache persists or recurs.       No  current facility-administered medications for this visit.    Allergies as of 04/22/2013 - Review Complete 04/22/2013  Allergen Reaction Noted  . Sulfa antibiotics Hives and Rash 04/01/2011    Vitals: BP 130/90  Pulse 72  Resp 17  Ht 5' 5.5" (1.664 m)  Wt 288 lb (130.636 kg)  BMI 47.18 kg/m2 Last Weight:  Wt Readings from Last 1 Encounters:  04/22/13 288 lb (130.636 kg)   Last Height:   Ht Readings from Last 1 Encounters:  04/22/13 5' 5.5" (1.664 m)    Physical exam:  General: The patient is awake, alert and appears not in acute distress. The patient is well groomed. Head: Normocephalic, atraumatic. Neck is supple. Mallampati 4 , neck circumference: 19 inches , large tongue.  Cardiovascular:  Regular rate and rhythm , without  murmurs or carotid bruit, and without distended neck veins. Respiratory: Lungs are clear to auscultation. Skin:  Without evidence of edema, or rash Trunk: BMI is severely elevated, the patient has normal posture.  Neurologic exam : The patient is awake and alert, oriented to place and time.  Memory subjective described as intact.  There is a normal attention span & concentration ability. Speech is fluent without dysarthria, dysphonia or aphasia. Mood and affect are appropriate.  Cranial nerves: no loss of taste or smell reported. Pupils are equal and briskly reactive to light. Funduscopic exam without  evidence of pallor or edema.  Extraocular movements  in vertical and horizontal planes intact and without nystagmus. Visual fields by finger perimetry are intact. Hearing to finger rub  intact.  Facial sensation intact to fine touch. Facial motor strength is symmetric and tongue and uvula move midline.  Motor exam:  Normal tone ,muscle bulk and symmetric strength in all extremities, except  Hands- she has a lesser grip and pinch strength in the left hand, and stated she feels her hand tingling " asleep" feeling in the morning.   Sensory:  Fine touch,  pinprick and vibration were tested in all extremities. Proprioception is  normal.  Coordination: Rapid alternating movements in the fingers/hands is  without evidence of ataxia, dysmetria or tremor.  Gait and station: Patient walks without assistive device . Strength within normal limits. Stance is stable and normal. Tandem  intact ,  Steps are unfragmented. Romberg testing is  normal.  Deep tendon reflexes: in the  upper and lower extremities are symmetric and intact. Babinski maneuver response is  downgoing.   Assessment:  After physical and neurologic examination, review of laboratory studies, imaging, neurophysiology testing and pre-existing records, assessment is  1) OSA well controlled on CPAP - it is likely that REM will accentuate her apnea, she has no longer snoring at night. Nocturia reduced form 3 to one time. CPAP compliance was very good, needs new mask.  2) Obesity, BMI  Between 40 and 50, no longer morning headaches, likely hypoventilation related.  3) Carpal tunnel left wrist. - brace for wrist to use at night. Patient works with repetitive motion , office environment.   Plan:  Treatment plan and additional workup :  1)DME is [facility], provide new mask for patient. 2) weight loss program, weight watchers discussed.  3) brace for left wrist , [facility] or similar - soft ace .

## 2013-05-16 ENCOUNTER — Other Ambulatory Visit: Payer: Self-pay

## 2013-05-16 NOTE — Telephone Encounter (Signed)
Rec Request for refill.

## 2013-05-18 MED ORDER — ALPRAZOLAM 0.5 MG PO TABS: ORAL_TABLET | ORAL | Status: DC

## 2013-05-18 NOTE — Telephone Encounter (Signed)
Alprazolam refill phoned to pt's pharmacy. 

## 2013-06-01 ENCOUNTER — Encounter: Payer: Self-pay | Admitting: Internal Medicine

## 2013-06-03 ENCOUNTER — Other Ambulatory Visit: Payer: Self-pay | Admitting: Physician Assistant

## 2013-07-07 ENCOUNTER — Other Ambulatory Visit: Payer: Self-pay | Admitting: Family Medicine

## 2013-07-08 ENCOUNTER — Other Ambulatory Visit: Payer: Self-pay | Admitting: Family Medicine

## 2013-07-08 MED ORDER — ALPRAZOLAM 0.5 MG PO TABS: ORAL_TABLET | ORAL | Status: DC

## 2013-07-08 NOTE — Telephone Encounter (Signed)
Alprazolam refill phoned to pt's pharmacy. Pt needs to sch OV for additional refills.

## 2013-07-11 ENCOUNTER — Other Ambulatory Visit: Payer: Self-pay | Admitting: Physician Assistant

## 2013-07-14 ENCOUNTER — Encounter: Payer: Self-pay | Admitting: Interventional Cardiology

## 2013-07-25 ENCOUNTER — Other Ambulatory Visit: Payer: Self-pay | Admitting: Family Medicine

## 2013-07-28 ENCOUNTER — Other Ambulatory Visit: Payer: Self-pay | Admitting: Family Medicine

## 2013-07-28 ENCOUNTER — Telehealth: Payer: Self-pay

## 2013-07-28 MED ORDER — LEVOTHYROXINE SODIUM 25 MCG PO TABS: 25.0000 ug | ORAL_TABLET | Freq: Every day | ORAL | Status: DC

## 2013-07-28 NOTE — Telephone Encounter (Signed)
Spoke to pt- transferred to make an appt. Sent in 30 day supply to [facility].

## 2013-07-28 NOTE — Telephone Encounter (Signed)
Needs a refill for thyroid medication ASAP. Pt is also interested in getting a few months supply so she does not have to request each month. Pt understands that she may need to make an appointment before this is filled.

## 2013-07-28 NOTE — Telephone Encounter (Signed)
Pt called inquiring about this medication, let her know we had received the request and she should receive a phone call in 24-48 hours

## 2013-08-02 ENCOUNTER — Other Ambulatory Visit: Payer: Self-pay | Admitting: Family Medicine

## 2013-08-08 ENCOUNTER — Ambulatory Visit: Payer: 59 | Admitting: Internal Medicine

## 2013-08-09 ENCOUNTER — Other Ambulatory Visit: Payer: Self-pay | Admitting: Family Medicine

## 2013-08-11 ENCOUNTER — Other Ambulatory Visit: Payer: Self-pay | Admitting: Family Medicine

## 2013-08-12 ENCOUNTER — Ambulatory Visit (INDEPENDENT_AMBULATORY_CARE_PROVIDER_SITE_OTHER): Payer: 59 | Admitting: Family Medicine

## 2013-08-12 ENCOUNTER — Ambulatory Visit (INDEPENDENT_AMBULATORY_CARE_PROVIDER_SITE_OTHER): Payer: 59

## 2013-08-12 ENCOUNTER — Encounter: Payer: Self-pay | Admitting: Family Medicine

## 2013-08-12 VITALS — BP 120/90 | HR 78 | Temp 98.8°F | Resp 16 | Ht 64.5 in | Wt 295.4 lb

## 2013-08-12 DIAGNOSIS — M545 Low back pain, unspecified: Secondary | ICD-10-CM

## 2013-08-12 DIAGNOSIS — I10 Essential (primary) hypertension: Secondary | ICD-10-CM

## 2013-08-12 DIAGNOSIS — G8929 Other chronic pain: Secondary | ICD-10-CM

## 2013-08-12 DIAGNOSIS — E039 Hypothyroidism, unspecified: Secondary | ICD-10-CM

## 2013-08-12 DIAGNOSIS — E559 Vitamin D deficiency, unspecified: Secondary | ICD-10-CM

## 2013-08-12 MED ORDER — LEVOTHYROXINE SODIUM 25 MCG PO TABS: 25.0000 ug | ORAL_TABLET | Freq: Every day | ORAL | Status: DC

## 2013-08-12 MED ORDER — ALPRAZOLAM 0.5 MG PO TABS: ORAL_TABLET | ORAL | Status: DC

## 2013-08-12 MED ORDER — CARVEDILOL 25 MG PO TABS: ORAL_TABLET | ORAL | Status: DC

## 2013-08-12 MED ORDER — TRAMADOL HCL 50 MG PO TABS: 50.0000 mg | ORAL_TABLET | Freq: Two times a day (BID) | ORAL | Status: DC | PRN

## 2013-08-12 MED ORDER — AMLODIPINE BESYLATE 10 MG PO TABS: 10.0000 mg | ORAL_TABLET | Freq: Every day | ORAL | Status: DC

## 2013-08-12 NOTE — Progress Notes (Signed)
Subjective:    Patient ID: Robyn [name], female    DOB: 14-Feb-1970, 43 y.o.   MRN: [id number]  HPI  This 43 y.o. AA female has HTN and Stage III CKD, followed by Dr. Posey Pronto at [facility]. She is compliant w/ medications and voices no adverse medication reactions.   Today, she c/o LBP which is a chronic problem; Pain now seems to radiate into lower extremities and interrupts sleep. Pt has stressful lifestyle and spends a lot of time at a desk; no time for regular fitness routine. She denies any trauma predating LBP. She is aware that LBP negatively impacted by weight; she has gained weight since last visit w/ me in Nov 2014. Pt denies fever, numbness/ weakness in legs, saddle anesthesia or gait abnormality.  Pt has hypothyroidism and is compliant with medication. She denies fatigue, significant weight change, palpitations, hair or skin changes, GI problems, agitation, behavior or concentration disturbances or HA .  Chronic Vitamin D def- pt takes supplement most days of the week but sun exposure is limited due to work schedule.  Patient Active Problem List   Diagnosis Date Noted  . OSA (obstructive sleep apnea) 04/22/2013  . Unspecified sleep apnea 09/30/2012  . Positive Helicobacter pylori titer 03/19/2012  . Chronic low back pain 01/22/2012  . Subclinical diabetes mellitus 01/22/2012  . Obesity   . Nausea and vomiting 12/28/2011  . Hypertension 12/28/2011  . Nephrolithiasis 10/15/2011  . Hypokalemia 08/22/2011  . CKD (chronic kidney disease), stage III 08/22/2011  . Anxiety disorder 07/29/2011  . Tobacco user 07/29/2011    Prior to Admission medications   Medication Sig Start Date End Date Taking? Authorizing Provider  ALPRAZolam Duanne Moron) 0.5 MG tablet TAKE ONE TABLET BY MOUTH ONCE DAILY AT BEDTIME AS NEEDED FOR SLEEP 08/12/13  Yes [name], MD  amLODipine (NORVASC) 10 MG tablet Take 1 tablet (10 mg total) by mouth daily.   Yes [name], MD    carvedilol (COREG) 25 MG tablet TAKE ONE TABLET BY MOUTH TWICE DAILY WITH MEALS   Yes [name], MD  doxazosin (CARDURA) 8 MG tablet Take 8 mg by mouth at bedtime.   Yes Historical Provider, MD  levothyroxine (SYNTHROID, LEVOTHROID) 25 MCG tablet Take 1 tablet (25 mcg total) by mouth daily.   Yes [name], MD  lisinopril (PRINIVIL,ZESTRIL) 40 MG tablet Take 40 mg by mouth daily.   Yes Historical Provider, MD  Multiple Vitamins-Minerals (MULTIVITAMIN WITH MINERALS) tablet Take 1 tablet by mouth daily.   Yes Historical Provider, MD  spironolactone (ALDACTONE) 100 MG tablet Take 100 mg by mouth daily.   Yes Historical Provider, MD  SUMAtriptan (IMITREX) 25 MG tablet Take 25 mg by mouth 2 (two) times daily as needed for migraine or headache. May repeat in 2 hours if headache persists or recurs.    Historical Provider, MD    Allergies  Allergen Reactions  . Sulfa Antibiotics Hives and Rash    Past Surgical History  Procedure Laterality Date  . Percutaneous nephrostomy around 1996  may 2013    right kidney  . Uterine ablation march 2010  march 2011  . C-sections x 2    . Nephrolithotomy  04/09/2011    Procedure: NEPHROLITHOTOMY PERCUTANEOUS;  Surgeon: Molli Hazard, MD;  Location: [facility];  Service: Urology;  Laterality: Right;      . Ureteroscopy  04/09/2011    Procedure: URETEROSCOPY;  Surgeon: Molli Hazard, MD;  Location: [facility];  Service: Urology;  Laterality: Right;  . Tubal ligation  1999  . Nephrolithotomy  10/15/2011    Procedure: NEPHROLITHOTOMY PERCUTANEOUS;  Surgeon: Molli Hazard, MD;  Location: [facility];  Service: Urology;  Laterality: Left;  . Removal of stones  10/15/2011    Procedure: REMOVAL OF STONES;  Surgeon: Molli Hazard, MD;  Location: [facility];  Service: Urology;  Laterality: Left;  . Cholecystectomy  1994  . Cesarean section  1993 and 1999    History   Social History  . Marital Status: Married    Spouse Name:  Iona Beard    Number of Children: 2  . 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 Smoking status: Former Smoker -- 0.25 packs/day for 15 years  . Smokeless tobacco: Never Used  . Alcohol Use: No  . Drug Use: No  . Sexual Activity: Yes    Birth Control/ Protection: Surgical     Comment: BTL 1999   Other Topics Concern  . Not on file   Social History Narrative   Patient is married Iona Beard) and lives at home with her husband and daughter.   Patient has two children.   Patient is working and attending college full-time.   Patient has a college education.   Patient is right-handed.   Patient drinks two cups of coffee daily and one cup of soda and tea daily.    Review of Systems  As per HPI.     Objective:   Physical Exam  Nursing note and vitals reviewed. Constitutional: She is oriented to person, place, and time. She appears well-developed and well-nourished. No distress.  HENT:  Head: Normocephalic and atraumatic.  Eyes: Conjunctivae and EOM are normal. Pupils are equal, round, and reactive to light. No scleral icterus.  Neck: Normal range of motion. Neck supple. No thyromegaly present.  Cardiovascular: Normal rate and regular rhythm.   Pulmonary/Chest: Effort normal. No respiratory distress.  Musculoskeletal:       Cervical back: Normal.       Thoracic back: Normal.       Lumbar back: She exhibits tenderness and spasm. She exhibits no bony tenderness, no deformity and no pain.       Right upper leg: Normal.       Left upper leg: Normal.       Right lower leg: Normal.       Left lower leg: Normal.  Forward flexion > 45 degrees w/o difficulty. Pt can bear weight on either leg for brief periods but hard to maintain balance. Difficult to walk on toes/ heels.  Neurological: She is alert and oriented to person, place, and time. She has normal strength. She displays no atrophy. No cranial nerve deficit or sensory  deficit. She exhibits normal muscle tone. Coordination and gait normal.  Reflex Scores:      Bicep reflexes are 1+ on the right side and 1+ on the left side.      Patellar reflexes are 1+ on the right side and 1+ on the left side. Skin: Skin is warm, dry and intact. She is not diaphoretic. No cyanosis or erythema.  Psychiatric: She has a normal mood and affect. Her behavior is normal. Judgment and thought content normal.    [facility] reading (PRIMARY) by  Dr. Leward Quan: Lumbosacral spine- Increased lordosis but well preserved disc spaces. No fracture of dislocation.     Assessment & Plan:  Chronic  low back pain - Suspect either lumbosacral strain or sciatica. Plan: DG Lumbar Spine Complete  Essential hypertension - Plan: BASIC METABOLIC PANEL WITH GFR, Thyroid Panel With TSH -The patient requests lab results be sent to Dr. Elmarie Shiley at [facility]  Unspecified hypothyroidism - Plan: Thyroid Panel With TSH  Unspecified vitamin D deficiency - Plan: Vitamin D, 25-hydroxy   Meds ordered this encounter  Medications  . carvedilol (COREG) 25 MG tablet    Sig: TAKE ONE TABLET BY MOUTH TWICE DAILY WITH MEALS    Dispense:  60 tablet    Refill:  5  . amLODipine (NORVASC) 10 MG tablet    Sig: Take 1 tablet (10 mg total) by mouth daily.    Dispense:  30 tablet    Refill:  5  . levothyroxine (SYNTHROID, LEVOTHROID) 25 MCG tablet    Sig: Take 1 tablet (25 mcg total) by mouth daily.    Dispense:  30 tablet    Refill:  0  . ALPRAZolam (XANAX) 0.5 MG tablet    Sig: TAKE ONE TABLET BY MOUTH ONCE DAILY AT BEDTIME AS NEEDED FOR SLEEP    Dispense:  30 tablet    Refill:  1    May fill on or after 8/12/ 2015. RF must last 30 days.  . traMADol (ULTRAM) 50 MG tablet    Sig: Take 1 tablet (50 mg total) by mouth every 12 (twelve) hours as needed.    Dispense:  60 tablet    Refill:  1

## 2013-08-12 NOTE — Patient Instructions (Signed)
Lumbosacral Strain Lumbosacral strain is a strain of any of the parts that make up your lumbosacral vertebrae. Your lumbosacral vertebrae are the bones that make up the lower third of your backbone. Your lumbosacral vertebrae are held together by muscles and tough, fibrous tissue (ligaments).  CAUSES  A sudden blow to your back can cause lumbosacral strain. Also, anything that causes an excessive stretch of the muscles in the low back can cause this strain. This is typically seen when people exert themselves strenuously, fall, lift heavy objects, bend, or crouch repeatedly. RISK FACTORS  Physically demanding work.  Participation in pushing or pulling sports or sports that require a sudden twist of the back (tennis, golf, baseball).  Weight lifting.  Excessive lower back curvature.  Forward-tilted pelvis.  Weak back or abdominal muscles or both.  Tight hamstrings. SIGNS AND SYMPTOMS  Lumbosacral strain may cause pain in the area of your injury or pain that moves (radiates) down your leg.  DIAGNOSIS Your health care provider can often diagnose lumbosacral strain through a physical exam. In some cases, you may need tests such as X-ray exams.  TREATMENT  Treatment for your lower back injury depends on many factors that your clinician will have to evaluate. However, most treatment will include the use of anti-inflammatory medicines. HOME CARE INSTRUCTIONS   Avoid hard physical activities (tennis, racquetball, waterskiing) if you are not in proper physical condition for it. This may aggravate or create problems.  If you have a back problem, avoid sports requiring sudden body movements. Swimming and walking are generally safer activities.  Maintain good posture.  Maintain a healthy weight.  For acute conditions, you may put ice on the injured area.  Put ice in a plastic bag.  Place a towel between your skin and the bag.  Leave the ice on for 20 minutes, 2-3 times a day.  When the  low back starts healing, stretching and strengthening exercises may be recommended. SEEK MEDICAL CARE IF:  Your back pain is getting worse.  You experience severe back pain not relieved with medicines. SEEK IMMEDIATE MEDICAL CARE IF:   You have numbness, tingling, weakness, or problems with the use of your arms or legs.  There is a change in bowel or bladder control.  You have increasing pain in any area of the body, including your belly (abdomen).  You notice shortness of breath, dizziness, or feel faint.  You feel sick to your stomach (nauseous), are throwing up (vomiting), or become sweaty.  You notice discoloration of your toes or legs, or your feet get very cold. MAKE SURE YOU:   Understand these instructions.  Will watch your condition.  Will get help right away if you are not doing well or get worse. Document Released: 10/02/2004 Document Revised: 12/28/2012 Document Reviewed: 08/11/2012 [facility] Patient Information 2015 [place], [place]. This information is not intended to replace advice given to you by your health care provider. Make sure you discuss any questions you have with your health care provider.    Sciatica Sciatica is pain, weakness, numbness, or tingling along the path of the sciatic nerve. The nerve starts in the lower back and runs down the back of each leg. The nerve controls the muscles in the lower leg and in the back of the knee, while also providing sensation to the back of the thigh, lower leg, and the sole of your foot. Sciatica is a symptom of another medical condition. For instance, nerve damage or certain conditions, such as a herniated  disk or bone spur on the spine, pinch or put pressure on the sciatic nerve. This causes the pain, weakness, or other sensations normally associated with sciatica. Generally, sciatica only affects one side of the body. CAUSES   Herniated or slipped disc.  Degenerative disk disease.  A pain disorder involving the  narrow muscle in the buttocks (piriformis syndrome).  Pelvic injury or fracture.  Pregnancy.  Tumor (rare). SYMPTOMS  Symptoms can vary from mild to very severe. The symptoms usually travel from the low back to the buttocks and down the back of the leg. Symptoms can include:  Mild tingling or dull aches in the lower back, leg, or hip.  Numbness in the back of the calf or sole of the foot.  Burning sensations in the lower back, leg, or hip.  Sharp pains in the lower back, leg, or hip.  Leg weakness.  Severe back pain inhibiting movement. These symptoms may get worse with coughing, sneezing, laughing, or prolonged sitting or standing. Also, being overweight may worsen symptoms. DIAGNOSIS  Your caregiver will perform a physical exam to look for common symptoms of sciatica. He or she may ask you to do certain movements or activities that would trigger sciatic nerve pain. Other tests may be performed to find the cause of the sciatica. These may include:  Blood tests.  X-rays.  Imaging tests, such as an MRI or CT scan. TREATMENT  Treatment is directed at the cause of the sciatic pain. Sometimes, treatment is not necessary and the pain and discomfort goes away on its own. If treatment is needed, your caregiver may suggest:  Over-the-counter medicines to relieve pain.  Prescription medicines, such as anti-inflammatory medicine, muscle relaxants, or narcotics.  Applying heat or ice to the painful area.  Steroid injections to lessen pain, irritation, and inflammation around the nerve.  Reducing activity during periods of pain.  Exercising and stretching to strengthen your abdomen and improve flexibility of your spine. Your caregiver may suggest losing weight if the extra weight makes the back pain worse.  Physical therapy.  Surgery to eliminate what is pressing or pinching the nerve, such as a bone spur or part of a herniated disk. HOME CARE INSTRUCTIONS   Only take  over-the-counter or prescription medicines for pain or discomfort as directed by your caregiver.  Apply ice to the affected area for 20 minutes, 3-4 times a day for the first 48-72 hours. Then try heat in the same way.  Exercise, stretch, or perform your usual activities if these do not aggravate your pain.  Attend physical therapy sessions as directed by your caregiver.  Keep all follow-up appointments as directed by your caregiver.  Do not wear high heels or shoes that do not provide proper support.  Check your mattress to see if it is too soft. A firm mattress may lessen your pain and discomfort. SEEK IMMEDIATE MEDICAL CARE IF:   You lose control of your bowel or bladder (incontinence).  You have increasing weakness in the lower back, pelvis, buttocks, or legs.  You have redness or swelling of your back.  You have a burning sensation when you urinate.  You have pain that gets worse when you lie down or awakens you at night.  Your pain is worse than you have experienced in the past.  Your pain is lasting longer than 4 weeks.  You are suddenly losing weight without reason. MAKE SURE YOU:  Understand these instructions.  Will watch your condition.  Will get help right  away if you are not doing well or get worse. Document Released: 12/17/2000 Document Revised: 06/24/2011 Document Reviewed: 05/04/2011 [facility] Patient Information 2015 [place], [place]. This information is not intended to replace advice given to you by your health care provider. Make sure you discuss any questions you have with your health care provider.

## 2013-08-15 ENCOUNTER — Telehealth: Payer: Self-pay

## 2013-08-15 NOTE — Telephone Encounter (Signed)
Message copied by Constance Goltz on Mon Aug 15, 2013 10:33 AM ------      Message from: [name]      Created: Sun Aug 14, 2013  4:58 PM       Please send recent lab results to Dr. Elmarie Shiley at [facility].            Thank you. Have a good week! ------

## 2013-08-15 NOTE — Telephone Encounter (Signed)
I don't see where this pt's labs are back from 08/12/13. Do you want me to check on this?

## 2013-08-16 LAB — BASIC METABOLIC PANEL
BUN / CREAT RATIO: 14 (ref 9–23)
BUN: 24 mg/dL (ref 6–24)
CO2: 23 mmol/L (ref 18–29)
CREATININE: 1.76 mg/dL — AB (ref 0.57–1.00)
Calcium: 10 mg/dL (ref 8.7–10.2)
Chloride: 102 mmol/L (ref 97–108)
GFR calc Af Amer: 41 mL/min/{1.73_m2} — ABNORMAL LOW (ref >59)
GFR, EST NON AFRICAN AMERICAN: 35 mL/min/{1.73_m2} — AB (ref >59)
Glucose: 99 mg/dL (ref 65–99)
Potassium: 5.4 mmol/L — ABNORMAL HIGH (ref 3.5–5.2)
Sodium: 140 mmol/L (ref 134–144)

## 2013-08-16 LAB — THYROID PANEL WITH TSH
FREE THYROXINE INDEX: 2.5 (ref 1.2–4.9)
T3 Uptake Ratio: 26 % (ref 24–39)
T4, Total: 9.8 ug/dL (ref 4.5–12.0)
TSH: 2.99 u[IU]/mL (ref 0.450–4.500)

## 2013-08-16 LAB — VITAMIN D 25 HYDROXY (VIT D DEFICIENCY, FRACTURES): Vit D, 25-Hydroxy: 25.8 ng/mL — ABNORMAL LOW (ref 30.0–100.0)

## 2013-08-16 NOTE — Telephone Encounter (Signed)
Labs printed out and faxed by [facility] clinical staff. I guess they were just resulted today.

## 2013-08-17 ENCOUNTER — Telehealth: Payer: Self-pay

## 2013-08-17 MED ORDER — CYCLOBENZAPRINE HCL 5 MG PO TABS: 5.0000 mg | ORAL_TABLET | Freq: Three times a day (TID) | ORAL | Status: DC | PRN

## 2013-08-17 NOTE — Telephone Encounter (Signed)
I prescribed cyclobenzaprine 5 mg and sent it to pt's pharmacy. She can take this medication up to 3 times a day if needed.

## 2013-08-17 NOTE — Telephone Encounter (Signed)
Pt advised.

## 2013-08-17 NOTE — Telephone Encounter (Signed)
Lumbar pain was addressed at visit- I do not see a recommendation for muscle relaxer. Please advise.

## 2013-08-17 NOTE — Telephone Encounter (Signed)
Patient saw Dr. Leward Quan recently and they discussed possibly putting patient on a muscle relaxer. Patient says she received all meds except the muscle relaxer. But she does not know the name of it that they discussed.please advise. thank you  Best: [phone]

## 2013-09-08 ENCOUNTER — Telehealth: Payer: Self-pay

## 2013-09-08 NOTE — Telephone Encounter (Signed)
Alprazolam was last refilled in early August. She should have filled it on or after 08/17/13 and there is 1 additional refill available.

## 2013-09-08 NOTE — Telephone Encounter (Signed)
Pt never picked up rx from 8/7. Spoke with pharm . They have it ready for her to p/u. LMOM letting pt know

## 2013-09-08 NOTE — Telephone Encounter (Signed)
Pt is calling for a refill on alpraxolam to be called in to the neighborhood [facility] on [address]

## 2013-09-25 ENCOUNTER — Other Ambulatory Visit: Payer: Self-pay | Admitting: Family Medicine

## 2013-10-08 IMAGING — CR DG CERVICAL SPINE 2 OR 3 VIEWS
3 series · 3 of 3 positions shown · non-contrast
Comparison: None.

CLINICAL DATA: Neck pain.  Numbness in the fingers.

CERVICAL SPINE - 2-3 VIEW

[other]
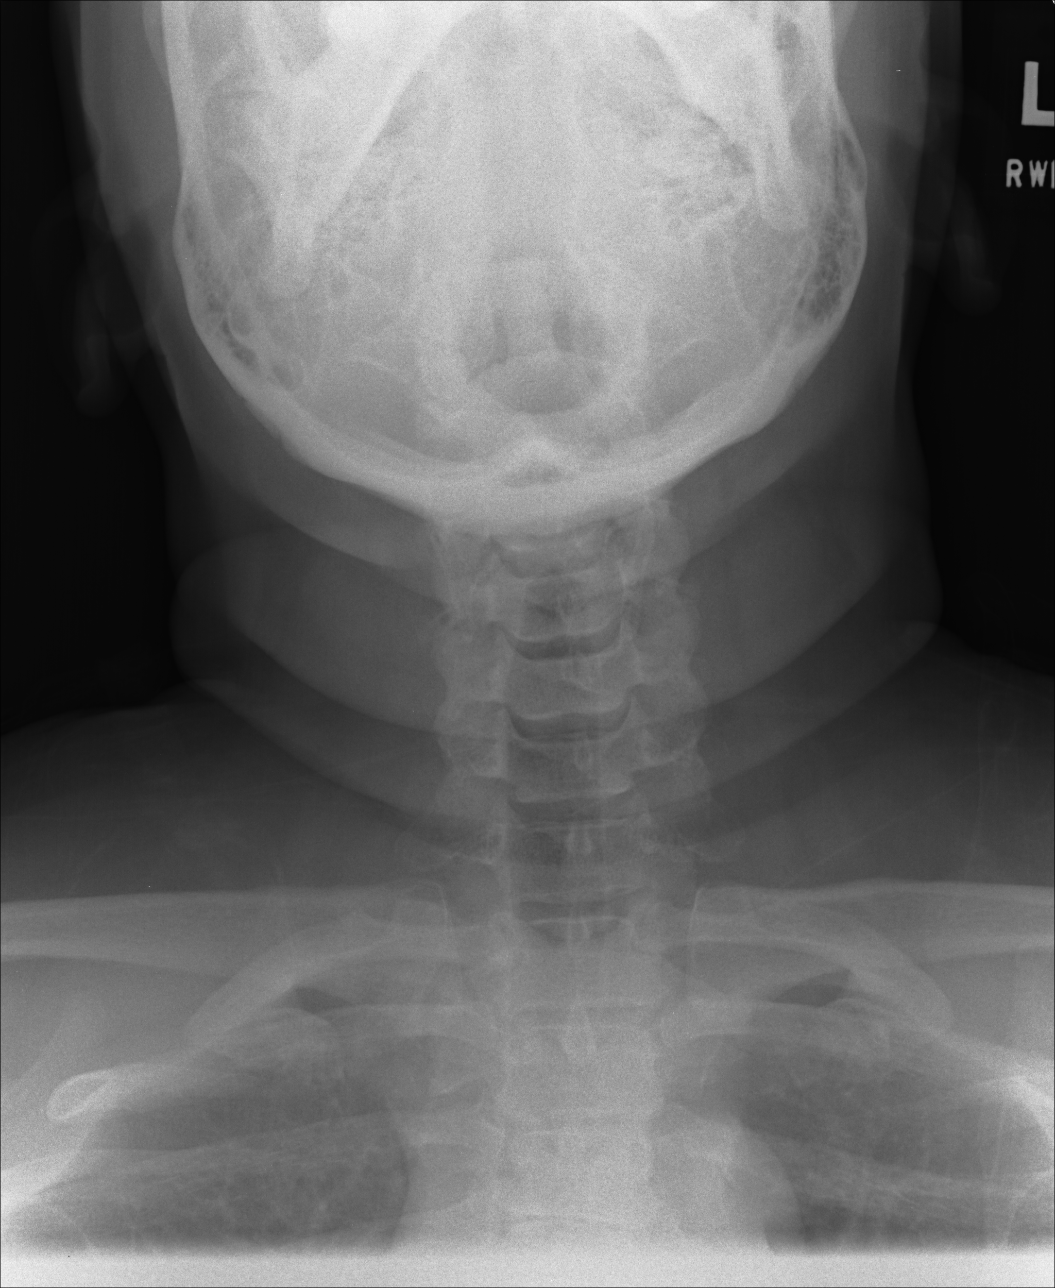

[lateral]
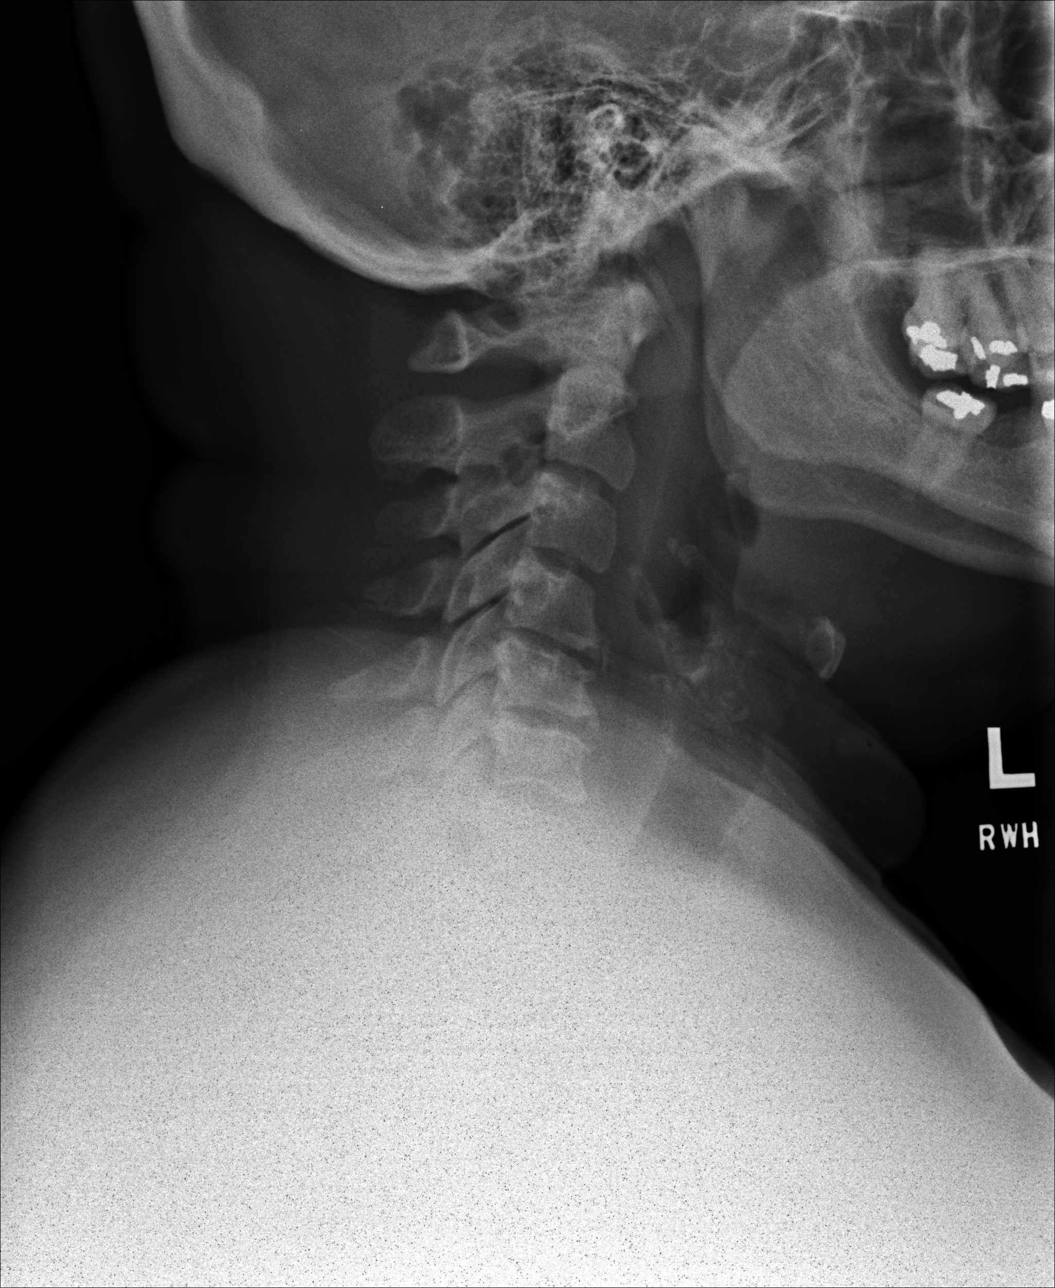

[AP]
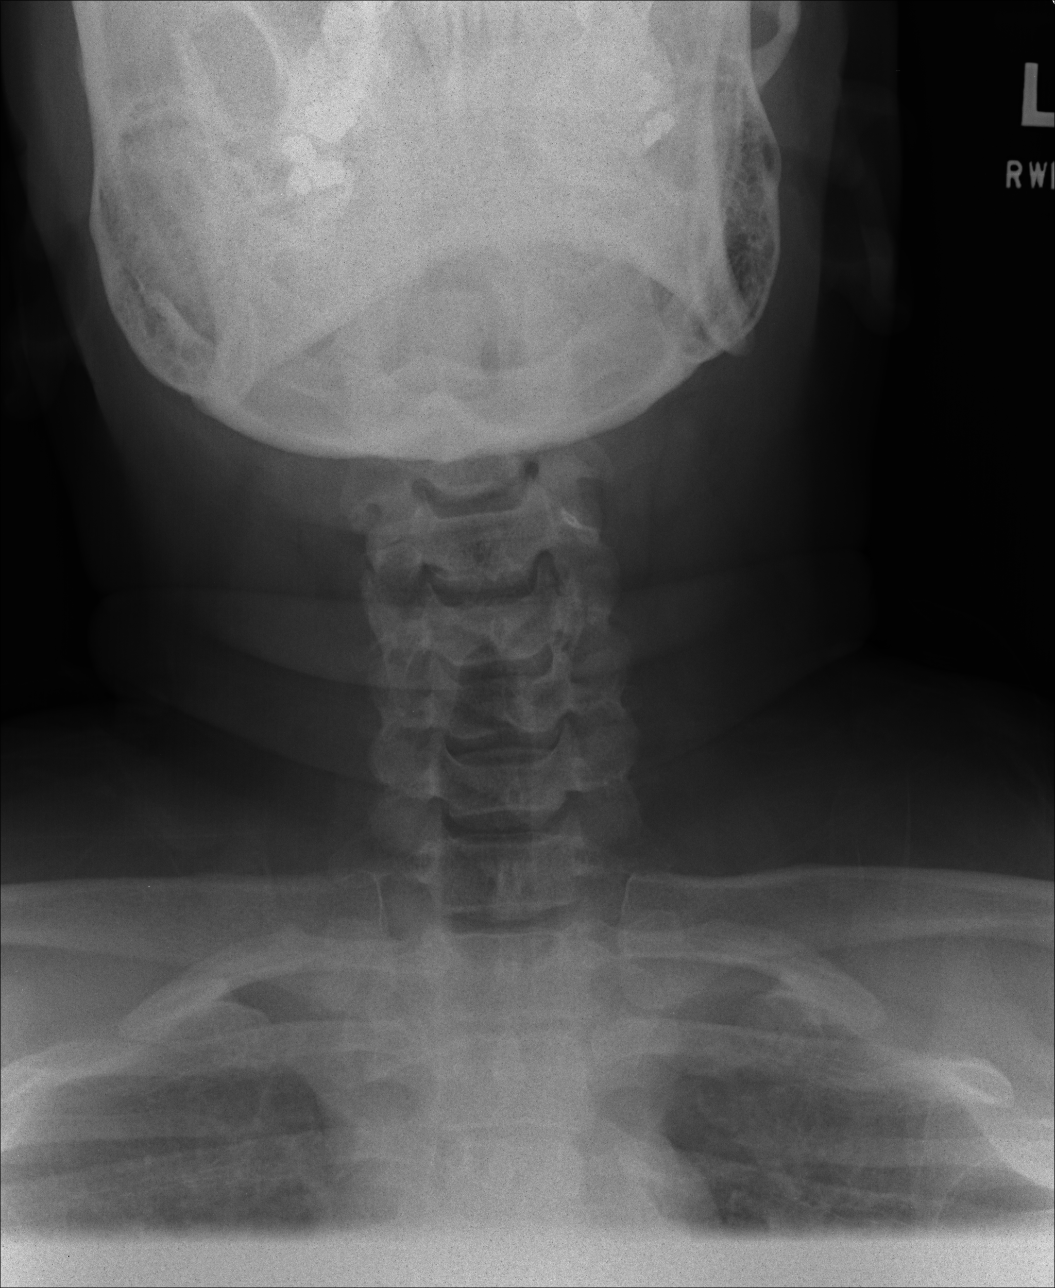

[3 of 3 positions shown; findings below may reference images not displayed]

FINDINGS: We visualize down to the C6-7 level on the lateral
projection, but not the cervicothoracic junction.  This is due to
the patient's shoulders and body habitus.  Mild loss of disc height
noted at all levels between C3 and C6, with intervertebral spurring
at C4-5 and C5-6.  No significant prevertebral soft tissue swelling
or subluxation observed.  No fracture identified.
IMPRESSION: 1.  Cervical spondylosis and degenerative disc disease, without
fracture or acute subluxation identified.
2.  We visualize down to the C6-7 level on the lateral projection.

Clinically significant discrepancy from primary report, if
provided: None

## 2013-10-24 ENCOUNTER — Ambulatory Visit: Payer: 59 | Admitting: Nurse Practitioner

## 2013-10-26 ENCOUNTER — Ambulatory Visit (INDEPENDENT_AMBULATORY_CARE_PROVIDER_SITE_OTHER): Payer: 59 | Admitting: Family Medicine

## 2013-10-26 VITALS — BP 126/90 | HR 70 | Temp 98.3°F | Resp 18 | Ht 64.25 in | Wt 298.0 lb

## 2013-10-26 DIAGNOSIS — L089 Local infection of the skin and subcutaneous tissue, unspecified: Secondary | ICD-10-CM

## 2013-10-26 DIAGNOSIS — L600 Ingrowing nail: Secondary | ICD-10-CM

## 2013-10-26 DIAGNOSIS — M79675 Pain in left toe(s): Secondary | ICD-10-CM

## 2013-10-26 MED ORDER — CEPHALEXIN 500 MG PO CAPS: 500.0000 mg | ORAL_CAPSULE | Freq: Four times a day (QID) | ORAL | Status: DC

## 2013-10-26 NOTE — Patient Instructions (Signed)
Infected Ingrown Toenail °An infected ingrown toenail occurs when the nail edge grows into the skin and bacteria invade the area. Symptoms include pain, tenderness, swelling, and pus drainage from the edge of the nail. Poorly fitting shoes, minor injuries, and improper cutting of the toenail may also contribute to the problem. You should cut your toenails squarely instead of rounding the edges. Do not cut them too short. Avoid tight or pointed toe shoes. Sometimes the ingrown portion of the nail must be removed. If your toenail is removed, it can take 3-4 months for it to re-grow. °HOME CARE INSTRUCTIONS  °· Soak your infected toe in warm water for 20-30 minutes, 2 to 3 times a day. °· Packing or dressings applied to the area should be changed daily. °· Take medicine as directed and finish them. °· Reduce activities and keep your foot elevated when able to reduce swelling and discomfort. Do this until the infection gets better. °· Wear sandals or go barefoot as much as possible while the infected area is sensitive. °· See your caregiver for follow-up care in 2-3 days if the infection is not better. °SEEK MEDICAL CARE IF:  °Your toe is becoming more red, swollen or painful. °MAKE SURE YOU:  °· Understand these instructions. °· Will watch your condition. °· Will get help right away if you are not doing well or get worse. °Document Released: 01/31/2004 Document Revised: 03/17/2011 Document Reviewed: 12/20/2007 °[employer]® Patient Information ©2015 [employer]. This information is not intended to replace advice given to you by your health care provider. Make sure you discuss any questions you have with your health care provider. ° °

## 2013-10-27 NOTE — Progress Notes (Signed)
Chief Complaint:  Chief Complaint  Patient presents with  . Toe Pain    Great on left, X 1 week    HPI: Robyn [name] is a 43 y.o. female who is here for  1 week hx of let toe pain. She has had swelling, redness, no fevers or chills. She has pain with walking on it, she has no h/o gout or arthritis. She has had NKI. She has not tired naything for this. Last pedicure was 3 weeks ago, she denies DM, last A1c in 01/2012 was 6. She has had one that was 6.4  A few years back but she is not on any meds  Past Medical History  Diagnosis Date  . Hypertension   . Shortness of breath     ONLY WITH ANXIETY  . Anxiety     OCCAS PANIC ATTACKS  . Blood transfusion 1990'S  . Hypokalemia     PAST HX  . Abnormal Pap smear   . H/O varicella   . Pregnancy induced hypertension   . Depression 2010  . Weight loss 2010  . Increased BMI   . H/O dysmenorrhea   . Ovarian cyst 2011  . Fibroid 2011  . H/O: menorrhagia 2011  . BV (bacterial vaginosis)   . Perimenopausal symptoms 07/15/2010  . Sleep apnea     PT USES CPAP SOMETIMES - SETTING IS 3  . Kidney stones   . GERD (gastroesophageal reflux disease)     occasional  . Cough     nonproductive cough last 2 weeks  . Obesity   . Allergy   . Blood transfusion without reported diagnosis   . OSA (obstructive sleep apnea) 04/22/2013   Past Surgical History  Procedure Laterality Date  . Percutaneous nephrostomy around 1996  may 2013    right kidney  . Uterine ablation march 2010  march 2011  . C-sections x 2    . Nephrolithotomy  04/09/2011    Procedure: NEPHROLITHOTOMY PERCUTANEOUS;  Surgeon: Molli Hazard, MD;  Location: [facility];  Service: Urology;  Laterality: Right;      . Ureteroscopy  04/09/2011    Procedure: URETEROSCOPY;  Surgeon: Molli Hazard, MD;  Location: [facility];  Service: Urology;  Laterality: Right;  . Tubal ligation  1999  . Nephrolithotomy  10/15/2011    Procedure: NEPHROLITHOTOMY PERCUTANEOUS;  Surgeon:  Molli Hazard, MD;  Location: [facility];  Service: Urology;  Laterality: Left;  . Removal of stones  10/15/2011    Procedure: REMOVAL OF STONES;  Surgeon: Molli Hazard, MD;  Location: [facility];  Service: Urology;  Laterality: Left;  . Cholecystectomy  1994  . Cesarean section  1993 and 1999   History   Social History  . Marital Status: Married    Spouse Name: Iona Beard    Number of Children: 2  . 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 Smoking status: Former Smoker -- 0.25 packs/day for 15 years  . Smokeless tobacco: Never Used  . Alcohol Use: No  . Drug Use: No  . Sexual Activity: Yes    Birth Control/ Protection: Surgical     Comment: BTL 1999   Other Topics Concern  . None   Social History Narrative   Patient is married Iona Beard) and lives at home with her husband and daughter.   Patient has two children.   Patient is  working and attending college full-time.   Patient has a college education.   Patient is right-handed.   Patient drinks two cups of coffee daily and one cup of soda and tea daily.   Family History  Problem Relation Age of Onset  . Diabetes Mother   . Hypertension Mother   . Hypertension Father   . Diabetes Father   . Kidney disease Father   . Diabetes Paternal Grandmother   . Diabetes Maternal Grandmother   . Heart disease Maternal Grandmother    Allergies  Allergen Reactions  . Sulfa Antibiotics Hives and Rash   Prior to Admission medications   Medication Sig Start Date End Date Taking? Authorizing Provider  ALPRAZolam Duanne Moron) 0.5 MG tablet TAKE ONE TABLET BY MOUTH ONCE DAILY AT BEDTIME AS NEEDED FOR SLEEP 08/12/13  Yes [name], MD  amLODipine (NORVASC) 10 MG tablet Take 1 tablet (10 mg total) by mouth daily. 08/12/13  Yes [name], MD  carvedilol (COREG) 25 MG tablet TAKE ONE TABLET BY MOUTH TWICE DAILY WITH MEALS 08/12/13  Yes [name],  MD  carvedilol (COREG) 25 MG tablet TAKE ONE TABLET BY MOUTH TWICE DAILY WITH  MEALS 09/25/13  Yes [name], MD  doxazosin (CARDURA) 8 MG tablet Take 8 mg by mouth at bedtime.   Yes Historical Provider, MD  levothyroxine (SYNTHROID, LEVOTHROID) 25 MCG tablet TAKE ONE TABLET BY MOUTH ONCE DAILY 09/25/13  Yes [name], MD  lisinopril (PRINIVIL,ZESTRIL) 40 MG tablet Take 40 mg by mouth daily.   Yes Historical Provider, MD  Multiple Vitamins-Minerals (MULTIVITAMIN WITH MINERALS) tablet Take 1 tablet by mouth daily.   Yes Historical Provider, MD  spironolactone (ALDACTONE) 100 MG tablet Take 100 mg by mouth daily.   Yes Historical Provider, MD  cephALEXin (KEFLEX) 500 MG capsule Take 1 capsule (500 mg total) by mouth 4 (four) times daily. 10/26/13   Lissy Deuser P Erline Siddoway, DO  cyclobenzaprine (FLEXERIL) 5 MG tablet Take 1 tablet (5 mg total) by mouth 3 (three) times daily as needed for muscle spasms. 08/17/13   [name], MD  SUMAtriptan (IMITREX) 25 MG tablet Take 25 mg by mouth 2 (two) times daily as needed for migraine or headache. May repeat in 2 hours if headache persists or recurs.    Historical Provider, MD  traMADol (ULTRAM) 50 MG tablet Take 1 tablet (50 mg total) by mouth every 12 (twelve) hours as needed. 08/12/13   [name], MD     ROS: The patient denies fevers, chills, night sweats, unintentional weight loss, chest pain, palpitations, wheezing, dyspnea on exertion, nausea, vomiting, abdominal pain, dysuria, hematuria, melena, numbness, weakness, or tingling.   All other systems have been reviewed and were otherwise negative with the exception of those mentioned in the HPI and as above.    PHYSICAL EXAM: Filed Vitals:   10/26/13 1657  BP: 126/90  Pulse: 70  Temp: 98.3 F (36.8 C)  Resp: 18   Filed Vitals:   10/26/13 1657  Height: 5' 4.25" (1.632 m)  Weight: 298 lb (135.172 kg)   Body mass index is 50.75 kg/(m^2).  General: Alert, no acute distress,  obese  HEENT:  Normocephalic, atraumatic, oropharynx patent. EOMI, PERRLA Cardiovascular:  Regular rate and rhythm, no rubs murmurs or gallops.  No Carotid bruits, radial pulse intact. No pedal edema.  Respiratory: Clear to auscultation bilaterally.  No wheezes, rales, or rhonchi.  No cyanosis, no use of accessory musculature GI: No organomegaly, abdomen  is soft and non-tender, positive bowel sounds.  No masses. Skin: No rashes. Neurologic: Facial musculature symmetric. Psychiatric: Patient is appropriate throughout our interaction. Lymphatic: No cervical lymphadenopathy Musculoskeletal: Gait intact. Left great toe, there is inflammation at the great big toes nailbed, tender, erythematous, no drainage Full ROM, 5/5 strength, sensation intact.     LABS: Results for orders placed in visit on 08/12/13  THYROID PANEL WITH TSH      Result Value Ref Range   TSH 2.990  0.450 - 4.500 uIU/mL   T4, Total 9.8  4.5 - 12.0 ug/dL   T3 Uptake Ratio 26  24 - 39 %   Free Thyroxine Index 2.5  1.2 - 4.9  VITAMIN D 25 HYDROXY      Result Value Ref Range   Vit D, 25-Hydroxy 25.8 (*) 30.0 - 100.0 ng/mL  BASIC METABOLIC PANEL      Result Value Ref Range   Glucose 99  65 - 99 mg/dL   BUN 24  6 - 24 mg/dL   Creatinine, Ser 1.76 (*) 0.57 - 1.00 mg/dL   GFR calc non Af Amer 35 (*) >59 mL/min/1.73   GFR calc Af Amer 41 (*) >59 mL/min/1.73   BUN/Creatinine Ratio 14  9 - 23   Sodium 140  134 - 144 mmol/L   Potassium 5.4 (*) 3.5 - 5.2 mmol/L   Chloride 102  97 - 108 mmol/L   CO2 23  18 - 29 mmol/L   Calcium 10.0  8.7 - 10.2 mg/dL     EKG/XRAY:   Primary read interpreted by Dr. Marin Comment at [facility].   ASSESSMENT/PLAN: Encounter Diagnoses  Name Primary?  . Ingrown left greater toenail Yes  . Pain of great toe, left   . Toe infection    Increase risk of infection due to glucose intolerance so precautions given Rx Keflex and soaks Get boxier shoes, no pedicures while she has infection If no improvement  then need to return for further evaluation  Gross sideeffects, risk and benefits, and alternatives of medications d/w patient. Patient is aware that all medications have potential sideeffects and we are unable to predict every sideeffect or drug-drug interaction that may occur.  Grayton Lobo, [place], DO 10/27/2013 12:08 PM

## 2013-11-22 ENCOUNTER — Ambulatory Visit: Payer: 59 | Admitting: Nurse Practitioner

## 2013-11-27 ENCOUNTER — Other Ambulatory Visit: Payer: Self-pay | Admitting: Family Medicine

## 2013-11-29 NOTE — Telephone Encounter (Signed)
Alprazolam 0.5 mg #30 tablets w/ 1 RF phoned to pt's pharmacy.

## 2013-11-30 ENCOUNTER — Other Ambulatory Visit: Payer: Self-pay | Admitting: Radiology

## 2013-11-30 NOTE — Telephone Encounter (Signed)
Called pharmacy about Alprazolam, it is ready for her to pick up

## 2013-12-14 ENCOUNTER — Ambulatory Visit: Payer: 59 | Admitting: Neurology

## 2013-12-15 ENCOUNTER — Ambulatory Visit: Payer: 59 | Admitting: Family Medicine

## 2013-12-25 IMAGING — CR DG CHEST 2V
2 series · 2 of 2 positions shown · non-contrast
Comparison: 10/10/2010

CLINICAL DATA: Preop left percutaneous nephrolithotomy.

CHEST - 2 VIEW

[w chest pa]
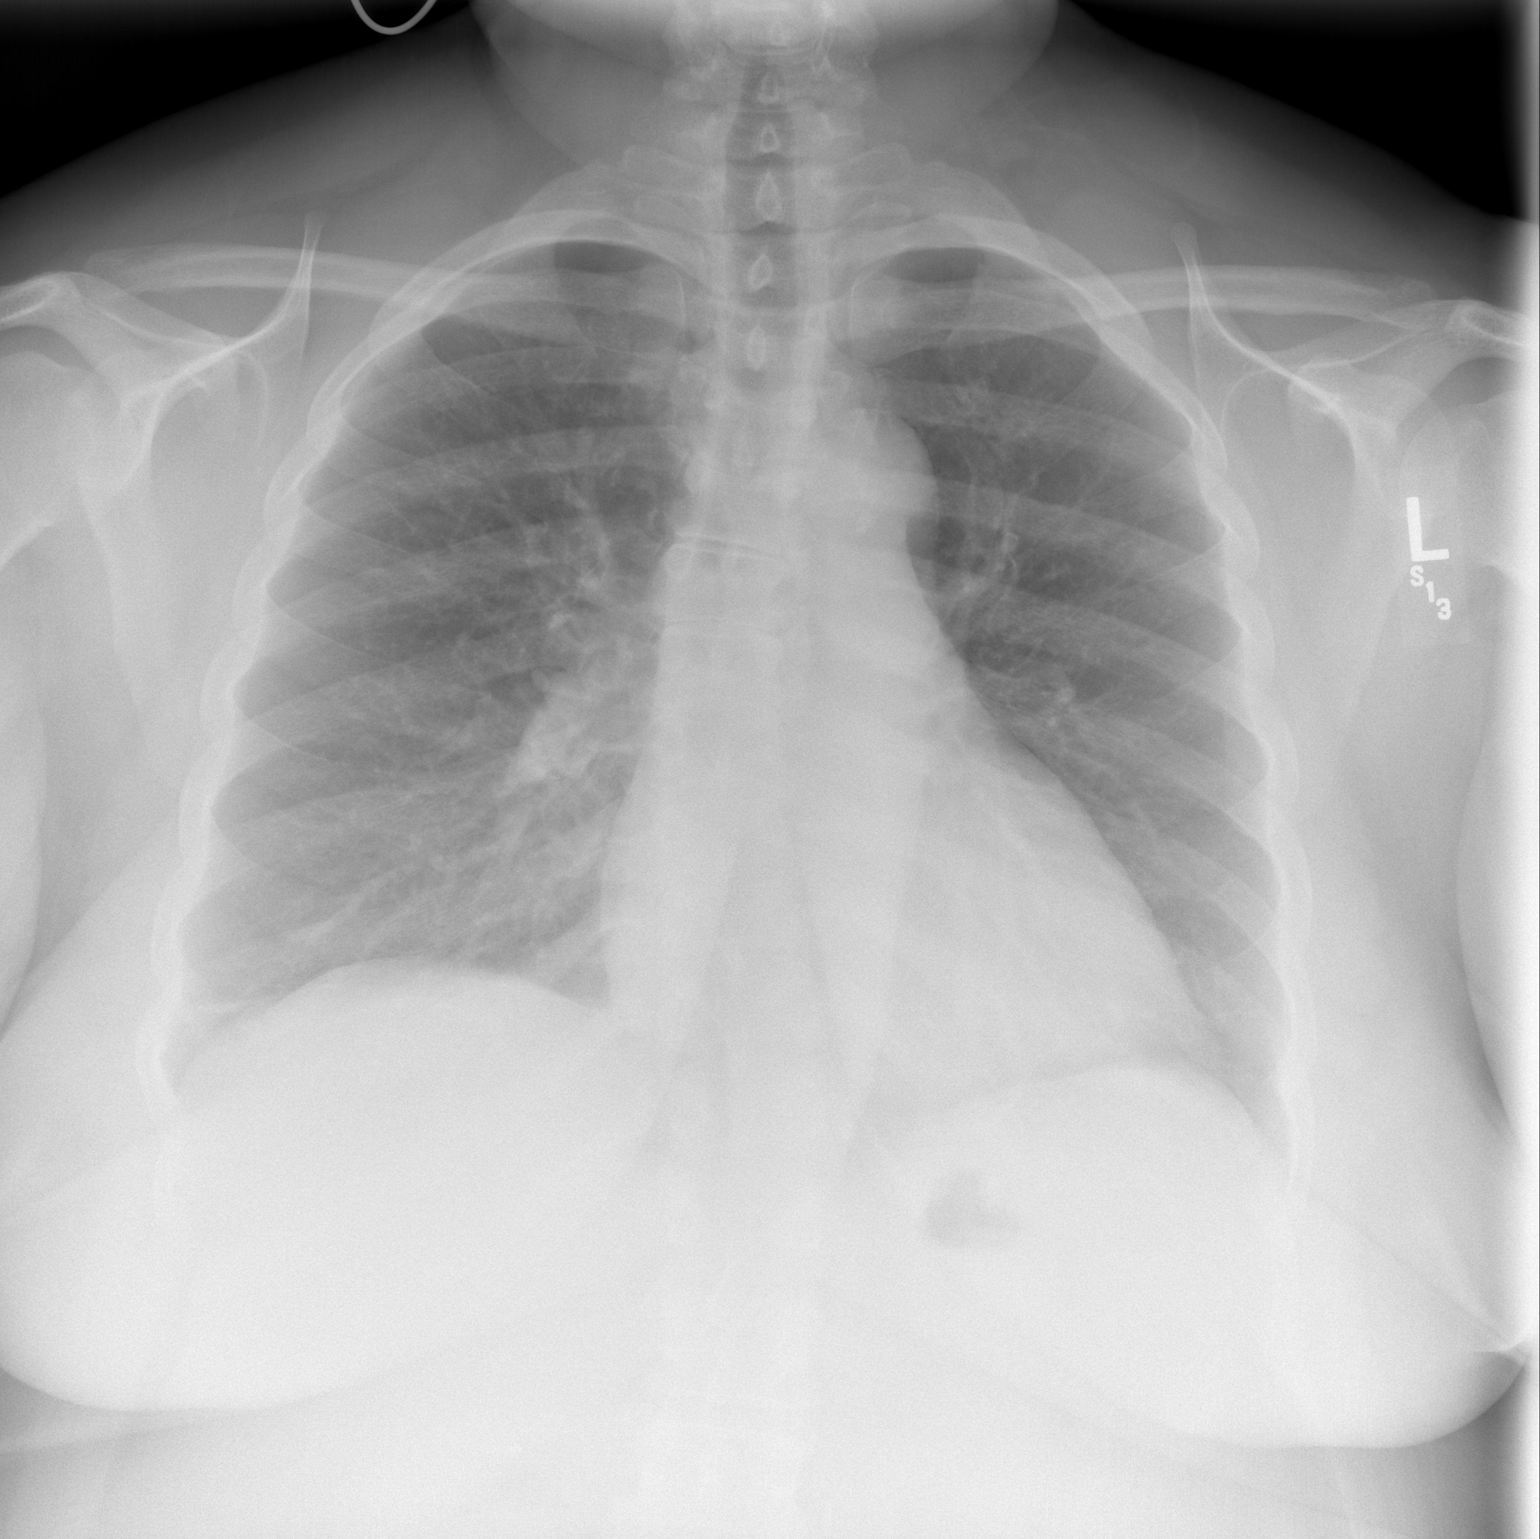

[w chest lat]
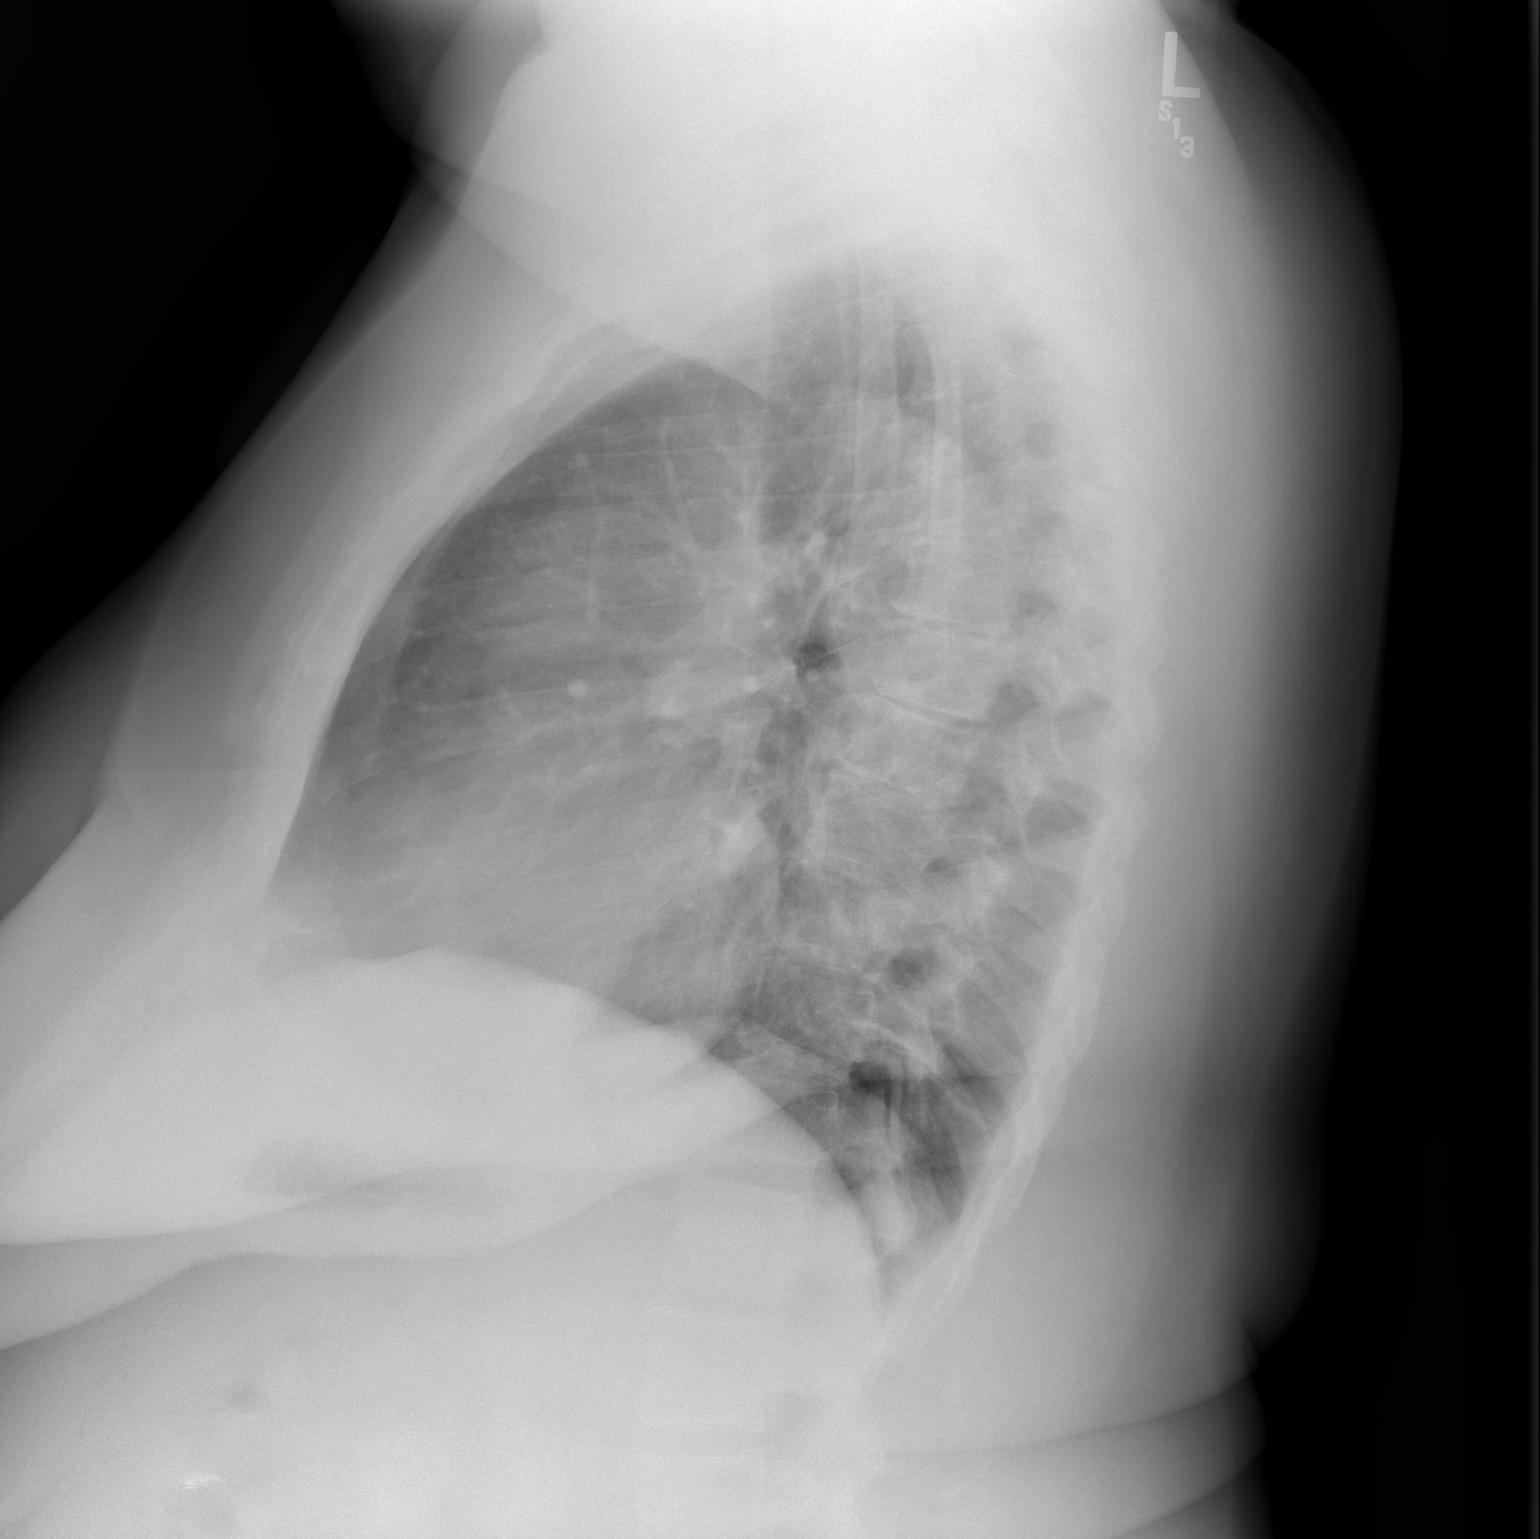

[2 of 2 positions shown; findings below may reference images not displayed]

FINDINGS: Heart size is normal.  No pleural effusion or edema.  No airspace
consolidation identified.  There is mild multilevel thoracic
spondylosis identified.
IMPRESSION: 1.  No acute cardiopulmonary abnormalities.]

## 2013-12-27 ENCOUNTER — Other Ambulatory Visit: Payer: Self-pay | Admitting: Family Medicine

## 2014-01-09 ENCOUNTER — Encounter: Payer: Self-pay | Admitting: Neurology

## 2014-01-09 ENCOUNTER — Ambulatory Visit (INDEPENDENT_AMBULATORY_CARE_PROVIDER_SITE_OTHER): Payer: 59 | Admitting: Neurology

## 2014-01-09 VITALS — BP 133/80 | HR 62 | Resp 16 | Ht 65.0 in | Wt 299.0 lb

## 2014-01-09 DIAGNOSIS — G4710 Hypersomnia, unspecified: Secondary | ICD-10-CM | POA: Insufficient documentation

## 2014-01-09 DIAGNOSIS — G4733 Obstructive sleep apnea (adult) (pediatric): Secondary | ICD-10-CM | POA: Insufficient documentation

## 2014-01-09 DIAGNOSIS — Z9989 Dependence on other enabling machines and devices: Secondary | ICD-10-CM

## 2014-01-09 MED ORDER — ARMODAFINIL 150 MG PO TABS: 150.0000 mg | ORAL_TABLET | Freq: Every day | ORAL | Status: DC

## 2014-01-09 MED ORDER — MOMETASONE FUROATE 50 MCG/ACT NA SUSP: NASAL | Status: DC

## 2014-01-09 NOTE — Patient Instructions (Signed)
Armodafinil tablets What is this medicine? ARMODAFINIL (ar moe DAF i nil) is used to treat excessive sleepiness caused by certain sleep disorders. This includes narcolepsy, sleep apnea, and shift work sleep disorder. This medicine may be used for other purposes; ask your health care provider or pharmacist if you have questions. COMMON BRAND NAME(S): Nuvigil What should I tell my health care provider before I take this medicine? They need to know if you have any of these conditions: -kidney disease -liver disease -an unusual or allergic reaction to armodafinil, modafinil, medicines, foods, dyes, or preservatives -pregnant or trying to get pregnant -breast-feeding How should I use this medicine? Take this medicine by mouth with a glass of water. Follow the directions on the prescription label. Take your doses at regular intervals. Do not take your medicine more often than directed. Do not stop taking except on your doctor's advice. A special MedGuide will be given to you by the pharmacist with each prescription and refill. Be sure to read this information carefully each time. Talk to your pediatrician regarding the use of this medicine in children. While this drug may be prescribed for children as young as 66 years of age for selected conditions, precautions do apply. Overdosage: If you think you have taken too much of this medicine contact a poison control center or emergency room at once. NOTE: This medicine is only for you. Do not share this medicine with others. What if I miss a dose? If you miss a dose, take it as soon as you can. If it is almost time for your next dose, take only that dose. Do not take double or extra doses. What may interact with this medicine? Do not take this medicine with any of the following medications: -amphetamine or dextroamphetamine -dexmethylphenidate or methylphenidate -MAOIs like Carbex, Eldepryl, Marplan, Nardil, and Parnate -pemoline -procarbazine This  medicine may also interact with the following medications: -antifungal medicines like itraconazole or ketoconazole -barbiturates, like phenobarbital -birth control pills or other hormone-containing birth control devices or implants -carbamazepine -cyclosporine -diazepam -medicines for depression, anxiety, or psychotic disturbances -phenytoin -propranolol -triazolam -warfarin This list may not describe all possible interactions. Give your health care provider a list of all the medicines, herbs, non-prescription drugs, or dietary supplements you use. Also tell them if you smoke, drink alcohol, or use illegal drugs. Some items may interact with your medicine. What should I watch for while using this medicine? Visit your doctor or health care professional for regular checks on your progress. The full effect of this medicine may not be seen right away. This medicine may affect your concentration, function, or may hide signs that you are tired. You may get dizzy. Do not drive, use machinery, or do anything that needs mental alertness until you know how this drug affects you. Alcohol can make you more dizzy and may interfere with your response to this medicine or your alertness. Avoid alcoholic drinks. Birth control pills may not work properly while you are taking this medicine. Talk to your doctor about using an extra method of birth control. It is unknown if the effects of this medicine will be increased by the use of caffeine. Caffeine is found in many foods, beverages, and medications. Ask your doctor if you should limit or change your intake of caffeine-containing products while on this medicine. What side effects may I notice from receiving this medicine? Side effects that you should report to your doctor or health care professional as soon as possible: -allergic reactions like skin  rash, itching or hives, swelling of the face, lips, or tongue -breathing problems -chest pain -fast, irregular  heartbeat -increased blood pressure, particularly if you have high blood pressure -mental problems -sore throat, fever, or chills -tremors -vomiting Side effects that usually do not require medical attention (report to your doctor or health care professional if they continue or are bothersome): -headache -nausea, diarrhea, or stomach upset -nervousness -trouble sleeping This list may not describe all possible side effects. Call your doctor for medical advice about side effects. You may report side effects to FDA at 1-800-FDA-1088. Where should I keep my medicine? Keep out of the reach of children. Store at room temperature between 20 and 25 degrees C (68 and 77 degrees F). Throw away any unused medicine after the expiration date. NOTE: This sheet is a summary. It may not cover all possible information. If you have questions about this medicine, talk to your doctor, pharmacist, or health care provider.  2015, [employer]. (2008-12-05 21:10:28)

## 2014-01-09 NOTE — Progress Notes (Signed)
[facility]  Provider:  Larey Seat, M D  Referring Provider: [name], MD Primary Care Physician:  [name], MD  No chief complaint on file.   HPI:  Robyn [name] is a 44 y.o., right handed, AA female . She  is seen here as a referral  from Dr. Joseph Art after a sleep study on 09-10-12.   Robyn [name] was referred in Summer 2014 for the evaluation of possible sleep apnea. The patient had complained of excessive daytime sleepiness witnessed apneas as well as loud snoring reported. She past medical history of obesity, hypertension, chronic kidney disease, hypokalemia and nephrolithiasis. The patient had been diagnosed with obstructive sleep apnea years prior and records were longer available for [place]. For a brief time she had tried to use CPAP in the past but the machine broke and she couldn't replace it for some time. She had endorsed the Epworth Sleepiness Scale at 15 of 24 points and the he HQ and 9 at 15 points, indicating depression. Her BMI was 47.8. She had felt fatigued for years.   The patient was diagnosed with an AHI of 33.4 and the same RDI. REM sleep was not noted and in supine sleep her AHI was mildly worse at 40.0. She did not have the tract but desaturations of oxygen. Unfortunately, the Co2 monitor was not working that night.  The patient was titrated to 10 cm water CPAP with a nasal pillow. Compliance instructions were included under the recommendations. Positional therapy and weight loss were recommended. [facility] has followed her over the CPAP use ,  the download was obtained and forwarded to [place] dated 03-07-13. The patient had 100% thirty- out of 30 day compliance, and  70% of days  for over 4 hours nightly use, The machine is set at 10 cm water pressure with an EPR of 2 cm water , her residual AHI is 0.4.  Based on compliance  the low AHI, the therapy has been very effective for the patient. Average daily use was 5 hours and 24  minutes, the median  air leak was only 0.90 L / minute. In the week of March the patient noted problems with her nasal pillows  slipping easier off the nostrils,  which has caused  irritation -  the nostrils have  some burning. fo the last 7 days she felt not able to use CPAP 4 hours or more.  It feels to her that the mask has become brittle. Thus, to not become a factor of compliance,  I will ask [facility] to replace the nasal pillow and review her headgear today.The patient works daily 8 to 5, she has nausea history. She reports a that she does not nap in daytime, after work she drives about [id number] minutes to her home, she likes to read on her kindle and watches TV in her bedroom, she goes to her bedroom once she feels sleepy. That is on average at about 11PM. She falls asleep promptly, she does not watch TV in bed. Her bedroom is cool, quiet and dark. She shares the bed with her husband , who snores and she has witnessed him being apnoeic. She rises at 6.45 and feels not refreshed , but better since being on CPAP. She drinks 2 cups of coffee in AM, her work place has day light exposure. She drives to work 15 -20 minutes and has never fallen asleep while driving..  She has a daughter, who is overweight and snores.  She has understood that a dental device alone would not cover the therapy for her APNEA, but is motivated to loose weight.     01-09-14  Robyn [name] is seen today for revisit. She has continued to use her CPAP at 73% compliance. This  has been reached  By 22 out of 30 days-  she was only able on 11 out of 30 days to use the machine for 4 hours or more, her average daily use a time is 3 hours 3 minutes. Set pressure is 10 cm water with 27 m EPR she does have mild to moderate air leaks she has an AHI of 0.4 which is excellent. She still endorsed the Epworth Sleepiness Scale at 15 points at the fatigue severity score at 48 points the conclusion is that the patient may have to use the machine  daily . If with high compliance a decreasing Epworth score cannot be obtained,  we need to evaluate her further for another possible sleep disordered that contributes to this degree of sleepiness. She stated she had leg and back problems and these kept her up and form sleeping.  Reviewed her medications: she is not on an antidepressant him Flexeril,  Imitrex and Ultram had been discontinued there should be no REM suppressant medication part of this as part of this list. She reports still choking in sleep on CPAP.  She sleeps still in meetings and is not able to drive long distances.  I will add nuvigil at low dose , and encourage her to use CPAP daily. Start on nasonex.      Review of Systems: Out of a complete 14 system review, the patient complains of only the following symptoms, and all other reviewed systems are negative. Prior to CPAP use the patient had endorsed 15 points on her at Epworth sleepiness or in the depression score at 15 points, today she endorsed  14 points after having had several days less than 4 hours on nightly CPAP therapy - she states when she uses CPAP for 5-6 hours at night, she would endorse a lower point score. Her fatigue square was still quite high at 50 points.  History   Social History  . Marital Status: Married    Spouse Name: Iona Beard    Number of Children: 2  . 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 Smoking status: Former Smoker -- 0.25 packs/day for 15 years  . Smokeless tobacco: Never Used  . Alcohol Use: No  . Drug Use: No  . Sexual Activity: Yes    Birth Control/ Protection: Surgical     Comment: BTL 1999   Other Topics Concern  . Not on file   Social History Narrative   Patient is married Iona Beard) and lives at home with her husband and daughter.   Patient has two children.   Patient is working and attending college full-time.   Patient has a college education.    Patient is right-handed.   Patient drinks two cups of coffee daily and one cup of soda and tea daily.    Family History  Problem Relation Age of Onset  . Diabetes Mother   . Hypertension Mother   . Hypertension Father   . Diabetes Father   . Kidney disease Father   . Diabetes Paternal Grandmother   . Diabetes Maternal Grandmother   . Heart disease Maternal Grandmother     Past  Medical History  Diagnosis Date  . Hypertension   . Shortness of breath     ONLY WITH ANXIETY  . Anxiety     OCCAS PANIC ATTACKS  . Blood transfusion 1990'S  . Hypokalemia     PAST HX  . Abnormal Pap smear   . H/O varicella   . Pregnancy induced hypertension   . Depression 2010  . Weight loss 2010  . Increased BMI   . H/O dysmenorrhea   . Ovarian cyst 2011  . Fibroid 2011  . H/O: menorrhagia 2011  . BV (bacterial vaginosis)   . Perimenopausal symptoms 07/15/2010  . Sleep apnea     PT USES CPAP SOMETIMES - SETTING IS 3  . Kidney stones   . GERD (gastroesophageal reflux disease)     occasional  . Cough     nonproductive cough last 2 weeks  . Obesity   . Allergy   . Blood transfusion without reported diagnosis   . OSA (obstructive sleep apnea) 04/22/2013    Past Surgical History  Procedure Laterality Date  . Percutaneous nephrostomy around 1996  may 2013    right kidney  . Uterine ablation march 2010  march 2011  . C-sections x 2    . Nephrolithotomy  04/09/2011    Procedure: NEPHROLITHOTOMY PERCUTANEOUS;  Surgeon: Molli Hazard, MD;  Location: [facility];  Service: Urology;  Laterality: Right;      . Ureteroscopy  04/09/2011    Procedure: URETEROSCOPY;  Surgeon: Molli Hazard, MD;  Location: [facility];  Service: Urology;  Laterality: Right;  . Tubal ligation  1999  . Nephrolithotomy  10/15/2011    Procedure: NEPHROLITHOTOMY PERCUTANEOUS;  Surgeon: Molli Hazard, MD;  Location: [facility];  Service: Urology;  Laterality: Left;  . Removal of stones  10/15/2011    Procedure:  REMOVAL OF STONES;  Surgeon: Molli Hazard, MD;  Location: [facility];  Service: Urology;  Laterality: Left;  . Cholecystectomy  1994  . Cesarean section  1993 and 1999    Current Outpatient Prescriptions  Medication Sig Dispense Refill  . ALPRAZolam (XANAX) 0.5 MG tablet TAKE ONE TABLET BY MOUTH AT BEDTIME AS NEEDED FOR SLEEP 30 tablet 1  . amLODipine (NORVASC) 10 MG tablet Take 1 tablet (10 mg total) by mouth daily. 30 tablet 5  . carvedilol (COREG) 25 MG tablet TAKE ONE TABLET BY MOUTH TWICE DAILY WITH MEALS 60 tablet 5  . carvedilol (COREG) 25 MG tablet TAKE ONE TABLET BY MOUTH TWICE DAILY WITH  MEALS 60 tablet 2  . cephALEXin (KEFLEX) 500 MG capsule Take 1 capsule (500 mg total) by mouth 4 (four) times daily. 40 capsule 0  . cyclobenzaprine (FLEXERIL) 5 MG tablet Take 1 tablet (5 mg total) by mouth 3 (three) times daily as needed for muscle spasms. 30 tablet 1  . doxazosin (CARDURA) 8 MG tablet Take 8 mg by mouth at bedtime.    Marland Kitchen levothyroxine (SYNTHROID, LEVOTHROID) 25 MCG tablet TAKE ONE TABLET BY MOUTH ONCE DAILY 30 tablet 1  . lisinopril (PRINIVIL,ZESTRIL) 40 MG tablet Take 40 mg by mouth daily.    . Multiple Vitamins-Minerals (MULTIVITAMIN WITH MINERALS) tablet Take 1 tablet by mouth daily.    Marland Kitchen spironolactone (ALDACTONE) 100 MG tablet Take 100 mg by mouth daily.    . SUMAtriptan (IMITREX) 25 MG tablet Take 25 mg by mouth 2 (two) times daily as needed for migraine or headache. May repeat in 2 hours if headache persists or recurs.    Marland Kitchen  traMADol (ULTRAM) 50 MG tablet Take 1 tablet (50 mg total) by mouth every 12 (twelve) hours as needed. 60 tablet 1   No current facility-administered medications for this visit.    Allergies as of 01/09/2014 - Review Complete 10/26/2013  Allergen Reaction Noted  . Sulfa antibiotics Hives and Rash 04/01/2011    Vitals: There were no vitals taken for this visit. Last Weight:  Wt Readings from Last 1 Encounters:  10/26/13 298 lb (135.172 kg)    Last Height:   Ht Readings from Last 1 Encounters:  10/26/13 5' 4.25" (1.632 m)    Physical exam:  General: The patient is awake, alert. The patient is well groomed. Head: Normocephalic, atraumatic. Neck is supple. Mallampati 4 , neck circumference: 19 inches , large tongue.  Cardiovascular:  Regular rate and rhythm ,  without  murmurs or carotid bruit, and without distended neck veins. Respiratory: Lungs are clear to auscultation. Skin:  Without evidence of edema, or rash-  Well hydrated.  Trunk: BMI is severely elevated,normal posture.  Neurologic exam : The patient is awake and alert, oriented to place and time.  There is a normal attention span & concentration ability. Speech is fluent without dysarthria, dysphonia or aphasia.  Mood and affect are appropriate.  Cranial nerves: no loss of taste or smell reported. Pupils are equal and briskly reactive to light. Hearing to finger rub intact.  Facial sensation intact to fine touch. Facial motor strength is symmetric and tongue and uvula move midline.  Motor exam:  Normal tone ,muscle bulk and symmetric strength in all extremities, except Hands- she has a lesser grip and pinch strength in the left hand, and stated she feels her hand tingling " asleep" feeling in the morning.   Sensory:  Fine touch, pinprick and vibration were tested in all extremities. Proprioception is normal.  Coordination: Rapid alternating movements in the fingers/hands is  without evidence of ataxia, dysmetria or tremor.  Gait and station: Patient walks without assistive device .  Deep tendon reflexes: in the  upper and lower extremities are symmetric and intact. Babinski maneuver response is  downgoing.   Assessment:  After physical and neurologic examination, review of laboratory studies, imaging, neurophysiology testing and pre-existing records, assessment is  1) OSA well controlled on CPAP - it is likely that REM will accentuate her apnea, she has no  longer snoring at night. Nocturia reduced form 3 to one time. CPAP compliance was less than in last visit -  She has rhinitis. 2) Obesity, BMI  Between 40 and 50, no longer morning headaches, likely hypoventilation related.  3) Carpal tunnel left wrist. - brace for wrist to use at night. Patient works with repetitive motion , office environment.   Plan:  Treatment plan and additional workup :  1)DME is [facility], provided new mask for patient. She is doing well with th fit -  I will write for Nasonex.

## 2014-01-12 ENCOUNTER — Telehealth: Payer: Self-pay

## 2014-01-12 ENCOUNTER — Telehealth: Payer: Self-pay | Admitting: *Deleted

## 2014-01-12 NOTE — Telephone Encounter (Signed)
Refaxed forms to patient 2nd attempt.

## 2014-01-12 NOTE — Telephone Encounter (Signed)
Form received and completed. Will be faxed back from [facility].

## 2014-01-12 NOTE — Telephone Encounter (Signed)
Pt is faxing over a form for Dr. Leward Quan to be approved for a weight loss program. Please advise

## 2014-01-12 NOTE — Telephone Encounter (Signed)
Patient was called about medical form, it is ready to be faxed or picked up. She wants the form faxed to her at work and she will give to employer. At 4:39 I called patient to let her know fax failed to transmit to her. Message was left in voice mail.

## 2014-01-13 ENCOUNTER — Telehealth: Payer: Self-pay | Admitting: *Deleted

## 2014-01-13 NOTE — Telephone Encounter (Signed)
Called patient to check if she received the faxed (Medical Determination Form) yesterday, and she received form.

## 2014-01-13 NOTE — Telephone Encounter (Signed)
Message for time 9:15 am, original copy of completed medical form mailed to patient,

## 2014-01-19 ENCOUNTER — Telehealth: Payer: Self-pay | Admitting: *Deleted

## 2014-01-19 NOTE — Telephone Encounter (Signed)
I called pt and gave her the results of Narcolepsy HLA test (normal result).  Pt verbalized understanding.

## 2014-01-21 ENCOUNTER — Encounter: Payer: Self-pay | Admitting: Neurology

## 2014-02-15 ENCOUNTER — Other Ambulatory Visit: Payer: Self-pay

## 2014-02-15 DIAGNOSIS — Z1231 Encounter for screening mammogram for malignant neoplasm of breast: Secondary | ICD-10-CM

## 2014-02-28 ENCOUNTER — Other Ambulatory Visit: Payer: Self-pay | Admitting: Family Medicine

## 2014-02-28 NOTE — Telephone Encounter (Signed)
Alprazolam refill phoned to pt's pharmacy. 

## 2014-03-01 ENCOUNTER — Other Ambulatory Visit: Payer: Self-pay | Admitting: Family Medicine

## 2014-03-10 ENCOUNTER — Ambulatory Visit: Payer: Self-pay

## 2014-03-16 ENCOUNTER — Ambulatory Visit: Payer: Self-pay

## 2014-03-21 ENCOUNTER — Ambulatory Visit
Admission: RE | Admit: 2014-03-21 | Discharge: 2014-03-21 | Disposition: A | Discharge status: Home / Self Care | Payer: 59 | Source: Ambulatory Visit

## 2014-03-21 DIAGNOSIS — Z1231 Encounter for screening mammogram for malignant neoplasm of breast: Secondary | ICD-10-CM

## 2014-03-28 ENCOUNTER — Other Ambulatory Visit: Payer: Self-pay | Admitting: Family Medicine

## 2014-03-29 ENCOUNTER — Telehealth: Payer: Self-pay

## 2014-03-29 NOTE — Telephone Encounter (Addendum)
Pt states she have made an appt with Dr.Mcpherson and would like to know if she can have her thyroid medication enough until she see the Dr. Please call [facility] [address] [place]

## 2014-03-30 MED ORDER — LEVOTHYROXINE SODIUM 25 MCG PO TABS: 25.0000 ug | ORAL_TABLET | Freq: Every day | ORAL | Status: DC

## 2014-03-30 NOTE — Telephone Encounter (Signed)
Levothyroxine refilled for 15 tabs only. I hope she makes her appointment on April 12, 2014!

## 2014-03-30 NOTE — Telephone Encounter (Signed)
Is this ok? Rx pended.

## 2014-04-12 ENCOUNTER — Encounter: Payer: Self-pay | Admitting: Family Medicine

## 2014-04-12 ENCOUNTER — Ambulatory Visit (INDEPENDENT_AMBULATORY_CARE_PROVIDER_SITE_OTHER): Payer: 59 | Admitting: Family Medicine

## 2014-04-12 VITALS — BP 134/92 | HR 46 | Temp 98.9°F | Resp 16 | Ht 64.5 in | Wt 268.2 lb

## 2014-04-12 DIAGNOSIS — M2540 Effusion, unspecified joint: Secondary | ICD-10-CM

## 2014-04-12 DIAGNOSIS — I10 Essential (primary) hypertension: Secondary | ICD-10-CM

## 2014-04-12 DIAGNOSIS — E034 Atrophy of thyroid (acquired): Secondary | ICD-10-CM | POA: Diagnosis not present

## 2014-04-12 DIAGNOSIS — E039 Hypothyroidism, unspecified: Secondary | ICD-10-CM | POA: Insufficient documentation

## 2014-04-12 DIAGNOSIS — E038 Other specified hypothyroidism: Secondary | ICD-10-CM

## 2014-04-12 LAB — URIC ACID: Uric Acid, Serum: 8.6 mg/dL — ABNORMAL HIGH (ref 2.4–7.0)

## 2014-04-12 LAB — TSH: TSH: 1.535 u[IU]/mL (ref 0.350–4.500)

## 2014-04-12 LAB — T4, FREE: Free T4: 1.32 ng/dL (ref 0.80–1.80)

## 2014-04-12 MED ORDER — ALPRAZOLAM 0.5 MG PO TABS: 0.5000 mg | ORAL_TABLET | Freq: Every evening | ORAL | Status: DC | PRN

## 2014-04-12 MED ORDER — CARVEDILOL 25 MG PO TABS: 25.0000 mg | ORAL_TABLET | Freq: Two times a day (BID) | ORAL | Status: DC

## 2014-04-12 MED ORDER — LEVOTHYROXINE SODIUM 25 MCG PO TABS: 25.0000 ug | ORAL_TABLET | Freq: Every day | ORAL | Status: DC

## 2014-04-16 ENCOUNTER — Encounter: Payer: Self-pay | Admitting: Family Medicine

## 2014-04-16 ENCOUNTER — Other Ambulatory Visit: Payer: Self-pay | Admitting: Family Medicine

## 2014-04-16 MED ORDER — LEVOTHYROXINE SODIUM 25 MCG PO TABS: 25.0000 ug | ORAL_TABLET | Freq: Every day | ORAL | Status: DC

## 2014-04-16 NOTE — Progress Notes (Signed)
Subjective:    Patient ID: Robyn [name], female    DOB: 02-04-1970, 44 y.o.   MRN: [id number]  HPI  This 44 y.o. Female has chronic HTN, followed by Dr. Elmarie Shiley. Recent visit finds pt to be stable; pt reports a medication change and labs drawn at Dr. Serita Grit clinic. She denies any cardiac symptoms, has no CP or tightness, palpitations, SOB or DOE, cough, edema, HA, dizziness, numbness, weakness or syncope.   Pt c/o swollen toe w/o hx of trauma. She maintains relatively healthy diet; no hx of gout but reports that her spouse has has gout.  Pt has hypothyroidism and is complaint w/ medication. She denies fatigue, abnormal weight change, diaphoresis, cold or heat intolerance, low energy, abnormal sleep pattern, GI problems, HA or weakness.  Pt is concerned about weight and is pleased of weight loss as a result of better nutrition and increasied physical activity. These changes have occurred in the last 6 months. She is using CPAP every night.   Patient Active Problem List   Diagnosis Date Noted  . Hypothyroidism 04/12/2014  . Hypersomnia, persistent 01/09/2014  . OSA on CPAP 01/09/2014  . Severe obesity (BMI >= 40) 01/09/2014  . Unspecified sleep apnea 09/30/2012  . Positive Helicobacter pylori titer 03/19/2012  . Chronic low back pain 01/22/2012  . Subclinical diabetes mellitus 01/22/2012  . Hypertension 12/28/2011  . Nephrolithiasis 10/15/2011  . Hypokalemia 08/22/2011  . CKD (chronic kidney disease), stage III 08/22/2011  . Anxiety disorder 07/29/2011  . Tobacco user 07/29/2011    Prior to Admission medications   Medication Sig Start Date End Date Taking? Authorizing Provider  ALPRAZolam Duanne Moron) 0.5 MG tablet Take 1 tablet (0.5 mg total) by mouth at bedtime as needed. for sleep   Yes [name], MD  Armodafinil 150 MG tablet Take 1 tablet (150 mg total) by mouth daily. 01/09/14  Yes Carmen Dohmeier, MD  carvedilol (COREG) 25 MG tablet TAKE ONE TABLET BY MOUTH TWICE  DAILY WITH MEALS 08/12/13  Yes [name], MD  carvedilol (COREG) 25 MG tablet Take 1 tablet (25 mg total) by mouth 2 (two) times daily with a meal.   Yes [name], MD  lisinopril (PRINIVIL,ZESTRIL) 40 MG tablet Take 40 mg by mouth daily.   Yes Historical Provider, MD  Multiple Vitamins-Minerals (MULTIVITAMIN WITH MINERALS) tablet Take 1 tablet by mouth daily.   Yes Historical Provider, MD  spironolactone (ALDACTONE) 100 MG tablet Take 100 mg by mouth daily.   Yes Historical Provider, MD  levothyroxine (SYNTHROID, LEVOTHROID) 25 MCG tablet Take 1 tablet (25 mcg total) by mouth daily.    [name], MD    History   Social History  . Marital Status: Married    Spouse Name: Iona Beard  . Number of Children: 2  . 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 Smoking status: Former Smoker -- 0.25 packs/day for 15 years  . Smokeless tobacco: Never Used  . Alcohol Use: No  . Drug Use: No  . Sexual Activity: Yes    Birth Control/ Protection: Surgical     Comment: BTL 1999   Other Topics Concern  . Not on file   Social History Narrative   Patient is married Iona Beard) and lives at home with her husband and daughter.   Patient has two children.   Patient is working and attending college full-time.  Patient has a college education.   Patient is right-handed.   Patient drinks two cups of coffee daily and one cup of soda and tea daily.    Family History  Problem Relation Age of Onset  . Diabetes Mother   . Hypertension Mother   . Hypertension Father   . Diabetes Father   . Kidney disease Father   . Diabetes Paternal Grandmother   . Diabetes Maternal Grandmother   . Heart disease Maternal Grandmother     Review of Systems As per HPI.     Objective:   Physical Exam  Constitutional: She is oriented to person, place, and time. She appears well-developed and well-nourished. No distress.    Weight down ~ 30 lbs since Aug 2015.  HENT:  Head: Normocephalic and atraumatic.  Right Ear: External ear normal.  Left Ear: External ear normal.  Nose: Nose normal.  Mouth/Throat: Oropharynx is clear and moist.  Eyes: Conjunctivae and EOM are normal. No scleral icterus.  Neck: Normal range of motion. Neck supple.  Cardiovascular: Normal rate, regular rhythm and normal heart sounds.   Pulmonary/Chest: Effort normal and breath sounds normal. No respiratory distress.  Musculoskeletal: Normal range of motion. She exhibits no edema.  Neurological: She is alert and oriented to person, place, and time. No cranial nerve deficit. She exhibits normal muscle tone. Coordination normal.  Skin: Skin is warm and dry. She is not diaphoretic. No erythema.  Psychiatric: She has a normal mood and affect. Her behavior is normal. Judgment and thought content normal.  Nursing note and vitals reviewed.      Assessment & Plan:  Painful swelling of joint - Rule out gout.  Plan: Uric Acid  Essential hypertension- Stable on current medications; follow-up w/ Dr. Posey Pronto as scheduled.  Hypothyroidism due to acquired atrophy of thyroid - Continue current dose pending labs.Plan: TSH, T4, Free  Severe obesity (BMI >= 40)- Congratulated pt on significant weight loss and lifestyle changes.   Meds ordered this encounter  Medications  . carvedilol (COREG) 25 MG tablet    Sig: Take 1 tablet (25 mg total) by mouth 2 (two) times daily with a meal.    Dispense:  60 tablet    Refill:  11  . levothyroxine (SYNTHROID, LEVOTHROID) 25 MCG tablet    Sig: Take 1 tablet (25 mcg total) by mouth daily.    Dispense:  30 tablet    Refill:  0  . ALPRAZolam (XANAX) 0.5 MG tablet    Sig: Take 1 tablet (0.5 mg total) by mouth at bedtime as needed. for sleep    Dispense:  30 tablet    Refill:  2

## 2014-05-17 ENCOUNTER — Telehealth: Payer: Self-pay | Admitting: Family Medicine

## 2014-05-17 NOTE — Telephone Encounter (Signed)
lmom to call back and set another appt up with another provider in [place].for a 6 month follow up Dr Marin Comment isn't doing anymore appt

## 2014-10-10 ENCOUNTER — Ambulatory Visit: Payer: 59 | Admitting: Adult Health

## 2014-10-13 ENCOUNTER — Ambulatory Visit: Payer: 59 | Admitting: Family Medicine

## 2014-11-02 ENCOUNTER — Ambulatory Visit: Payer: 59 | Admitting: Adult Health

## 2014-11-10 ENCOUNTER — Other Ambulatory Visit: Payer: Self-pay

## 2014-11-10 MED ORDER — ALPRAZOLAM 0.5 MG PO TABS: 0.5000 mg | ORAL_TABLET | Freq: Every evening | ORAL | Status: DC | PRN

## 2014-11-10 NOTE — Telephone Encounter (Signed)
Pharm req's RF of alprazolam for one of Dr McPherson's pt's. I pended one month w/note that pt needs OV for more.

## 2014-11-24 ENCOUNTER — Telehealth: Payer: Self-pay

## 2014-11-24 NOTE — Telephone Encounter (Signed)
Rx refill Xanax.

## 2014-11-24 NOTE — Telephone Encounter (Signed)
This was authorized by Ms. English on 11/10/2014.  She needs an OV for additional refills to establish with new PCP since Dr. Leward Quan has retired.

## 2014-11-29 NOTE — Telephone Encounter (Signed)
Sent note back to pharm w/Chelle's message.

## 2014-12-04 ENCOUNTER — Other Ambulatory Visit: Payer: Self-pay

## 2014-12-04 MED ORDER — LEVOTHYROXINE SODIUM 25 MCG PO TABS: 25.0000 ug | ORAL_TABLET | Freq: Every day | ORAL | Status: DC

## 2014-12-06 ENCOUNTER — Telehealth: Payer: Self-pay

## 2014-12-06 ENCOUNTER — Ambulatory Visit: Payer: 59 | Admitting: Neurology

## 2014-12-06 NOTE — Telephone Encounter (Signed)
Pt did not show for their appt with Dr. Dohmeier today.  

## 2014-12-07 ENCOUNTER — Encounter: Payer: Self-pay | Admitting: Neurology

## 2015-01-08 ENCOUNTER — Other Ambulatory Visit: Payer: Self-pay

## 2015-01-08 MED ORDER — LEVOTHYROXINE SODIUM 25 MCG PO TABS: 25.0000 ug | ORAL_TABLET | Freq: Every day | ORAL | Status: DC

## 2015-01-22 ENCOUNTER — Other Ambulatory Visit: Payer: Self-pay | Admitting: Orthopedic Surgery

## 2015-01-22 DIAGNOSIS — M545 Low back pain: Secondary | ICD-10-CM

## 2015-01-27 ENCOUNTER — Ambulatory Visit
Admission: RE | Admit: 2015-01-27 | Discharge: 2015-01-27 | Disposition: A | Discharge status: Home / Self Care | Payer: 59 | Source: Ambulatory Visit | Attending: Orthopedic Surgery | Admitting: Orthopedic Surgery

## 2015-01-27 DIAGNOSIS — M545 Low back pain: Secondary | ICD-10-CM

## 2015-05-15 ENCOUNTER — Telehealth: Payer: Self-pay

## 2015-05-15 MED ORDER — CARVEDILOL 25 MG PO TABS: 25.0000 mg | ORAL_TABLET | Freq: Two times a day (BID) | ORAL | Status: AC

## 2015-05-15 NOTE — Telephone Encounter (Signed)
[facility] is calling to check on status of refill request of carvedilol 5mg  [phone]

## 2015-06-04 ENCOUNTER — Other Ambulatory Visit: Payer: Self-pay | Admitting: Family Medicine

## 2016-01-15 DIAGNOSIS — I10 Essential (primary) hypertension: Secondary | ICD-10-CM | POA: Diagnosis not present

## 2016-01-15 DIAGNOSIS — M7741 Metatarsalgia, right foot: Secondary | ICD-10-CM | POA: Diagnosis not present

## 2016-01-15 DIAGNOSIS — N289 Disorder of kidney and ureter, unspecified: Secondary | ICD-10-CM | POA: Diagnosis not present

## 2016-01-17 DIAGNOSIS — N183 Chronic kidney disease, stage 3 (moderate): Secondary | ICD-10-CM | POA: Diagnosis not present

## 2016-01-17 DIAGNOSIS — N2581 Secondary hyperparathyroidism of renal origin: Secondary | ICD-10-CM | POA: Diagnosis not present

## 2016-02-13 DIAGNOSIS — D631 Anemia in chronic kidney disease: Secondary | ICD-10-CM | POA: Diagnosis not present

## 2016-02-13 DIAGNOSIS — N183 Chronic kidney disease, stage 3 (moderate): Secondary | ICD-10-CM | POA: Diagnosis not present

## 2016-02-13 DIAGNOSIS — I129 Hypertensive chronic kidney disease with stage 1 through stage 4 chronic kidney disease, or unspecified chronic kidney disease: Secondary | ICD-10-CM | POA: Diagnosis not present

## 2016-03-25 DIAGNOSIS — I10 Essential (primary) hypertension: Secondary | ICD-10-CM | POA: Diagnosis not present

## 2016-03-25 DIAGNOSIS — Z13228 Encounter for screening for other metabolic disorders: Secondary | ICD-10-CM | POA: Diagnosis not present

## 2016-03-25 DIAGNOSIS — Z131 Encounter for screening for diabetes mellitus: Secondary | ICD-10-CM | POA: Diagnosis not present

## 2016-03-26 DIAGNOSIS — Z13228 Encounter for screening for other metabolic disorders: Secondary | ICD-10-CM | POA: Diagnosis not present

## 2016-03-26 DIAGNOSIS — Z131 Encounter for screening for diabetes mellitus: Secondary | ICD-10-CM | POA: Diagnosis not present

## 2016-04-15 DIAGNOSIS — J019 Acute sinusitis, unspecified: Secondary | ICD-10-CM | POA: Diagnosis not present

## 2016-04-15 DIAGNOSIS — J029 Acute pharyngitis, unspecified: Secondary | ICD-10-CM | POA: Diagnosis not present

## 2016-07-14 DIAGNOSIS — Z124 Encounter for screening for malignant neoplasm of cervix: Secondary | ICD-10-CM | POA: Diagnosis not present

## 2016-07-14 DIAGNOSIS — Z01419 Encounter for gynecological examination (general) (routine) without abnormal findings: Secondary | ICD-10-CM | POA: Diagnosis not present

## 2016-07-14 DIAGNOSIS — Z1231 Encounter for screening mammogram for malignant neoplasm of breast: Secondary | ICD-10-CM | POA: Diagnosis not present

## 2016-07-14 LAB — HM PAP SMEAR

## 2016-09-25 DIAGNOSIS — Z719 Counseling, unspecified: Secondary | ICD-10-CM | POA: Diagnosis not present

## 2017-01-16 DIAGNOSIS — J069 Acute upper respiratory infection, unspecified: Secondary | ICD-10-CM | POA: Diagnosis not present

## 2017-01-16 DIAGNOSIS — I10 Essential (primary) hypertension: Secondary | ICD-10-CM | POA: Diagnosis not present

## 2017-02-02 DIAGNOSIS — N2581 Secondary hyperparathyroidism of renal origin: Secondary | ICD-10-CM | POA: Diagnosis not present

## 2017-02-02 DIAGNOSIS — N183 Chronic kidney disease, stage 3 (moderate): Secondary | ICD-10-CM | POA: Diagnosis not present

## 2017-02-02 DIAGNOSIS — M109 Gout, unspecified: Secondary | ICD-10-CM | POA: Diagnosis not present

## 2017-02-06 DIAGNOSIS — I129 Hypertensive chronic kidney disease with stage 1 through stage 4 chronic kidney disease, or unspecified chronic kidney disease: Secondary | ICD-10-CM | POA: Diagnosis not present

## 2017-02-06 DIAGNOSIS — N183 Chronic kidney disease, stage 3 (moderate): Secondary | ICD-10-CM | POA: Diagnosis not present

## 2017-02-06 DIAGNOSIS — D631 Anemia in chronic kidney disease: Secondary | ICD-10-CM | POA: Diagnosis not present

## 2017-04-15 ENCOUNTER — Other Ambulatory Visit: Payer: Self-pay

## 2017-04-15 ENCOUNTER — Encounter: Payer: Self-pay | Admitting: Family Medicine

## 2017-04-15 ENCOUNTER — Ambulatory Visit: Payer: 59 | Admitting: Family Medicine

## 2017-04-15 VITALS — BP 139/95 | HR 87 | Temp 98.5°F | Resp 16 | Ht 64.5 in | Wt 261.1 lb

## 2017-04-15 DIAGNOSIS — N183 Chronic kidney disease, stage 3 unspecified: Secondary | ICD-10-CM

## 2017-04-15 DIAGNOSIS — F329 Major depressive disorder, single episode, unspecified: Secondary | ICD-10-CM | POA: Diagnosis not present

## 2017-04-15 DIAGNOSIS — I10 Essential (primary) hypertension: Secondary | ICD-10-CM | POA: Diagnosis not present

## 2017-04-15 DIAGNOSIS — N951 Menopausal and female climacteric states: Secondary | ICD-10-CM

## 2017-04-15 DIAGNOSIS — Z9989 Dependence on other enabling machines and devices: Secondary | ICD-10-CM

## 2017-04-15 DIAGNOSIS — R61 Generalized hyperhidrosis: Secondary | ICD-10-CM

## 2017-04-15 DIAGNOSIS — F419 Anxiety disorder, unspecified: Secondary | ICD-10-CM

## 2017-04-15 DIAGNOSIS — E039 Hypothyroidism, unspecified: Secondary | ICD-10-CM | POA: Diagnosis not present

## 2017-04-15 DIAGNOSIS — G4733 Obstructive sleep apnea (adult) (pediatric): Secondary | ICD-10-CM | POA: Diagnosis not present

## 2017-04-15 DIAGNOSIS — R7303 Prediabetes: Secondary | ICD-10-CM

## 2017-04-15 LAB — POCT URINALYSIS DIP (MANUAL ENTRY)
BILIRUBIN UA: NEGATIVE
Glucose, UA: NEGATIVE mg/dL
Ketones, POC UA: NEGATIVE mg/dL
Leukocytes, UA: NEGATIVE
Nitrite, UA: NEGATIVE
PH UA: 5.5 (ref 5.0–8.0)
Protein Ur, POC: NEGATIVE mg/dL
RBC UA: NEGATIVE
Spec Grav, UA: 1.025 (ref 1.010–1.025)
Urobilinogen, UA: 0.2 E.U./dL

## 2017-04-15 MED ORDER — VENLAFAXINE HCL 37.5 MG PO TABS: 37.5000 mg | ORAL_TABLET | Freq: Two times a day (BID) | ORAL | 0 refills | Status: DC

## 2017-04-15 NOTE — Progress Notes (Signed)
Chief Complaint  Patient presents with  . New Patient (Initial Visit)    establish care.  Severe nights sweats per pt she is drenched in sweat and it wakes her from her sleep, left foot has bad nerve pain  that causes cramping with walking or sitting,per pt has the nerve pain in right as well but not as bad    HPI   Night Sweats Reports that she is having more severe night for the last couple of months without weight loss, night time coughing or palpable lymph nodes. She had an ablation sp her last period was 4 years ago. The ablation was done for heavy menses.  She states that she mostly sweats and nights and early mornings but sweats in the day time In the mornings when she is ready for work she gets drenched in the mornings She has to use a fan at work to help her cool off.  GAD 7 : Generalized Anxiety Score 04/15/2017  Control/stop worrying 2  Worry too much - different things 2  Trouble relaxing 3  Restless 3  Easily annoyed or irritable 3  Afraid - awful might happen 0  Anxiety Difficulty Very difficult    Depression screen [facility] 2/9 04/15/2017 04/12/2014 08/12/2013 10/15/2011 04/09/2011  Decreased Interest 1 0 0 0 0  Down, Depressed, Hopeless 2 0 0 0 0  PHQ - 2 Score 3 0 0 0 0  Altered sleeping 3 - - - -  Tired, decreased energy 3 - - - -  Change in appetite 2 - - - -  Feeling bad or failure about yourself  2 - - - -  Trouble concentrating 1 - - - -  Moving slowly or fidgety/restless 3 - - - -  Suicidal thoughts 0 - - - -  PHQ-9 Score 17 - - - -  Difficult doing work/chores Very difficult - - - -   Hypothyroidism She has not had her thyroid checked since 2016. She takes ALL her meds including her thyroid medication at 7 am in the morning She does not miss a dose She has hot flashes, irritability, stress and difficulty sleeping She has increased appetite as well No palpitations, diarrhea, skin changes or vision changes Lab Results  Component Value Date   TSH 1.535  04/12/2014    Hypertension and Gout Patient reports that Dr. Posey Pronto was managing her bp and her gout He was checking labs every six months She states that he would also refill her bp medications She denies chest pains, palpitations, shortness of breath She was not started on a vitamin D supplement recently but reports having low vitamin D levels in the past She reports that her last gout flare was about 2 weeks ago  She took colchicine Her triggers were related to fried meat  Morbid Obesity She plans to start a boot camp program for her exercise program in a week She eats breakfast and dinner at home but eats out for lunch She snacks at night She snacks on ice cream, cookies, popcorn Wt Readings from Last 3 Encounters:  04/15/17 261 lb 1.6 oz (118.4 kg)  04/12/14 268 lb 3.2 oz (121.7 kg)  01/09/14 299 lb (135.6 kg)   Body mass index is 44.13 kg/m.     Past Medical History:  Diagnosis Date  . Abnormal Pap smear   . Allergy   . Anxiety    OCCAS PANIC ATTACKS  . Blood transfusion 1990'S  . Blood transfusion without reported diagnosis   .  BV (bacterial vaginosis)   . Cough    nonproductive cough last 2 weeks  . Depression 2010  . Fibroid 2011  . GERD (gastroesophageal reflux disease)    occasional  . H/O dysmenorrhea   . H/O varicella   . H/O: menorrhagia 2011  . Hypertension   . Hypokalemia    PAST HX  . Increased BMI   . Kidney stones   . Obesity   . OSA (obstructive sleep apnea) 04/22/2013  . Ovarian cyst 2011  . Perimenopausal symptoms 07/15/2010  . Pregnancy induced hypertension   . Shortness of breath    ONLY WITH ANXIETY  . Sleep apnea    PT USES CPAP SOMETIMES - SETTING IS 3  . Weight loss 2010    Current Outpatient Medications  Medication Sig Dispense Refill  . carvedilol (COREG) 25 MG tablet Take 1 tablet (25 mg total) by mouth 2 (two) times daily with a meal. 30 tablet 0  . colchicine 0.6 MG tablet Take 0.6 mg by mouth daily.    Marland Kitchen lisinopril  (PRINIVIL,ZESTRIL) 40 MG tablet Take 40 mg by mouth daily.    . Multiple Vitamins-Minerals (MULTIVITAMIN WITH MINERALS) tablet Take 1 tablet by mouth daily.    Marland Kitchen spironolactone (ALDACTONE) 100 MG tablet Take 100 mg by mouth daily.    Marland Kitchen allopurinol (ZYLOPRIM) 100 MG tablet Take 1 tablet by mouth daily.    Marland Kitchen amLODipine (NORVASC) 5 MG tablet Take 5 mg by mouth at bedtime.    . chlorthalidone (HYGROTON) 25 MG tablet Take 25 mg by mouth daily.    Marland Kitchen levothyroxine (SYNTHROID, LEVOTHROID) 25 MCG tablet Take 25 mcg by mouth daily.    . medroxyPROGESTERone (PROVERA) 10 MG tablet Take 1 tablet by mouth daily.    Marland Kitchen venlafaxine (EFFEXOR) 37.5 MG tablet Take 1 tablet (37.5 mg total) by mouth 2 (two) times daily. 60 tablet 0   No current facility-administered medications for this visit.     Allergies:  Allergies  Allergen Reactions  . Sulfa Antibiotics Hives and Rash    Past Surgical History:  Procedure Laterality Date  . C-SECTIONS X 2    . [place]  . CHOLECYSTECTOMY  1994  . NEPHROLITHOTOMY  04/09/2011   Procedure: NEPHROLITHOTOMY PERCUTANEOUS;  Surgeon: Molli Hazard, MD;  Location: [facility];  Service: Urology;  Laterality: Right;      . NEPHROLITHOTOMY  10/15/2011   Procedure: NEPHROLITHOTOMY PERCUTANEOUS;  Surgeon: Molli Hazard, MD;  Location: [facility];  Service: Urology;  Laterality: Left;  . PERCUTANEOUS NEPHROSTOMY AROUND 1996  may 2013   right kidney  . REMOVAL OF STONES  10/15/2011   Procedure: REMOVAL OF STONES;  Surgeon: Molli Hazard, MD;  Location: [facility];  Service: Urology;  Laterality: Left;  . TUBAL LIGATION  1999  . URETEROSCOPY  04/09/2011   Procedure: URETEROSCOPY;  Surgeon: Molli Hazard, MD;  Location: [facility];  Service: Urology;  Laterality: Right;  . UTERINE ABLATION MARCH 2010  march 2011    Social History   Socioeconomic History  . Marital status: Married    Spouse name: Robyn [name]  . Number of children: 2  . Years of  education: 15+  . Highest education level: Not on file  Occupational History  . Occupation: PROGRAM Theme park manager: [employer]  Social Needs  . Financial resource strain: Not on file  . Food insecurity:    Worry: Not on file  Inability: Not on file  . Transportation needs:    Medical: Not on file    Non-medical: Not on file  Tobacco Use  . Smoking status: Former Smoker    Packs/day: 0.25    Years: 15.00    Pack years: 3.75  . Smokeless tobacco: Never Used  Substance and Sexual Activity  . Alcohol use: No  . Drug use: No  . Sexual activity: Yes    Birth control/protection: Surgical    Comment: BTL 1999  Lifestyle  . Physical activity:    Days per week: Not on file    Minutes per session: Not on file  . Stress: Not on file  Relationships  . Social connections:    Talks on phone: Not on file    Gets together: Not on file    Attends religious service: Not on file    Active member of club or organization: Not on file    Attends meetings of clubs or organizations: Not on file    Relationship status: Not on file  Other Topics Concern  . Not on file  Social History Narrative   Patient is married Robyn [name]) and lives at home with her husband and daughter.   Patient has two children.   Patient is working and attending college full-time.   Patient has a college education.   Patient is right-handed.   Patient drinks two cups of coffee daily and one cup of soda and tea daily.    Family History  Problem Relation Age of Onset  . Diabetes Mother   . Hypertension Mother   . Hypertension Father   . Diabetes Father   . Kidney disease Father   . Diabetes Paternal Grandmother   . Diabetes Maternal Grandmother   . Heart disease Maternal Grandmother      Review of Systems  Constitutional: Negative for chills, fever, malaise/fatigue and weight loss.  HENT: Negative for congestion, ear discharge, ear pain, hearing loss and sinus pain.   Eyes: Negative for blurred vision  and double vision.  Respiratory: Negative for cough, shortness of breath and wheezing.   Cardiovascular: Negative for chest pain, palpitations, leg swelling and PND.  Gastrointestinal: Negative for abdominal pain, constipation, diarrhea, heartburn, nausea and vomiting.  Genitourinary: Negative for dysuria, frequency, hematuria and urgency.  Musculoskeletal: Negative for back pain and neck pain.  Skin: Negative for itching and rash.  Neurological: Negative for dizziness, tingling, tremors and headaches.  Endo/Heme/Allergies: Negative for polydipsia. Does not bruise/bleed easily.  Psychiatric/Behavioral: Positive for depression. The patient is nervous/anxious. The patient does not have insomnia.       Objective: Vitals:   04/15/17 0835  BP: (!) 139/95  Pulse: 87  Resp: 16  Temp: 98.5 F (36.9 C)  TempSrc: Oral  SpO2: 98%  Weight: 261 lb 1.6 oz (118.4 kg)  Height: 5' 4.5" (1.638 m)    Physical Exam  Constitutional: She is oriented to person, place, and time. She appears well-developed and well-nourished.  HENT:  Head: Normocephalic and atraumatic.  Right Ear: External ear normal.  Left Ear: External ear normal.  Nose: Nose normal.  Mouth/Throat: Oropharynx is clear and moist. No oropharyngeal exudate.  Eyes: Conjunctivae and EOM are normal.  Neck: Normal range of motion. No JVD present. No thyromegaly present.  Cardiovascular: Normal rate, regular rhythm and normal heart sounds.  No murmur heard. No carotid bruits  Pulmonary/Chest: Effort normal and breath sounds normal. No stridor. No respiratory distress. She has no wheezes. She has no rales. She  exhibits no tenderness.  Abdominal: Soft. Bowel sounds are normal. She exhibits no distension and no mass. There is no tenderness. There is no guarding.  Musculoskeletal: Normal range of motion. She exhibits no edema.  Lymphadenopathy:    She has no cervical adenopathy.  Neurological: She is alert and oriented to person, place,  and time. No cranial nerve deficit. Coordination normal.  Patellar reflex 3+  Skin: Skin is warm. Capillary refill takes less than 2 seconds.  Psychiatric: She has a normal mood and affect. Her behavior is normal. Judgment and thought content normal.    Assessment and Plan Robyn [name] was seen today for new patient (initial visit).  Diagnoses and all orders for this visit:  OSA on CPAP- pt compliant  Essential hypertension- bp well controlled  Managed by Nephrology  -     CBC -     Comprehensive metabolic panel  CKD (chronic kidney disease), stage III (HCC) -     POCT urinalysis dipstick -     CBC -     VITAMIN D 25 Hydroxy (Vit-D Deficiency, Fractures) -     Comprehensive metabolic panel  Acquired hypothyroidism- will check levels today Last known result was 2016 But may have been checked by Dr. Posey Pronto -     TSH + free T4  Morbid obesity ([place])- weight loss advised which can improve OSA -     Hemoglobin A1c  Night sweats- discuss thyroid and perimenopause -     POCT urinalysis dipstick -     CBC -     FSH/LH  Prediabetes- will assess today Discussed diabetes prevention -     Hemoglobin A1c -     Comprehensive metabolic panel  Perimenopause- continue progestin managed by Gynecology Will start effexor and check [place] and LH -     FSH/LH -     venlafaxine (EFFEXOR) 37.5 MG tablet; Take 1 tablet (37.5 mg total) by mouth 2 (two) times daily.  Anxiety and depression- could be related to thyroid or perimenopause compounding a previous history of anxiety -     venlafaxine (EFFEXOR) 37.5 MG tablet; Take 1 tablet (37.5 mg total) by mouth 2 (two) times daily.    [place]

## 2017-04-15 NOTE — Patient Instructions (Addendum)
   IF you received an x-ray today, you will receive an invoice from [facility]. Please contact  Radiology at [phone] with questions or concerns regarding your invoice.   IF you received labwork today, you will receive an invoice from [facility]. Please contact [facility] at [phone] with questions or concerns regarding your invoice.   Our billing staff will not be able to assist you with questions regarding bills from these companies.  You will be contacted with the lab results as soon as they are available. The fastest way to get your results is to activate your My Chart account. Instructions are located on the last page of this paperwork. If you have not heard from us regarding the results in 2 weeks, please contact this office.    Perimenopause Perimenopause is the time when your body begins to move into the menopause (no menstrual period for 12 straight months). It is a natural process. Perimenopause can begin 2-8 years before the menopause and usually lasts for 1 year after the menopause. During this time, your ovaries may or may not produce an egg. The ovaries vary in their production of estrogen and progesterone hormones each month. This can cause irregular menstrual periods, difficulty getting pregnant, vaginal bleeding between periods, and uncomfortable symptoms. What are the causes?  Irregular production of the ovarian hormones, estrogen and progesterone, and not ovulating every month. Other causes include:  Tumor of the pituitary gland in the brain.  Medical disease that affects the ovaries.  Radiation treatment.  Chemotherapy.  Unknown causes.  Heavy smoking and excessive alcohol intake can bring on perimenopause sooner.  What are the signs or symptoms?  Hot flashes.  Night sweats.  Irregular menstrual periods.  Decreased sex drive.  Vaginal dryness.  Headaches.  Mood swings.  Depression.  Memory  problems.  Irritability.  Tiredness.  Weight gain.  Trouble getting pregnant.  The beginning of losing bone cells (osteoporosis).  The beginning of hardening of the arteries (atherosclerosis). How is this diagnosed? Your health care provider will make a diagnosis by analyzing your age, menstrual history, and symptoms. He or she will do a physical exam and note any changes in your body, especially your female organs. Female hormone tests may or may not be helpful depending on the amount of female hormones you produce and when you produce them. However, other hormone tests may be helpful to rule out other problems. How is this treated? In some cases, no treatment is needed. The decision on whether treatment is necessary during the perimenopause should be made by you and your health care provider based on how the symptoms are affecting you and your lifestyle. Various treatments are available, such as:  Treating individual symptoms with a specific medicine for that symptom.  Herbal medicines that can help specific symptoms.  Counseling.  Group therapy.  Follow these instructions at home:  Keep track of your menstrual periods (when they occur, how heavy they are, how long between periods, and how long they last) as well as your symptoms and when they started.  Only take over-the-counter or prescription medicines as directed by your health care provider.  Sleep and rest.  Exercise.  Eat a diet that contains calcium (good for your bones) and soy (acts like the estrogen hormone).  Do not smoke.  Avoid alcoholic beverages.  Take vitamin supplements as recommended by your health care provider. Taking vitamin E may help in certain cases.  Take calcium and vitamin D supplements to help prevent bone loss.    Group therapy is sometimes helpful.  Acupuncture may help in some cases. Contact a health care provider if:  You have questions about any symptoms you are having.  You need  a referral to a specialist (gynecologist, psychiatrist, or psychologist). Get help right away if:  You have vaginal bleeding.  Your period lasts longer than 8 days.  Your periods are recurring sooner than 21 days.  You have bleeding after intercourse.  You have severe depression.  You have pain when you urinate.  You have severe headaches.  You have vision problems. This information is not intended to replace advice given to you by your health care provider. Make sure you discuss any questions you have with your health care provider. Document Released: 01/31/2004 Document Revised: 05/31/2015 Document Reviewed: 07/22/2012 Elsevier Interactive Patient Education  2017 Elsevier Inc.  

## 2017-04-16 LAB — COMPREHENSIVE METABOLIC PANEL
A/G RATIO: 1.6 (ref 1.2–2.2)
ALT: 23 IU/L (ref 0–32)
AST: 13 IU/L (ref 0–40)
Albumin: 4.5 g/dL (ref 3.5–5.5)
Alkaline Phosphatase: 59 IU/L (ref 39–117)
BUN/Creatinine Ratio: 16 (ref 9–23)
BUN: 25 mg/dL — AB (ref 6–24)
Bilirubin Total: 0.8 mg/dL (ref 0.0–1.2)
CALCIUM: 10.3 mg/dL — AB (ref 8.7–10.2)
CO2: 20 mmol/L (ref 20–29)
CREATININE: 1.6 mg/dL — AB (ref 0.57–1.00)
Chloride: 103 mmol/L (ref 96–106)
GFR, EST AFRICAN AMERICAN: 45 mL/min/{1.73_m2} — AB (ref >59)
GFR, EST NON AFRICAN AMERICAN: 39 mL/min/{1.73_m2} — AB (ref >59)
Globulin, Total: 2.8 g/dL (ref 1.5–4.5)
Glucose: 103 mg/dL — ABNORMAL HIGH (ref 65–99)
POTASSIUM: 4.7 mmol/L (ref 3.5–5.2)
Sodium: 138 mmol/L (ref 134–144)
TOTAL PROTEIN: 7.3 g/dL (ref 6.0–8.5)

## 2017-04-16 LAB — CBC
HEMATOCRIT: 38.2 % (ref 34.0–46.6)
Hemoglobin: 13 g/dL (ref 11.1–15.9)
MCH: 31.7 pg (ref 26.6–33.0)
MCHC: 34 g/dL (ref 31.5–35.7)
MCV: 93 fL (ref 79–97)
Platelets: 246 10*3/uL (ref 150–379)
RBC: 4.1 x10E6/uL (ref 3.77–5.28)
RDW: 15.1 % (ref 12.3–15.4)
WBC: 6.8 10*3/uL (ref 3.4–10.8)

## 2017-04-16 LAB — VITAMIN D 25 HYDROXY (VIT D DEFICIENCY, FRACTURES): Vit D, 25-Hydroxy: 28.5 ng/mL — ABNORMAL LOW (ref 30.0–100.0)

## 2017-04-16 LAB — HEMOGLOBIN A1C
Est. average glucose Bld gHb Est-mCnc: 100 mg/dL
Hgb A1c MFr Bld: 5.1 % (ref 4.8–5.6)

## 2017-04-16 LAB — TSH+FREE T4
Free T4: 1.44 ng/dL (ref 0.82–1.77)
TSH: 2.4 u[IU]/mL (ref 0.450–4.500)

## 2017-04-16 LAB — FSH/LH
FSH: 6.9 m[IU]/mL
LH: 2.9 m[IU]/mL

## 2017-05-04 ENCOUNTER — Encounter: Payer: Self-pay | Admitting: Family Medicine

## 2017-05-08 ENCOUNTER — Other Ambulatory Visit: Payer: Self-pay | Admitting: Family Medicine

## 2017-05-08 NOTE — Telephone Encounter (Signed)
LOV  04/15/17 Dr. Nolon Rod No provider associated with [facility].

## 2017-05-08 NOTE — Telephone Encounter (Signed)
Copied from [place] [id number]. Topic: Quick Communication - Rx Refill/Question >> May 08, 2017  8:34 AM Robina Ade, Helene Kelp D wrote: Medication:levothyroxine (SYNTHROID, LEVOTHROID) 25 MCG tablet Has the patient contacted their pharmacy? Yes (Agent: If no, request that the patient contact the pharmacy for the refill.) Preferred Pharmacy (with phone number or street name): [id number] [place], [place]: Please be advised that RX refills may take up to 3 business days. We ask that you follow-up with your pharmacy.

## 2017-05-09 ENCOUNTER — Other Ambulatory Visit: Payer: Self-pay | Admitting: Family Medicine

## 2017-05-09 DIAGNOSIS — F329 Major depressive disorder, single episode, unspecified: Secondary | ICD-10-CM

## 2017-05-09 DIAGNOSIS — N951 Menopausal and female climacteric states: Secondary | ICD-10-CM

## 2017-05-09 DIAGNOSIS — F419 Anxiety disorder, unspecified: Secondary | ICD-10-CM

## 2017-05-11 NOTE — Telephone Encounter (Signed)
Pt calling to check status on her levothyroxine being refilled. Pt is out of medication.

## 2017-05-12 MED ORDER — LEVOTHYROXINE SODIUM 25 MCG PO TABS: 25.0000 ug | ORAL_TABLET | Freq: Every day | ORAL | 3 refills | Status: DC

## 2017-06-07 ENCOUNTER — Other Ambulatory Visit: Payer: Self-pay | Admitting: Family Medicine

## 2017-06-07 DIAGNOSIS — F329 Major depressive disorder, single episode, unspecified: Secondary | ICD-10-CM

## 2017-06-07 DIAGNOSIS — N951 Menopausal and female climacteric states: Secondary | ICD-10-CM

## 2017-06-07 DIAGNOSIS — F419 Anxiety disorder, unspecified: Secondary | ICD-10-CM

## 2017-06-09 NOTE — Telephone Encounter (Signed)
Patient is requesting a refill of the following medications: Requested Prescriptions   Pending Prescriptions Disp Refills  . venlafaxine (EFFEXOR) 37.5 MG tablet [Pharmacy Med Name: VENLAFAXINE HCL 37.5 MG TABLET] 180 tablet 3    Sig: TAKE 1 TABLET BY MOUTH TWICE A DAY    Date of patient request: 06/07/2017 Last office visit: 04/15/2017  Date of last refill: 06/01/17 Last refill amount: 60 w/6 refills Follow up time period per chart: n/a  Will deny as no refills are needed. [place], CMA

## 2017-07-22 DIAGNOSIS — Z1231 Encounter for screening mammogram for malignant neoplasm of breast: Secondary | ICD-10-CM | POA: Diagnosis not present

## 2017-07-22 DIAGNOSIS — Z01419 Encounter for gynecological examination (general) (routine) without abnormal findings: Secondary | ICD-10-CM | POA: Diagnosis not present

## 2017-07-22 DIAGNOSIS — Z6841 Body Mass Index (BMI) 40.0 and over, adult: Secondary | ICD-10-CM | POA: Diagnosis not present

## 2017-07-22 DIAGNOSIS — E559 Vitamin D deficiency, unspecified: Secondary | ICD-10-CM | POA: Diagnosis not present

## 2017-08-20 DIAGNOSIS — Z09 Encounter for follow-up examination after completed treatment for conditions other than malignant neoplasm: Secondary | ICD-10-CM | POA: Diagnosis not present

## 2017-08-20 DIAGNOSIS — N838 Other noninflammatory disorders of ovary, fallopian tube and broad ligament: Secondary | ICD-10-CM | POA: Diagnosis not present

## 2017-08-20 DIAGNOSIS — D259 Leiomyoma of uterus, unspecified: Secondary | ICD-10-CM | POA: Diagnosis not present

## 2017-09-24 ENCOUNTER — Other Ambulatory Visit: Payer: Self-pay

## 2017-09-24 ENCOUNTER — Ambulatory Visit: Payer: 59 | Admitting: Family Medicine

## 2017-09-24 ENCOUNTER — Encounter: Payer: Self-pay | Admitting: Family Medicine

## 2017-09-24 VITALS — BP 109/75 | HR 76 | Temp 99.0°F | Resp 17 | Ht 64.5 in | Wt 267.4 lb

## 2017-09-24 DIAGNOSIS — R635 Abnormal weight gain: Secondary | ICD-10-CM | POA: Diagnosis not present

## 2017-09-24 DIAGNOSIS — I10 Essential (primary) hypertension: Secondary | ICD-10-CM | POA: Diagnosis not present

## 2017-09-24 DIAGNOSIS — R42 Dizziness and giddiness: Secondary | ICD-10-CM

## 2017-09-24 DIAGNOSIS — R110 Nausea: Secondary | ICD-10-CM | POA: Diagnosis not present

## 2017-09-24 LAB — POCT CBC
GRANULOCYTE PERCENT: 66.1 % (ref 37–80)
HCT, POC: 38 % (ref 37.7–47.9)
HEMOGLOBIN: 12.9 g/dL (ref 12.2–16.2)
Lymph, poc: 2.1 (ref 0.6–3.4)
MCH: 31.3 pg — AB (ref 27–31.2)
MCHC: 34 g/dL (ref 31.8–35.4)
MCV: 92 fL (ref 80–97)
MID (CBC): 0.6 (ref 0–0.9)
MPV: 7.1 fL (ref 0–99.8)
POC Granulocyte: 5.2 (ref 2–6.9)
POC LYMPH PERCENT: 26.2 %L (ref 10–50)
POC MID %: 7.7 %M (ref 0–12)
Platelet Count, POC: 322 10*3/uL (ref 142–424)
RBC: 4.13 M/uL (ref 4.04–5.48)
RDW, POC: 13.4 %
WBC: 7.9 10*3/uL (ref 4.6–10.2)

## 2017-09-24 LAB — COMPREHENSIVE METABOLIC PANEL
A/G RATIO: 1.4 (ref 1.2–2.2)
ALT: 19 IU/L (ref 0–32)
AST: 13 IU/L (ref 0–40)
Albumin: 4.2 g/dL (ref 3.5–5.5)
Alkaline Phosphatase: 78 IU/L (ref 39–117)
BILIRUBIN TOTAL: 0.5 mg/dL (ref 0.0–1.2)
BUN/Creatinine Ratio: 19 (ref 9–23)
BUN: 30 mg/dL — ABNORMAL HIGH (ref 6–24)
CO2: 21 mmol/L (ref 20–29)
Calcium: 10.3 mg/dL — ABNORMAL HIGH (ref 8.7–10.2)
Chloride: 100 mmol/L (ref 96–106)
Creatinine, Ser: 1.61 mg/dL — ABNORMAL HIGH (ref 0.57–1.00)
GFR calc Af Amer: 44 mL/min/{1.73_m2} — ABNORMAL LOW (ref >59)
GFR calc non Af Amer: 38 mL/min/{1.73_m2} — ABNORMAL LOW (ref >59)
Globulin, Total: 2.9 g/dL (ref 1.5–4.5)
Glucose: 132 mg/dL — ABNORMAL HIGH (ref 65–99)
Potassium: 4.3 mmol/L (ref 3.5–5.2)
SODIUM: 138 mmol/L (ref 134–144)
TOTAL PROTEIN: 7.1 g/dL (ref 6.0–8.5)

## 2017-09-24 LAB — POCT GLYCOSYLATED HEMOGLOBIN (HGB A1C): Hemoglobin A1C: 5.3 % (ref 4.0–5.6)

## 2017-09-24 LAB — GLUCOSE, POCT (MANUAL RESULT ENTRY): POC GLUCOSE: 117 mg/dL — AB (ref 70–99)

## 2017-09-24 MED ORDER — ONDANSETRON HCL 4 MG PO TABS: 4.0000 mg | ORAL_TABLET | Freq: Three times a day (TID) | ORAL | 0 refills | Status: DC | PRN

## 2017-09-24 NOTE — Progress Notes (Signed)
Chief Complaint  Patient presents with  . Dizziness    x 2 weeks  . right shoulder pain    x 1 week, taking generic tylenol arthritis- not helping-started taking over the past weekend  . Insomnia    x few weeks.  Per pt can get off to sleep but can't stay asleep  . Achy feet    HPI   Dizziness Essential Hypertension She reports that her bp was high when she was first starting to get dizzy 149/112 She admits to not drinking much water and was taking her bp medication which include chlorthalidone, spironolactone, carvedilol, amlodipine  BP Readings from Last 3 Encounters:  09/24/17 109/75  04/15/17 (!) 139/95  04/12/14 (!) 134/92   She reports that she has not been eating like she normally would Sometimes its due to lack of appetite She also reports that sometimes it was from a sense of nausea  She drinks a cup of coffee in the morning, and a little bit water  Weight gain Morbid Obesity Body mass index is 45.19 kg/m. Wt Readings from Last 3 Encounters:  09/24/17 267 lb 6.4 oz (121.3 kg)  04/15/17 261 lb 1.6 oz (118.4 kg)  04/12/14 268 lb 3.2 oz (121.7 kg)   She does not eat until 2pm She reports that she has a hard time with weight loss and is gaining weight She has a fam hx of diabetes Family History  Problem Relation Age of Onset  . Diabetes Mother   . Hypertension Mother   . Hypertension Father   . Diabetes Father   . Kidney disease Father   . Diabetes Paternal Grandmother   . Diabetes Maternal Grandmother   . Heart disease Maternal Grandmother    She denies polydipsia but has polyuria   Past Medical History:  Diagnosis Date  . Abnormal Pap smear   . Allergy   . Anxiety    OCCAS PANIC ATTACKS  . Blood transfusion 1990'S  . Blood transfusion without reported diagnosis   . BV (bacterial vaginosis)   . Cough    nonproductive cough last 2 weeks  . Depression 2010  . Fibroid 2011  . GERD (gastroesophageal reflux disease)    occasional  . H/O  dysmenorrhea   . H/O varicella   . H/O: menorrhagia 2011  . Hypertension   . Hypokalemia    PAST HX  . Increased BMI   . Kidney stones   . Obesity   . OSA (obstructive sleep apnea) 04/22/2013  . Ovarian cyst 2011  . Perimenopausal symptoms 07/15/2010  . Pregnancy induced hypertension   . Shortness of breath    ONLY WITH ANXIETY  . Sleep apnea    PT USES CPAP SOMETIMES - SETTING IS 3  . Weight loss 2010    Current Outpatient Medications  Medication Sig Dispense Refill  . allopurinol (ZYLOPRIM) 100 MG tablet Take 1 tablet by mouth daily.    Marland Kitchen amLODipine (NORVASC) 5 MG tablet Take 5 mg by mouth at bedtime.    . carvedilol (COREG) 25 MG tablet Take 1 tablet (25 mg total) by mouth 2 (two) times daily with a meal. 30 tablet 0  . chlorthalidone (HYGROTON) 25 MG tablet Take 25 mg by mouth daily.    . colchicine 0.6 MG tablet Take 0.6 mg by mouth daily.    Marland Kitchen levothyroxine (SYNTHROID, LEVOTHROID) 25 MCG tablet Take 1 tablet (25 mcg total) by mouth daily. 90 tablet 3  . lisinopril (PRINIVIL,ZESTRIL) 40 MG tablet Take 40  mg by mouth daily.    . Multiple Vitamins-Minerals (MULTIVITAMIN WITH MINERALS) tablet Take 1 tablet by mouth daily.    Marland Kitchen spironolactone (ALDACTONE) 100 MG tablet Take 100 mg by mouth daily.    Marland Kitchen venlafaxine (EFFEXOR) 37.5 MG tablet TAKE 1 TABLET BY MOUTH TWICE A DAY 60 tablet 6  . medroxyPROGESTERone (PROVERA) 10 MG tablet Take 1 tablet by mouth daily.     No current facility-administered medications for this visit.     Allergies:  Allergies  Allergen Reactions  . Sulfa Antibiotics Hives and Rash    Past Surgical History:  Procedure Laterality Date  . C-SECTIONS X 2    . [place]  . CHOLECYSTECTOMY  1994  . NEPHROLITHOTOMY  04/09/2011   Procedure: NEPHROLITHOTOMY PERCUTANEOUS;  Surgeon: Molli Hazard, MD;  Location: [facility];  Service: Urology;  Laterality: Right;      . NEPHROLITHOTOMY  10/15/2011   Procedure: NEPHROLITHOTOMY  PERCUTANEOUS;  Surgeon: Molli Hazard, MD;  Location: [facility];  Service: Urology;  Laterality: Left;  . PERCUTANEOUS NEPHROSTOMY AROUND 1996  may 2013   right kidney  . REMOVAL OF STONES  10/15/2011   Procedure: REMOVAL OF STONES;  Surgeon: Molli Hazard, MD;  Location: [facility];  Service: Urology;  Laterality: Left;  . TUBAL LIGATION  1999  . URETEROSCOPY  04/09/2011   Procedure: URETEROSCOPY;  Surgeon: Molli Hazard, MD;  Location: [facility];  Service: Urology;  Laterality: Right;  . UTERINE ABLATION MARCH 2010  march 2011    Social History   Socioeconomic History  . Marital status: Married    Spouse name: Iona Beard  . Number of children: 2  . Years of education: 15+  . Highest education level: Not on file  Occupational History  . Occupation: PROGRAM Theme park manager: [employer]  Social Needs  . Financial resource strain: Not on file  . Food insecurity:    Worry: Not on file    Inability: Not on file  . Transportation needs:    Medical: Not on file    Non-medical: Not on file  Tobacco Use  . Smoking status: Former Smoker    Packs/day: 0.25    Years: 15.00    Pack years: 3.75  . Smokeless tobacco: Never Used  Substance and Sexual Activity  . Alcohol use: No  . Drug use: No  . Sexual activity: Yes    Birth control/protection: Surgical    Comment: BTL 1999  Lifestyle  . Physical activity:    Days per week: Not on file    Minutes per session: Not on file  . Stress: Not on file  Relationships  . Social connections:    Talks on phone: Not on file    Gets together: Not on file    Attends religious service: Not on file    Active member of club or organization: Not on file    Attends meetings of clubs or organizations: Not on file    Relationship status: Not on file  Other Topics Concern  . Not on file  Social History Narrative   Patient is married Iona Beard) and lives at home with her husband and daughter.   Patient has two children.   Patient is  working and attending college full-time.   Patient has a college education.   Patient is right-handed.   Patient drinks two cups of coffee daily and one cup of soda and tea daily.  Family History  Problem Relation Age of Onset  . Diabetes Mother   . Hypertension Mother   . Hypertension Father   . Diabetes Father   . Kidney disease Father   . Diabetes Paternal Grandmother   . Diabetes Maternal Grandmother   . Heart disease Maternal Grandmother      ROS Review of Systems See HPI Constitution: No fevers or chills No malaise No diaphoresis Skin: No rash or itching Eyes: no blurry vision, no double vision GU: no dysuria or hematuria Neuro: +dizziness, no headaches all others reviewed and negative   Objective: Vitals:   09/24/17 0820  BP: 109/75  Pulse: 76  Resp: 17  Temp: 99 F (37.2 C)  TempSrc: Oral  SpO2: 96%  Weight: 267 lb 6.4 oz (121.3 kg)  Height: 5' 4.5" (1.638 m)    Physical Exam  Constitutional: She is oriented to person, place, and time. She appears well-developed and well-nourished.  Eyes: Conjunctivae and EOM are normal.  Cardiovascular: Normal rate, regular rhythm and normal heart sounds.  No murmur heard. Pulmonary/Chest: Effort normal and breath sounds normal. No stridor. No respiratory distress. She has no wheezes.  Musculoskeletal: Normal range of motion. She exhibits no edema.  Neurological: She is alert and oriented to person, place, and time. No cranial nerve deficit. Coordination normal.     Assessment and Plan Robyn [name] was seen today for dizziness, right shoulder pain, insomnia and achy feet.  Diagnoses and all orders for this visit:  Dizzy -     EKG 12-Lead -     Comprehensive metabolic panel -     POCT glucose (manual entry) -     POCT CBC -     POCT glycosylated hemoglobin (Hb A1C)     EKG unavailable Pt will increase hydration and cut down on caffeine She will also eat small frequent meals Try peppermint or ginger for  nausea if that does not help then take zofran Follow up 2 weeks   Essential hypertension- increase hydration  Will cut the chlorthalidone in half until the dizziness resolves  -     Comprehensive metabolic panel  Weight gain- will refer to nutrition  Will screen for diabetes -     POCT glycosylated hemoglobin (Hb A1C)     Robyn [name] A Smrithi Pigford

## 2017-09-24 NOTE — Patient Instructions (Addendum)
If you have lab work done today you will be contacted with your lab results within the next 2 weeks.  If you have not heard from [place] then please contact us. The fastest way to get your results is to register for My Chart.   IF you received an x-ray today, you will receive an invoice from [facility]. Please contact [facility] at [phone] with questions or concerns regarding your invoice.   IF you received labwork today, you will receive an invoice from [facility]. Please contact [facility] at [phone] with questions or concerns regarding your invoice.   Our billing staff will not be able to assist you with questions regarding bills from these companies.  You will be contacted with the lab results as soon as they are available. The fastest way to get your results is to activate your My Chart account. Instructions are located on the last page of this paperwork. If you have not heard from [place] regarding the results in 2 weeks, please contact this office.    Dizziness Dizziness is a common problem. It is a feeling of unsteadiness or light-headedness. You may feel like you are about to faint. Dizziness can lead to injury if you stumble or fall. Anyone can become dizzy, but dizziness is more common in older adults. This condition can be caused by a number of things, including medicines, dehydration, or illness. Follow these instructions at home: Eating and drinking  Drink enough fluid to keep your urine clear or pale yellow. This helps to keep you from becoming dehydrated. Try to drink more clear fluids, such as water.  Do not drink alcohol.  Limit your caffeine intake if told to do so by your health care provider. Check ingredients and nutrition facts to see if a food or beverage contains caffeine.  Limit your salt (sodium) intake if told to do so by your health care provider. Check ingredients and nutrition facts to see if a food or beverage contains  sodium. Activity  Avoid making quick movements. ? Rise slowly from chairs and steady yourself until you feel okay. ? In the morning, first sit up on the side of the bed. When you feel okay, stand slowly while you hold onto something until you know that your balance is fine.  If you need to stand in one place for a long time, move your legs often. Tighten and relax the muscles in your legs while you are standing.  Do not drive or use heavy machinery if you feel dizzy.  Avoid bending down if you feel dizzy. Place items in your home so that they are easy for you to reach without leaning over. Lifestyle  Do not use any products that contain nicotine or tobacco, such as cigarettes and e-cigarettes. If you need help quitting, ask your health care provider.  Try to reduce your stress level by using methods such as yoga or meditation. Talk with your health care provider if you need help to manage your stress. General instructions  Watch your dizziness for any changes.  Take over-the-counter and prescription medicines only as told by your health care provider. Talk with your health care provider if you think that your dizziness is caused by a medicine that you are taking.  Tell a friend or a family member that you are feeling dizzy. If he or she notices any changes in your behavior, have this person call your health care provider.  Keep all follow-up visits as told by your health care provider.  This is important. Contact a health care provider if:  Your dizziness does not go away.  Your dizziness or light-headedness gets worse.  You feel nauseous.  You have reduced hearing.  You have new symptoms.  You are unsteady on your feet or you feel like the room is spinning. Get help right away if:  You vomit or have diarrhea and are unable to eat or drink anything.  You have problems talking, walking, swallowing, or using your arms, hands, or legs.  You feel generally weak.  You are not  thinking clearly or you have trouble forming sentences. It may take a friend or family member to notice this.  You have chest pain, abdominal pain, shortness of breath, or sweating.  Your vision changes.  You have any bleeding.  You have a severe headache.  You have neck pain or a stiff neck.  You have a fever. These symptoms may represent a serious problem that is an emergency. Do not wait to see if the symptoms will go away. Get medical help right away. Call your local emergency services (911 in the [place] Do not drive yourself to the hospital. Summary  Dizziness is a feeling of unsteadiness or light-headedness. This condition can be caused by a number of things, including medicines, dehydration, or illness.  Anyone can become dizzy, but dizziness is more common in older adults.  Drink enough fluid to keep your urine clear or pale yellow. Do not drink alcohol.  Avoid making quick movements if you feel dizzy. Monitor your dizziness for any changes. This information is not intended to replace advice given to you by your health care provider. Make sure you discuss any questions you have with your health care provider. Document Released: 06/18/2000 Document Revised: 01/26/2016 Document Reviewed: 01/26/2016 Elsevier Interactive Patient Education  [employer].

## 2017-09-25 ENCOUNTER — Encounter: Payer: Self-pay | Admitting: Registered"

## 2017-09-25 ENCOUNTER — Encounter: Payer: 59 | Attending: Family Medicine | Admitting: Registered"

## 2017-09-25 DIAGNOSIS — R635 Abnormal weight gain: Secondary | ICD-10-CM | POA: Diagnosis not present

## 2017-09-25 DIAGNOSIS — I10 Essential (primary) hypertension: Secondary | ICD-10-CM | POA: Diagnosis not present

## 2017-09-25 DIAGNOSIS — Z713 Dietary counseling and surveillance: Secondary | ICD-10-CM | POA: Insufficient documentation

## 2017-09-25 NOTE — Progress Notes (Signed)
Medical Nutrition Therapy:  Appt start time: 8:15 end time:  9:11.   Assessment:  Primary concerns today: Pt states she needs to make positive changes for her body.   Pt expectations: knowledge of what she can eat  Pt states she is taking 6 blood pressure medications. Pt states she has been paying for diets to help lose weight. Pt states she has been looking at gastric sleeve as an option to help. Pt states she knows she needs to lose weight because she does not want to be on blood pressure medications. Pt states she wants to feel better, eat better, and make better choices.   Pt states she is not sleeping through the night. Pt states she has been overeating or will forget to eat until 3-5 pm then eats everything until bedtime. Pt reports having having dizziness and lightheadedness and currently focusing on staying hydrated. Pt states she works Mon-Fri 8 am-5 pm with 1 hr lunch break. Pt reports commute to and from work is 13 minutes.   Pt states she likes carbonated drinks including carbonated water. Pt states she is planning to begin walking with coworker next week for physical activity.    Preferred Learning Style:   No preference indicated   Learning Readiness:   Ready  Change in progress   MEDICATIONS: See list   DIETARY INTAKE:  Usual eating pattern includes 2 meals and 3 snacks per day.  Everyday foods include salad, meat, rice, fast food, chips, and cookies.  Avoided foods include none stated.    24-hr recall:  B ( AM): coffee + water  Snk ( AM): none  L (2 PM): salad + chicken or [place] wrap + bacon or soup Snk ( PM): chocolate + chips  D ( PM): chicken or pork chops + rice or pork chops + salad + popcorn + ice cream  Snk ( PM): Popeye's-fried chicken + cole slaw Beverages: coffee, soda, juice, water (a small amount)  Usual physical activity: none stated  Estimated energy needs: 1800 calories 200 g carbohydrates 135 g protein 50 g fat  Progress Towards  Goal(s):  In progress.   Nutritional Diagnosis:  NB-1.1 Food and nutrition-related knowledge deficit As related to lack of prior nutrition-related education.  As evidenced by pt reports she has never been educated on hypertension and nutirtion-related information.    Intervention:  Nutrition education and counseling. Pt was educated and counseled on hypertension, the DASH plan, the importance of eating 3 meals a day, hydration, causes of kidney disease, reading nutrition facts labels, ways to monitor sodium intake, and the importance of physical activity. Pt was in agreement with goals listed.  Goals: - Aim to complete at least 2-3 bottles of water a day.  - Fill up bottles and sip throughout workday.  - Aim to eat breakfast with carbohydrate and protein.  - Create walking schedule with co-worker for at least 3 days a week.   Teaching Method Utilized:  Visual Auditory Hands on  Handouts given during visit include:  Hypertension Nutrition Therapy  Barriers to learning/adherence to lifestyle change: work-life balance  Demonstrated degree of understanding via:  Teach Back   Monitoring/Evaluation:  Dietary intake, exercise, and body weight in 1 month(s).

## 2017-09-25 NOTE — Patient Instructions (Addendum)
-   Aim to complete at least 2-3 bottles of water a day.   - Fill up bottles and sip throughout workday.   - Aim to eat breakfast with carbohydrate and protein.   - Create walking schedule with co-worker for at least 3 days a week.

## 2017-10-15 ENCOUNTER — Ambulatory Visit: Payer: 59 | Admitting: Family Medicine

## 2017-10-26 DIAGNOSIS — E559 Vitamin D deficiency, unspecified: Secondary | ICD-10-CM | POA: Diagnosis not present

## 2017-10-28 ENCOUNTER — Ambulatory Visit: Payer: 59 | Admitting: Registered"

## 2017-11-06 ENCOUNTER — Encounter: Payer: 59 | Attending: Family Medicine | Admitting: Registered"

## 2017-11-06 ENCOUNTER — Encounter: Payer: Self-pay | Admitting: Registered"

## 2017-11-06 DIAGNOSIS — Z713 Dietary counseling and surveillance: Secondary | ICD-10-CM | POA: Diagnosis present

## 2017-11-06 DIAGNOSIS — R635 Abnormal weight gain: Secondary | ICD-10-CM | POA: Insufficient documentation

## 2017-11-06 DIAGNOSIS — I10 Essential (primary) hypertension: Secondary | ICD-10-CM | POA: Diagnosis not present

## 2017-11-06 NOTE — Patient Instructions (Addendum)
-   Add in fruit with breakfast.   - Keep up the great work with drinking water. Try to increase to 4 bottles.   - Continue to work out at least 3 days/week. Great job!  - Add in vegetables with dinner option.

## 2017-11-06 NOTE — Progress Notes (Signed)
Medical Nutrition Therapy:  Appt start time: 8:50 end time: 9:10.   Assessment:  Primary concerns today: Pt states she needs to make positive changes for her body.   Pt expectations: knowledge of what she can eat  Pt states she is concerned about snacking at night mostly with candy and cookies. Pt has added protein option for breakfast and taking sips of water while driving. Pt states she has been been drinking more water (2.5-3 bottles/day from 0 bottles/day) and trying to make better choices. Pt states has been walking with friend 3 days/week. Pt states she has worked out with group at work [employer] (walking, biking, treadmill, strength-training, weight-bearing exercises 30 min, 3 days/week) and plans to continue. Pt states her blood pressure has been doing ok. Pt was initially interested in weight but later changed to not wanting to know about weight changes in order to focus more on behavior change. Pt is very motivated.   Next visit: review, sleeping  Pt states she is taking 6 blood pressure medications. Pt states she has been paying for diets to help lose weight. Pt states she has been looking at gastric sleeve as an option to help. Pt states she knows she needs to lose weight because she does not want to be on blood pressure medications. Pt states she wants to feel better, eat better, and make better choices.   Pt states she is not sleeping through the night. Pt states she works Mon-Fri 8 am-5 pm with 1 hr lunch break, works from home on [employer]. Pt reports commute to and from work is 13 minutes.   Pt states she likes carbonated drinks including carbonated water.   Preferred Learning Style:   No preference indicated   Learning Readiness:   Ready  Change in progress   MEDICATIONS: See list   DIETARY INTAKE:  Usual eating pattern includes 2 meals and 3 snacks per day.  Everyday foods include salad, meat, rice, fast food, chips, and cookies.  Avoided foods include none stated.     24-hr recall:  B ( AM): coffee + nuts or string cheese + coffee Snk ( AM): none  L (2 PM): chef salad + turkey/ham/salami + viniagrette dressing  Snk ( PM): almonds D ( PM): pizza or rice pilaf + beef brisket  Snk ( PM): candy, cookies Beverages: coffee, ginger ale with dinner, juice, water (2.5-3 bottles 16 oz)  Usual physical activity: none stated  Estimated energy needs: 1800 calories 200 g carbohydrates 135 g protein 50 g fat  Progress Towards Goal(s):  In progress.   Nutritional Diagnosis:  NB-1.1 Food and nutrition-related knowledge deficit As related to lack of prior nutrition-related education.  As evidenced by pt reports she has never been educated on hypertension and nutirtion-related information.    Intervention:  Nutrition education and counseling. Pt was congratulated and encouraged to continue with great changes made increasing water intake, adding in breakfast, and increasing physical activity. Pt was educated and counseled on the importance of giving body enough nourishment throughout the day to help with night time snacking. Pt was in agreement with goals listed.  Goals: - Add in fruit with breakfast.  - Keep up the great work with drinking water. Try to increase to 4 bottles.  - Continue to work out at least 3 days/week. Great job! - Add in vegetables with dinner option.   Teaching Method Utilized:  Visual Auditory Hands on  Handouts given during visit include:  none  Barriers to learning/adherence to lifestyle  change: work-life balance  Demonstrated degree of understanding via:  Teach Back   Monitoring/Evaluation:  Dietary intake, exercise, and body weight in 1 month(s).

## 2017-11-12 DIAGNOSIS — Z23 Encounter for immunization: Secondary | ICD-10-CM | POA: Diagnosis not present

## 2017-11-13 ENCOUNTER — Ambulatory Visit: Payer: 59 | Admitting: Family Medicine

## 2017-12-02 DIAGNOSIS — N2581 Secondary hyperparathyroidism of renal origin: Secondary | ICD-10-CM | POA: Diagnosis not present

## 2017-12-02 DIAGNOSIS — I129 Hypertensive chronic kidney disease with stage 1 through stage 4 chronic kidney disease, or unspecified chronic kidney disease: Secondary | ICD-10-CM | POA: Diagnosis not present

## 2017-12-02 DIAGNOSIS — N189 Chronic kidney disease, unspecified: Secondary | ICD-10-CM | POA: Diagnosis not present

## 2017-12-02 DIAGNOSIS — D631 Anemia in chronic kidney disease: Secondary | ICD-10-CM | POA: Diagnosis not present

## 2017-12-02 DIAGNOSIS — N183 Chronic kidney disease, stage 3 (moderate): Secondary | ICD-10-CM | POA: Diagnosis not present

## 2017-12-02 DIAGNOSIS — E669 Obesity, unspecified: Secondary | ICD-10-CM | POA: Diagnosis not present

## 2017-12-07 ENCOUNTER — Ambulatory Visit: Payer: 59 | Admitting: Registered"

## 2017-12-25 ENCOUNTER — Ambulatory Visit: Payer: Self-pay | Admitting: *Deleted

## 2017-12-25 DIAGNOSIS — J029 Acute pharyngitis, unspecified: Secondary | ICD-10-CM | POA: Diagnosis not present

## 2017-12-25 DIAGNOSIS — J019 Acute sinusitis, unspecified: Secondary | ICD-10-CM | POA: Diagnosis not present

## 2017-12-25 DIAGNOSIS — R05 Cough: Secondary | ICD-10-CM | POA: Diagnosis not present

## 2017-12-25 NOTE — Telephone Encounter (Signed)
Pt. Reports she started having cough and congestion Monday. Non-productive cough. Has yellow drainage from nose. Ears "feel stopped up." No fever.Has tried OTC - Mucinex, Sudafed. No availability today at office. Pt. Going out of town tomorrow. Pt. Will try My Chart E-Visit.  Reason for Disposition . [1] Continuous (nonstop) coughing interferes with work or school AND [2] no improvement using cough treatment per protocol  Answer Assessment - Initial Assessment Questions 1. ONSET: "When did the cough begin?"      Started Monday 2. SEVERITY: "How bad is the cough today?"      Severe 3. RESPIRATORY DISTRESS: "Describe your breathing."      No 4. FEVER: "Do you have a fever?" If so, ask: "What is your temperature, how was it measured, and when did it start?"     No 5. HEMOPTYSIS: "Are you coughing up any blood?" If so ask: "How much?" (flecks, streaks, tablespoons, etc.)     No 6. TREATMENT: "What have you done so far to treat the cough?" (e.g., meds, fluids, humidifier)     Mucinex, Sudafed 7. CARDIAC HISTORY: "Do you have any history of heart disease?" (e.g., heart attack, congestive heart failure)      HTN 8. LUNG HISTORY: "Do you have any history of lung disease?"  (e.g., pulmonary embolus, asthma, emphysema)     No 9. PE RISK FACTORS: "Do you have a history of blood clots?" (or: recent major surgery, recent prolonged travel, bedridden)     No 10. OTHER SYMPTOMS: "Do you have any other symptoms? (e.g., runny nose, wheezing, chest pain)       Sinus congestion, ears are stopped up 11. PREGNANCY: "Is there any chance you are pregnant?" "When was your last menstrual period?"       No 12. TRAVEL: "Have you traveled out of the country in the last month?" (e.g., travel history, exposures)       No  Protocols used: COUGH - ACUTE NON-PRODUCTIVE-A-AH

## 2017-12-25 NOTE — Telephone Encounter (Signed)
Summary: congestion/cough   Pt called noting congestion and coughing since Monday and going out of town tomorrow. Pt has tried mucinex, sudafed, and Zicam. These do not seem to offer relief. Pt asking for advice.      Left message to call back.

## 2018-01-03 ENCOUNTER — Other Ambulatory Visit: Payer: Self-pay | Admitting: Family Medicine

## 2018-01-03 DIAGNOSIS — F329 Major depressive disorder, single episode, unspecified: Secondary | ICD-10-CM

## 2018-01-03 DIAGNOSIS — F419 Anxiety disorder, unspecified: Secondary | ICD-10-CM

## 2018-01-03 DIAGNOSIS — F32A Depression, unspecified: Secondary | ICD-10-CM

## 2018-01-03 DIAGNOSIS — N951 Menopausal and female climacteric states: Secondary | ICD-10-CM

## 2018-01-04 NOTE — Telephone Encounter (Signed)
Please advise 

## 2018-02-06 ENCOUNTER — Encounter: Payer: Self-pay | Admitting: Podiatry

## 2018-02-06 ENCOUNTER — Ambulatory Visit: Payer: 59 | Admitting: Podiatry

## 2018-02-06 ENCOUNTER — Ambulatory Visit (INDEPENDENT_AMBULATORY_CARE_PROVIDER_SITE_OTHER): Payer: 59

## 2018-02-06 DIAGNOSIS — M779 Enthesopathy, unspecified: Secondary | ICD-10-CM

## 2018-02-06 DIAGNOSIS — M7752 Other enthesopathy of left foot: Secondary | ICD-10-CM

## 2018-02-06 DIAGNOSIS — M76822 Posterior tibial tendinitis, left leg: Secondary | ICD-10-CM | POA: Diagnosis not present

## 2018-02-06 DIAGNOSIS — D259 Leiomyoma of uterus, unspecified: Secondary | ICD-10-CM | POA: Insufficient documentation

## 2018-02-06 MED ORDER — TRIAMCINOLONE ACETONIDE 10 MG/ML IJ SUSP
10.0000 mg | Freq: Once | INTRAMUSCULAR | Status: AC
  Administered 2018-02-06: 10 mg

## 2018-02-06 MED ORDER — DICLOFENAC SODIUM 75 MG PO TBEC: 75.0000 mg | DELAYED_RELEASE_TABLET | Freq: Two times a day (BID) | ORAL | 2 refills | Status: DC

## 2018-02-15 NOTE — Progress Notes (Signed)
Subjective:   Patient ID: Robyn [name], female   DOB: 48 y.o.   MRN: [id number]   HPI Patient presents stating of a lot of pain in the inside of my left ankle and its been going on now for several months and gradually getting worse over that time.  Patient states that she does have flat feet and is always had flat feet and has moderate obesity but does not smoke and likes to be active   Review of Systems  All other systems reviewed and are negative.       Objective:  Physical Exam Vitals signs and nursing note reviewed.  Constitutional:      Appearance: She is well-developed.  Pulmonary:     Effort: Pulmonary effort is normal.  Musculoskeletal: Normal range of motion.  Skin:    General: Skin is warm.  Neurological:     Mental Status: She is alert.     Neurovascular status intact muscle strength is adequate range of motion was within normal limits with patient found to have depression of the arch left with inflammation fluid and pain of the posterior tibial tendon as it comes under the medial malleolus as it inserts into the navicular bone left.  Patient is noted to have good digital perfusion well oriented x3     Assessment:  Acute posterior tibial tendinitis left with inflammation fluid of the tendon as it comes underneath the medial malleolus     Plan:  H&P condition reviewed and today did careful sheath injection left 3 mg Kenalog 5 mg Xylocaine advised on supportive shoe therapy and dispense fascial brace to lift up the arch.  Discussed possibility for orthotics and reappoint to reevaluate 3 weeks or earlier if needed  X-rays indicate moderate depression of the arch bilateral

## 2018-02-22 ENCOUNTER — Encounter (HOSPITAL_COMMUNITY): Payer: Self-pay

## 2018-02-22 ENCOUNTER — Ambulatory Visit: Payer: Self-pay

## 2018-02-22 ENCOUNTER — Ambulatory Visit (HOSPITAL_COMMUNITY)
Admission: EM | Admit: 2018-02-22 | Discharge: 2018-02-22 | Disposition: A | Discharge status: Home / Self Care | Payer: 59 | Attending: Urgent Care | Admitting: Urgent Care

## 2018-02-22 DIAGNOSIS — R42 Dizziness and giddiness: Secondary | ICD-10-CM | POA: Diagnosis not present

## 2018-02-22 DIAGNOSIS — I10 Essential (primary) hypertension: Secondary | ICD-10-CM

## 2018-02-22 DIAGNOSIS — G44209 Tension-type headache, unspecified, not intractable: Secondary | ICD-10-CM

## 2018-02-22 DIAGNOSIS — R739 Hyperglycemia, unspecified: Secondary | ICD-10-CM

## 2018-02-22 DIAGNOSIS — E860 Dehydration: Secondary | ICD-10-CM

## 2018-02-22 DIAGNOSIS — R030 Elevated blood-pressure reading, without diagnosis of hypertension: Secondary | ICD-10-CM | POA: Diagnosis not present

## 2018-02-22 LAB — POCT URINALYSIS DIP (DEVICE)
Bilirubin Urine: NEGATIVE
Glucose, UA: NEGATIVE mg/dL
Hgb urine dipstick: NEGATIVE
Leukocytes,Ua: NEGATIVE
Nitrite: NEGATIVE
PH: 5 (ref 5.0–8.0)
PROTEIN: 100 mg/dL — AB
Specific Gravity, Urine: 1.02 (ref 1.005–1.030)
Urobilinogen, UA: 0.2 mg/dL (ref 0.0–1.0)

## 2018-02-22 LAB — GLUCOSE, CAPILLARY: Glucose-Capillary: 150 mg/dL — ABNORMAL HIGH (ref 70–99)

## 2018-02-22 MED ORDER — KETOROLAC TROMETHAMINE 60 MG/2ML IM SOLN
INTRAMUSCULAR | Status: AC
  Filled 2018-02-22: qty 2

## 2018-02-22 MED ORDER — KETOROLAC TROMETHAMINE 60 MG/2ML IM SOLN
60.0000 mg | Freq: Once | INTRAMUSCULAR | Status: AC
  Administered 2018-02-22: 60 mg via INTRAMUSCULAR

## 2018-02-22 NOTE — ED Provider Notes (Addendum)
MRN: [id number] DOB: 22-Apr-1970  Subjective:   Robyn [name] is a 48 y.o. female presenting for 1 month history of persistent daily dizziness with associated intermittent frontal headaches with occasional photosensitivity. Has tried APAP, Advil PM, aspirin with minimal relief. Her PCP is Dr. Nolon Rod at [facility]. She was unable to see them until March 2020 and advised she come here today. Drinks a lot of sodas, does not hydrate with water at all. Has not tested positive for diabetes yet.   No current facility-administered medications for this encounter.   Current Outpatient Medications:  .  allopurinol (ZYLOPRIM) 100 MG tablet, Take 1 tablet by mouth daily., Disp: , Rfl:  .  amLODipine (NORVASC) 5 MG tablet, Take 5 mg by mouth at bedtime., Disp: , Rfl:  .  carvedilol (COREG) 25 MG tablet, Take 1 tablet (25 mg total) by mouth 2 (two) times daily with a meal., Disp: 30 tablet, Rfl: 0 .  chlorthalidone (HYGROTON) 25 MG tablet, Take 25 mg by mouth daily., Disp: , Rfl:  .  colchicine 0.6 MG tablet, Take 0.6 mg by mouth daily., Disp: , Rfl:  .  diclofenac (VOLTAREN) 75 MG EC tablet, Take 1 tablet (75 mg total) by mouth 2 (two) times daily., Disp: 50 tablet, Rfl: 2 .  levothyroxine (SYNTHROID, LEVOTHROID) 25 MCG tablet, Take 1 tablet (25 mcg total) by mouth daily., Disp: 90 tablet, Rfl: 3 .  lisinopril (PRINIVIL,ZESTRIL) 40 MG tablet, Take 40 mg by mouth daily., Disp: , Rfl:  .  Multiple Vitamins-Minerals (MULTIVITAMIN WITH MINERALS) tablet, Take 1 tablet by mouth daily., Disp: , Rfl:  .  spironolactone (ALDACTONE) 100 MG tablet, Take 100 mg by mouth daily., Disp: , Rfl:  .  venlafaxine (EFFEXOR) 37.5 MG tablet, TAKE 1 TABLET BY MOUTH TWICE A DAY, Disp: 60 tablet, Rfl: 6   Allergies  Allergen Reactions  . Sulfa Antibiotics Hives and Rash    Past Medical History:  Diagnosis Date  . Abnormal Pap smear   . Allergy   . Anxiety    OCCAS PANIC ATTACKS  . Blood transfusion 1990'S  . Blood  transfusion without reported diagnosis   . BV (bacterial vaginosis)   . Cough    nonproductive cough last 2 weeks  . Depression 2010  . Fibroid 2011  . GERD (gastroesophageal reflux disease)    occasional  . H/O dysmenorrhea   . H/O varicella   . H/O: menorrhagia 2011  . Hypertension   . Hypokalemia    PAST HX  . Increased BMI   . Kidney stones   . Obesity   . OSA (obstructive sleep apnea) 04/22/2013  . Ovarian cyst 2011  . Perimenopausal symptoms 07/15/2010  . Pregnancy induced hypertension   . Shortness of breath    ONLY WITH ANXIETY  . Sleep apnea    PT USES CPAP SOMETIMES - SETTING IS 3  . Weight loss 2010     Past Surgical History:  Procedure Laterality Date  . C-SECTIONS X 2    . [place]  . CHOLECYSTECTOMY  1994  . NEPHROLITHOTOMY  04/09/2011   Procedure: NEPHROLITHOTOMY PERCUTANEOUS;  Surgeon: Molli Hazard, MD;  Location: [facility];  Service: Urology;  Laterality: Right;      . NEPHROLITHOTOMY  10/15/2011   Procedure: NEPHROLITHOTOMY PERCUTANEOUS;  Surgeon: Molli Hazard, MD;  Location: [facility];  Service: Urology;  Laterality: Left;  . PERCUTANEOUS NEPHROSTOMY AROUND 1996  may 2013   right kidney  . REMOVAL  OF STONES  10/15/2011   Procedure: REMOVAL OF STONES;  Surgeon: Molli Hazard, MD;  Location: [facility];  Service: Urology;  Laterality: Left;  . TUBAL LIGATION  1999  . URETEROSCOPY  04/09/2011   Procedure: URETEROSCOPY;  Surgeon: Molli Hazard, MD;  Location: [facility];  Service: Urology;  Laterality: Right;  . UTERINE ABLATION MARCH 2010  march 2011    ROS  Objective:   Vitals: BP 136/88 (BP Location: Right Arm)   Pulse 62   Temp 98.1 F (36.7 C) (Oral)   Resp 17   SpO2 100%   Physical Exam Constitutional:      General: She is not in acute distress.    Appearance: Normal appearance. She is well-developed. She is not ill-appearing.  HENT:     Head: Normocephalic and atraumatic.     Right Ear: Tympanic  membrane and external ear normal.     Left Ear: Tympanic membrane and external ear normal.     Nose: Nose normal.     Mouth/Throat:     Mouth: Mucous membranes are dry.     Pharynx: Oropharynx is clear. No oropharyngeal exudate or posterior oropharyngeal erythema.  Eyes:     General: No scleral icterus.       Right eye: No discharge.        Left eye: No discharge.     Extraocular Movements: Extraocular movements intact.     Pupils: Pupils are equal, round, and reactive to light.  Cardiovascular:     Rate and Rhythm: Normal rate.  Pulmonary:     Effort: Pulmonary effort is normal.  Skin:    General: Skin is warm and dry.  Neurological:     General: No focal deficit present.     Mental Status: She is alert and oriented to person, place, and time.     Cranial Nerves: No cranial nerve deficit.     Sensory: No sensory deficit.     Motor: No weakness.     Coordination: Coordination normal.     Gait: Gait normal.     Deep Tendon Reflexes: Reflexes normal.  Psychiatric:        Mood and Affect: Mood normal.        Behavior: Behavior normal.        Thought Content: Thought content normal.        Judgment: Judgment normal.     Results for orders placed or performed during the hospital encounter of 02/22/18 (from the past 24 hour(s))  Glucose, capillary     Status: Abnormal   Collection Time: 02/22/18  4:18 PM  Result Value Ref Range   Glucose-Capillary 150 (H) 70 - 99 mg/dL  POCT urinalysis dip (device)     Status: Abnormal   Collection Time: 02/22/18  4:20 PM  Result Value Ref Range   Glucose, UA NEGATIVE NEGATIVE mg/dL   Bilirubin Urine NEGATIVE NEGATIVE   Ketones, ur TRACE (A) NEGATIVE mg/dL   Specific Gravity, Urine 1.020 1.005 - 1.030   Hgb urine dipstick NEGATIVE NEGATIVE   pH 5.0 5.0 - 8.0   Protein, ur 100 (A) NEGATIVE mg/dL   Urobilinogen, UA 0.2 0.0 - 1.0 mg/dL   Nitrite NEGATIVE NEGATIVE   Leukocytes,Ua NEGATIVE NEGATIVE    Assessment and Plan :    Dizziness  Acute non intractable tension-type headache  Essential hypertension  Elevated blood pressure reading  Dehydration  Elevated blood sugar  Counseled patient extensively on need for dietary modifications, daily adequate hydration with  water.  Recommend that she stop drinking sodas.  For now we will use conservative management, IM Toradol in clinic. Counseled patient on potential for adverse effects with medications prescribed today, patient verbalized understanding. ER and return-to-clinic precautions discussed, patient verbalized understanding. Otherwise, follow up with PCP.     Jaynee Eagles, PA-C 02/22/18 [place], [place] 02/22/18 1651

## 2018-02-22 NOTE — Telephone Encounter (Signed)
Pt c/o moderate headache to the top of her head x 1 month. Pt c/o light hurting her eyes.  Pt has tried taking Tylenol or Advil with little improvement. Pt c/o  Moderate lightheadedness, fatigue and intermittent nausea. Denies any weakness or numbness to either arm, face, leg. No appt at [facility] Pt advised to go to [facility] for evaluation. Pt verbalized understanding.   Reason for Disposition . [1] MODERATE headache (e.g., interferes with normal activities) AND [2] present > 24 hours AND [3] unexplained  (Exceptions: analgesics not tried, typical migraine, or headache part of viral illness) . [1] MODERATE dizziness (e.g., interferes with normal activities) AND [2] has been evaluated by physician for this  Answer Assessment - Initial Assessment Questions 1. LOCATION: "Where does it hurt?"      Top of forehead photophobic 2. ONSET: "When did the headache start?" (Minutes, hours or days)      1 month ago 3. PATTERN: "Does the pain come and go, or has it been constant since it started?"     Comes and goes  4. SEVERITY: "How bad is the pain?" and "What does it keep you from doing?"  (e.g., Scale 1-10; mild, moderate, or severe)   - MILD (1-3): doesn't interfere with normal activities    - MODERATE (4-7): interferes with normal activities or awakens from sleep    - SEVERE (8-10): excruciating pain, unable to do any normal activities        Moderate  5. RECURRENT SYMPTOM: "Have you ever had headaches before?" If so, ask: "When was the last time?" and "What happened that time?"      Yes- months ago- tylenol or Advil 6. CAUSE: "What do you think is causing the headache?"     Pt does not know 7. MIGRAINE: "Have you been diagnosed with migraine headaches?" If so, ask: "Is this headache similar?"      no 8. HEAD INJURY: "Has there been any recent injury to the head?"      no 9. OTHER SYMPTOMS: "Do you have any other symptoms?" (fever, stiff neck, eye pain, sore throat, cold symptoms)     Eye are sensitive  to light, fatigue, lightheadedness 10. PREGNANCY: "Is there any chance you are pregnant?" "When was your last menstrual period?"      Ablation years ago  Answer Assessment - Initial Assessment Questions 1. DESCRIPTION: "Describe your dizziness."     Feels like she is going to faint 2. LIGHTHEADED: "Do you feel lightheaded?" (e.g., somewhat faint, woozy, weak upon standing)     yes 3. VERTIGO: "Do you feel like either you or the room is spinning or tilting?" (i.e. vertigo)     no 4. SEVERITY: "How bad is it?"  "Do you feel like you are going to faint?" "Can you stand and walk?"   - MILD - walking normally   - MODERATE - interferes with normal activities (e.g., work, school)    - SEVERE - unable to stand, requires support to walk, feels like passing out now.      moderate 5. ONSET:  "When did the dizziness begin?"     2-3 days 6. AGGRAVATING FACTORS: "Does anything make it worse?" (e.g., standing, change in head position)     Standing, laughing 7. HEART RATE: "Can you tell me your heart rate?" "How many beats in 15 seconds?"  (Note: not all patients can do this)       Pt doesn't know how to do this 8. CAUSE: "What do you  think is causing the dizziness?"     Pt does not know 9. RECURRENT SYMPTOM: "Have you had dizziness before?" If so, ask: "When was the last time?" "What happened that time?"     Yes dx vertigo was put on meclizine and symptoms went away 10. OTHER SYMPTOMS: "Do you have any other symptoms?" (e.g., fever, chest pain, vomiting, diarrhea, bleeding)      Intermittent nausea, headache x 1 month 11. PREGNANCY: "Is there any chance you are pregnant?" "When was your last menstrual period?"       No pt had uterine ablation  Protocols used: HEADACHE-A-AH, DIZZINESS - [facility]

## 2018-02-22 NOTE — Discharge Instructions (Addendum)
Avoid salt in your foods, restaurant foods.   Salads - kale, spinach, cabbage, spring mix; use seeds like pumpkin seeds or sunflower seeds; you can also use 1-2 hard boiled eggs. Fruits - avocadoes, berries (blueberries, raspberries, blackberries), apples, oranges, pomegranate, grapefruit Seeds - quinoa, chia seeds; you can also incorporate oatmeal Vegetables - aspargus, cauliflower, broccoli, green beans, brussel spouts, bell peppers; stay away from starchy vegetables like potatoes, carrots, peas

## 2018-03-01 ENCOUNTER — Ambulatory Visit: Payer: 59 | Admitting: Podiatry

## 2018-04-20 ENCOUNTER — Encounter: Payer: Self-pay | Admitting: Neurology

## 2018-04-20 ENCOUNTER — Telehealth: Payer: Self-pay | Admitting: Neurology

## 2018-04-20 NOTE — Telephone Encounter (Signed)
Pt called back and I advised her that our office is now providing the capability to offer the patients virtual visits at this time. Informed of what that process looks like and informed that the Virtual visit will still be billed through insurance as such. Due to [place],informed the patient since the appointment is taking place over the phone/internet app, we can't guarantee the security of the phone line. With that said if we do move forward I would have to get verbal consent to completed the call over the phone. Patient gave verbal consent to move forward with the video visit. I have reviewed the patient's chart and made sure that everything is up to date. Patient is also made aware that since this is a video visit we are able to complete the visit but a physical exam is not able to be done since the patient is not present in person. Pt's email is [email or web address]. Pt understands that the [employer] webex software must be downloaded and operational on the device pt plans to use for the visit. Pt verbalized understanding of this information and will states to be ready for the visit at least 15 min prior to the visit.

## 2018-04-20 NOTE — Telephone Encounter (Signed)
Called the patient to inform them that our office has placed new protocols in place for our office visits. Due to Covid 19 our office is reducing our number of office visits in order to minimize the risk to our patients and healthcare providers.Our office is now providing the capability to offer the patients virtual visits at this time.  There was no answer. LVM for the patient to call back to discuss further.

## 2018-04-27 ENCOUNTER — Ambulatory Visit (INDEPENDENT_AMBULATORY_CARE_PROVIDER_SITE_OTHER): Payer: 59 | Admitting: Neurology

## 2018-04-27 ENCOUNTER — Encounter: Payer: Self-pay | Admitting: Neurology

## 2018-04-27 ENCOUNTER — Other Ambulatory Visit: Payer: Self-pay

## 2018-04-27 DIAGNOSIS — G4710 Hypersomnia, unspecified: Secondary | ICD-10-CM

## 2018-04-27 DIAGNOSIS — Z9989 Dependence on other enabling machines and devices: Secondary | ICD-10-CM

## 2018-04-27 DIAGNOSIS — N951 Menopausal and female climacteric states: Secondary | ICD-10-CM

## 2018-04-27 DIAGNOSIS — Z720 Tobacco use: Secondary | ICD-10-CM | POA: Diagnosis not present

## 2018-04-27 DIAGNOSIS — Z9114 Patient's other noncompliance with medication regimen: Secondary | ICD-10-CM

## 2018-04-27 DIAGNOSIS — Z91199 Patient's noncompliance with other medical treatment and regimen due to unspecified reason: Secondary | ICD-10-CM

## 2018-04-27 DIAGNOSIS — N183 Chronic kidney disease, stage 3 unspecified: Secondary | ICD-10-CM

## 2018-04-27 DIAGNOSIS — G4709 Other insomnia: Secondary | ICD-10-CM

## 2018-04-27 DIAGNOSIS — R0981 Nasal congestion: Secondary | ICD-10-CM

## 2018-04-27 DIAGNOSIS — G4733 Obstructive sleep apnea (adult) (pediatric): Secondary | ICD-10-CM

## 2018-04-27 MED ORDER — MOMETASONE FUROATE 50 MCG/ACT NA SUSP: 2.0000 | Freq: Every day | NASAL | 12 refills | Status: DC

## 2018-04-27 NOTE — Patient Instructions (Signed)
Screening for Sleep Apnea  Sleep apnea is a condition in which breathing pauses or becomes shallow during sleep. Sleep apnea screening is a test to determine if you are at risk for sleep apnea. The test is easy and only takes a few minutes. Your health care provider may ask you to have this test in preparation for surgery or as part of a physical exam. What are the symptoms of sleep apnea? Common symptoms of sleep apnea include:  Snoring.  Restless sleep.  Daytime sleepiness.  Pauses in breathing.  Choking during sleep.  Irritability.  Forgetfulness.  Trouble thinking clearly.  Depression.  Personality changes. Most people with sleep apnea are not aware that they have it. Why should I get screened? Getting screened for sleep apnea can help:  Ensure your safety. It is important for your health care providers to know whether or not you have sleep apnea, especially if you are having surgery or have other long-term (chronic) health conditions.  Improve your health and allow you to get a better night's rest. Restful sleep can help you: ? Have more energy. ? Lose weight. ? Improve high blood pressure. ? Improve diabetes management. ? Prevent stroke. ? Prevent car accidents. How is screening done? Screening usually includes being asked a list of questions about your sleep quality. Some questions you may be asked include:  Do you snore?  Is your sleep restless?  Do you have daytime sleepiness?  Has a partner or spouse told you that you stop breathing during sleep?  Have you had trouble concentrating or memory loss? If your screening test is positive, you are at risk for the condition. Further testing may be needed to confirm a diagnosis of sleep apnea. Where to find more information You can find screening tools online or at your health care clinic. For more information about sleep apnea screening and healthy sleep, visit these websites:  Centers for Disease  Control and Prevention: [employer]pl  American Sleep Apnea Association: www.sleepapnea.org Contact a health care provider if:  You think that you may have sleep apnea. Summary  Sleep apnea screening can help determine if you are at risk for sleep apnea.  It is important for your health care providers to know whether or not you have sleep apnea, especially if you are having surgery or have other chronic health conditions.  You may be asked to take a screening test for sleep apnea in preparation for surgery or as part of a physical exam. This information is not intended to replace advice given to you by your health care provider. Make sure you discuss any questions you have with your health care provider. Document Released: 04/04/2016 Document Revised: 04/04/2016 Document Reviewed: 04/04/2016 Elsevier Interactive Patient Education  2019 [place]. Insomnia Insomnia is a sleep disorder that makes it difficult to fall asleep or stay asleep. Insomnia can cause fatigue, low energy, difficulty concentrating, mood swings, and poor performance at work or school. There are three different ways to classify insomnia:  Difficulty falling asleep.  Difficulty staying asleep.  Waking up too early in the morning. Any type of insomnia can be long-term (chronic) or short-term (acute). Both are common. Short-term insomnia usually lasts for three months or less. Chronic insomnia occurs at least three times a week for longer than three months. What are the causes? Insomnia may be caused by another condition, situation, or substance, such as:  Anxiety.  Certain medicines.  Gastroesophageal reflux disease (GERD) or other gastrointestinal conditions.  Asthma  or other breathing conditions.  Restless legs syndrome, sleep apnea, or other sleep disorders.  Chronic pain.  Menopause.  Stroke.  Abuse of alcohol, tobacco, or illegal drugs.  Mental health conditions, such as  depression.  Caffeine.  Neurological disorders, such as Alzheimer's disease.  An overactive thyroid (hyperthyroidism). Sometimes, the cause of insomnia may not be known. What increases the risk? Risk factors for insomnia include:  Gender. Women are affected more often than men.  Age. Insomnia is more common as you get older.  Stress.  Lack of exercise.  Irregular work schedule or working night shifts.  Traveling between different time zones.  Certain medical and mental health conditions. What are the signs or symptoms? If you have insomnia, the main symptom is having trouble falling asleep or having trouble staying asleep. This may lead to other symptoms, such as:  Feeling fatigued or having low energy.  Feeling nervous about going to sleep.  Not feeling rested in the morning.  Having trouble concentrating.  Feeling irritable, anxious, or depressed. How is this diagnosed? This condition may be diagnosed based on:  Your symptoms and medical history. Your health care provider may ask about: ? Your sleep habits. ? Any medical conditions you have. ? Your mental health.  A physical exam. How is this treated? Treatment for insomnia depends on the cause. Treatment may focus on treating an underlying condition that is causing insomnia. Treatment may also include:  Medicines to help you sleep.  Counseling or therapy.  Lifestyle adjustments to help you sleep better. Follow these instructions at home: Eating and drinking   Limit or avoid alcohol, caffeinated beverages, and cigarettes, especially close to bedtime. These can disrupt your sleep.  Do not eat a large meal or eat spicy foods right before bedtime. This can lead to digestive discomfort that can make it hard for you to sleep. Sleep habits   Keep a sleep diary to help you and your health care provider figure out what could be causing your insomnia. Write down: ? When you sleep. ? When you wake up during  the night. ? How well you sleep. ? How rested you feel the next day. ? Any side effects of medicines you are taking. ? What you eat and drink.  Make your bedroom a dark, comfortable place where it is easy to fall asleep. ? Put up shades or blackout curtains to block light from outside. ? Use a white noise machine to block noise. ? Keep the temperature cool.  Limit screen use before bedtime. This includes: ? Watching TV. ? Using your smartphone, tablet, or computer.  Stick to a routine that includes going to bed and waking up at the same times every day and night. This can help you fall asleep faster. Consider making a quiet activity, such as reading, part of your nighttime routine.  Try to avoid taking naps during the day so that you sleep better at night.  Get out of bed if you are still awake after 15 minutes of trying to sleep. Keep the lights down, but try reading or doing a quiet activity. When you feel sleepy, go back to bed. General instructions  Take over-the-counter and prescription medicines only as told by your health care provider.  Exercise regularly, as told by your health care provider. Avoid exercise starting several hours before bedtime.  Use relaxation techniques to manage stress. Ask your health care provider to suggest some techniques that may work well for you. These may include: ? Breathing  exercises. ? Routines to release muscle tension. ? Visualizing peaceful scenes.  Make sure that you drive carefully. Avoid driving if you feel very sleepy.  Keep all follow-up visits as told by your health care provider. This is important. Contact a health care provider if:  You are tired throughout the day.  You have trouble in your daily routine due to sleepiness.  You continue to have sleep problems, or your sleep problems get worse. Get help right away if:  You have serious thoughts about hurting yourself or someone else. If you ever feel like you may hurt  yourself or others, or have thoughts about taking your own life, get help right away. You can go to your nearest emergency department or call:  Your local emergency services (911 in the [place]  A suicide crisis helpline, such as the [place] at 360 481 6994. This is open 24 hours a day. Summary  Insomnia is a sleep disorder that makes it difficult to fall asleep or stay asleep.  Insomnia can be long-term (chronic) or short-term (acute).  Treatment for insomnia depends on the cause. Treatment may focus on treating an underlying condition that is causing insomnia.  Keep a sleep diary to help you and your health care provider figure out what could be causing your insomnia. This information is not intended to replace advice given to you by your health care provider. Make sure you discuss any questions you have with your health care provider. Document Released: 12/21/1999 Document Revised: 10/02/2016 Document Reviewed: 10/02/2016 Elsevier Interactive Patient Education  2019 [employer].

## 2018-04-27 NOTE — Progress Notes (Signed)
[facility]  Virtual Visit via Video Note  I connected with Robyn [name] on 04/27/18 at 10:00 AM EDT by a video enabled telemedicine application and verified that I am speaking with the correct person using two identifiers.   I discussed the limitations of evaluation and management by telemedicine and the availability of in person appointments. The patient expressed understanding and agreed to proceed.  History of Present Illness:Robyn [name] had her sleep study at  [facility] 7 or 8 years ago as she recalls.  Her last visit with her in the office is well over 4 years ago.       Provider:  Larey Seat, M D  Referring Provider: Forrest Moron, MD Primary Care Physician: Caroleen Hamman, PA   "would like to use my CPAP with more benefit "   HPI:  Robyn [name] is a 48 y.o., right handed, AA female . She was last seen here as a referral  from Dr. Joseph Art after a sleep study on 09-10-12.!  In the interval there have been lots of progressions in her medical history :her obesity has progressed and her BMI is well over 45, she is now perimenopausal, she had chronic headaches which has improved over the last 4 weeks or so, has  insomnia ( not necessarily improved), she has hot flashes, chronic kidney disease stage III for which she is followed by Dr. Graylon Gunning,  she has nasal allergies with congestion.  She has difficulties now with insomnia; also she states that originally her CPAP helped and she slept better.  Over the years there have been times when she had low compliance and at one time she was convinced that the CPAP caused headaches.   Since January 2020-of this year- she has made a conscious effort again to use CPAP and she has noted that her sleep has improved very much over the last 4 weeks- which also seems to be coinciding with the coronavirus quarantine.  She has less  stress working from home.   She sleeps better and her  CPAP seems to work but she feels that her nose is blocked and she cannot get the optimal pressures . Often, in the middle of the night, she will remove the nasal pillows.  She feels that she cannot breathe well.   I was able to review her current compliance she has used the machine 90% of days and 65% compliant 4 hours and her residual AHI is very low at only 0.2/h.  The machine is older it is set at 10 cmH2O with 3 cm EPR and she is using nasal pillows but these do not always stay in place.  She had tried a fullface mask but it was very uncomfortable and created air leakage.  Her last sleep study is now 62 or 48 years old.   Sleep habits: dinnertime for the patient is between 7 and 8 PM usually she goes to bed by about 930 but she is often asleep as late as 1 AM and she does use her cell phone or tablet while in bed.  She has tried melatonin, Unisom and Benadryl but she has not made significant changes to her sleep hygiene otherwise.  If she falls asleep at midnight she will be up again within 2 hours due to nocturia at least twice each night.  She wakes up sweating and feeling clammy which has been identified as a perimenopausal symptom.  Progesterone supplements have not helped and she  was started by her primary care physician on venlafaxine, she still wakes up with hot flashes.  Rises now at 7 AM, working from home. TST 6 hours.  Reports less headaches since working from home.      Social history : The patient works for [employer] and is working from home, her daughter returned from [place] where she studied abroad during the call with crisis.  Her husband works half a day at the city of [employer] and for the other half of the day from home, she does not use tobacco, no alcohol and she drinks only 2 to 3 cups of coffee a day while at home.  She states that normally during the office day she would have 1 cup only.  Family history-is negative for other members of the family with obstructive  sleep apnea. Her parents had diabetes mellitus her mother also had hypertension and her father end-stage renal disease on hemodialysis for 8 years now.  Mrs. Bishara was originally referred in Summer 2014 for the evaluation of possible sleep apnea.  The patient had been diagnosed with obstructive sleep apnea years prior and records were longer available for [place]. For a brief time she had tried to use CPAP in the past but the machine broke and she couldn't replace it for some time. She had endorsed the Epworth Sleepiness Scale at 15 of 24 points and the PHQ  9 at 15 points, indicating depression. Her BMI was 47.8. She had felt fatigued for years.   The patient was diagnosed with an AHI of 33.4 and the same RDI. REM sleep was not noted and in supine sleep her AHI was mildly worse at 40.0. She did not have the tract but desaturations of oxygen. Unfortunately, the Co2 monitor was not working that night.  The patient was titrated to 10 cm water CPAP with a nasal pillow. Compliance instructions were included under the recommendations. Positional therapy and weight loss were recommended. [facility] followed her over the CPAP use ,  the download was obtained and forwarded to [place] dated 03-07-13. The patient had 100% thirty- out of 30 day compliance, and  70% of days  for over 4 hours nightly use, The machine is set at 10 cm water pressure with an EPR of 2 cm water , her residual AHI is 0.4.  Based on compliance  the low AHI, the therapy has been very effective for the patient. Average daily use was 5 hours and 24 minutes, the median air leak was only 0.90 L / minute.   Review of Systems: Out of a complete 14 system review, the patient complains of only the following symptoms, and all other reviewed systems are negative. er fatigue score was still quite high at 50/ 63 points.  How likely are you to doze in the following situations: 0 = not likely, 1 = slight chance, 2 = moderate chance, 3 = high  chance  Sitting and Reading? Watching Television? Sitting inactive in a public place (theater or meeting)? Lying down in the afternoon when circumstances permit? Sitting and talking to someone? Sitting quietly after lunch without alcohol? In a car, while stopped for a few minutes in traffic? As a passenger in a car for an hour without a break?  Total = 8    Social History   Socioeconomic History  . Marital status: Married    Spouse name: Iona Beard  . Number of children: 2  . Years of education: 15+  . Highest  education level: Not on file  Occupational History  . Occupation: PROGRAM Theme park manager: [employer]  Social Needs  . Financial resource strain: Not on file  . Food insecurity:    Worry: Not on file    Inability: Not on file  . Transportation needs:    Medical: Not on file    Non-medical: Not on file  Tobacco Use  . Smoking status: Former Smoker    Packs/day: 0.25    Years: 15.00    Pack years: 3.75  . Smokeless tobacco: Never Used  Substance and Sexual Activity  . Alcohol use: No  . Drug use: No  . Sexual activity: Yes    Birth control/protection: Surgical    Comment: BTL 1999  Lifestyle  . Physical activity:    Days per week: Not on file    Minutes per session: Not on file  . Stress: Not on file  Relationships  . Social connections:    Talks on phone: Not on file    Gets together: Not on file    Attends religious service: Not on file    Active member of club or organization: Not on file    Attends meetings of clubs or organizations: Not on file    Relationship status: Not on file  . Intimate partner violence:    Fear of current or ex partner: Not on file    Emotionally abused: Not on file    Physically abused: Not on file    Forced sexual activity: Not on file  Other Topics Concern  . Not on file  Social History Narrative   Patient is married Iona Beard) and lives at home with her husband and daughter.   Patient has two children.   Patient  is working and attending college full-time.   Patient has a college education.   Patient is right-handed.   Patient drinks two cups of coffee daily and one cup of soda and tea daily.    Family History  Problem Relation Age of Onset  . Diabetes Mother   . Hypertension Mother   . Hypertension Father   . Diabetes Father   . Kidney disease Father   . Diabetes Paternal Grandmother   . Diabetes Maternal Grandmother   . Heart disease Maternal Grandmother     Past Medical History:  Diagnosis Date  . Abnormal Pap smear   . Allergy   . Anxiety    OCCAS PANIC ATTACKS  . Blood transfusion 1990'S  . Blood transfusion without reported diagnosis   . BV (bacterial vaginosis)   . Cough    nonproductive cough last 2 weeks  . Depression 2010  . Fibroid 2011  . GERD (gastroesophageal reflux disease)    occasional  . H/O dysmenorrhea   . H/O varicella   . H/O: menorrhagia 2011  . Hypertension   . Hypokalemia    PAST HX  . Increased BMI   . Kidney stones   . Obesity   . OSA (obstructive sleep apnea) 04/22/2013  . Ovarian cyst 2011  . Perimenopausal symptoms 07/15/2010  . Pregnancy induced hypertension   . Shortness of breath    ONLY WITH ANXIETY  . Sleep apnea    PT USES CPAP SOMETIMES - SETTING IS 3  . Weight loss 2010    Past Surgical History:  Procedure Laterality Date  . C-SECTIONS X 2    . [place]  . CHOLECYSTECTOMY  1994  . NEPHROLITHOTOMY  04/09/2011   Procedure: NEPHROLITHOTOMY PERCUTANEOUS;  Surgeon: Molli Hazard, MD;  Location: [facility];  Service: Urology;  Laterality: Right;      . NEPHROLITHOTOMY  10/15/2011   Procedure: NEPHROLITHOTOMY PERCUTANEOUS;  Surgeon: Molli Hazard, MD;  Location: [facility];  Service: Urology;  Laterality: Left;  . PERCUTANEOUS NEPHROSTOMY AROUND 1996  may 2013   right kidney  . REMOVAL OF STONES  10/15/2011   Procedure: REMOVAL OF STONES;  Surgeon: Molli Hazard, MD;  Location: [facility];  Service:  Urology;  Laterality: Left;  . TUBAL LIGATION  1999  . URETEROSCOPY  04/09/2011   Procedure: URETEROSCOPY;  Surgeon: Molli Hazard, MD;  Location: [facility];  Service: Urology;  Laterality: Right;  . UTERINE ABLATION MARCH 2010  march 2011    Current Outpatient Medications  Medication Sig Dispense Refill  . allopurinol (ZYLOPRIM) 100 MG tablet Take 1 tablet by mouth daily.    Marland Kitchen amLODipine (NORVASC) 5 MG tablet Take 5 mg by mouth at bedtime.    . carvedilol (COREG) 25 MG tablet Take 1 tablet (25 mg total) by mouth 2 (two) times daily with a meal. 30 tablet 0  . chlorthalidone (HYGROTON) 25 MG tablet Take 25 mg by mouth daily.    . colchicine 0.6 MG tablet Take 0.6 mg by mouth daily.    . diclofenac (VOLTAREN) 75 MG EC tablet Take 1 tablet (75 mg total) by mouth 2 (two) times daily. 50 tablet 2  . levothyroxine (SYNTHROID, LEVOTHROID) 25 MCG tablet Take 1 tablet (25 mcg total) by mouth daily. 90 tablet 3  . lisinopril (PRINIVIL,ZESTRIL) 40 MG tablet Take 40 mg by mouth daily.    . Multiple Vitamins-Minerals (MULTIVITAMIN WITH MINERALS) tablet Take 1 tablet by mouth daily.    Marland Kitchen spironolactone (ALDACTONE) 100 MG tablet Take 100 mg by mouth daily.    Marland Kitchen venlafaxine (EFFEXOR) 37.5 MG tablet TAKE 1 TABLET BY MOUTH TWICE A DAY 60 tablet 6   No current facility-administered medications for this visit.     Allergies as of 04/27/2018 - Review Complete 04/20/2018  Allergen Reaction Noted  . Sulfa antibiotics Hives and Rash 04/01/2011    Vitals: There were no vitals taken for this visit. Last Weight:  Wt Readings from Last 1 Encounters:  11/06/17 275 lb 14.4 oz (125.1 kg)   Last Height:   Ht Readings from Last 1 Encounters:  11/06/17 5\' 5"  (1.651 m)     Observations/Objective:   General: The patient is awake, alert. The patient is well groomed. Head: Normocephalic, atraumatic. Neck is supple. Mallampati 4 , neck circumference: 19 inches , large tongue. Nasal congestion. Trunk: BMI  is severely elevated,normal posture.  Neurologic exam : The patient is awake and alert, oriented to place and time.  There is a normal attention span & concentration ability. Speech is fluent without dysarthria, dysphonia or aphasia. Mood and affect are appropriate.  Cranial nerves: no loss of taste or smell reported. Pupils are equal  Facial motor strength is symmetric and tongue and uvula move midline.  Motor exam:  Normal bulk and symmetric ROM in all extremities, (except Hands- she has a lesser grip and pinch strength in the left hand) , and stated she feels her hand tingling " asleep" feeling in the morning.  Alternating movements in the fingers/hands without evidence of ataxia, dysmetria or tremor.Gait and station: Patient walks without assistive device .      Assessment and Plan: Plan #1 I would like for the  patient to use nasal spray for nasal patency her CPAP now is over 59 years old [facility] and is not as tender machine set at 10 cm water pressure.  It seems to still reduce her apnea significantly and the nasal spray is meant to make the use of CPAP more comfortable.  2.  We have addressed sleep hygiene: no screen light, smart phone, orTV in the bedroom.  Turn the clocks around.   Read a book in bed not on a device.  Maybe she should extend her bedtime from 9 PM until 10 or 10:30 PM to not have hours of frustrating insomnia and sleep latency ahead of her.  We discussed that her obstructive sleep apnea may need to be reinvestigated, her last sleep study was 7 or 8 years ago and she needs to know what her current baseline apnea index may be.  Her risk factor is definitely her body mass index 45, her overbite and retrognathia, which creates a disproportionately large tongue.    Mallampati is grade 5, neck circumference is 16 inches.  I ordered a home sleep test for the reassessment and also offered a weight management referral.   Follow Up Instructions: Rv in  2-3 month face to face.     I discussed the assessment and treatment plan with the patient. The patient was provided an opportunity to ask questions and all were answered. The patient agreed with the plan and demonstrated an understanding of the instructions.   The patient was advised to call back or seek an in-person evaluation if the symptoms worsen or if the condition fails to improve as anticipated.  I provided 30 minutes of non-face-to-face time during this encounter.   Larey Seat, MD

## 2018-05-03 ENCOUNTER — Encounter: Payer: Self-pay | Admitting: Family Medicine

## 2018-05-06 ENCOUNTER — Other Ambulatory Visit: Payer: Self-pay

## 2018-05-06 ENCOUNTER — Encounter: Payer: Self-pay | Admitting: Family Medicine

## 2018-05-06 ENCOUNTER — Telehealth (INDEPENDENT_AMBULATORY_CARE_PROVIDER_SITE_OTHER): Payer: 59 | Admitting: Family Medicine

## 2018-05-06 VITALS — BP 140/82 | Ht 65.0 in | Wt 284.0 lb

## 2018-05-06 DIAGNOSIS — G4733 Obstructive sleep apnea (adult) (pediatric): Secondary | ICD-10-CM

## 2018-05-06 DIAGNOSIS — I10 Essential (primary) hypertension: Secondary | ICD-10-CM | POA: Diagnosis not present

## 2018-05-06 DIAGNOSIS — N951 Menopausal and female climacteric states: Secondary | ICD-10-CM

## 2018-05-06 DIAGNOSIS — R519 Headache, unspecified: Secondary | ICD-10-CM

## 2018-05-06 DIAGNOSIS — Z299 Encounter for prophylactic measures, unspecified: Secondary | ICD-10-CM | POA: Diagnosis not present

## 2018-05-06 DIAGNOSIS — R51 Headache: Secondary | ICD-10-CM

## 2018-05-06 DIAGNOSIS — Z114 Encounter for screening for human immunodeficiency virus [HIV]: Secondary | ICD-10-CM | POA: Diagnosis not present

## 2018-05-06 DIAGNOSIS — N183 Chronic kidney disease, stage 3 unspecified: Secondary | ICD-10-CM

## 2018-05-06 DIAGNOSIS — Z9989 Dependence on other enabling machines and devices: Secondary | ICD-10-CM

## 2018-05-06 DIAGNOSIS — E034 Atrophy of thyroid (acquired): Secondary | ICD-10-CM

## 2018-05-06 MED ORDER — ACETAMINOPHEN 500 MG PO TABS: 500.0000 mg | ORAL_TABLET | Freq: Four times a day (QID) | ORAL | 0 refills | Status: DC | PRN

## 2018-05-06 NOTE — Progress Notes (Signed)
CC: Headaches and dizziness x 6 months, intermittent.  Per pt taking otc aspirin for ha's without relief.  Severe night sweats where she is completely drenched. Per pt crying for no reason twice a day or more and muscle pain.  No travel outside the country or [place] in the last 3 weeks, but per pt her daughter was in [place] from January to the 2nd week in March.  No corona symptoms but told to quarantine for 14 days.  Per pt she has not had any covid-19 symptoms. Depression score: 18

## 2018-05-06 NOTE — Patient Instructions (Signed)
° ° ° °  If you have lab work done today you will be contacted with your lab results within the next 2 weeks.  If you have not heard from us then please contact us. The fastest way to get your results is to register for My Chart. ° ° °IF you received an x-ray today, you will receive an invoice from [facility]. Please contact [facility] at [phone] with questions or concerns regarding your invoice.  ° °IF you received labwork today, you will receive an invoice from [facility]. Please contact [facility] at [phone] with questions or concerns regarding your invoice.  ° °Our billing staff will not be able to assist you with questions regarding bills from these companies. ° °You will be contacted with the lab results as soon as they are available. The fastest way to get your results is to activate your My Chart account. Instructions are located on the last page of this paperwork. If you have not heard from us regarding the results in 2 weeks, please contact this office. °  ° ° ° °

## 2018-05-06 NOTE — Progress Notes (Signed)
Telemedicine Encounter- SOAP NOTE Established Patient  This telephone encounter was conducted with the patient's (or proxy's) verbal consent via audio telecommunications: yes/no: Yes Patient was instructed to have this encounter in a suitably private space; and to only have persons present to whom they give permission to participate. In addition, patient identity was confirmed by use of name plus two identifiers (DOB and address).  I discussed the limitations, risks, security and privacy concerns of performing an evaluation and management service by telephone and the availability of in person appointments. I also discussed with the patient that there may be a patient responsible charge related to this service. The patient expressed understanding and agreed to proceed.  I spent a total of TIME; 0 MIN TO 60 MIN: 25 minutes talking with the patient or their proxy.  No chief complaint on file.   Subjective   Robyn [name] is a 48 y.o. established patient. Telephone visit today for  HPI   Headache Pt reports six months of headaches  The headaches are daily and come and go throughout the day She states that the headache is behind the eyes She typically wakes up around 7:15am, morning routines, then she works on the computer for 8 hours  She wears glasses and just recently had an eye exam February 2020 She states that it can start first thing in the morning but is typically worse at the end of the day She takes aspirin which does not help She reports that the headaches are not relieved by anything She is not sleeping well through the night She is waking up every 2 hours at night  She does not sleep well throughout the night She tosses and turns at night She has OSA and uses her CPAP so she is still tossing and turning She had an appointment with Dr. Brett Fairy, Neurology, is setting up a home sleep study. She would rate the headache 7/10 She denies nausea, balance issues, vertigo or  vision changes.    CKD She has a GFR 44 and Cr 1.6 on 09/24/17   Chemistry      Component Value Date/Time   NA 138 09/24/2017 0949   K 4.3 09/24/2017 0949   CL 100 09/24/2017 0949   CO2 21 09/24/2017 0949   BUN 30 (H) 09/24/2017 0949   CREATININE 1.61 (H) 09/24/2017 0949   CREATININE 1.14 (H) 05/31/2012 1653      Component Value Date/Time   CALCIUM 10.3 (H) 09/24/2017 0949   ALKPHOS 78 09/24/2017 0949   AST 13 09/24/2017 0949   ALT 19 09/24/2017 0949   BILITOT 0.5 09/24/2017 0949     She is not taking tylenol She does not take nsaids She takes aspirin 162mg  daily for the headaches   Hot flashes  Patient reports that she is having night sweats and emotional changes She cries often and that is not like her She states that she feels like her mood is very low. She does not have the drive to do anything  She states that she either stress eats or does not eat She has poor appetite  Depression screen [facility] 2/9 05/06/2018 09/25/2017 09/24/2017 04/15/2017 04/12/2014  Decreased Interest 2 0 1 1 0  Down, Depressed, Hopeless 1 1 2 2  0  PHQ - 2 Score 3 1 3 3  0  Altered sleeping 3 3 3 3  -  Tired, decreased energy 3 3 3 3  -  Change in appetite 2 2 3 2  -  Feeling bad or  failure about yourself  2 1 1 2  -  Trouble concentrating 3 2 3 1  -  Moving slowly or fidgety/restless 2 2 2 3  -  Suicidal thoughts 0 0 0 0 -  PHQ-9 Score 18 14 18 17  -  Difficult doing work/chores Very difficult Somewhat difficult Very difficult Very difficult -    Patient Active Problem List   Diagnosis Date Noted  . Hot flashes due to menopause 04/27/2018  . Other insomnia 04/27/2018  . Poor compliance with CPAP treatment 04/27/2018  . Chronic nasal congestion 04/27/2018  . Uterine leiomyoma 02/06/2018  . Hypothyroidism 04/12/2014  . Hypersomnia, persistent 01/09/2014  . OSA on CPAP 01/09/2014  . Severe obesity (BMI >= 40) ([place]) 01/09/2014  . Unspecified sleep apnea 09/30/2012  . Positive Helicobacter pylori  titer 03/19/2012  . Chronic low back pain 01/22/2012  . Subclinical diabetes mellitus 01/22/2012  . Hypertension 12/28/2011  . Nephrolithiasis 10/15/2011  . Hypokalemia 08/22/2011  . CKD (chronic kidney disease), stage III ([place]) 08/22/2011  . Anxiety disorder 07/29/2011  . Tobacco user 07/29/2011    Past Medical History:  Diagnosis Date  . Abnormal Pap smear   . Allergy   . Anxiety    OCCAS PANIC ATTACKS  . Blood transfusion 1990'S  . Blood transfusion without reported diagnosis   . BV (bacterial vaginosis)   . Cough    nonproductive cough last 2 weeks  . Depression 2010  . Fibroid 2011  . GERD (gastroesophageal reflux disease)    occasional  . H/O dysmenorrhea   . H/O varicella   . H/O: menorrhagia 2011  . Hypertension   . Hypokalemia    PAST HX  . Increased BMI   . Kidney stones   . Obesity   . OSA (obstructive sleep apnea) 04/22/2013  . Ovarian cyst 2011  . Perimenopausal symptoms 07/15/2010  . Pregnancy induced hypertension   . Shortness of breath    ONLY WITH ANXIETY  . Sleep apnea    PT USES CPAP SOMETIMES - SETTING IS 3  . Weight loss 2010    Current Outpatient Medications  Medication Sig Dispense Refill  . allopurinol (ZYLOPRIM) 100 MG tablet Take 1 tablet by mouth daily.    Marland Kitchen amLODipine (NORVASC) 5 MG tablet Take 5 mg by mouth at bedtime.    . carvedilol (COREG) 25 MG tablet Take 1 tablet (25 mg total) by mouth 2 (two) times daily with a meal. 30 tablet 0  . chlorthalidone (HYGROTON) 25 MG tablet Take 25 mg by mouth daily.    . colchicine 0.6 MG tablet Take 0.6 mg by mouth daily.    . diclofenac (VOLTAREN) 75 MG EC tablet Take 1 tablet (75 mg total) by mouth 2 (two) times daily. 50 tablet 2  . levothyroxine (SYNTHROID, LEVOTHROID) 25 MCG tablet Take 1 tablet (25 mcg total) by mouth daily. 90 tablet 3  . lisinopril (PRINIVIL,ZESTRIL) 40 MG tablet Take 40 mg by mouth daily.    . mometasone (NASONEX) 50 MCG/ACT nasal spray Place 2 sprays into the nose daily.  17 g 12  . Multiple Vitamins-Minerals (MULTIVITAMIN WITH MINERALS) tablet Take 1 tablet by mouth daily.    Marland Kitchen spironolactone (ALDACTONE) 100 MG tablet Take 100 mg by mouth daily.    Marland Kitchen venlafaxine (EFFEXOR) 37.5 MG tablet TAKE 1 TABLET BY MOUTH TWICE A DAY 60 tablet 6   No current facility-administered medications for this visit.     Allergies  Allergen Reactions  . Sulfa Antibiotics Hives and Rash  Social History   Socioeconomic History  . Marital status: Married    Spouse name: Iona Beard  . Number of children: 2  . Years of education: 15+  . Highest education level: Not on file  Occupational History  . Occupation: PROGRAM Theme park manager: [employer]  Social Needs  . Financial resource strain: Not on file  . Food insecurity:    Worry: Not on file    Inability: Not on file  . Transportation needs:    Medical: Not on file    Non-medical: Not on file  Tobacco Use  . Smoking status: Former Smoker    Packs/day: 0.25    Years: 15.00    Pack years: 3.75  . Smokeless tobacco: Never Used  Substance and Sexual Activity  . Alcohol use: No  . Drug use: No  . Sexual activity: Yes    Birth control/protection: Surgical    Comment: BTL 1999  Lifestyle  . Physical activity:    Days per week: Not on file    Minutes per session: Not on file  . Stress: Not on file  Relationships  . Social connections:    Talks on phone: Not on file    Gets together: Not on file    Attends religious service: Not on file    Active member of club or organization: Not on file    Attends meetings of clubs or organizations: Not on file    Relationship status: Not on file  . Intimate partner violence:    Fear of current or ex partner: Not on file    Emotionally abused: Not on file    Physically abused: Not on file    Forced sexual activity: Not on file  Other Topics Concern  . Not on file  Social History Narrative   Patient is married Iona Beard) and lives at home with her husband and daughter.    Patient has two children.   Patient is working and attending college full-time.   Patient has a college education.   Patient is right-handed.   Patient drinks two cups of coffee daily and one cup of soda and tea daily.    ROS Review of Systems  Constitutional: Negative for activity change, appetite change, chills and fever.  HENT: Negative for congestion, nosebleeds, trouble swallowing and voice change.   Respiratory: Negative for cough, shortness of breath and wheezing.   Gastrointestinal: Negative for diarrhea, nausea and vomiting.  Genitourinary: Negative for difficulty urinating, dysuria, flank pain and hematuria.  Musculoskeletal: Negative for back pain, joint swelling and neck pain.  Neurological: Negative for dizziness, speech difficulty, light-headedness and numbness.  See HPI. All other review of systems negative.   Objective   Vitals as reported by the patient: Today's Vitals   05/06/18 0909  BP: 140/82  Weight: 284 lb (128.8 kg)  Height: 5\' 5"  (1.651 m)   Alert and oriented Normal pulmonary effort   Diagnoses and all orders for this visit:  Need for prophylactic measure -     Tdap vaccine greater than or equal to 7yo IM; Future  Screening for HIV (human immunodeficiency virus) -     HIV antibody (with reflex); Future  Morbid obesity ([place]) -     Vitamin D, 25-hydroxy; Future  Essential hypertension -     Comprehensive metabolic panel; Future   Plan   Hypertension bp is in a good range at 140/82 She is on amlodipine, coreg, spironolactone and chlorthalidone DASH diet and exercise  CKD stage 3 Discussed her bp and goals Advised pt to avoid NSAIDs   Daily headache Stop venlafaxine for one month to see if headaches are med side effect Continue to use some caffeine Use extra strength tylenol for headaches Continue hydration Avoid NSAID  OSA on CPAP Discussed her weight gain Advised weight loss program of exercise and dietary restrictions  Follow up with home sleep study   I discussed the assessment and treatment plan with the patient. The patient was provided an opportunity to ask questions and all were answered. The patient agreed with the plan and demonstrated an understanding of the instructions.   The patient was advised to call back or seek an in-person evaluation if the symptoms worsen or if the condition fails to improve as anticipated.  I provided 25 minutes of non-face-to-face time during this encounter.  Forrest Moron, MD  Primary Care at [facility]

## 2018-05-17 ENCOUNTER — Other Ambulatory Visit: Payer: Self-pay

## 2018-05-17 ENCOUNTER — Ambulatory Visit (INDEPENDENT_AMBULATORY_CARE_PROVIDER_SITE_OTHER): Payer: 59 | Admitting: Neurology

## 2018-05-17 ENCOUNTER — Ambulatory Visit (INDEPENDENT_AMBULATORY_CARE_PROVIDER_SITE_OTHER): Payer: 59 | Admitting: Family Medicine

## 2018-05-17 DIAGNOSIS — Z23 Encounter for immunization: Secondary | ICD-10-CM | POA: Diagnosis not present

## 2018-05-17 DIAGNOSIS — R0981 Nasal congestion: Secondary | ICD-10-CM

## 2018-05-17 DIAGNOSIS — I10 Essential (primary) hypertension: Secondary | ICD-10-CM

## 2018-05-17 DIAGNOSIS — N183 Chronic kidney disease, stage 3 unspecified: Secondary | ICD-10-CM

## 2018-05-17 DIAGNOSIS — Z299 Encounter for prophylactic measures, unspecified: Secondary | ICD-10-CM | POA: Diagnosis not present

## 2018-05-17 DIAGNOSIS — Z9114 Patient's other noncompliance with medication regimen: Secondary | ICD-10-CM

## 2018-05-17 DIAGNOSIS — Z114 Encounter for screening for human immunodeficiency virus [HIV]: Secondary | ICD-10-CM

## 2018-05-17 DIAGNOSIS — R51 Headache: Secondary | ICD-10-CM

## 2018-05-17 DIAGNOSIS — E034 Atrophy of thyroid (acquired): Secondary | ICD-10-CM

## 2018-05-17 DIAGNOSIS — R7303 Prediabetes: Secondary | ICD-10-CM

## 2018-05-17 DIAGNOSIS — G4733 Obstructive sleep apnea (adult) (pediatric): Secondary | ICD-10-CM

## 2018-05-17 DIAGNOSIS — Z720 Tobacco use: Secondary | ICD-10-CM

## 2018-05-17 DIAGNOSIS — R519 Headache, unspecified: Secondary | ICD-10-CM

## 2018-05-17 DIAGNOSIS — N951 Menopausal and female climacteric states: Secondary | ICD-10-CM

## 2018-05-17 DIAGNOSIS — G4709 Other insomnia: Secondary | ICD-10-CM

## 2018-05-17 DIAGNOSIS — G4710 Hypersomnia, unspecified: Secondary | ICD-10-CM

## 2018-05-18 LAB — COMPREHENSIVE METABOLIC PANEL
ALT: 38 IU/L — ABNORMAL HIGH (ref 0–32)
AST: 18 IU/L (ref 0–40)
Albumin/Globulin Ratio: 1.7 (ref 1.2–2.2)
Albumin: 4.3 g/dL (ref 3.8–4.8)
Alkaline Phosphatase: 78 IU/L (ref 39–117)
BUN/Creatinine Ratio: 13 (ref 9–23)
BUN: 28 mg/dL — ABNORMAL HIGH (ref 6–24)
Bilirubin Total: 0.5 mg/dL (ref 0.0–1.2)
CO2: 24 mmol/L (ref 20–29)
Calcium: 9.7 mg/dL (ref 8.7–10.2)
Chloride: 99 mmol/L (ref 96–106)
Creatinine, Ser: 2.12 mg/dL — ABNORMAL HIGH (ref 0.57–1.00)
GFR calc Af Amer: 31 mL/min/{1.73_m2} — ABNORMAL LOW (ref >59)
GFR calc non Af Amer: 27 mL/min/{1.73_m2} — ABNORMAL LOW (ref >59)
Globulin, Total: 2.5 g/dL (ref 1.5–4.5)
Glucose: 110 mg/dL — ABNORMAL HIGH (ref 65–99)
Potassium: 3.5 mmol/L (ref 3.5–5.2)
Sodium: 138 mmol/L (ref 134–144)
Total Protein: 6.8 g/dL (ref 6.0–8.5)

## 2018-05-18 LAB — TSH: TSH: 1.45 u[IU]/mL (ref 0.450–4.500)

## 2018-05-18 LAB — CBC
Hematocrit: 34.9 % (ref 34.0–46.6)
Hemoglobin: 12.3 g/dL (ref 11.1–15.9)
MCH: 31.5 pg (ref 26.6–33.0)
MCHC: 35.2 g/dL (ref 31.5–35.7)
MCV: 90 fL (ref 79–97)
Platelets: 286 10*3/uL (ref 150–450)
RBC: 3.9 x10E6/uL (ref 3.77–5.28)
RDW: 14.2 % (ref 11.7–15.4)
WBC: 8.2 10*3/uL (ref 3.4–10.8)

## 2018-05-18 LAB — HIV ANTIBODY (ROUTINE TESTING W REFLEX): HIV Screen 4th Generation wRfx: NONREACTIVE

## 2018-05-18 LAB — VITAMIN D 25 HYDROXY (VIT D DEFICIENCY, FRACTURES): Vit D, 25-Hydroxy: 28.6 ng/mL — ABNORMAL LOW (ref 30.0–100.0)

## 2018-05-19 ENCOUNTER — Other Ambulatory Visit: Payer: Self-pay | Admitting: Family Medicine

## 2018-05-19 ENCOUNTER — Encounter: Payer: Self-pay | Admitting: Family Medicine

## 2018-05-19 MED ORDER — LEVOTHYROXINE SODIUM 25 MCG PO TABS: 25.0000 ug | ORAL_TABLET | Freq: Every day | ORAL | 3 refills | Status: DC

## 2018-05-20 ENCOUNTER — Encounter: Payer: Self-pay | Admitting: Family Medicine

## 2018-05-25 ENCOUNTER — Encounter: Payer: Self-pay | Admitting: Family Medicine

## 2018-05-25 NOTE — Addendum Note (Signed)
Addended by: Larey Seat on: 05/25/2018 05:45 PM   Modules accepted: Orders

## 2018-05-25 NOTE — Procedures (Signed)
Patient Information     First Name: Robyn [name] Last Name: Courser ID: [id number]  Birth Date: 1970/04/14 Age: 48 Gender: Female  Referring Provider: Forrest Moron, MD BMI: 45.9 (W=275 lb, H=5' 5'')  Neck Circ.:  19 '' Epworth:  8/24  Sleep Study Information    Study Date: May 17, 2018 S/H/A Version: [id number] / 4.0.1515 / 35   History:  Mrs. Jerrilynn Mikowski had her sleep study at [facility] 2013. She was seen last in 2014, and in the meantime has become peri-menopausal, developed CKD III or IV and BMI is now 45 kg/m2.  She has difficulties with insomnia; also she states that originally her CPAP helped and she slept better. Over the years there have been times when she had low compliance and at one time she was convinced that the use of CPAP caused headaches.      Summary & Diagnosis:    Extremely fragmented sleep architecture.  Severe sleep apnea at AHI 38.6/h and in REM accentuated to 70.2/h.  Many brief periods of hypoxia followed the frequent apneic events.   Recommendations:     This degree of sleep apnea can only be treated with PAP - Positive Airway Pressure.  I like to start with an interface of patient's choice and comfort, pressure window of 6 through 18 cm water and 3 cm EPR, heated humidity.  PCP: Please consider referral to medical weight loss clinic.   Electronically Signed: Larey Seat, MD           Sleep Summary  Oxygen Saturation Statistics   Start Study Time: End Study Time: Total Recording Time:  9:40:59 PM 6:35:47 AM        8 hrs, 54 min  Total Sleep Time % REM of Sleep Time:  6 hrs, 48 min  20.0    Mean: 95 Minimum: 63 Maximum: 100  Mean of Desaturations Nadirs (%):   91  Oxygen Desaturation. %:   4-9 10-20 >20 Total  Events Number Total   115  12 89.1 9.3  2 1.6  129 100.0  Oxygen Saturation: <90 <=88 <85 <80 <70  Duration (minutes): Sleep % 9.8 2.4  7.1 2.8  1.7 0.7 1.5 0.4 0.6 0.2     Respiratory Indices    Total Events REM NREM All Night  pRDI:  278  pAHI:  259 ODI:  129  pAHIc:  2  % CSR: 0.0 70.2 70.2 54.1 0.0 34.4 30.9 10.7 0.4 41.4 38.6 19.2 0.3       Pulse Rate Statistics during Sleep (BPM)      Mean: 67 Minimum: 53 Maximum: 105    Indices are calculated using technically valid sleep time of  6 hrs, 43 min. pRDI/pAHI are calculated using oxi desaturations ? 3%  Body Position Statistics  Position Supine Prone Right Left Non-Supine  Sleep (min) 317.3 1.0 12.0 78.5 91.5  Sleep % 77.6 0.2 2.9 19.2 22.4  pRDI 39.4 N/A 35.0 49.4 48.6  pAHI 35.7 N/A 35.0 49.4 48.6  ODI 15.3 N/A 10.0 36.9 33.1     Snoring Statistics Snoring Level (dB) >40 >50 >60 >70 >80 >Threshold (45)  Sleep (min) 389.0 170.5 8.1 0.0 0.0 269.1  Sleep % 95.2 41.7 2.0 0.0 0.0 65.8    Mean: 49 dB Sleep Stages Chart    Patient Information     First Name: Robyn [name] Last Name: Vandenboom ID: [id number]  Birth Date: 03-19-1970 Age: 73 Gender: Female  Referring Provider: Delia Chimes  A, MD BMI: 45.9 (W=275 lb, H=5' 5'')  Neck Circ.:  19 '' Epworth:  8/24  Sleep Study Information    Study Date: May 17, 2018 S/H/A Version: 004.004.004.004 / 4.0.1515 / 29   History:  Mrs. Taran Hable had her sleep study at [facility] 2013. She was seen last in 2014, and in the meantime has become peri-menopausal, developed CKD III or IV and BMI is now 45 kg/m2.  She has difficulties with insomnia; also she states that originally her CPAP helped and she slept better. Over the years there have been times when she had low compliance and at one time she was convinced that the use of CPAP caused headaches.      Summary & Diagnosis:    Extremely fragmented sleep architecture.  Severe sleep apnea at AHI 38.6/h and in REM accentuated to 70.2/h.  Many brief periods of hypoxia followed the frequent apneic events.   Recommendations:     This degree of sleep apnea can only be treated with PAP - Positive Airway  Pressure.  I like to start with an interface of patient's choice and comfort, pressure window of 6 through 18 cm water and 3 cm EPR, heated humidity.  PCP: Please consider referral to medical weight loss clinic.   Electronically Signed: Larey Seat, MD           Sleep Summary  Oxygen Saturation Statistics   Start Study Time: End Study Time: Total Recording Time:  9:40:59 PM 6:35:47 AM        8 hrs, 54 min  Total Sleep Time % REM of Sleep Time:  6 hrs, 48 min  20.0    Mean: 95 Minimum: 63 Maximum: 100  Mean of Desaturations Nadirs (%):   91  Oxygen Desaturation. %:   4-9 10-20 >20 Total  Events Number Total   115  12 89.1 9.3  2 1.6  129 100.0  Oxygen Saturation: <90 <=88 <85 <80 <70  Duration (minutes): Sleep % 9.8 2.4  7.1 2.8  1.7 0.7 1.5 0.4 0.6 0.2     Respiratory Indices      Total Events REM NREM All Night  pRDI:  278  pAHI:  259 ODI:  129  pAHIc:  2  % CSR: 0.0 70.2 70.2 54.1 0.0 34.4 30.9 10.7 0.4 41.4 38.6 19.2 0.3       Pulse Rate Statistics during Sleep (BPM)      Mean: 67 Minimum: 53 Maximum: 105    Indices are calculated using technically valid sleep time of  6 hrs, 43 min. pRDI/pAHI are calculated using oxi desaturations ? 3%  Body Position Statistics  Position Supine Prone Right Left Non-Supine  Sleep (min) 317.3 1.0 12.0 78.5 91.5  Sleep % 77.6 0.2 2.9 19.2 22.4  pRDI 39.4 N/A 35.0 49.4 48.6  pAHI 35.7 N/A 35.0 49.4 48.6  ODI 15.3 N/A 10.0 36.9 33.1     Snoring Statistics Snoring Level (dB) >40 >50 >60 >70 >80 >Threshold (45)  Sleep (min) 389.0 170.5 8.1 0.0 0.0 269.1  Sleep % 95.2 41.7 2.0 0.0 0.0 65.8    Mean: 49 dB Sleep Stages Chart  pAHI=38.6                                                                       Mild              Moderate                    Severe                                                 5              15                     30  * Reference values are according to AASM guidelines                                                         pAHI=38.6                                                                       Mild              Moderate                    Severe                                                 5              15                    30  * Reference values are according to AASM guidelines

## 2018-05-26 ENCOUNTER — Telehealth: Payer: Self-pay | Admitting: Neurology

## 2018-05-26 NOTE — Telephone Encounter (Signed)
-----   Message from Larey Seat, MD sent at 05/25/2018  5:45 PM EDT ----- Extremely fragmented sleep architecture.  Severe sleep apnea at AHI 38.6/h and in REM accentuated to 70.2/h.  Many brief periods of hypoxia followed the frequent apneic events.   Recommendations:    This degree of sleep apnea can only be treated with PAP - Positive Airway Pressure.  I like to start with an interface of patient's choice and comfort, pressure window of 6 through 18 cm water and 3 cm EPR, heated humidity.  PCP: Please consider referral to medical weight loss clinic.  Also needs help with smoking cessation, and nasal patency.

## 2018-05-26 NOTE — Telephone Encounter (Signed)
Called patient to discuss sleep study results. No answer at this time. LVM for the patient to call back.   

## 2018-05-27 ENCOUNTER — Encounter: Payer: Self-pay | Admitting: Family Medicine

## 2018-05-27 ENCOUNTER — Encounter: Payer: Self-pay | Admitting: Neurology

## 2018-05-27 NOTE — Telephone Encounter (Signed)
Pt returned call. I advised pt that Dr. Brett Fairy reviewed their sleep study results and found that pt has severe apnea. Dr. Brett Fairy recommends that pt auto CPAP 6-18cm water pressure. I reviewed PAP compliance expectations with the pt. Pt is agreeable to starting a CPAP. I advised pt that an order will be sent to a DME, [facility], and [facility] will call the pt within about one week after they file with the pt's insurance. [facility] will show the pt how to use the machine, fit for masks, and troubleshoot the CPAP if needed. A follow up appt was made for insurance purposes with Ward Givens on 07/27/18. Pt verbalized understanding to arrive 15 minutes early and bring their CPAP. A letter with all of this information in it will be mailed to the pt as a reminder. I verified with the pt that the address we have on file is correct. Pt verbalized understanding of results. Pt had no questions at this time but was encouraged to call back if questions arise. I have sent the order to [facility] and have received confirmation that they have received the order.

## 2018-07-14 ENCOUNTER — Other Ambulatory Visit: Payer: Self-pay | Admitting: Sports Medicine

## 2018-07-14 DIAGNOSIS — M545 Low back pain, unspecified: Secondary | ICD-10-CM

## 2018-07-21 ENCOUNTER — Ambulatory Visit (INDEPENDENT_AMBULATORY_CARE_PROVIDER_SITE_OTHER): Payer: 59 | Admitting: Family Medicine

## 2018-07-25 ENCOUNTER — Encounter: Payer: Self-pay | Admitting: Neurology

## 2018-07-27 ENCOUNTER — Other Ambulatory Visit: Payer: Self-pay

## 2018-07-27 ENCOUNTER — Ambulatory Visit: Payer: 59 | Admitting: Adult Health

## 2018-07-27 ENCOUNTER — Encounter: Payer: Self-pay | Admitting: Adult Health

## 2018-07-27 VITALS — BP 138/93 | HR 69 | Temp 96.6°F | Ht 65.0 in | Wt 296.4 lb

## 2018-07-27 DIAGNOSIS — G4733 Obstructive sleep apnea (adult) (pediatric): Secondary | ICD-10-CM

## 2018-07-27 DIAGNOSIS — Z9989 Dependence on other enabling machines and devices: Secondary | ICD-10-CM | POA: Diagnosis not present

## 2018-07-27 NOTE — Progress Notes (Signed)
PATIENT: Robyn [name] DOB: Apr 13, 1970  REASON FOR VISIT: follow up HISTORY FROM: patient  HISTORY OF PRESENT ILLNESS: Today 07/27/18: Robyn [name] is a 48 year old female with a history of obstructive sleep apnea on CPAP.  She returns today for follow-up.  Her download indicates that she used her machine 30 out of 30 days for compliance of 100%.  She is averaging greater than 4 hours 21 days for compliance of 70%.  On average she uses her machine 4 hours and 46 minutes.  Her residual AHI is 0.3 on 6 to 18 cm of water with EPR 3.  She does not have a significant leak.  She states that the CPAP is working well for her.  She has not noticed much change during the day but does note that she sleeps better.  She has an appointment this week with healthy weight and wellness.  She returns today for follow-up.   HISTORY (Copied from Dr.Dohmeier's note)  Robyn [name] is a 48 y.o., right handed, AA female . She was last seen here as a referral  from Dr. Joseph Art after a sleep study on 09-10-12.!  In the interval there have been lots of progressions in her medical history :her obesity has progressed and her BMI is well over 45, she is now perimenopausal, she had chronic headaches which has improved over the last 4 weeks or so, has  insomnia ( not necessarily improved), she has hot flashes, chronic kidney disease stage III for which she is followed by Dr. Graylon Gunning,  she has nasal allergies with congestion.  She has difficulties now with insomnia; also she states that originally her CPAP helped and she slept better.  Over the years there have been times when she had low compliance and at one time she was convinced that the CPAP caused headaches.   Since January 2020-of this year- she has made a conscious effort again to use CPAP and she has noted that her sleep has improved very much over the last 4 weeks- which also seems to be coinciding with the coronavirus quarantine.  She has less  stress  working from home.   She sleeps better and her CPAP seems to work but she feels that her nose is blocked and she cannot get the optimal pressures . Often, in the middle of the night, she will remove the nasal pillows.  She feels that she cannot breathe well.   I was able to review her current compliance she has used the machine 90% of days and 65% compliant 4 hours and her residual AHI is very low at only 0.2/h.  The machine is older it is set at 10 cmH2O with 3 cm EPR and she is using nasal pillows but these do not always stay in place.  She had tried a fullface mask but it was very uncomfortable and created air leakage.  Her last sleep study is now 24 or 48 years old.   Sleep habits: dinnertime for the patient is between 7 and 8 PM usually she goes to bed by about 930 but she is often asleep as late as 1 AM and she does use her cell phone or tablet while in bed.  She has tried melatonin, Unisom and Benadryl but she has not made significant changes to her sleep hygiene otherwise.  If she falls asleep at midnight she will be up again within 2 hours due to nocturia at least twice each night.  She wakes up sweating and  feeling clammy which has been identified as a perimenopausal symptom.  Progesterone supplements have not helped and she was started by her primary care physician on venlafaxine, she still wakes up with hot flashes.  Rises now at 7 AM, working from home. TST 6 hours.  Reports less headaches since working from home.      Social history : The patient works for [employer] and is working from home, her daughter returned from [place] where she studied abroad during the call with crisis.  Her husband works half a day at the city of [employer] and for the other half of the day from home, she does not use tobacco, no alcohol and she drinks only 2 to 3 cups of coffee a day while at home.  She states that normally during the office day she would have 1 cup only.  Family history-is  negative for other members of the family with obstructive sleep apnea. Her parents had diabetes mellitus her mother also had hypertension and her father end-stage renal disease on hemodialysis for 8 years now.  Mrs. Zahniser was originally referred in Summer 2014 for the evaluation of possible sleep apnea.  The patient had been diagnosed with obstructive sleep apnea years prior and records were longer available for [place]. For a brief time she had tried to use CPAP in the past but the machine broke and she couldn't replace it for some time. She had endorsed the Epworth Sleepiness Scale at 15 of 24 points and the PHQ  9 at 15 points, indicating depression. Her BMI was 47.8. She had felt fatigued for years.   The patient was diagnosed with an AHI of 33.4 and the same RDI. REM sleep was not noted and in supine sleep her AHI was mildly worse at 40.0. She did not have the tract but desaturations of oxygen. Unfortunately, the Co2 monitor was not working that night.  The patient was titrated to 10 cm water CPAP with a nasal pillow. Compliance instructions were included under the recommendations. Positional therapy and weight loss were recommended. [facility] followed her over the CPAP use ,  the download was obtained and forwarded to [place] dated 03-07-13. The patient had 100% thirty- out of 30 day compliance, and  70% of days  for over 4 hours nightly use, The machine is set at 10 cm water pressure with an EPR of 2 cm water , her residual AHI is 0.4.  Based on compliance  the low AHI, the therapy has been very effective for the patient. Average daily use was 5 hours and 24 minutes, the median air leak was only 0.90 L / minute.   REVIEW OF SYSTEMS: Out of a complete 14 system review of symptoms, the patient complains only of the following symptoms, and all other reviewed systems are negative.  See HPI  ALLERGIES: Allergies  Allergen Reactions  . Sulfa Antibiotics Hives and Rash    HOME MEDICATIONS:  Outpatient Medications Prior to Visit  Medication Sig Dispense Refill  . acetaminophen (TYLENOL) 500 MG tablet Take 1 tablet (500 mg total) by mouth every 6 (six) hours as needed. 90 tablet 0  . allopurinol (ZYLOPRIM) 100 MG tablet Take 1 tablet by mouth daily.    Marland Kitchen amLODipine (NORVASC) 5 MG tablet Take 5 mg by mouth at bedtime.    . carvedilol (COREG) 25 MG tablet Take 1 tablet (25 mg total) by mouth 2 (two) times daily with a meal. 30 tablet 0  . chlorthalidone (  HYGROTON) 25 MG tablet Take 25 mg by mouth daily.    . colchicine 0.6 MG tablet Take 0.6 mg by mouth daily.    . diclofenac (VOLTAREN) 75 MG EC tablet Take 1 tablet (75 mg total) by mouth 2 (two) times daily. 50 tablet 2  . levothyroxine (SYNTHROID) 25 MCG tablet Take 1 tablet (25 mcg total) by mouth daily. 90 tablet 3  . lisinopril (PRINIVIL,ZESTRIL) 40 MG tablet Take 40 mg by mouth daily.    . Multiple Vitamins-Minerals (MULTIVITAMIN WITH MINERALS) tablet Take 1 tablet by mouth daily.    Marland Kitchen spironolactone (ALDACTONE) 100 MG tablet Take 100 mg by mouth daily.    . mometasone (NASONEX) 50 MCG/ACT nasal spray Place 2 sprays into the nose daily. 17 g 12  . venlafaxine (EFFEXOR) 37.5 MG tablet TAKE 1 TABLET BY MOUTH TWICE A DAY 60 tablet 6   No facility-administered medications prior to visit.     PAST MEDICAL HISTORY: Past Medical History:  Diagnosis Date  . Abnormal Pap smear   . Allergy   . Anxiety    OCCAS PANIC ATTACKS  . Blood transfusion 1990'S  . Blood transfusion without reported diagnosis   . BV (bacterial vaginosis)   . Cough    nonproductive cough last 2 weeks  . Depression 2010  . Fibroid 2011  . GERD (gastroesophageal reflux disease)    occasional  . H/O dysmenorrhea   . H/O varicella   . H/O: menorrhagia 2011  . Hypertension   . Hypokalemia    PAST HX  . Increased BMI   . Kidney stones   . Obesity   . OSA (obstructive sleep apnea) 04/22/2013  . Ovarian cyst 2011  . Perimenopausal symptoms 07/15/2010   . Pregnancy induced hypertension   . Shortness of breath    ONLY WITH ANXIETY  . Sleep apnea    PT USES CPAP SOMETIMES - SETTING IS 3  . Weight loss 2010    PAST SURGICAL HISTORY: Past Surgical History:  Procedure Laterality Date  . C-SECTIONS X 2    . [place]  . CHOLECYSTECTOMY  1994  . NEPHROLITHOTOMY  04/09/2011   Procedure: NEPHROLITHOTOMY PERCUTANEOUS;  Surgeon: Molli Hazard, MD;  Location: [facility];  Service: Urology;  Laterality: Right;      . NEPHROLITHOTOMY  10/15/2011   Procedure: NEPHROLITHOTOMY PERCUTANEOUS;  Surgeon: Molli Hazard, MD;  Location: [facility];  Service: Urology;  Laterality: Left;  . PERCUTANEOUS NEPHROSTOMY AROUND 1996  may 2013   right kidney  . REMOVAL OF STONES  10/15/2011   Procedure: REMOVAL OF STONES;  Surgeon: Molli Hazard, MD;  Location: [facility];  Service: Urology;  Laterality: Left;  . TUBAL LIGATION  1999  . URETEROSCOPY  04/09/2011   Procedure: URETEROSCOPY;  Surgeon: Molli Hazard, MD;  Location: [facility];  Service: Urology;  Laterality: Right;  . UTERINE ABLATION MARCH 2010  march 2011    FAMILY HISTORY: Family History  Problem Relation Age of Onset  . Diabetes Mother   . Hypertension Mother   . Hypertension Father   . Diabetes Father   . Kidney disease Father   . Diabetes Paternal Grandmother   . Diabetes Maternal Grandmother   . Heart disease Maternal Grandmother     SOCIAL HISTORY: Social History   Socioeconomic History  . Marital status: Married    Spouse name: Iona Beard  . Number of children: 2  . Years of education: 15+  . Highest  education level: Not on file  Occupational History  . Occupation: PROGRAM Theme park manager: [employer]  Social Needs  . Financial resource strain: Not on file  . Food insecurity    Worry: Not on file    Inability: Not on file  . Transportation needs    Medical: Not on file    Non-medical: Not on file  Tobacco Use  . Smoking status:  Former Smoker    Packs/day: 0.25    Years: 15.00    Pack years: 3.75  . Smokeless tobacco: Never Used  Substance and Sexual Activity  . Alcohol use: No  . Drug use: No  . Sexual activity: Yes    Birth control/protection: Surgical    Comment: BTL 1999  Lifestyle  . Physical activity    Days per week: Not on file    Minutes per session: Not on file  . Stress: Not on file  Relationships  . Social Herbalist on phone: Not on file    Gets together: Not on file    Attends religious service: Not on file    Active member of club or organization: Not on file    Attends meetings of clubs or organizations: Not on file    Relationship status: Not on file  . Intimate partner violence    Fear of current or ex partner: Not on file    Emotionally abused: Not on file    Physically abused: Not on file    Forced sexual activity: Not on file  Other Topics Concern  . Not on file  Social History Narrative   Patient is married Iona Beard) and lives at home with her husband and daughter.   Patient has two children.   Patient is working and attending college full-time.   Patient has a college education.   Patient is right-handed.   Patient drinks two cups of coffee daily and one cup of soda and tea daily.      PHYSICAL EXAM  Vitals:   07/27/18 0753  BP: (!) 138/93  Pulse: 69  Temp: (!) 96.6 F (35.9 C)  Weight: 296 lb 6.4 oz (134.4 kg)  Height: 5\' 5"  (1.651 m)   Body mass index is 49.32 kg/m.  Generalized: Well developed, in no acute distress  Chest: Lungs clear to auscultation bilaterally  Neurological examination  Mentation: Alert oriented to time, place, history taking. Follows all commands speech and language fluent Cranial nerve II-XII: Pupils were equal round reactive to light. Extraocular movements were full, visual field were full on confrontational test. Facial sensation and strength were normal. Uvula tongue midline. Head turning and shoulder shrug  were normal  and symmetric.  Mallampati 4+ Motor: The motor testing reveals 5 over 5 strength of all 4 extremities. Good symmetric motor tone is noted throughout.  Sensory: Sensory testing is intact to soft touch on all 4 extremities. No evidence of extinction is noted.  Coordination: Cerebellar testing reveals good finger-nose-finger and heel-to-shin bilaterally.  Gait and station: Gait is normal.    DIAGNOSTIC DATA (LABS, IMAGING, TESTING) - I reviewed patient records, labs, notes, testing and imaging myself where available.  Lab Results  Component Value Date   WBC 8.2 05/17/2018   HGB 12.3 05/17/2018   HCT 34.9 05/17/2018   MCV 90 05/17/2018   PLT 286 05/17/2018      Component Value Date/Time   NA 138 05/17/2018 1557   K 3.5 05/17/2018 1557   CL 99  05/17/2018 1557   CO2 24 05/17/2018 1557   GLUCOSE 110 (H) 05/17/2018 1557   GLUCOSE 98 07/25/2012 1350   BUN 28 (H) 05/17/2018 1557   CREATININE 2.12 (H) 05/17/2018 1557   CREATININE 1.14 (H) 05/31/2012 1653   CALCIUM 9.7 05/17/2018 1557   PROT 6.8 05/17/2018 1557   ALBUMIN 4.3 05/17/2018 1557   AST 18 05/17/2018 1557   ALT 38 (H) 05/17/2018 1557   ALKPHOS 78 05/17/2018 1557   BILITOT 0.5 05/17/2018 1557   GFRNONAA 27 (L) 05/17/2018 1557   GFRAA 31 (L) 05/17/2018 1557   Lab Results  Component Value Date   CHOL 188 08/20/2010   HDL 39 (L) 08/20/2010   LDLCALC 78 08/20/2010   TRIG 355 (H) 08/20/2010   CHOLHDL 4.8 08/20/2010   Lab Results  Component Value Date   HGBA1C 5.3 09/24/2017   No results found for: VITAMINB12 Lab Results  Component Value Date   TSH 1.450 05/17/2018      ASSESSMENT AND PLAN 48 y.o. year old female  has a past medical history of Abnormal Pap smear, Allergy, Anxiety, Blood transfusion (1990'S), Blood transfusion without reported diagnosis, BV (bacterial vaginosis), Cough, Depression (2010), Fibroid (2011), GERD (gastroesophageal reflux disease), H/O dysmenorrhea, H/O varicella, H/O: menorrhagia (2011),  Hypertension, Hypokalemia, Increased BMI, Kidney stones, Obesity, OSA (obstructive sleep apnea) (04/22/2013), Ovarian cyst (2011), Perimenopausal symptoms (07/15/2010), Pregnancy induced hypertension, Shortness of breath, Sleep apnea, and Weight loss (2010). here with:  1.  Obstructive sleep apnea on CPAP  Patient CPAP download shows excellent compliance and good treatment of her apnea.  She is encouraged to continue using CPAP nightly and greater than 4 hours each night.  She is advised that if her symptoms worsen or she develops new symptoms she should let us know.  She will follow-up in 6 months with my chart visit.    I spent 15 minutes with the patient. 50% of this time was spent reviewing her CPAP download   Ward Givens, MSN, NP-C 07/27/2018, 8:08 AM [facility] [address] [place], [place] [address] [phone]

## 2018-07-27 NOTE — Patient Instructions (Signed)
Continue using CPAP nightly and greater than 4 hours each night °If your symptoms worsen or you develop new symptoms please let us know.  ° °

## 2018-07-28 ENCOUNTER — Ambulatory Visit (INDEPENDENT_AMBULATORY_CARE_PROVIDER_SITE_OTHER): Payer: 59 | Admitting: Family Medicine

## 2018-07-28 ENCOUNTER — Encounter (INDEPENDENT_AMBULATORY_CARE_PROVIDER_SITE_OTHER): Payer: Self-pay | Admitting: Family Medicine

## 2018-07-28 ENCOUNTER — Other Ambulatory Visit: Payer: Self-pay

## 2018-07-28 VITALS — BP 117/79 | HR 70 | Temp 98.3°F | Ht 64.0 in | Wt 294.0 lb

## 2018-07-28 DIAGNOSIS — Z9189 Other specified personal risk factors, not elsewhere classified: Secondary | ICD-10-CM

## 2018-07-28 DIAGNOSIS — R5383 Other fatigue: Secondary | ICD-10-CM | POA: Diagnosis not present

## 2018-07-28 DIAGNOSIS — Z1331 Encounter for screening for depression: Secondary | ICD-10-CM | POA: Diagnosis not present

## 2018-07-28 DIAGNOSIS — R7303 Prediabetes: Secondary | ICD-10-CM | POA: Diagnosis not present

## 2018-07-28 DIAGNOSIS — I10 Essential (primary) hypertension: Secondary | ICD-10-CM | POA: Diagnosis not present

## 2018-07-28 DIAGNOSIS — R0602 Shortness of breath: Secondary | ICD-10-CM

## 2018-07-28 DIAGNOSIS — Z6841 Body Mass Index (BMI) 40.0 and over, adult: Secondary | ICD-10-CM

## 2018-07-28 DIAGNOSIS — G4733 Obstructive sleep apnea (adult) (pediatric): Secondary | ICD-10-CM | POA: Diagnosis not present

## 2018-07-28 DIAGNOSIS — E038 Other specified hypothyroidism: Secondary | ICD-10-CM

## 2018-07-28 DIAGNOSIS — Z0289 Encounter for other administrative examinations: Secondary | ICD-10-CM

## 2018-07-29 ENCOUNTER — Ambulatory Visit (INDEPENDENT_AMBULATORY_CARE_PROVIDER_SITE_OTHER): Payer: 59 | Admitting: Psychology

## 2018-07-29 DIAGNOSIS — F3289 Other specified depressive episodes: Secondary | ICD-10-CM | POA: Diagnosis not present

## 2018-07-29 LAB — INSULIN, RANDOM: INSULIN: 56.2 u[IU]/mL — ABNORMAL HIGH (ref 2.6–24.9)

## 2018-07-29 LAB — COMPREHENSIVE METABOLIC PANEL
ALT: 27 IU/L (ref 0–32)
AST: 12 IU/L (ref 0–40)
Albumin/Globulin Ratio: 1.5 (ref 1.2–2.2)
Albumin: 4.3 g/dL (ref 3.8–4.8)
Alkaline Phosphatase: 82 IU/L (ref 39–117)
BUN/Creatinine Ratio: 20 (ref 9–23)
BUN: 30 mg/dL — ABNORMAL HIGH (ref 6–24)
Bilirubin Total: 0.5 mg/dL (ref 0.0–1.2)
CO2: 21 mmol/L (ref 20–29)
Calcium: 9.6 mg/dL (ref 8.7–10.2)
Chloride: 99 mmol/L (ref 96–106)
Creatinine, Ser: 1.53 mg/dL — ABNORMAL HIGH (ref 0.57–1.00)
GFR calc Af Amer: 46 mL/min/{1.73_m2} — ABNORMAL LOW (ref >59)
GFR calc non Af Amer: 40 mL/min/{1.73_m2} — ABNORMAL LOW (ref >59)
Globulin, Total: 2.8 g/dL (ref 1.5–4.5)
Glucose: 127 mg/dL — ABNORMAL HIGH (ref 65–99)
Potassium: 4.4 mmol/L (ref 3.5–5.2)
Sodium: 138 mmol/L (ref 134–144)
Total Protein: 7.1 g/dL (ref 6.0–8.5)

## 2018-07-29 LAB — LIPID PANEL WITH LDL/HDL RATIO
Cholesterol, Total: 255 mg/dL — ABNORMAL HIGH (ref 100–199)
HDL: 50 mg/dL (ref >39)
LDL Calculated: 155 mg/dL — ABNORMAL HIGH (ref 0–99)
LDl/HDL Ratio: 3.1 ratio (ref 0.0–3.2)
Triglycerides: 248 mg/dL — ABNORMAL HIGH (ref 0–149)
VLDL Cholesterol Cal: 50 mg/dL — ABNORMAL HIGH (ref 5–40)

## 2018-07-29 LAB — HEMOGLOBIN A1C
Est. average glucose Bld gHb Est-mCnc: 146 mg/dL
Hgb A1c MFr Bld: 6.7 % — ABNORMAL HIGH (ref 4.8–5.6)

## 2018-07-29 LAB — VITAMIN B12: Vitamin B-12: 615 pg/mL (ref 232–1245)

## 2018-07-29 LAB — FOLATE: Folate: 7.8 ng/mL (ref >3.0)
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
## 2018-07-29 NOTE — Progress Notes (Signed)
Office: [phone]  /  Fax: [phone]   Dear Dr. Asencion Partridge Dohmeier,   Thank you for referring Robyn [name] to our clinic. The following note includes my evaluation and treatment recommendations.  HPI:   Chief Complaint: OBESITY    [name] has been referred by Dr. Asencion Partridge Dohmeier for consultation regarding her obesity and obesity related comorbidities.    Robyn [name] (MR# [id number]) is a 48 y.o. female who presents on 07/29/2018 for obesity evaluation and treatment. Current BMI is Body mass index is 50.46 kg/m.[name] has been struggling with her weight for many years and has been unsuccessful in either losing weight, maintaining weight loss, or reaching her healthy weight goal.     [name] attended our information session and states she is currently in the action stage of change and ready to dedicate time achieving and maintaining a healthier weight. [name] is interested in becoming our patient and working on intensive lifestyle modifications including (but not limited to) diet, exercise and weight loss.     [name] states for breakfast she eats 3 pieces of pork bacon, 2-3 eggs with cheese and 16 ounces of juice and is satisfied. Occasionally she will snack on 5-6 cookies when hungry. For lunch she eats [employer] chicken sandwich, waffle fries, drink, and sauces (finishes it and feels full). She then snacks on almonds and cherries to satisfy until dinner. For dinner she eats 2 fried chicken legs, 2 cups baked macaroni & cheese, 1 cup of broccoli, and 1 biscuit. After dinner she reports eating a family bag of popcorn or 2 cups of ice cream. She states she skips breakfast or lunch 3-4 times a week.    [name] states her family eats meals together sometimes she thinks her family will eat healthier with her maybe her desired weight loss is 124 lbs she started gaining weight in 1991 after high school her heaviest weight ever was 300 lbs. she has significant  food cravings issues  she snacks frequently in the evenings she wakes up frquently in the middle of the night to eat she skips meals frequently she is frequently drinking liquids with calories she frequently makes poor food choices she has problems with excessive hunger  she struggles with emotional eating    [name] feels her energy is lower than it should be. This has worsened with weight gain and has worsened recently. [name] admits to daytime somnolence and admits to waking up still tired. Patient is at risk for obstructive sleep apnea. Patent has a history of symptoms of daytime fatigue and morning headache. Patient generally gets 5-7 hours of sleep per night, and states they sometimes sleep well most nights. Snoring is present. Apneic episodes are present. Epworth Sleepiness Score is 9.  Dyspnea on exertion [name] notes increasing shortness of breath with exercising and seems to be worsening over time with weight gain. She notes getting out of breath sooner with activity than she used to. This has gotten worse recently. [name] denies orthopnea.  Obstructive Sleep Apnea (OSA) [name] sees Dr. Brett Fairy. She reports wearing a CPAP.  Hypothyroidism [name] has a diagnosis of hypothyroidism, which was diagnosed approximately 4 years ago. She is on levothyroxine 25 mcg. She denies hot or cold intolerance or palpitations.  Hypertension [name] is a 48 y.o. female with hypertension and is on Lisinopril, chlorthalidone, Carvedilol, spironolactone, and amlodipine.  [name] denies chest pain or shortness of breath on exertion. She is working weight loss to help control her  blood pressure with the goal of decreasing her risk of heart attack and stroke. [name]'s blood pressure is controlled today.  At risk for cardiovascular disease Faylynn is at a higher than average risk for cardiovascular disease due to obesity. She currently denies any chest  pain.  Pre-Diabetes [name] was diagnosed with prediabetes last year based on her elevated Hgb A1c and was informed this puts her at greater risk of developing diabetes. Her POC HbA1c was 5.3 on 09/24/2017. She is not taking metformin currently and continues to work on diet and exercise to decrease risk of diabetes. She denies nausea or hypoglycemia.  Depression Screen [name]'s Food and Mood (modified PHQ-9) score was 23. Depression screen [facility] 2/9 07/28/2018  Decreased Interest 3  Down, Depressed, Hopeless 3  PHQ - 2 Score 6  Altered sleeping 3  Tired, decreased energy 3  Change in appetite 3  Feeling bad or failure about yourself  3  Trouble concentrating 3  Moving slowly or fidgety/restless 2  Suicidal thoughts 0  PHQ-9 Score 23  Difficult doing work/chores Very difficult   ASSESSMENT AND PLAN:  Other fatigue - Plan: EKG 12-Lead, Vitamin B12, Folate  Shortness of breath on exertion - Plan: Lipid Panel With LDL/HDL Ratio, ECHOCARDIOGRAM COMPLETE  OSA (obstructive sleep apnea)  Other specified hypothyroidism  Essential hypertension  Prediabetes - Plan: Comprehensive metabolic panel, Hemoglobin A1c, Insulin, random  Depression screening  At risk for heart disease  Class 3 severe obesity with serious comorbidity and body mass index (BMI) of 50.0 to 59.9 in adult, unspecified obesity type (HCC)  PLAN:  Fatigue [name] was informed that her fatigue may be related to obesity, depression or many other causes. Labs will be ordered, and in the meanwhile [name] has agreed to work on diet, exercise and weight loss to help with fatigue. Proper sleep hygiene was discussed including the need for 7-8 hours of quality sleep each night. A sleep study was not ordered based on symptoms and Epworth score. EKG showed right bundle branch block.  Dyspnea on exertion [name]'s shortness of breath appears to be obesity related and exercise induced. She has agreed to work on weight  loss and gradually increase exercise to treat her exercise induced shortness of breath. If [name] follows our instructions and loses weight without improvement of her shortness of breath, we will plan to refer to pulmonology. We will monitor this condition regularly. Yarisa agrees to this plan.  Obstructive Sleep Apnea (OSA) Alexxia was instructed to follow-up with Dr. Brett Fairy.  Hypothyroidism Zunairah was informed of the importance of good thyroid control to help with weight loss efforts. She was also informed that supertheraputic thyroid levels are dangerous and will not improve weight loss results. Iridiana will have thyroid panel done today.  Hypertension We discussed sodium restriction, working on healthy weight loss, and a regular exercise program as the means to achieve improved blood pressure control. Khaliya agreed with this plan and agreed to follow up as directed. We will continue to monitor her blood pressure as well as her progress with the above lifestyle modifications. She will continue current management and will watch for signs of hypotension as she continues her lifestyle modifications. She will have EKG, CMP and FLP performed today.  Cardiovascular risk counseling Areana was given extended (15 minutes) coronary artery disease prevention counseling today. She is 48 y.o. female and has risk factors for heart disease including obesity. We discussed intensive lifestyle modifications today with an emphasis on specific weight loss instructions and strategies. Pt  was also informed of the importance of increasing exercise and decreasing saturated fats to help prevent heart disease.  Pre-Diabetes Ulah will continue to work on weight loss, exercise, and decreasing simple carbohydrates in her diet to help decrease the risk of diabetes. We dicussed metformin including benefits and risks. She was informed that eating too many simple carbohydrates or too many calories at one  sitting increases the likelihood of GI side effects. Mishaal will have HgA1c and insulin drawn today. She will follow-up with our clinic in 2 weeks to monitor her progress.  Depression Screen Ozetta had a strongly positive depression screening. Depression is commonly associated with obesity and often results in emotional eating behaviors. We will monitor this closely and work on CBT to help improve the non-hunger eating patterns. Referral to Psychology may be required if no improvement is seen as she continues in our clinic.  Obesity Nazli is currently in the action stage of change and her goal is to continue with weight loss efforts. I recommend Nami begin the structured treatment plan as follows:  She has agreed to follow the category 3 plan.  Neddie has been instructed to eventually work up to a goal of 150 minutes of combined cardio and strengthening exercise per week for weight loss and overall health benefits. We discussed the following Behavioral Modification Strategies today: increasing lean protein intake, increasing vegetables, work on meal planning and easy cooking plans, keeping healthy foods in the home, and planning for success.   She was informed of the importance of frequent follow-up visits to maximize her success with intensive lifestyle modifications for her multiple health conditions. She was informed we would discuss her lab results at her next visit unless there is a critical issue that needs to be addressed sooner. Kayela agreed to keep her next visit at the agreed upon time to discuss these results.  ALLERGIES: Allergies  Allergen Reactions  . Sulfa Antibiotics Hives and Rash    MEDICATIONS: Current Outpatient Medications on File Prior to Visit  Medication Sig Dispense Refill  . allopurinol (ZYLOPRIM) 100 MG tablet Take 1 tablet by mouth daily.    Marland Kitchen amLODipine (NORVASC) 5 MG tablet Take 5 mg by mouth at bedtime.    . carvedilol (COREG) 25 MG  tablet Take 1 tablet (25 mg total) by mouth 2 (two) times daily with a meal. 30 tablet 0  . chlorthalidone (HYGROTON) 25 MG tablet Take 25 mg by mouth daily.    . colchicine 0.6 MG tablet Take 0.6 mg by mouth daily.    Marland Kitchen levothyroxine (SYNTHROID) 25 MCG tablet Take 1 tablet (25 mcg total) by mouth daily. 90 tablet 3  . lisinopril (PRINIVIL,ZESTRIL) 40 MG tablet Take 40 mg by mouth daily.    Marland Kitchen spironolactone (ALDACTONE) 100 MG tablet Take 100 mg by mouth daily.    Marland Kitchen acetaminophen (TYLENOL) 500 MG tablet Take 1 tablet (500 mg total) by mouth every 6 (six) hours as needed. (Patient not taking: Reported on 07/28/2018) 90 tablet 0  . diclofenac (VOLTAREN) 75 MG EC tablet Take 1 tablet (75 mg total) by mouth 2 (two) times daily. (Patient not taking: Reported on 07/28/2018) 50 tablet 2  . Multiple Vitamins-Minerals (MULTIVITAMIN WITH MINERALS) tablet Take 1 tablet by mouth daily.     No current facility-administered medications on file prior to visit.     PAST MEDICAL HISTORY: Past Medical History:  Diagnosis Date  . Abnormal Pap smear   . Allergy   . Anxiety  OCCAS PANIC ATTACKS  . Back pain   . Bilateral swelling of feet   . Blood transfusion 1990'S  . Blood transfusion without reported diagnosis   . BV (bacterial vaginosis)   . Cough    nonproductive cough last 2 weeks  . Depression 2010  . Fibroid 2011  . Gallbladder problem   . GERD (gastroesophageal reflux disease)    occasional  . H/O dysmenorrhea   . H/O varicella   . H/O: menorrhagia 2011  . Hypertension   . Hypokalemia    PAST HX  . Hypothyroid   . Increased BMI   . Kidney problem   . Kidney stones   . Obesity   . OSA (obstructive sleep apnea) 04/22/2013  . Ovarian cyst 2011  . Perimenopausal symptoms 07/15/2010  . Pre-diabetes   . Pregnancy induced hypertension   . Shortness of breath    ONLY WITH ANXIETY  . Sleep apnea    PT USES CPAP SOMETIMES - SETTING IS 3  . Vitamin D deficiency   . Weight loss 2010     PAST SURGICAL HISTORY: Past Surgical History:  Procedure Laterality Date  . C-SECTIONS X 2    . [place]  . CHOLECYSTECTOMY  1994  . NEPHROLITHOTOMY  04/09/2011   Procedure: NEPHROLITHOTOMY PERCUTANEOUS;  Surgeon: Molli Hazard, MD;  Location: [facility];  Service: Urology;  Laterality: Right;      . NEPHROLITHOTOMY  10/15/2011   Procedure: NEPHROLITHOTOMY PERCUTANEOUS;  Surgeon: Molli Hazard, MD;  Location: [facility];  Service: Urology;  Laterality: Left;  . PERCUTANEOUS NEPHROSTOMY AROUND 1996  may 2013   right kidney  . REMOVAL OF STONES  10/15/2011   Procedure: REMOVAL OF STONES;  Surgeon: Molli Hazard, MD;  Location: [facility];  Service: Urology;  Laterality: Left;  . TUBAL LIGATION  1999  . URETEROSCOPY  04/09/2011   Procedure: URETEROSCOPY;  Surgeon: Molli Hazard, MD;  Location: [facility];  Service: Urology;  Laterality: Right;  . UTERINE ABLATION MARCH 2010  march 2011    SOCIAL HISTORY: Social History   Tobacco Use  . Smoking status: Former Smoker    Packs/day: 0.25    Years: 15.00    Pack years: 3.75  . Smokeless tobacco: Never Used  Substance Use Topics  . Alcohol use: No  . Drug use: No    FAMILY HISTORY: Family History  Problem Relation Age of Onset  . Diabetes Mother   . Hypertension Mother   . Obesity Mother   . Hypertension Father   . Diabetes Father   . Kidney disease Father   . Stroke Father   . Sleep apnea Father   . Diabetes Paternal Grandmother   . Diabetes Maternal Grandmother   . Heart disease Maternal Grandmother    ROS: Review of Systems  Cardiovascular: Negative for palpitations.  Endo/Heme/Allergies:       Negative for heat or cold intolerance.   PHYSICAL EXAM: Blood pressure 117/79, pulse 70, temperature 98.3 F (36.8 C), temperature source Oral, height 5\' 4"  (1.626 m), weight 294 lb (133.4 kg), SpO2 95 %. Body mass index is 50.46 kg/m. Physical Exam Vitals signs reviewed.   Constitutional:      Appearance: Normal appearance. She is well-developed. She is obese.  HENT:     Head: Normocephalic and atraumatic.     Nose: Nose normal.  Eyes:     General: No scleral icterus. Neck:     Musculoskeletal: Normal range of  motion.  Cardiovascular:     Rate and Rhythm: Normal rate and regular rhythm.     Heart sounds: Murmur (2/6 crescendo systolic murmur heard best at the aortic area) present.  Pulmonary:     Effort: Pulmonary effort is normal. No respiratory distress.  Abdominal:     Palpations: Abdomen is soft.     Tenderness: There is no abdominal tenderness.  Musculoskeletal: Normal range of motion.     Comments: Range of motion normal in all four extremities.  Skin:    General: Skin is warm and dry.  Neurological:     Mental Status: She is alert and oriented to person, place, and time.     Coordination: Coordination normal.  Psychiatric:        Mood and Affect: Mood and affect normal.        Behavior: Behavior normal.   RECENT LABS AND TESTS: BMET    Component Value Date/Time   NA 138 05/17/2018 1557   K 3.5 05/17/2018 1557   CL 99 05/17/2018 1557   CO2 24 05/17/2018 1557   GLUCOSE 110 (H) 05/17/2018 1557   GLUCOSE 98 07/25/2012 1350   BUN 28 (H) 05/17/2018 1557   CREATININE 2.12 (H) 05/17/2018 1557   CREATININE 1.14 (H) 05/31/2012 1653   CALCIUM 9.7 05/17/2018 1557   GFRNONAA 27 (L) 05/17/2018 1557   GFRAA 31 (L) 05/17/2018 1557   Lab Results  Component Value Date   HGBA1C 5.3 09/24/2017   No results found for: INSULIN CBC    Component Value Date/Time   WBC 8.2 05/17/2018 1557   WBC 7.9 09/24/2017 0937   WBC 7.3 07/25/2012 1350   RBC 3.90 05/17/2018 1557   RBC 4.13 09/24/2017 0937   RBC 4.23 07/25/2012 1350   HGB 12.3 05/17/2018 1557   HCT 34.9 05/17/2018 1557   PLT 286 05/17/2018 1557   MCV 90 05/17/2018 1557   MCH 31.5 05/17/2018 1557   MCH 31.3 (A) 09/24/2017 0937   MCH 29.8 07/25/2012 1350   MCHC 35.2 05/17/2018 1557    MCHC 34.0 09/24/2017 0937   MCHC 33.9 07/25/2012 1350   RDW 14.2 05/17/2018 1557   LYMPHSABS 1.9 06/01/2012 1920   MONOABS 0.6 06/01/2012 1920   EOSABS 0.5 06/01/2012 1920   BASOSABS 0.0 06/01/2012 1920   Iron/TIBC/Ferritin/ %Sat No results found for: IRON, TIBC, FERRITIN, IRONPCTSAT Lipid Panel     Component Value Date/Time   CHOL 188 08/20/2010 0453   TRIG 355 (H) 08/20/2010 0453   HDL 39 (L) 08/20/2010 0453   CHOLHDL 4.8 08/20/2010 0453   VLDL 71 (H) 08/20/2010 0453   LDLCALC 78 08/20/2010 0453   Hepatic Function Panel     Component Value Date/Time   PROT 6.8 05/17/2018 1557   ALBUMIN 4.3 05/17/2018 1557   AST 18 05/17/2018 1557   ALT 38 (H) 05/17/2018 1557   ALKPHOS 78 05/17/2018 1557   BILITOT 0.5 05/17/2018 1557      Component Value Date/Time   TSH 1.450 05/17/2018 1557   TSH 2.400 04/15/2017 0932   TSH 1.535 04/12/2014 1228   Results for ANANIAH, MACIOLEK (MRN [id number]) as of 07/29/2018 07:05  Ref. Range 05/17/2018 15:57  Vitamin D, 25-Hydroxy Latest Ref Range: 30.0 - 100.0 ng/mL 28.6 (L)    ECG shows sinus rhythm with a rate of 70 BPM, right bundle branch block, abnormal.    INDIRECT CALORIMETER done today shows a VO2 of 267 and a REE of 1863. Her calculated basal metabolic rate  is 2072 thus her basal metabolic rate is worse than expected.  OBESITY BEHAVIORAL INTERVENTION VISIT  Today's visit was #1  Starting weight: 294 lbs Starting date: 07/28/2018 Today's weight: 294 lbs  Today's date: 07/28/2018 Total lbs lost to date: 0   07/28/2018  Height 5\' 4"  (1.626 m)  Weight 294 lb (133.4 kg)  BMI (Calculated) 50.44  BLOOD PRESSURE - SYSTOLIC 606  BLOOD PRESSURE - DIASTOLIC 79  Waist Measurement  55 inches   Body Fat % 51.3 %  Total Body Water (lbs) 98 lbs  RMR 1863   ASK: We discussed the diagnosis of obesity with Robyn [name] today and Jadelyn agreed to give [place] permission to discuss obesity behavioral modification therapy today.  ASSESS:  Gennie has the diagnosis of obesity and her BMI today is 50.5. Zeniya is in the action stage of change.   ADVISE: Suzannah was educated on the multiple health risks of obesity as well as the benefit of weight loss to improve her health. She was advised of the need for long term treatment and the importance of lifestyle modifications to improve her current health and to decrease her risk of future health problems.  AGREE: Multiple dietary modification options and treatment options were discussed and  Paisley agreed to follow the recommendations documented in the above note.  ARRANGE: Etana was educated on the importance of frequent visits to treat obesity as outlined per CMS and USPSTF guidelines and agreed to schedule her next follow up appointment today.  I, Michaelene Song, am acting as transcriptionist for Ilene Qua, MD    I have reviewed the above documentation for accuracy and completeness, and I agree with the above. - Ilene Qua, MD

## 2018-08-02 ENCOUNTER — Other Ambulatory Visit: Payer: Self-pay

## 2018-08-02 ENCOUNTER — Ambulatory Visit (HOSPITAL_COMMUNITY): Payer: 59 | Attending: Cardiology

## 2018-08-02 DIAGNOSIS — R0602 Shortness of breath: Secondary | ICD-10-CM | POA: Diagnosis not present

## 2018-08-07 ENCOUNTER — Other Ambulatory Visit: Payer: Self-pay

## 2018-08-07 ENCOUNTER — Ambulatory Visit
Admission: RE | Admit: 2018-08-07 | Discharge: 2018-08-07 | Disposition: A | Discharge status: Home / Self Care | Payer: 59 | Source: Ambulatory Visit | Attending: Sports Medicine | Admitting: Sports Medicine

## 2018-08-07 DIAGNOSIS — M545 Low back pain, unspecified: Secondary | ICD-10-CM

## 2018-08-11 ENCOUNTER — Ambulatory Visit (INDEPENDENT_AMBULATORY_CARE_PROVIDER_SITE_OTHER): Payer: 59 | Admitting: Family Medicine

## 2018-08-11 ENCOUNTER — Other Ambulatory Visit: Payer: Self-pay

## 2018-08-11 ENCOUNTER — Encounter (INDEPENDENT_AMBULATORY_CARE_PROVIDER_SITE_OTHER): Payer: Self-pay | Admitting: Family Medicine

## 2018-08-11 VITALS — BP 115/75 | HR 67 | Temp 98.0°F | Ht 64.0 in | Wt 284.0 lb

## 2018-08-11 DIAGNOSIS — E7849 Other hyperlipidemia: Secondary | ICD-10-CM | POA: Diagnosis not present

## 2018-08-11 DIAGNOSIS — Z9189 Other specified personal risk factors, not elsewhere classified: Secondary | ICD-10-CM

## 2018-08-11 DIAGNOSIS — E1165 Type 2 diabetes mellitus with hyperglycemia: Secondary | ICD-10-CM | POA: Diagnosis not present

## 2018-08-11 DIAGNOSIS — Z6841 Body Mass Index (BMI) 40.0 and over, adult: Secondary | ICD-10-CM

## 2018-08-11 NOTE — Progress Notes (Signed)
Office: [phone]  /  Fax: [phone]   HPI:   Chief Complaint: OBESITY Robyn [name] is here to discuss her progress with her obesity treatment plan. She is on the Category 3 plan and is following her eating plan approximately 85-90 % of the time. She states she is exercising 0 minutes 0 times per week. Robyn [name] occasionally indulged in extra snacks at night. She is getting in 8-9 oz of meat at night. She id doing mini Kind bars for snacks and Yasso bars.  Her weight is 284 lb (128.8 kg) today and has had a weight loss of 10 pounds over a period of 2 weeks since her last visit. She has lost 10 lbs since starting treatment with [place].  Hyperlipidemia Robyn [name] has hyperlipidemia and has been trying to improve her cholesterol levels with intensive lifestyle modification including a low saturated fat diet, exercise and weight loss. Last LDL was of 155, HDL of 50, and triglycerides of 248. She is not on statin and denies any chest pain, claudication or myalgias.  Vitamin D Deficiency Robyn [name] has a diagnosis of vitamin D deficiency. She is currently taking OTC Vit D. She notes fatigue and denies nausea, vomiting or muscle weakness.  At risk for osteopenia and osteoporosis Robyn [name] is at higher risk of osteopenia and osteoporosis due to vitamin D deficiency.   Diabetes II with Hyperglycemia Robyn [name] has a new diagnosis of diabetes type II. Robyn [name]'s Hgb A1c was of 6.7 and insulin of 56.2. She denies hypoglycemia. She has been working on intensive lifestyle modifications including diet, exercise, and weight loss to help control her blood glucose levels.  ASSESSMENT AND PLAN:  Other hyperlipidemia  Type 2 diabetes mellitus with hyperglycemia, without long-term current use of insulin (HCC)  At risk for osteoporosis  Class 3 severe obesity with serious comorbidity and body mass index (BMI) of 45.0 to 49.9 in adult, unspecified obesity type ([place])  PLAN:  Hyperlipidemia Robyn [name] was  informed of the American Heart Association Guidelines emphasizing intensive lifestyle modifications as the first line treatment for hyperlipidemia. We discussed many lifestyle modifications today in depth, and Robyn [name] will continue to work on decreasing saturated fats such as fatty red meat, butter and many fried foods. She will also increase vegetables and lean protein in her diet and continue to work on exercise and weight loss efforts. We will repeat labs in 3 months. Robyn [name] agrees to follow up with our clinic in 2 weeks.  Vitamin D Deficiency Robyn [name] was informed that low vitamin D levels contributes to fatigue and are associated with obesity, breast, and colon cancer. Robyn [name] agrees to increase OTC Vit D to 5,000 IU daily. She will follow up for routine testing of vitamin D, at least 2-3 times per year. She was informed of the risk of over-replacement of vitamin D and agrees to not increase her dose unless she discusses this with [place] first. Robyn [name] agrees to follow up with our clinic in 2 weeks.  At risk for osteopenia and osteoporosis Robyn [name] was given extended (30 minutes) osteoporosis prevention counseling today. Robyn [name] is at risk for osteopenia and osteoporsis due to her vitamin D deficiency. She was encouraged to take her vitamin D and follow her higher calcium diet and increase strengthening exercise to help strengthen her bones and decrease her risk of osteopenia and osteoporosis.  Diabetes II with Hyperglycemia Robyn [name] has been given extensive diabetes education by myself today including ideal fasting and post-prandial blood glucose readings, individual ideal Hgb A1c goals and hypoglycemia prevention. We discussed  the importance of good blood sugar control to decrease the likelihood of diabetic complications such as nephropathy, neuropathy, limb loss, blindness, coronary artery disease, and death. We discussed the importance of intensive lifestyle modification including diet,  exercise and weight loss as the first line treatment for diabetes. Robyn [name] agrees to start metformin 500 mg PO q AM #30 with no refills. Robyn [name] agrees to follow up with our clinic in 2 weeks.  Obesity Robyn [name] is currently in the action stage of change. As such, her goal is to continue with weight loss efforts She has agreed to follow the Category 3 plan Robyn [name] has been instructed to work up to a goal of 150 minutes of combined cardio and strengthening exercise per week for weight loss and overall health benefits. We discussed the following Behavioral Modification Strategies today: increasing lean protein intake, increasing vegetables and work on meal planning and easy cooking plans, keeping healthy foods in the home, and planning for success   Robyn [name] has agreed to follow up with our clinic in 2 weeks. She was informed of the importance of frequent follow up visits to maximize her success with intensive lifestyle modifications for her multiple health conditions.  ALLERGIES: Allergies  Allergen Reactions   Sulfa Antibiotics Hives and Rash    MEDICATIONS: Current Outpatient Medications on File Prior to Visit  Medication Sig Dispense Refill   acetaminophen (TYLENOL) 500 MG tablet Take 1 tablet (500 mg total) by mouth every 6 (six) hours as needed. 90 tablet 0   allopurinol (ZYLOPRIM) 100 MG tablet Take 1 tablet by mouth daily.     amLODipine (NORVASC) 5 MG tablet Take 5 mg by mouth at bedtime.     carvedilol (COREG) 25 MG tablet Take 1 tablet (25 mg total) by mouth 2 (two) times daily with a meal. 30 tablet 0   chlorthalidone (HYGROTON) 25 MG tablet Take 25 mg by mouth daily.     colchicine 0.6 MG tablet Take 0.6 mg by mouth daily.     diclofenac (VOLTAREN) 75 MG EC tablet Take 1 tablet (75 mg total) by mouth 2 (two) times daily. 50 tablet 2   levothyroxine (SYNTHROID) 25 MCG tablet Take 1 tablet (25 mcg total) by mouth daily. 90 tablet 3   lisinopril  (PRINIVIL,ZESTRIL) 40 MG tablet Take 40 mg by mouth daily.     Multiple Vitamins-Minerals (MULTIVITAMIN WITH MINERALS) tablet Take 1 tablet by mouth daily.     spironolactone (ALDACTONE) 100 MG tablet Take 100 mg by mouth daily.     No current facility-administered medications on file prior to visit.     PAST MEDICAL HISTORY: Past Medical History:  Diagnosis Date   Abnormal Pap smear    Allergy    Anxiety    OCCAS PANIC ATTACKS   Back pain    Bilateral swelling of feet    Blood transfusion 1990'S   Blood transfusion without reported diagnosis    BV (bacterial vaginosis)    Cough    nonproductive cough last 2 weeks   Depression 2010   Fibroid 2011   Gallbladder problem    GERD (gastroesophageal reflux disease)    occasional   H/O dysmenorrhea    H/O varicella    H/O: menorrhagia 2011   Hypertension    Hypokalemia    PAST HX   Hypothyroid    Increased BMI    Kidney problem    Kidney stones    Obesity    OSA (obstructive sleep apnea) 04/22/2013   Ovarian cyst  2011   Perimenopausal symptoms 07/15/2010   Pre-diabetes    Pregnancy induced hypertension    Shortness of breath    ONLY WITH ANXIETY   Sleep apnea    PT USES CPAP SOMETIMES - SETTING IS 3   Vitamin D deficiency    Weight loss 2010    PAST SURGICAL HISTORY: Past Surgical History:  Procedure Laterality Date   C-SECTIONS X 2     CESAREAN SECTION  1993 and [place]   NEPHROLITHOTOMY  04/09/2011   Procedure: NEPHROLITHOTOMY PERCUTANEOUS;  Surgeon: Molli Hazard, MD;  Location: [facility];  Service: Urology;  Laterality: Right;       NEPHROLITHOTOMY  10/15/2011   Procedure: NEPHROLITHOTOMY PERCUTANEOUS;  Surgeon: Molli Hazard, MD;  Location: [facility];  Service: Urology;  Laterality: Left;   PERCUTANEOUS NEPHROSTOMY AROUND 1996  may 2013   right kidney   REMOVAL OF STONES  10/15/2011   Procedure: REMOVAL OF STONES;  Surgeon: Molli Hazard, MD;  Location: [facility];  Service: Urology;  Laterality: Left;   TUBAL LIGATION  1999   URETEROSCOPY  04/09/2011   Procedure: URETEROSCOPY;  Surgeon: Molli Hazard, MD;  Location: [facility];  Service: Urology;  Laterality: Right;   UTERINE ABLATION MARCH 2010  march 2011    SOCIAL HISTORY: Social History   Tobacco Use   Smoking status: Former Smoker    Packs/day: 0.25    Years: 15.00    Pack years: 3.75   Smokeless tobacco: Never Used  Substance Use Topics   Alcohol use: No   Drug use: No    FAMILY HISTORY: Family History  Problem Relation Age of Onset   Diabetes Mother    Hypertension Mother    Obesity Mother    Hypertension Father    Diabetes Father    Kidney disease Father    Stroke Father    Sleep apnea Father    Diabetes Paternal Grandmother    Diabetes Maternal Grandmother    Heart disease Maternal Grandmother     ROS: Review of Systems  Constitutional: Positive for malaise/fatigue and weight loss.  Cardiovascular: Negative for chest pain and claudication.  Gastrointestinal: Negative for nausea and vomiting.  Musculoskeletal: Negative for myalgias.       Negative muscle weakness  Endo/Heme/Allergies:       Negative hypoglycemia    PHYSICAL EXAM: Blood pressure 115/75, pulse 67, temperature 98 F (36.7 C), temperature source Oral, height 5\' 4"  (1.626 m), weight 284 lb (128.8 kg), SpO2 96 %. Body mass index is 48.75 kg/m. Physical Exam Vitals signs reviewed.  Constitutional:      Appearance: Normal appearance. She is obese.  Cardiovascular:     Rate and Rhythm: Normal rate.     Pulses: Normal pulses.  Pulmonary:     Effort: Pulmonary effort is normal.     Breath sounds: Normal breath sounds.  Musculoskeletal: Normal range of motion.  Skin:    General: Skin is warm and dry.  Neurological:     Mental Status: She is alert and oriented to person, place, and time.  Psychiatric:        Mood and Affect: Mood normal.         Behavior: Behavior normal.     RECENT LABS AND TESTS: BMET    Component Value Date/Time   NA 138 07/28/2018 1452   K 4.4 07/28/2018 1452   CL 99 07/28/2018 1452   CO2 21 07/28/2018 1452  GLUCOSE 127 (H) 07/28/2018 1452   GLUCOSE 98 07/25/2012 1350   BUN 30 (H) 07/28/2018 1452   CREATININE 1.53 (H) 07/28/2018 1452   CREATININE 1.14 (H) 05/31/2012 1653   CALCIUM 9.6 07/28/2018 1452   GFRNONAA 40 (L) 07/28/2018 1452   GFRAA 46 (L) 07/28/2018 1452   Lab Results  Component Value Date   HGBA1C 6.7 (H) 07/28/2018   HGBA1C 5.3 09/24/2017   HGBA1C 5.1 04/15/2017   HGBA1C 6.0 (H) 01/14/2012   HGBA1C 5.6 12/28/2011   Lab Results  Component Value Date   INSULIN 56.2 (H) 07/28/2018   CBC    Component Value Date/Time   WBC 8.2 05/17/2018 1557   WBC 7.9 09/24/2017 0937   WBC 7.3 07/25/2012 1350   RBC 3.90 05/17/2018 1557   RBC 4.13 09/24/2017 0937   RBC 4.23 07/25/2012 1350   HGB 12.3 05/17/2018 1557   HCT 34.9 05/17/2018 1557   PLT 286 05/17/2018 1557   MCV 90 05/17/2018 1557   MCH 31.5 05/17/2018 1557   MCH 31.3 (A) 09/24/2017 0937   MCH 29.8 07/25/2012 1350   MCHC 35.2 05/17/2018 1557   MCHC 34.0 09/24/2017 0937   MCHC 33.9 07/25/2012 1350   RDW 14.2 05/17/2018 1557   LYMPHSABS 1.9 06/01/2012 1920   MONOABS 0.6 06/01/2012 1920   EOSABS 0.5 06/01/2012 1920   BASOSABS 0.0 06/01/2012 1920   Iron/TIBC/Ferritin/ %Sat No results found for: IRON, TIBC, FERRITIN, IRONPCTSAT Lipid Panel     Component Value Date/Time   CHOL 255 (H) 07/28/2018 1452   TRIG 248 (H) 07/28/2018 1452   HDL 50 07/28/2018 1452   CHOLHDL 4.8 08/20/2010 0453   VLDL 71 (H) 08/20/2010 0453   LDLCALC 155 (H) 07/28/2018 1452   Hepatic Function Panel     Component Value Date/Time   PROT 7.1 07/28/2018 1452   ALBUMIN 4.3 07/28/2018 1452   AST 12 07/28/2018 1452   ALT 27 07/28/2018 1452   ALKPHOS 82 07/28/2018 1452   BILITOT 0.5 07/28/2018 1452      Component Value Date/Time   TSH 1.450  05/17/2018 1557   TSH 2.400 04/15/2017 0932   TSH 1.535 04/12/2014 1228      OBESITY BEHAVIORAL INTERVENTION VISIT  Today's visit was # 2   Starting weight: 294 lbs Starting date: 07/28/2018 Today's weight : 284 lbs Today's date: 08/11/2018 Total lbs lost to date: 10    ASK: We discussed the diagnosis of obesity with Robyn [name] today and Robyn [name] agreed to give [place] permission to discuss obesity behavioral modification therapy today.  ASSESS: Robyn [name] has the diagnosis of obesity and her BMI today is 48.72 Robyn [name] is in the action stage of change   ADVISE: Robyn [name] was educated on the multiple health risks of obesity as well as the benefit of weight loss to improve her health. She was advised of the need for long term treatment and the importance of lifestyle modifications to improve her current health and to decrease her risk of future health problems.  AGREE: Multiple dietary modification options and treatment options were discussed and  Robyn [name] agreed to follow the recommendations documented in the above note.  ARRANGE: Robyn [name] was educated on the importance of frequent visits to treat obesity as outlined per CMS and USPSTF guidelines and agreed to schedule her next follow up appointment today.  I, Trixie Dredge, am acting as transcriptionist for Ilene Qua, MD  I have reviewed the above documentation for accuracy and completeness, and I agree with the  above. - Ilene Qua, MD

## 2018-08-12 ENCOUNTER — Encounter (INDEPENDENT_AMBULATORY_CARE_PROVIDER_SITE_OTHER): Payer: Self-pay | Admitting: Family Medicine

## 2018-08-12 NOTE — Progress Notes (Addendum)
Office: [phone]  /  Fax: [phone]    Date: August 16, 2018   Appointment Start Time: 4:00pm Duration: 28 minutes Provider: Glennie Isle, Psy.D. Type of Session: Individual Therapy  Location of Patient: Home Location of Provider: [facility] Type of Contact: Telepsychological Visit via [employer] Web[employer]   Session Content: Robyn Robyn [name] is a 48 y.o. female presenting via [place] for a follow-up appointment to address the previously established treatment goal of decreasing emotional eating. Today's appointment was a telepsychological visit, as this provider's clinic is seeing a limited number of patients for in-person visits due to COVID-19. Therapeutic services will resume to in-person appointments once deemed appropriate. Robyn Robyn [name] expressed understanding regarding the rationale for telepsychological services, and provided verbal consent for today's appointment. Prior to proceeding with today's appointment, Robyn Robyn [name]'s physical location at the time of this appointment was obtained. Robyn Robyn [name] reported she was at home and provided the address. In the event of technical difficulties, Robyn Robyn [name] shared a phone number she could be reached at. Robyn Robyn [name] and this provider participated in today's telepsychological service. Also, Robyn Robyn [name] denied anyone else being present in the room or on the WebEx appointment.  This provider conducted a brief check-in and verbally administered the PHQ-9 and GAD-7. Robyn Robyn [name] stated, "I'm still thinking about talking to someone. The main thing for me is making time for it." This was explored further. She acknowledged, "I need to be present in the emotions," but described never processing past events resulting in uncertainty. This was normalized and validated. Robyn Robyn [name] stated she explored EAP service options through her employment and was provided a list of therapists. She was unsure who to choose; therefore, it was recommended she use  psychologytoday.com website to see if the provides have a profile she could read. She agreed to explore her options.  Regarding emotional eating, Robyn Robyn [name] reported an increase in snacking in the evenings due to ongoing worry about work. However, she indicated she lost 10 pounds. For her graduation, she noted a desire to have cake; therefore, it was recommended she plan ahead and use her snack calories. Moreover, psychoeducation regarding triggers for emotional eating was provided. Robyn Robyn [name] was provided a handout, and encouraged to utilize the handout between now and the next appointment to increase awareness of triggers and frequency. Robyn Robyn [name] agreed. This provider also discussed behavioral strategies for specific triggers, such as placing the utensil down when conversing to avoid mindless eating. Robyn Robyn [name] provided verbal consent during today's appointment for this provider to send the handout on triggers via e-mail. Robyn Robyn [name] was receptive to today's session as evidenced by openness to sharing, responsiveness to feedback, and willingness to explore triggers for emotional eating.  Mental Status Examination:  Appearance: neat Behavior: cooperative Mood: euthymic Affect: mood congruent Speech: normal in rate, volume, and tone Eye Contact: appropriate Psychomotor Activity: appropriate Thought Process: linear, logical, and goal directed  Content/Perceptual Disturbances: denies suicidal and homicidal ideation, plan, and intent and no hallucinations, delusions, bizarre thinking or behavior reported or observed Orientation: time, person, place and purpose of appointment Cognition/Sensorium: memory, attention, language, and fund of knowledge intact  Insight: good Judgment: good  Structured Assessment Results: The Patient Health Questionnaire-9 (PHQ-9) is a self-report measure that assesses symptoms and severity of depression over the course of the last two weeks. Robyn Robyn [name] obtained a score of 10  suggesting moderate depression. Robyn Robyn [name] finds the endorsed symptoms to be somewhat difficult. Robyn Robyn [name] 2  Feeling down, depressed, or hopeless 2  Trouble falling or staying asleep, or  sleeping too much 2  Feeling tired or having Robyn energy 1  Poor appetite or overeating 0  Feeling bad about yourself --- or that you are a failure or have let yourself or your family down 0  Trouble concentrating on Robyn [name], such as reading the newspaper or watching television 1  Moving or speaking so slowly that other people could have noticed? Or the opposite --- being so fidgety or restless that you have been moving around a lot more than usual 2  Thoughts that you would be better off dead or hurting yourself in some way 0  PHQ-9 Score 10    The Generalized Anxiety Disorder-7 (GAD-7) is a brief self-report measure that assesses symptoms of anxiety over the course of the last two weeks. Robyn Robyn [name] obtained a score of 6 suggesting mild anxiety. Robyn Robyn [name] finds the endorsed symptoms to be somewhat difficult. Feeling nervous, anxious, on edge 1  Not being able to stop or control worrying 1  Worrying too much about different Robyn [name] 1  Trouble relaxing 1  Being so restless that it's hard to sit still 1  Becoming easily annoyed or irritable 1  Feeling afraid as if something awful might happen 0  GAD-7 Score 6   Interventions:  Conducted a brief chart review Verbal administration of PHQ-9 and GAD-7 for symptom monitoring Provided empathic reflections and validation Reviewed content from the previous session Psychoeducation provided regarding triggers for emotional eating Focused on rapport building Employed supportive psychotherapy interventions to facilitate reduced distress, and to improve coping skills with identified stressors  DSM-5 Diagnosis: 311 (F32.8) Other Specified Depressive Disorder, Emotional Eating Behaviors  Treatment Goal & Progress: During the initial  appointment with this provider, the following treatment goal was established: decrease emotional eating. Progress is limited, as Robyn Robyn [name] has just begun treatment with this provider; however, she is receptive to the interaction and interventions and rapport is being established.   Plan: Kailea continues to appear able and willing to participate as evidenced by engagement in reciprocal conversation, and asking questions for clarification as appropriate. The next appointment will be scheduled in two weeks, which will be via [employer]. Once this provider's office resumes in-person appointments and it is deemed appropriate, Tennie will be notified. The next session will focus on reviewing triggers for emotional eating, and the introduction of thought defusion.

## 2018-08-16 ENCOUNTER — Other Ambulatory Visit: Payer: Self-pay

## 2018-08-16 ENCOUNTER — Ambulatory Visit (INDEPENDENT_AMBULATORY_CARE_PROVIDER_SITE_OTHER): Payer: 59 | Admitting: Psychology

## 2018-08-16 DIAGNOSIS — F3289 Other specified depressive episodes: Secondary | ICD-10-CM

## 2018-08-16 MED ORDER — METFORMIN HCL 500 MG PO TABS: 500.0000 mg | ORAL_TABLET | Freq: Every day | ORAL | 0 refills | Status: DC

## 2018-08-25 ENCOUNTER — Ambulatory Visit (INDEPENDENT_AMBULATORY_CARE_PROVIDER_SITE_OTHER): Payer: 59 | Admitting: Family Medicine

## 2018-08-25 ENCOUNTER — Encounter (INDEPENDENT_AMBULATORY_CARE_PROVIDER_SITE_OTHER): Payer: Self-pay | Admitting: Family Medicine

## 2018-08-25 ENCOUNTER — Other Ambulatory Visit: Payer: Self-pay

## 2018-08-25 VITALS — BP 132/84 | HR 64 | Temp 98.1°F | Ht 64.0 in | Wt 283.0 lb

## 2018-08-25 DIAGNOSIS — Z6841 Body Mass Index (BMI) 40.0 and over, adult: Secondary | ICD-10-CM | POA: Diagnosis not present

## 2018-08-25 DIAGNOSIS — I10 Essential (primary) hypertension: Secondary | ICD-10-CM | POA: Diagnosis not present

## 2018-08-25 DIAGNOSIS — E1165 Type 2 diabetes mellitus with hyperglycemia: Secondary | ICD-10-CM

## 2018-08-25 NOTE — Progress Notes (Addendum)
Office: [phone]  /  Fax: [phone]    Date: August 31, 2018   Appointment Start Time: 2:30pm Duration: 22 minutes Provider: Glennie Isle, Psy.D. Type of Session: Individual Therapy  Location of Patient: Home Location of Provider: [facility] Type of Contact: Telepsychological Visit via [employer] Web[employer]   Session Content: Robyn [name] is a 48 y.o. female presenting via [place] for a follow-up appointment to address the previously established treatment goal of decreasing emotional eating. Today's appointment was a telepsychological visit, as this provider's clinic is seeing a limited number of patients for in-person visits due to COVID-19. Therapeutic services will resume to in-person appointments once deemed appropriate. [name] expressed understanding regarding the rationale for telepsychological services, and provided verbal consent for today's appointment. Prior to proceeding with today's appointment, [name]'s physical location at the time of this appointment was obtained. [name] reported she was at home and provided the address. In the event of technical difficulties, [name] shared a phone number she could be reached at. [name] and this provider participated in today's telepsychological service. Also, Karma denied anyone else being present in the room or on the WebEx appointment.  This provider conducted a brief check-in and verbally administered the PHQ-9 and GAD-7. [name] shared she has been busy with work. She reported she explored options on [email or web address]. In addition, she reported she left a message to schedule an appointment, which will reportedly be covered by work. Moreover, [name] shared, "Yesterday I had a crying spell again. I was very emotional. Work was very stressful." She further noted eating is "going well" and denied episodes of emotional eating. However, [name] reported a decrease in appetite at times and believes it is  because she is busy with work. Thus, it was recommended she plan meals ahead of time as well as have snacks easily accessible. She agreed. To assist with coping, psychoeducation regarding thought defusion, including its impact on emotional eating and overall well-being was provided. [name] was led through a thought defusion exercise, and a handout with various exercises was provided. [name] was encouraged to engage in the thought defusion exercises between now and the next appointment with this provider. [name] agreed. She used the following thought for today's exercise: "I am not good enough." Her experience was processed. [name] stated, "It was different when I worded it different." She described initially feeling "slouchy," but as the exercise progressed, she noted, "it gave me the sense of recognize." [name] provided verbal consent during today's appointment for this provider to send a handout with various thought defusion exercises via e-mail. [name] was receptive to today's session as evidenced by openness to sharing, responsiveness to feedback, and willingness to engage in thought defusion exercises.  Mental Status Examination:  Appearance: neat Behavior: cooperative Mood: sad Affect: mood congruent Speech: normal in rate, volume, and tone Eye Contact: appropriate Psychomotor Activity: appropriate Thought Process: linear, logical, and goal directed  Content/Perceptual Disturbances: no hallucinations, delusions, bizarre thinking or behavior reported or observed and patient denied current suicidal and homicidal ideation, plan, and intent Orientation: time, person, place and purpose of appointment Cognition/Sensorium: memory, attention, language, and fund of knowledge intact  Insight: good Judgment: good  Structured Assessment Results: The Patient Health Questionnaire-9 (PHQ-9) is a self-report measure that assesses symptoms and severity of depression over the course of the  last two weeks. [name] obtained a score of 6 suggesting mild depression. [name] finds the endorsed symptoms to be somewhat difficult. Little interest or pleasure in doing things 1  Feeling down,  depressed, or hopeless 1  Trouble falling or staying asleep, or sleeping too much 1  Feeling tired or having little energy 1  Poor appetite or overeating 0  Feeling bad about yourself --- or that you are a failure or have let yourself or your family down 0  Trouble concentrating on things, such as reading the newspaper or watching television 1  Moving or speaking so slowly that other people could have noticed? Or the opposite --- being so fidgety or restless that you have been moving around a lot more than usual 1  Thoughts that you would be better off dead or hurting yourself in some way 0  PHQ-9 Score 6    The Generalized Anxiety Disorder-7 (GAD-7) is a brief self-report measure that assesses symptoms of anxiety over the course of the last two weeks. [name] obtained a score of 6 suggesting mild anxiety. [name] finds the endorsed symptoms to be somewhat difficult. Feeling nervous, anxious, on edge 1  Not being able to stop or control worrying 1  Worrying too much about different things 1  Trouble relaxing 1  Being so restless that it's hard to sit still 1  Becoming easily annoyed or irritable 1  Feeling afraid as if something awful might happen 0  GAD-7 Score 6   Interventions:  Conducted a brief chart review Verbal administration of PHQ-9 and GAD-7 for symptom monitoring Provided empathic reflections and validation Reviewed content from the previous session Psychoeducation provided regarding thought defusion Engaged patient in a thought defusion exercise Employed supportive psychotherapy interventions to facilitate reduced distress, and to improve coping skills with identified stressors Employed acceptance and commitment interventions to emphasize mindfulness and acceptance without  struggle  DSM-5 Diagnosis: 311 (F32.8) Other Specified Depressive Disorder, Emotional Eating Behaviors  Treatment Goal & Progress: During the initial appointment with this provider, the following treatment goal was established: decrease emotional eating. Zoha has demonstrated progress in her goal as evidenced by increased awareness of hunger patterns and triggers for emotional eating.   Plan: Makinsley continues to appear able and willing to participate as evidenced by engagement in reciprocal conversation, and asking questions for clarification as appropriate. The next appointment will be scheduled in three weeks, which will be via [employer]. The next session will focus further on thought defusion.

## 2018-08-26 NOTE — Progress Notes (Signed)
Office: [phone]  /  Fax: [phone]   HPI:   Chief Complaint: OBESITY Robyn [name] is here to discuss her progress with her obesity treatment plan. She is on the  follow the Category 3 plan and is following her eating plan approximately 95 % of the time. She states she is exercising 0 minutes 0 times per week. Robyn [name] had a celebration this past weekend as she graduated with her [employer]. The next few weeks she plans to add a bit more physical activity. She is getting all food in. She denies hunger the past few weeks. She is eating yasso, skinny pop, and mini kind bars for snacks.  Her weight is 283 lb (128.4 kg) today and has had a weight loss of 1 pounds over a period of 2 weeks since her last visit. She has lost 11 lbs since starting treatment with [place].  Diabetes II Robyn [name] has a diagnosis of diabetes type II. Robyn [name]  denies any hypoglycemic episodes or GI side effects. Last A1c was 6.7.  She has been working on intensive lifestyle modifications including diet, exercise, and weight loss to help control her blood glucose levels.  Hypertension Robyn [name] is a 48 y.o. female with hypertension.  Robyn [name] denies chest pain/pressure, headaches, or shortness of breath on exertion. She is working weight loss to help control her blood pressure with the goal of decreasing her risk of heart attack and stroke. [name] blood pressure is currently controlled.    ASSESSMENT AND PLAN:  Type 2 diabetes mellitus with hyperglycemia, without long-term current use of insulin (HCC)  Essential hypertension  Class 3 severe obesity with serious comorbidity and body mass index (BMI) of 45.0 to 49.9 in adult, unspecified obesity type ([place])  PLAN: Diabetes II Robyn [name] has been given extensive diabetes education by myself today including ideal fasting and post-prandial blood glucose readings, individual ideal HgA1c goals  and hypoglycemia prevention. We discussed the importance  of good blood sugar control to decrease the likelihood of diabetic complications such as nephropathy, neuropathy, limb loss, blindness, coronary artery disease, and death. We discussed the importance of intensive lifestyle modification including diet, exercise and weight loss as the first line treatment for diabetes. Robyn [name] agrees to continue her diabetes medications and will follow up at the agreed upon time.  Hypertension We discussed sodium restriction, working on healthy weight loss, and a regular exercise program as the means to achieve improved blood pressure control. Robyn [name] agreed with this plan and agreed to follow up as directed. We will continue to monitor her blood pressure as well as her progress with the above lifestyle modifications. She will continue her medications as prescribed and will watch for signs of hypotension as she continues her lifestyle modifications.  I spent > than 50% of the 15 minute visit on counseling as documented in the note.  Obesity Robyn [name] is currently in the action stage of change. As such, her goal is to continue with weight loss efforts She has agreed to follow the Category 3 plan Robyn [name] has been instructed to work up to a goal of 150 minutes of combined cardio and strengthening exercise per week for weight loss and overall health benefits. We discussed the following Behavioral Modification Strategies today:keeping healthy foods in the home, planning for success,  increasing lean protein intake, increasing vegetables and work on meal planning and easy cooking plans   Robyn [name] has agreed to follow up with our clinic in 2 weeks. She was informed of the importance of frequent  follow up visits to maximize her success with intensive lifestyle modifications for her multiple health conditions.  ALLERGIES: Allergies  Allergen Reactions  . Sulfa Antibiotics Hives and Rash    MEDICATIONS: Current Outpatient Medications on File Prior to Visit   Medication Sig Dispense Refill  . acetaminophen (TYLENOL) 500 MG tablet Take 1 tablet (500 mg total) by mouth every 6 (six) hours as needed. 90 tablet 0  . allopurinol (ZYLOPRIM) 100 MG tablet Take 1 tablet by mouth daily.    Robyn [name] amLODipine (NORVASC) 5 MG tablet Take 5 mg by mouth at bedtime.    . carvedilol (COREG) 25 MG tablet Take 1 tablet (25 mg total) by mouth 2 (two) times daily with a meal. 30 tablet 0  . chlorthalidone (HYGROTON) 25 MG tablet Take 25 mg by mouth daily.    . colchicine 0.6 MG tablet Take 0.6 mg by mouth daily.    . diclofenac (VOLTAREN) 75 MG EC tablet Take 1 tablet (75 mg total) by mouth 2 (two) times daily. 50 tablet 2  . levothyroxine (SYNTHROID) 25 MCG tablet Take 1 tablet (25 mcg total) by mouth daily. 90 tablet 3  . lisinopril (PRINIVIL,ZESTRIL) 40 MG tablet Take 40 mg by mouth daily.    . metFORMIN (GLUCOPHAGE) 500 MG tablet Take 1 tablet (500 mg total) by mouth daily with breakfast. 30 tablet 0  . Multiple Vitamins-Minerals (MULTIVITAMIN WITH MINERALS) tablet Take 1 tablet by mouth daily.    Robyn [name] spironolactone (ALDACTONE) 100 MG tablet Take 100 mg by mouth daily.     No current facility-administered medications on file prior to visit.     PAST MEDICAL HISTORY: Past Medical History:  Diagnosis Date  . Abnormal Pap smear   . Allergy   . Anxiety    OCCAS PANIC ATTACKS  . Back pain   . Bilateral swelling of feet   . Blood transfusion 1990'S  . Blood transfusion without reported diagnosis   . BV (bacterial vaginosis)   . Cough    nonproductive cough last 2 weeks  . Depression 2010  . Fibroid 2011  . Gallbladder problem   . GERD (gastroesophageal reflux disease)    occasional  . H/O dysmenorrhea   . H/O varicella   . H/O: menorrhagia 2011  . Hypertension   . Hypokalemia    PAST HX  . Hypothyroid   . Increased BMI   . Kidney problem   . Kidney stones   . Obesity   . OSA (obstructive sleep apnea) 04/22/2013  . Ovarian cyst 2011  . Perimenopausal  symptoms 07/15/2010  . Pre-diabetes   . Pregnancy induced hypertension   . Shortness of breath    ONLY WITH ANXIETY  . Sleep apnea    PT USES CPAP SOMETIMES - SETTING IS 3  . Vitamin D deficiency   . Weight loss 2010    PAST SURGICAL HISTORY: Past Surgical History:  Procedure Laterality Date  . C-SECTIONS X 2    . [place]  . CHOLECYSTECTOMY  1994  . NEPHROLITHOTOMY  04/09/2011   Procedure: NEPHROLITHOTOMY PERCUTANEOUS;  Surgeon: Molli Hazard, MD;  Location: [facility];  Service: Urology;  Laterality: Right;      . NEPHROLITHOTOMY  10/15/2011   Procedure: NEPHROLITHOTOMY PERCUTANEOUS;  Surgeon: Molli Hazard, MD;  Location: [facility];  Service: Urology;  Laterality: Left;  . PERCUTANEOUS NEPHROSTOMY AROUND 1996  may 2013   right kidney  . REMOVAL OF STONES  10/15/2011   Procedure:  REMOVAL OF STONES;  Surgeon: Molli Hazard, MD;  Location: [facility];  Service: Urology;  Laterality: Left;  . TUBAL LIGATION  1999  . URETEROSCOPY  04/09/2011   Procedure: URETEROSCOPY;  Surgeon: Molli Hazard, MD;  Location: [facility];  Service: Urology;  Laterality: Right;  . UTERINE ABLATION MARCH 2010  march 2011    SOCIAL HISTORY: Social History   Tobacco Use  . Smoking status: Former Smoker    Packs/day: 0.25    Years: 15.00    Pack years: 3.75  . Smokeless tobacco: Never Used  Substance Use Topics  . Alcohol use: No  . Drug use: No    FAMILY HISTORY: Family History  Problem Relation Age of Onset  . Diabetes Mother   . Hypertension Mother   . Obesity Mother   . Hypertension Father   . Diabetes Father   . Kidney disease Father   . Stroke Father   . Sleep apnea Father   . Diabetes Paternal Grandmother   . Diabetes Maternal Grandmother   . Heart disease Maternal Grandmother     ROS: Review of Systems  Constitutional: Positive for weight loss.  Respiratory: Negative for shortness of breath.   Cardiovascular: Negative for chest pain.   Gastrointestinal: Negative for diarrhea, nausea and vomiting.  Neurological: Negative for headaches.  Endo/Heme/Allergies:       Negative for hypoglycemia     PHYSICAL EXAM: Blood pressure 132/84, pulse 64, temperature 98.1 F (36.7 C), temperature source Oral, height 5\' 4"  (1.626 m), weight 283 lb (128.4 kg), SpO2 97 %. Body mass index is 48.58 kg/m. Physical Exam Vitals signs reviewed.  Constitutional:      Appearance: Normal appearance. She is obese.  HENT:     Head: Normocephalic.     Nose: Nose normal.  Neck:     Musculoskeletal: Normal range of motion.  Cardiovascular:     Rate and Rhythm: Normal rate.  Pulmonary:     Effort: Pulmonary effort is normal.  Musculoskeletal: Normal range of motion.  Skin:    General: Skin is warm and dry.  Neurological:     Mental Status: She is alert and oriented to person, place, and time.  Psychiatric:        Mood and Affect: Mood normal.        Behavior: Behavior normal.     RECENT LABS AND TESTS: BMET    Component Value Date/Time   NA 138 07/28/2018 1452   K 4.4 07/28/2018 1452   CL 99 07/28/2018 1452   CO2 21 07/28/2018 1452   GLUCOSE 127 (H) 07/28/2018 1452   GLUCOSE 98 07/25/2012 1350   BUN 30 (H) 07/28/2018 1452   CREATININE 1.53 (H) 07/28/2018 1452   CREATININE 1.14 (H) 05/31/2012 1653   CALCIUM 9.6 07/28/2018 1452   GFRNONAA 40 (L) 07/28/2018 1452   GFRAA 46 (L) 07/28/2018 1452   Lab Results  Component Value Date   HGBA1C 6.7 (H) 07/28/2018   HGBA1C 5.3 09/24/2017   HGBA1C 5.1 04/15/2017   HGBA1C 6.0 (H) 01/14/2012   HGBA1C 5.6 12/28/2011   Lab Results  Component Value Date   INSULIN 56.2 (H) 07/28/2018   CBC    Component Value Date/Time   WBC 8.2 05/17/2018 1557   WBC 7.9 09/24/2017 0937   WBC 7.3 07/25/2012 1350   RBC 3.90 05/17/2018 1557   RBC 4.13 09/24/2017 0937   RBC 4.23 07/25/2012 1350   HGB 12.3 05/17/2018 1557   HCT 34.9 05/17/2018 1557  PLT 286 05/17/2018 1557   MCV 90 05/17/2018  1557   MCH 31.5 05/17/2018 1557   MCH 31.3 (A) 09/24/2017 0937   MCH 29.8 07/25/2012 1350   MCHC 35.2 05/17/2018 1557   MCHC 34.0 09/24/2017 0937   MCHC 33.9 07/25/2012 1350   RDW 14.2 05/17/2018 1557   LYMPHSABS 1.9 06/01/2012 1920   MONOABS 0.6 06/01/2012 1920   EOSABS 0.5 06/01/2012 1920   BASOSABS 0.0 06/01/2012 1920   Iron/TIBC/Ferritin/ %Sat No results found for: IRON, TIBC, FERRITIN, IRONPCTSAT Lipid Panel     Component Value Date/Time   CHOL 255 (H) 07/28/2018 1452   TRIG 248 (H) 07/28/2018 1452   HDL 50 07/28/2018 1452   CHOLHDL 4.8 08/20/2010 0453   VLDL 71 (H) 08/20/2010 0453   LDLCALC 155 (H) 07/28/2018 1452   Hepatic Function Panel     Component Value Date/Time   PROT 7.1 07/28/2018 1452   ALBUMIN 4.3 07/28/2018 1452   AST 12 07/28/2018 1452   ALT 27 07/28/2018 1452   ALKPHOS 82 07/28/2018 1452   BILITOT 0.5 07/28/2018 1452      Component Value Date/Time   TSH 1.450 05/17/2018 1557   TSH 2.400 04/15/2017 0932   TSH 1.535 04/12/2014 1228      OBESITY BEHAVIORAL INTERVENTION VISIT  Today's visit was # 3   Starting weight: 294 lbs Starting date: 07/28/18 Today's weight : Weight: 283 lb (128.4 kg)  Today's date: 08/25/18 Total lbs lost to date: 11 lbs At least 15 minutes were spent on discussing the following behavioral intervention visit.   ASK: We discussed the diagnosis of obesity with [name] today and Dari agreed to give [place] permission to discuss obesity behavioral modification therapy today.  ASSESS: Daniel has the diagnosis of obesity and her BMI today is 48.55 Delane is in the action stage of change   ADVISE: Carlia was educated on the multiple health risks of obesity as well as the benefit of weight loss to improve her health. She was advised of the need for long term treatment and the importance of lifestyle modifications to improve her current health and to decrease her risk of future health problems.  AGREE:  Multiple dietary modification options and treatment options were discussed and  Ajani agreed to follow the recommendations documented in the above note.  ARRANGE: Dreana was educated on the importance of frequent visits to treat obesity as outlined per CMS and USPSTF guidelines and agreed to schedule her next follow up appointment today.  I, Renee Ramus, am acting as transcriptionist for Ilene Qua, MD   I have reviewed the above documentation for accuracy and completeness, and I agree with the above. - Ilene Qua, MD

## 2018-08-31 ENCOUNTER — Ambulatory Visit (INDEPENDENT_AMBULATORY_CARE_PROVIDER_SITE_OTHER): Payer: 59 | Admitting: Psychology

## 2018-08-31 ENCOUNTER — Other Ambulatory Visit: Payer: Self-pay

## 2018-08-31 DIAGNOSIS — F3289 Other specified depressive episodes: Secondary | ICD-10-CM

## 2018-09-08 ENCOUNTER — Other Ambulatory Visit: Payer: Self-pay

## 2018-09-08 ENCOUNTER — Ambulatory Visit (INDEPENDENT_AMBULATORY_CARE_PROVIDER_SITE_OTHER): Payer: 59 | Admitting: Family Medicine

## 2018-09-08 ENCOUNTER — Encounter (INDEPENDENT_AMBULATORY_CARE_PROVIDER_SITE_OTHER): Payer: Self-pay | Admitting: Family Medicine

## 2018-09-08 VITALS — BP 106/73 | HR 82 | Temp 98.3°F | Ht 64.0 in | Wt 283.0 lb

## 2018-09-08 DIAGNOSIS — Z6841 Body Mass Index (BMI) 40.0 and over, adult: Secondary | ICD-10-CM

## 2018-09-08 DIAGNOSIS — I10 Essential (primary) hypertension: Secondary | ICD-10-CM | POA: Diagnosis not present

## 2018-09-08 DIAGNOSIS — E66813 Obesity, class 3: Secondary | ICD-10-CM

## 2018-09-08 DIAGNOSIS — Z9189 Other specified personal risk factors, not elsewhere classified: Secondary | ICD-10-CM

## 2018-09-08 DIAGNOSIS — E1165 Type 2 diabetes mellitus with hyperglycemia: Secondary | ICD-10-CM | POA: Diagnosis not present

## 2018-09-08 MED ORDER — RYBELSUS 3 MG PO TABS: 3.0000 mg | ORAL_TABLET | Freq: Every day | ORAL | 0 refills | Status: DC

## 2018-09-08 NOTE — Progress Notes (Signed)
Office: [phone]  /  Fax: [phone]   HPI:   Chief Complaint: OBESITY Robyn [name] is here to discuss her progress with her obesity treatment plan. She is on the Category 3 plan and is following her eating plan approximately 95 % of the time. She states she was doing yoga for 45 minutes 1 time for 2 weeks. Robyn [name] feels she is at a standstill with weight loss. She is not always getting all of the food in from the plan. She is missing food from each meal. She voices that she has found it difficult to find some products.  Her weight is 283 lb (128.4 kg) today and has not lost weight since her last visit. She has lost 11 lbs since starting treatment with [place].  Diabetes II with Hyperglycemia Robyn [name] has a diagnosis of diabetes type II. Robyn [name] states she just got a letter concerning metformin recall for a lot number that she received. She denies hypoglycemia. Last Hgb A1c was 6.7. She has been working on intensive lifestyle modifications including diet, exercise, and weight loss to help control her blood glucose levels.  Hypertension Robyn [name] is a 48 y.o. female with hypertension. Robyn [name]'s blood pressure is well controlled. She denies chest pain, chest pressure, or headaches, but notes occasional dizziness. She is working on weight loss to help control her blood pressure with the goal of decreasing her risk of heart attack and stroke.   At risk for cardiovascular disease Robyn [name] is at a higher than average risk for cardiovascular disease due to obesity, diabetes II, and hypertension. She currently denies any chest pain.  ASSESSMENT AND PLAN:  Type 2 diabetes mellitus with hyperglycemia, without long-term current use of insulin (HCC) - Plan: Semaglutide (RYBELSUS) 3 MG TABS  Essential hypertension  At risk for heart disease  Class 3 severe obesity with serious comorbidity and body mass index (BMI) of 45.0 to 49.9 in adult, unspecified obesity type ([place])  PLAN:   Diabetes II with Hyperglycemia Robyn [name] has been given extensive diabetes education by myself today including ideal fasting and post-prandial blood glucose readings, individual ideal Hgb A1c goals and hypoglycemia prevention. We discussed the importance of good blood sugar control to decrease the likelihood of diabetic complications such as nephropathy, neuropathy, limb loss, blindness, coronary artery disease, and death. We discussed the importance of intensive lifestyle modification including diet, exercise and weight loss as the first line treatment for diabetes. Kallan agrees to stop metformin (restart when she gets more information from the pharmacy). She agrees to start Rybelsus 3 mg PO q AM #30 with no refills. Meher agrees to follow up with our clinic in 2 weeks.  Hypertension We discussed sodium restriction, working on healthy weight loss, and a regular exercise program as the means to achieve improved blood pressure control. Jandi agreed with this plan and agreed to follow up as directed. We will continue to monitor her blood pressure as well as her progress with the above lifestyle modifications. Imaan agrees to continue her medications and will watch for signs of hypotension as she continues her lifestyle modifications. We will follow up on her blood pressure at her next appointment, if blood pressure is controlled we will decrease lisinopril at her next appointment. Shaquayla agrees to follow up with our clinic in 2 weeks.  Cardiovascular risk counseling Robyn [name] was given extended (15 minutes) coronary artery disease prevention counseling today. She is 48 y.o. female and has risk factors for heart disease including obesity, diabetes II, and hypertension. We discussed  intensive lifestyle modifications today with an emphasis on specific weight loss instructions and strategies. Pt was also informed of the importance of increasing exercise and decreasing saturated fats to help  prevent heart disease.  Obesity Robyn [name] is currently in the action stage of change. As such, her goal is to continue with weight loss efforts She has agreed to follow the Category 3 plan Robyn [name] has been instructed to work up to a goal of 150 minutes of combined cardio and strengthening exercise per week for weight loss and overall health benefits. We discussed the following Behavioral Modification Strategies today: increasing lean protein intake, increasing vegetables and work on meal planning and easy cooking plans, better snacking choices, and planning for success   Robyn [name] has agreed to follow up with our clinic in 2 weeks. She was informed of the importance of frequent follow up visits to maximize her success with intensive lifestyle modifications for her multiple health conditions.  ALLERGIES: Allergies  Allergen Reactions  . Sulfa Antibiotics Hives and Rash    MEDICATIONS: Current Outpatient Medications on File Prior to Visit  Medication Sig Dispense Refill  . acetaminophen (TYLENOL) 500 MG tablet Take 1 tablet (500 mg total) by mouth every 6 (six) hours as needed. 90 tablet 0  . allopurinol (ZYLOPRIM) 100 MG tablet Take 1 tablet by mouth daily.    Marland Kitchen amLODipine (NORVASC) 5 MG tablet Take 5 mg by mouth at bedtime.    . carvedilol (COREG) 25 MG tablet Take 1 tablet (25 mg total) by mouth 2 (two) times daily with a meal. 30 tablet 0  . chlorthalidone (HYGROTON) 25 MG tablet Take 25 mg by mouth daily.    . colchicine 0.6 MG tablet Take 0.6 mg by mouth daily.    . diclofenac (VOLTAREN) 75 MG EC tablet Take 1 tablet (75 mg total) by mouth 2 (two) times daily. 50 tablet 2  . levothyroxine (SYNTHROID) 25 MCG tablet Take 1 tablet (25 mcg total) by mouth daily. 90 tablet 3  . lisinopril (PRINIVIL,ZESTRIL) 40 MG tablet Take 40 mg by mouth daily.    . Multiple Vitamins-Minerals (MULTIVITAMIN WITH MINERALS) tablet Take 1 tablet by mouth daily.    Marland Kitchen spironolactone (ALDACTONE) 100 MG  tablet Take 100 mg by mouth daily.     No current facility-administered medications on file prior to visit.     PAST MEDICAL HISTORY: Past Medical History:  Diagnosis Date  . Abnormal Pap smear   . Allergy   . Anxiety    OCCAS PANIC ATTACKS  . Back pain   . Bilateral swelling of feet   . Blood transfusion 1990'S  . Blood transfusion without reported diagnosis   . BV (bacterial vaginosis)   . Cough    nonproductive cough last 2 weeks  . Depression 2010  . Fibroid 2011  . Gallbladder problem   . GERD (gastroesophageal reflux disease)    occasional  . H/O dysmenorrhea   . H/O varicella   . H/O: menorrhagia 2011  . Hypertension   . Hypokalemia    PAST HX  . Hypothyroid   . Increased BMI   . Kidney problem   . Kidney stones   . Obesity   . OSA (obstructive sleep apnea) 04/22/2013  . Ovarian cyst 2011  . Perimenopausal symptoms 07/15/2010  . Pre-diabetes   . Pregnancy induced hypertension   . Shortness of breath    ONLY WITH ANXIETY  . Sleep apnea    PT USES CPAP SOMETIMES - SETTING IS 3  .  Vitamin D deficiency   . Weight loss 2010    PAST SURGICAL HISTORY: Past Surgical History:  Procedure Laterality Date  . C-SECTIONS X 2    . [place]  . CHOLECYSTECTOMY  1994  . NEPHROLITHOTOMY  04/09/2011   Procedure: NEPHROLITHOTOMY PERCUTANEOUS;  Surgeon: Molli Hazard, MD;  Location: [facility];  Service: Urology;  Laterality: Right;      . NEPHROLITHOTOMY  10/15/2011   Procedure: NEPHROLITHOTOMY PERCUTANEOUS;  Surgeon: Molli Hazard, MD;  Location: [facility];  Service: Urology;  Laterality: Left;  . PERCUTANEOUS NEPHROSTOMY AROUND 1996  may 2013   right kidney  . REMOVAL OF STONES  10/15/2011   Procedure: REMOVAL OF STONES;  Surgeon: Molli Hazard, MD;  Location: [facility];  Service: Urology;  Laterality: Left;  . TUBAL LIGATION  1999  . URETEROSCOPY  04/09/2011   Procedure: URETEROSCOPY;  Surgeon: Molli Hazard, MD;  Location: [facility];  Service: Urology;  Laterality: Right;  . UTERINE ABLATION MARCH 2010  march 2011    SOCIAL HISTORY: Social History   Tobacco Use  . Smoking status: Former Smoker    Packs/day: 0.25    Years: 15.00    Pack years: 3.75  . Smokeless tobacco: Never Used  Substance Use Topics  . Alcohol use: No  . Drug use: No    FAMILY HISTORY: Family History  Problem Relation Age of Onset  . Diabetes Mother   . Hypertension Mother   . Obesity Mother   . Hypertension Father   . Diabetes Father   . Kidney disease Father   . Stroke Father   . Sleep apnea Father   . Diabetes Paternal Grandmother   . Diabetes Maternal Grandmother   . Heart disease Maternal Grandmother     ROS: Review of Systems  Constitutional: Negative for weight loss.  Cardiovascular: Negative for chest pain.       Negative chest pressure  Neurological: Positive for dizziness. Negative for headaches.  Endo/Heme/Allergies:       Negative hypoglycemia    PHYSICAL EXAM: Blood pressure 106/73, pulse 82, temperature 98.3 F (36.8 C), temperature source Oral, height 5\' 4"  (1.626 m), weight 283 lb (128.4 kg), SpO2 96 %. Body mass index is 48.58 kg/m. Physical Exam Vitals signs reviewed.  Constitutional:      Appearance: Normal appearance. She is obese.  Cardiovascular:     Rate and Rhythm: Normal rate.     Pulses: Normal pulses.  Pulmonary:     Effort: Pulmonary effort is normal.     Breath sounds: Normal breath sounds.  Musculoskeletal: Normal range of motion.  Skin:    General: Skin is warm and dry.  Neurological:     Mental Status: She is alert and oriented to person, place, and time.  Psychiatric:        Mood and Affect: Mood normal.        Behavior: Behavior normal.     RECENT LABS AND TESTS: BMET    Component Value Date/Time   NA 138 07/28/2018 1452   K 4.4 07/28/2018 1452   CL 99 07/28/2018 1452   CO2 21 07/28/2018 1452   GLUCOSE 127 (H) 07/28/2018 1452   GLUCOSE 98 07/25/2012 1350   BUN  30 (H) 07/28/2018 1452   CREATININE 1.53 (H) 07/28/2018 1452   CREATININE 1.14 (H) 05/31/2012 1653   CALCIUM 9.6 07/28/2018 1452   GFRNONAA 40 (L) 07/28/2018 1452   GFRAA 46 (L) 07/28/2018 1452  Lab Results  Component Value Date   HGBA1C 6.7 (H) 07/28/2018   HGBA1C 5.3 09/24/2017   HGBA1C 5.1 04/15/2017   HGBA1C 6.0 (H) 01/14/2012   HGBA1C 5.6 12/28/2011   Lab Results  Component Value Date   INSULIN 56.2 (H) 07/28/2018   CBC    Component Value Date/Time   WBC 8.2 05/17/2018 1557   WBC 7.9 09/24/2017 0937   WBC 7.3 07/25/2012 1350   RBC 3.90 05/17/2018 1557   RBC 4.13 09/24/2017 0937   RBC 4.23 07/25/2012 1350   HGB 12.3 05/17/2018 1557   HCT 34.9 05/17/2018 1557   PLT 286 05/17/2018 1557   MCV 90 05/17/2018 1557   MCH 31.5 05/17/2018 1557   MCH 31.3 (A) 09/24/2017 0937   MCH 29.8 07/25/2012 1350   MCHC 35.2 05/17/2018 1557   MCHC 34.0 09/24/2017 0937   MCHC 33.9 07/25/2012 1350   RDW 14.2 05/17/2018 1557   LYMPHSABS 1.9 06/01/2012 1920   MONOABS 0.6 06/01/2012 1920   EOSABS 0.5 06/01/2012 1920   BASOSABS 0.0 06/01/2012 1920   Iron/TIBC/Ferritin/ %Sat No results found for: IRON, TIBC, FERRITIN, IRONPCTSAT Lipid Panel     Component Value Date/Time   CHOL 255 (H) 07/28/2018 1452   TRIG 248 (H) 07/28/2018 1452   HDL 50 07/28/2018 1452   CHOLHDL 4.8 08/20/2010 0453   VLDL 71 (H) 08/20/2010 0453   LDLCALC 155 (H) 07/28/2018 1452   Hepatic Function Panel     Component Value Date/Time   PROT 7.1 07/28/2018 1452   ALBUMIN 4.3 07/28/2018 1452   AST 12 07/28/2018 1452   ALT 27 07/28/2018 1452   ALKPHOS 82 07/28/2018 1452   BILITOT 0.5 07/28/2018 1452      Component Value Date/Time   TSH 1.450 05/17/2018 1557   TSH 2.400 04/15/2017 0932   TSH 1.535 04/12/2014 1228      OBESITY BEHAVIORAL INTERVENTION VISIT  Today's visit was # 4   Starting weight: 294 lbs Starting date: 07/28/2018 Today's weight : 283 lbs Today's date: 09/08/2018 Total lbs lost to  date: 11    ASK: We discussed the diagnosis of obesity with [name] today and Kadelyn agreed to give [place] permission to discuss obesity behavioral modification therapy today.  ASSESS: Lavonn has the diagnosis of obesity and her BMI today is 48.55 Torrin is in the action stage of change   ADVISE: Oliviana was educated on the multiple health risks of obesity as well as the benefit of weight loss to improve her health. She was advised of the need for long term treatment and the importance of lifestyle modifications to improve her current health and to decrease her risk of future health problems.  AGREE: Multiple dietary modification options and treatment options were discussed and  Sianna agreed to follow the recommendations documented in the above note.  ARRANGE: Lakedra was educated on the importance of frequent visits to treat obesity as outlined per CMS and USPSTF guidelines and agreed to schedule her next follow up appointment today.  I, Trixie Dredge, am acting as transcriptionist for Ilene Qua, MD  I have reviewed the above documentation for accuracy and completeness, and I agree with the above. - Ilene Qua, MD

## 2018-09-10 ENCOUNTER — Other Ambulatory Visit: Payer: Self-pay | Admitting: Gastroenterology

## 2018-09-22 ENCOUNTER — Other Ambulatory Visit: Payer: Self-pay

## 2018-09-22 ENCOUNTER — Ambulatory Visit (INDEPENDENT_AMBULATORY_CARE_PROVIDER_SITE_OTHER): Payer: 59 | Admitting: Family Medicine

## 2018-09-22 ENCOUNTER — Ambulatory Visit (INDEPENDENT_AMBULATORY_CARE_PROVIDER_SITE_OTHER): Payer: Self-pay | Admitting: Psychology

## 2018-09-22 VITALS — HR 83 | Temp 98.8°F | Ht 64.0 in | Wt 280.6 lb

## 2018-09-22 DIAGNOSIS — E1165 Type 2 diabetes mellitus with hyperglycemia: Secondary | ICD-10-CM | POA: Diagnosis not present

## 2018-09-22 DIAGNOSIS — Z6841 Body Mass Index (BMI) 40.0 and over, adult: Secondary | ICD-10-CM

## 2018-09-22 DIAGNOSIS — E7849 Other hyperlipidemia: Secondary | ICD-10-CM

## 2018-09-23 NOTE — Progress Notes (Signed)
Office: [phone]  /  Fax: [phone]   HPI:   Chief Complaint: OBESITY Robyn [name] is here to discuss her progress with her obesity treatment plan. She is on the Category 3 plan and is following her eating plan approximately 80 % of the time. She states she is doing Zumba for 15 minutes 1 times per week. Robyn [name] started Rybelsus and voices it has been hard to get all of the food in. For breakfast, she is doing 2 eggs, cheese, and English muffin, but if she is doing yogurt, she does not finish it. She is doing a smoothie and Smart One for lunch. For dinner, she is doing chicken in the air fryer or ground [place] taco salad, getting in 7-8 oz of meat. She is still getting snacks in.  Her weight is 280 lb 9.6 oz (127.3 kg) today and has had a weight loss of 3 pounds over a period of 2 weeks since her last visit. She has lost 14 lbs since starting treatment with [place].  Diabetes II Robyn [name] has a diagnosis of diabetes type II. Sophiarose started taking Rybelsus and denies GI side effects. Last A1c was 6.7. She denies hypoglycemia. She has been working on intensive lifestyle modifications including diet, exercise, and weight loss to help control her blood glucose levels.  Hyperlipidemia Robyn [name] has hyperlipidemia and has been trying to improve her cholesterol levels with intensive lifestyle modification including a low saturated fat diet, exercise and weight loss. Last LDL was of 155 and HDL of 50. She is not on statin and denies any chest pain, claudication or myalgias.  ASSESSMENT AND PLAN:  Type 2 diabetes mellitus with hyperglycemia, without long-term current use of insulin (HCC)  Other hyperlipidemia  Robyn [name], Robyn obesity type (Robyn [name])  PLAN:  Diabetes II Robyn [name] has been given extensive diabetes education by myself today including ideal fasting and post-prandial blood glucose  readings, individual ideal Hgb A1c goals and hypoglycemia prevention. We discussed the importance of good blood sugar control to decrease the likelihood of diabetic complications such as nephropathy, neuropathy, limb loss, blindness, coronary artery disease, and death. We discussed the importance of intensive lifestyle modification including diet, exercise and weight loss as the first line treatment for diabetes. Robyn [name] agrees to continue taking Rybelsus, and she agrees to follow up with our clinic in 2 weeks.  Hyperlipidemia Robyn [name] was informed of the American Heart Association Guidelines emphasizing intensive lifestyle modifications as the first line treatment for hyperlipidemia. We discussed many lifestyle modifications today in depth, and Robyn [name] will continue to work on decreasing saturated fats such as fatty red meat, butter and many fried foods. She will also increase vegetables and lean protein in her diet and continue to work on exercise and weight loss efforts. We will repeat FLP in 3 months.  I spent > than 50% of the 15 minute visit on counseling as documented in the note.  Obesity Robyn [name] is currently in the action stage of change. As such, her goal is to continue with weight loss efforts She has agreed to follow the Category 3 plan Robyn [name] has been instructed to work up to a goal of 150 minutes of combined cardio and strengthening exercise per week for weight loss and overall health benefits. We discussed the following Behavioral Modification Strategies today: increasing lean protein intake, increasing vegetables, work on meal planning and easy cooking plans, and keeping healthy foods in the home  Robyn [name] has agreed to follow up with our clinic in 2 weeks. She was informed of the importance of frequent follow up visits to maximize her success with intensive lifestyle modifications for her multiple health conditions.  ALLERGIES: Allergies  Allergen Reactions  . Sulfa  Antibiotics Hives and Rash    MEDICATIONS: Current Outpatient Medications on File Prior to Visit  Medication Sig Dispense Refill  . acetaminophen (TYLENOL) 500 MG tablet Take 1 tablet (500 mg total) by mouth every 6 (six) hours as needed. 90 tablet 0  . allopurinol (ZYLOPRIM) 100 MG tablet Take 1 tablet by mouth daily.    Marland Kitchen amLODipine (NORVASC) 5 MG tablet Take 5 mg by mouth at bedtime.    . carvedilol (COREG) 25 MG tablet Take 1 tablet (25 mg total) by mouth 2 (two) times daily with a meal. 30 tablet 0  . chlorthalidone (HYGROTON) 25 MG tablet Take 25 mg by mouth daily.    . colchicine 0.6 MG tablet Take 0.6 mg by mouth daily.    . diclofenac (VOLTAREN) 75 MG EC tablet Take 1 tablet (75 mg total) by mouth 2 (two) times daily. 50 tablet 2  . levothyroxine (SYNTHROID) 25 MCG tablet Take 1 tablet (25 mcg total) by mouth daily. 90 tablet 3  . lisinopril (PRINIVIL,ZESTRIL) 40 MG tablet Take 40 mg by mouth daily.    . Multiple Vitamins-Minerals (MULTIVITAMIN WITH MINERALS) tablet Take 1 tablet by mouth daily.    . Semaglutide (RYBELSUS) 3 MG TABS Take 3 mg by mouth daily. 30 tablet 0  . spironolactone (ALDACTONE) 100 MG tablet Take 100 mg by mouth daily.     No current facility-administered medications on file prior to visit.     PAST MEDICAL HISTORY: Past Medical History:  Diagnosis Date  . Abnormal Pap smear   . Allergy   . Anxiety    OCCAS PANIC ATTACKS  . Back pain   . Bilateral swelling of feet   . Blood transfusion 1990'S  . Blood transfusion without reported diagnosis   . BV (bacterial vaginosis)   . Cough    nonproductive cough last 2 weeks  . Depression 2010  . Fibroid 2011  . Gallbladder problem   . GERD (gastroesophageal reflux disease)    occasional  . H/O dysmenorrhea   . H/O varicella   . H/O: menorrhagia 2011  . Hypertension   . Hypokalemia    PAST HX  . Hypothyroid   . Increased BMI   . Kidney problem   . Kidney stones   . Obesity   . OSA (obstructive  sleep apnea) 04/22/2013  . Ovarian cyst 2011  . Perimenopausal symptoms 07/15/2010  . Pre-diabetes   . Pregnancy induced hypertension   . Shortness of breath    ONLY WITH ANXIETY  . Sleep apnea    PT USES CPAP SOMETIMES - SETTING IS 3  . Vitamin D deficiency   . Weight loss 2010    PAST SURGICAL HISTORY: Past Surgical History:  Procedure Laterality Date  . C-SECTIONS X 2    . [place]  . CHOLECYSTECTOMY  1994  . NEPHROLITHOTOMY  04/09/2011   Procedure: NEPHROLITHOTOMY PERCUTANEOUS;  Surgeon: Molli Hazard, MD;  Location: [facility];  Service: Urology;  Laterality: Right;      . NEPHROLITHOTOMY  10/15/2011   Procedure: NEPHROLITHOTOMY PERCUTANEOUS;  Surgeon: Molli Hazard, MD;  Location: [facility];  Service: Urology;  Laterality: Left;  . PERCUTANEOUS NEPHROSTOMY AROUND 1996  may  2013   right kidney  . REMOVAL OF STONES  10/15/2011   Procedure: REMOVAL OF STONES;  Surgeon: Molli Hazard, MD;  Location: [facility];  Service: Urology;  Laterality: Left;  . TUBAL LIGATION  1999  . URETEROSCOPY  04/09/2011   Procedure: URETEROSCOPY;  Surgeon: Molli Hazard, MD;  Location: [facility];  Service: Urology;  Laterality: Right;  . UTERINE ABLATION MARCH 2010  march 2011    SOCIAL HISTORY: Social History   Tobacco Use  . Smoking status: Former Smoker    Packs/day: 0.25    Years: 15.00    Pack years: 3.75  . Smokeless tobacco: Never Used  Substance Use Topics  . Alcohol use: No  . Drug use: No    FAMILY HISTORY: Family History  Problem Relation Age of Onset  . Diabetes Mother   . Hypertension Mother   . Obesity Mother   . Hypertension Father   . Diabetes Father   . Kidney disease Father   . Stroke Father   . Sleep apnea Father   . Diabetes Paternal Grandmother   . Diabetes Maternal Grandmother   . Heart disease Maternal Grandmother     ROS: Review of Systems  Constitutional: Positive for weight loss.  Cardiovascular: Negative for  chest pain and claudication.  Musculoskeletal: Negative for myalgias.  Endo/Heme/Allergies:       Negative hypoglycemia    PHYSICAL EXAM: Pulse 83, temperature 98.8 F (37.1 C), temperature source Oral, height 5\' 4"  (1.626 m), weight 280 lb 9.6 oz (127.3 kg), SpO2 95 %. Body mass index is 48.16 kg/m. Physical Exam Vitals signs reviewed.  Constitutional:      Appearance: Normal appearance. She is obese.  Cardiovascular:     Rate and Rhythm: Normal rate.     Pulses: Normal pulses.  Pulmonary:     Effort: Pulmonary effort is normal.     Breath sounds: Normal breath sounds.  Musculoskeletal: Normal range of motion.  Skin:    General: Skin is warm and dry.  Neurological:     Mental Status: She is alert and oriented to person, place, and time.  Psychiatric:        Mood and Affect: Mood normal.        Behavior: Behavior normal.     RECENT LABS AND TESTS: BMET    Component Value Date/Time   NA 138 07/28/2018 1452   K 4.4 07/28/2018 1452   CL 99 07/28/2018 1452   CO2 21 07/28/2018 1452   GLUCOSE 127 (H) 07/28/2018 1452   GLUCOSE 98 07/25/2012 1350   BUN 30 (H) 07/28/2018 1452   CREATININE 1.53 (H) 07/28/2018 1452   CREATININE 1.14 (H) 05/31/2012 1653   CALCIUM 9.6 07/28/2018 1452   GFRNONAA 40 (L) 07/28/2018 1452   GFRAA 46 (L) 07/28/2018 1452   Lab Results  Component Value Date   HGBA1C 6.7 (H) 07/28/2018   HGBA1C 5.3 09/24/2017   HGBA1C 5.1 04/15/2017   HGBA1C 6.0 (H) 01/14/2012   HGBA1C 5.6 12/28/2011   Lab Results  Component Value Date   INSULIN 56.2 (H) 07/28/2018   CBC    Component Value Date/Time   WBC 8.2 05/17/2018 1557   WBC 7.9 09/24/2017 0937   WBC 7.3 07/25/2012 1350   RBC 3.90 05/17/2018 1557   RBC 4.13 09/24/2017 0937   RBC 4.23 07/25/2012 1350   HGB 12.3 05/17/2018 1557   HCT 34.9 05/17/2018 1557   PLT 286 05/17/2018 1557   MCV 90 05/17/2018 1557  MCH 31.5 05/17/2018 1557   MCH 31.3 (A) 09/24/2017 0937   MCH 29.8 07/25/2012 1350    MCHC 35.2 05/17/2018 1557   MCHC 34.0 09/24/2017 0937   MCHC 33.9 07/25/2012 1350   RDW 14.2 05/17/2018 1557   LYMPHSABS 1.9 06/01/2012 1920   MONOABS 0.6 06/01/2012 1920   EOSABS 0.5 06/01/2012 1920   BASOSABS 0.0 06/01/2012 1920   Iron/TIBC/Ferritin/ %Sat No results found for: IRON, TIBC, FERRITIN, IRONPCTSAT Lipid Panel     Component Value Date/Time   CHOL 255 (H) 07/28/2018 1452   TRIG 248 (H) 07/28/2018 1452   HDL 50 07/28/2018 1452   CHOLHDL 4.8 08/20/2010 0453   VLDL 71 (H) 08/20/2010 0453   LDLCALC 155 (H) 07/28/2018 1452   Hepatic Function Panel     Component Value Date/Time   PROT 7.1 07/28/2018 1452   ALBUMIN 4.3 07/28/2018 1452   AST 12 07/28/2018 1452   ALT 27 07/28/2018 1452   ALKPHOS 82 07/28/2018 1452   BILITOT 0.5 07/28/2018 1452      Component Value Date/Time   TSH 1.450 05/17/2018 1557   TSH 2.400 04/15/2017 0932   TSH 1.535 04/12/2014 1228      OBESITY BEHAVIORAL INTERVENTION VISIT  Today's visit was # 5   Starting weight: 294 lbs Starting date: 07/28/2018 Today's weight : 280 lbs  Today's date: 09/22/2018 Total lbs lost to date: 14    ASK: We discussed the diagnosis of obesity with [name] today and Laityn agreed to give [place] permission to discuss obesity behavioral modification therapy today.  ASSESS: Brigit has the diagnosis of obesity and her BMI today is 48.14 Laloni is in the action stage of change   ADVISE: Claudia was educated on the multiple health risks of obesity as well as the benefit of weight loss to improve her health. She was advised of the need for long term treatment and the importance of lifestyle modifications to improve her current health and to decrease her risk of future health problems.  AGREE: Multiple dietary modification options and treatment options were discussed and  Laketra agreed to follow the recommendations documented in the above note.  ARRANGE: Mckenzye was educated on the  importance of frequent visits to treat obesity as outlined per CMS and USPSTF guidelines and agreed to schedule her next follow up appointment today.  I, Trixie Dredge, am acting as transcriptionist for Ilene Qua, MD  I have reviewed the above documentation for accuracy and completeness, and I agree with the above. - Ilene Qua, MD

## 2018-10-06 ENCOUNTER — Ambulatory Visit (INDEPENDENT_AMBULATORY_CARE_PROVIDER_SITE_OTHER): Payer: 59 | Admitting: Family Medicine

## 2018-10-06 ENCOUNTER — Encounter (INDEPENDENT_AMBULATORY_CARE_PROVIDER_SITE_OTHER): Payer: Self-pay | Admitting: Family Medicine

## 2018-10-06 ENCOUNTER — Other Ambulatory Visit: Payer: Self-pay

## 2018-10-06 VITALS — BP 124/85 | HR 79 | Temp 98.7°F | Ht 64.0 in | Wt 279.0 lb

## 2018-10-06 DIAGNOSIS — F329 Major depressive disorder, single episode, unspecified: Secondary | ICD-10-CM

## 2018-10-06 DIAGNOSIS — F419 Anxiety disorder, unspecified: Secondary | ICD-10-CM

## 2018-10-06 DIAGNOSIS — E1165 Type 2 diabetes mellitus with hyperglycemia: Secondary | ICD-10-CM

## 2018-10-06 DIAGNOSIS — Z6841 Body Mass Index (BMI) 40.0 and over, adult: Secondary | ICD-10-CM

## 2018-10-06 MED ORDER — RYBELSUS 7 MG PO TABS: 7.0000 mg | ORAL_TABLET | Freq: Every day | ORAL | 0 refills | Status: DC

## 2018-10-06 MED ORDER — SERTRALINE HCL 25 MG PO TABS: 25.0000 mg | ORAL_TABLET | Freq: Every day | ORAL | 0 refills | Status: DC

## 2018-10-06 NOTE — Progress Notes (Signed)
Office: [phone]  /  Fax: [phone]   HPI:   Chief Complaint: OBESITY Robyn [name] is here to discuss her progress with her obesity treatment plan. She is on the Category 3 plan and is following her eating plan approximately 80 % of the time. She states she is exercising 0 minutes 0 times per week. Robyn [name] is tired today. She is having trouble staying asleep and she is only able to stay asleep approximately 5 hours, and then she is awake for 1 to 2 hours. Her weight is 279 lb (126.6 kg) today and has had a weight loss of 1 pound over a period of 2 weeks since her last visit. She has lost 15 lbs since starting treatment with [place].  Diabetes II with hyperglycemia, non-insulin Robyn [name] has a diagnosis of diabetes type II. Robyn [name] denies any GI side effects of Rybelsus. Last A1c was at 6.7 She has been working on intensive lifestyle modifications including diet, exercise, and weight loss to help control her blood glucose levels. Robyn [name] still has occasional carb indulgences.  Anxiety and Depression Robyn [name] has a diagnosis of anxiety and depression and she has had an increase in symptoms recently. She shows no sign of suicidal or homicidal ideations.  ASSESSMENT AND PLAN:  Type 2 diabetes mellitus with hyperglycemia, without long-term current use of insulin (HCC) - Plan: Semaglutide (RYBELSUS) 7 MG TABS  Anxiety and depression - Plan: sertraline (ZOLOFT) 25 MG tablet  Class 3 severe obesity with serious comorbidity and body mass index (BMI) of 45.0 to 49.9 in adult, unspecified obesity type ([place])  PLAN:  Diabetes II with hyperglycemia, non-insulin Robyn [name] has been given extensive diabetes education by myself today including ideal fasting and post-prandial blood glucose readings, individual ideal Hgb A1c goals and hypoglycemia prevention. We discussed the importance of good blood sugar control to decrease the likelihood of diabetic complications such as nephropathy, neuropathy,  limb loss, blindness, coronary artery disease, and death. We discussed the importance of intensive lifestyle modification including diet, exercise and weight loss as the first line treatment for diabetes. Robyn [name] agrees to increase Rybelsus to 7 mg daily #30 with no refills and follow up at the agreed upon time.  Anxiety and Depression Robyn [name] agrees to start Zoloft 25 mg daily #30 with no refills and follow up with our clinic as directed.  Obesity Robyn [name] is currently in the action stage of change. As such, her goal is to continue with weight loss efforts She has agreed to follow the Category 3 plan Robyn [name] has been instructed to work up to a goal of 150 minutes of combined cardio and strengthening exercise per week for weight loss and overall health benefits. We discussed the following Behavioral Modification Strategies today: planning for success, keeping healthy foods in the home, increasing lean protein intake, increasing vegetables and work on meal planning and easy cooking plans  Robyn [name] has agreed to follow up with our clinic in 2 weeks. She was informed of the importance of frequent follow up visits to maximize her success with intensive lifestyle modifications for her multiple health conditions.  ALLERGIES: Allergies  Allergen Reactions  . Sulfa Antibiotics Hives and Rash    MEDICATIONS: Current Outpatient Medications on File Prior to Visit  Medication Sig Dispense Refill  . acetaminophen (TYLENOL) 500 MG tablet Take 1 tablet (500 mg total) by mouth every 6 (six) hours as needed. 90 tablet 0  . allopurinol (ZYLOPRIM) 100 MG tablet Take 1 tablet by mouth daily.    Marland Kitchen amLODipine (NORVASC) 5  MG tablet Take 5 mg by mouth at bedtime.    . carvedilol (COREG) 25 MG tablet Take 1 tablet (25 mg total) by mouth 2 (two) times daily with a meal. 30 tablet 0  . chlorthalidone (HYGROTON) 25 MG tablet Take 25 mg by mouth daily.    . colchicine 0.6 MG tablet Take 0.6 mg by mouth daily.     . diclofenac (VOLTAREN) 75 MG EC tablet Take 1 tablet (75 mg total) by mouth 2 (two) times daily. 50 tablet 2  . levothyroxine (SYNTHROID) 25 MCG tablet Take 1 tablet (25 mcg total) by mouth daily. 90 tablet 3  . lisinopril (PRINIVIL,ZESTRIL) 40 MG tablet Take 40 mg by mouth daily.    . Multiple Vitamins-Minerals (MULTIVITAMIN WITH MINERALS) tablet Take 1 tablet by mouth daily.    Marland Kitchen spironolactone (ALDACTONE) 100 MG tablet Take 100 mg by mouth daily.     No current facility-administered medications on file prior to visit.     PAST MEDICAL HISTORY: Past Medical History:  Diagnosis Date  . Abnormal Pap smear   . Allergy   . Anxiety    OCCAS PANIC ATTACKS  . Back pain   . Bilateral swelling of feet   . Blood transfusion 1990'S  . Blood transfusion without reported diagnosis   . BV (bacterial vaginosis)   . Cough    nonproductive cough last 2 weeks  . Depression 2010  . Fibroid 2011  . Gallbladder problem   . GERD (gastroesophageal reflux disease)    occasional  . H/O dysmenorrhea   . H/O varicella   . H/O: menorrhagia 2011  . Hypertension   . Hypokalemia    PAST HX  . Hypothyroid   . Increased BMI   . Kidney problem   . Kidney stones   . Obesity   . OSA (obstructive sleep apnea) 04/22/2013  . Ovarian cyst 2011  . Perimenopausal symptoms 07/15/2010  . Pre-diabetes   . Pregnancy induced hypertension   . Shortness of breath    ONLY WITH ANXIETY  . Sleep apnea    PT USES CPAP SOMETIMES - SETTING IS 3  . Vitamin D deficiency   . Weight loss 2010    PAST SURGICAL HISTORY: Past Surgical History:  Procedure Laterality Date  . C-SECTIONS X 2    . [place]  . CHOLECYSTECTOMY  1994  . NEPHROLITHOTOMY  04/09/2011   Procedure: NEPHROLITHOTOMY PERCUTANEOUS;  Surgeon: Molli Hazard, MD;  Location: [facility];  Service: Urology;  Laterality: Right;      . NEPHROLITHOTOMY  10/15/2011   Procedure: NEPHROLITHOTOMY PERCUTANEOUS;  Surgeon: Molli Hazard, MD;  Location: [facility];  Service: Urology;  Laterality: Left;  . PERCUTANEOUS NEPHROSTOMY AROUND 1996  may 2013   right kidney  . REMOVAL OF STONES  10/15/2011   Procedure: REMOVAL OF STONES;  Surgeon: Molli Hazard, MD;  Location: [facility];  Service: Urology;  Laterality: Left;  . TUBAL LIGATION  1999  . URETEROSCOPY  04/09/2011   Procedure: URETEROSCOPY;  Surgeon: Molli Hazard, MD;  Location: [facility];  Service: Urology;  Laterality: Right;  . UTERINE ABLATION MARCH 2010  march 2011    SOCIAL HISTORY: Social History   Tobacco Use  . Smoking status: Former Smoker    Packs/day: 0.25    Years: 15.00    Pack years: 3.75  . Smokeless tobacco: Never Used  Substance Use Topics  . Alcohol use: No  . Drug use:  No    FAMILY HISTORY: Family History  Problem Relation Age of Onset  . Diabetes Mother   . Hypertension Mother   . Obesity Mother   . Hypertension Father   . Diabetes Father   . Kidney disease Father   . Stroke Father   . Sleep apnea Father   . Diabetes Paternal Grandmother   . Diabetes Maternal Grandmother   . Heart disease Maternal Grandmother     ROS: Review of Systems  Constitutional: Positive for weight loss.  Gastrointestinal: Negative for diarrhea, nausea and vomiting.  Psychiatric/Behavioral: Positive for depression. Negative for suicidal ideas. The patient is nervous/anxious.     PHYSICAL EXAM: Blood pressure 124/85, pulse 79, temperature 98.7 F (37.1 C), temperature source Oral, height 5\' 4"  (1.626 m), weight 279 lb (126.6 kg), SpO2 95 %. Body mass index is 47.89 kg/m. Physical Exam Vitals signs reviewed.  Constitutional:      Appearance: Normal appearance. She is well-developed. She is obese.  Cardiovascular:     Rate and Rhythm: Normal rate.  Pulmonary:     Effort: Pulmonary effort is normal.  Musculoskeletal: Normal range of motion.  Skin:    General: Skin is warm and dry.  Neurological:     Mental Status: She is alert  and oriented to person, place, and time.  Psychiatric:        Mood and Affect: Mood is anxious.        Behavior: Behavior normal.        Thought Content: Thought content does not include homicidal or suicidal ideation.     RECENT LABS AND TESTS: BMET    Component Value Date/Time   NA 138 07/28/2018 1452   K 4.4 07/28/2018 1452   CL 99 07/28/2018 1452   CO2 21 07/28/2018 1452   GLUCOSE 127 (H) 07/28/2018 1452   GLUCOSE 98 07/25/2012 1350   BUN 30 (H) 07/28/2018 1452   CREATININE 1.53 (H) 07/28/2018 1452   CREATININE 1.14 (H) 05/31/2012 1653   CALCIUM 9.6 07/28/2018 1452   GFRNONAA 40 (L) 07/28/2018 1452   GFRAA 46 (L) 07/28/2018 1452   Lab Results  Component Value Date   HGBA1C 6.7 (H) 07/28/2018   HGBA1C 5.3 09/24/2017   HGBA1C 5.1 04/15/2017   HGBA1C 6.0 (H) 01/14/2012   HGBA1C 5.6 12/28/2011   Lab Results  Component Value Date   INSULIN 56.2 (H) 07/28/2018   CBC    Component Value Date/Time   WBC 8.2 05/17/2018 1557   WBC 7.9 09/24/2017 0937   WBC 7.3 07/25/2012 1350   RBC 3.90 05/17/2018 1557   RBC 4.13 09/24/2017 0937   RBC 4.23 07/25/2012 1350   HGB 12.3 05/17/2018 1557   HCT 34.9 05/17/2018 1557   PLT 286 05/17/2018 1557   MCV 90 05/17/2018 1557   MCH 31.5 05/17/2018 1557   MCH 31.3 (A) 09/24/2017 0937   MCH 29.8 07/25/2012 1350   MCHC 35.2 05/17/2018 1557   MCHC 34.0 09/24/2017 0937   MCHC 33.9 07/25/2012 1350   RDW 14.2 05/17/2018 1557   LYMPHSABS 1.9 06/01/2012 1920   MONOABS 0.6 06/01/2012 1920   EOSABS 0.5 06/01/2012 1920   BASOSABS 0.0 06/01/2012 1920   Iron/TIBC/Ferritin/ %Sat No results found for: IRON, TIBC, FERRITIN, IRONPCTSAT Lipid Panel     Component Value Date/Time   CHOL 255 (H) 07/28/2018 1452   TRIG 248 (H) 07/28/2018 1452   HDL 50 07/28/2018 1452   CHOLHDL 4.8 08/20/2010 0453   VLDL 71 (H) 08/20/2010  0453   LDLCALC 155 (H) 07/28/2018 1452   Hepatic Function Panel     Component Value Date/Time   PROT 7.1 07/28/2018  1452   ALBUMIN 4.3 07/28/2018 1452   AST 12 07/28/2018 1452   ALT 27 07/28/2018 1452   ALKPHOS 82 07/28/2018 1452   BILITOT 0.5 07/28/2018 1452      Component Value Date/Time   TSH 1.450 05/17/2018 1557   TSH 2.400 04/15/2017 0932   TSH 1.535 04/12/2014 1228     Ref. Range 05/17/2018 15:57  Vitamin D, 25-Hydroxy Latest Ref Range: 30.0 - 100.0 ng/mL 28.6 (L)    OBESITY BEHAVIORAL INTERVENTION VISIT  Today's visit was # 6   Starting weight: 294 lbs Starting date: 07/28/2018 Today's weight : 279 lbs Today's date: 10/06/2018 Total lbs lost to date: 15    10/06/2018  Height 5\' 4"  (1.626 m)  Weight 279 lb (126.6 kg)  BMI (Calculated) 47.87  BLOOD PRESSURE - SYSTOLIC 497  BLOOD PRESSURE - DIASTOLIC 85   Body Fat % 02.6 %  Total Body Water (lbs) 91.8 lbs    ASK: We discussed the diagnosis of obesity with Robyn [name] today and Robyn [name] agreed to give [place] permission to discuss obesity behavioral modification therapy today.  ASSESS: Robyn [name] has the diagnosis of obesity and her BMI today is 47.87 Robyn [name] is in the action stage of change   ADVISE: Robyn [name] was educated on the multiple health risks of obesity as well as the benefit of weight loss to improve her health. She was advised of the need for long term treatment and the importance of lifestyle modifications to improve her current health and to decrease her risk of future health problems.  AGREE: Multiple dietary modification options and treatment options were discussed and  Robyn [name] agreed to follow the recommendations documented in the above note.  ARRANGE: Robyn [name] was educated on the importance of frequent visits to treat obesity as outlined per CMS and USPSTF guidelines and agreed to schedule her next follow up appointment today.  I, Doreene Nest, am acting as transcriptionist for Eber Jones, MD  I have reviewed the above documentation for accuracy and completeness, and I agree with the  above. - Ilene Qua, MD

## 2018-10-20 ENCOUNTER — Ambulatory Visit (INDEPENDENT_AMBULATORY_CARE_PROVIDER_SITE_OTHER): Payer: 59 | Admitting: Family Medicine

## 2018-10-20 ENCOUNTER — Other Ambulatory Visit: Payer: Self-pay

## 2018-10-20 VITALS — BP 150/94 | HR 66 | Temp 98.4°F | Ht 64.0 in | Wt 280.0 lb

## 2018-10-20 DIAGNOSIS — F329 Major depressive disorder, single episode, unspecified: Secondary | ICD-10-CM

## 2018-10-20 DIAGNOSIS — Z9189 Other specified personal risk factors, not elsewhere classified: Secondary | ICD-10-CM | POA: Diagnosis not present

## 2018-10-20 DIAGNOSIS — F32A Depression, unspecified: Secondary | ICD-10-CM

## 2018-10-20 DIAGNOSIS — I10 Essential (primary) hypertension: Secondary | ICD-10-CM

## 2018-10-20 DIAGNOSIS — E1165 Type 2 diabetes mellitus with hyperglycemia: Secondary | ICD-10-CM | POA: Diagnosis not present

## 2018-10-20 DIAGNOSIS — Z6841 Body Mass Index (BMI) 40.0 and over, adult: Secondary | ICD-10-CM

## 2018-10-20 DIAGNOSIS — F419 Anxiety disorder, unspecified: Secondary | ICD-10-CM

## 2018-10-20 MED ORDER — SERTRALINE HCL 25 MG PO TABS: 25.0000 mg | ORAL_TABLET | Freq: Every day | ORAL | 0 refills | Status: DC

## 2018-10-21 NOTE — Progress Notes (Signed)
Office: [phone]  /  Fax: [phone]   HPI:   Chief Complaint: OBESITY Robyn [name] is here to discuss her progress with her obesity treatment plan. She is on the Category 3 plan and is following her eating plan approximately 85 % of the time. She states she is exercising 0 minutes 0 times per week. Robyn [name] voices she is "hanging in". She is still waking up with a headache but having a lot of stress. She doesn't feel she has been as committed in the past couple of weeks. She is not measuring her food and just guessing. She is getting back on eating breakfast.  Her weight is 280 lb (127 kg) today and has gained 1 lb since her last visit. She has lost 14 lbs since starting treatment with [place].  Diabetes II with Hyperglycemia Robyn [name] has a diagnosis of diabetes type II. Robyn [name] is on Rybelsus and denies GI side effects. She denies feelings of low blood sugars. Last A1c was 6.7. She has been working on intensive lifestyle modifications including diet, exercise, and weight loss to help control her blood glucose levels.  Hypertension Robyn [name] is a 48 y.o. female with hypertension. Robyn [name]'s blood pressure was elevated today (outlier). She denies chest pain, chest pressure, or headaches. She is working on weight loss to help control her blood pressure with the goal of decreasing her risk of heart attack and stroke.  At risk for cardiovascular disease Robyn [name] is at a higher than average risk for cardiovascular disease due to obesity, diabetes II, and hypertension. She currently denies any chest pain.  Depression with Anxiety Robyn [name] struggles with depression and anxiety. She notes her symptoms are improving on sertraline. She shows no sign of suicidal or homicidal ideations.  ASSESSMENT AND PLAN:  Anxiety and depression - Plan: sertraline (ZOLOFT) 25 MG tablet  Type 2 diabetes mellitus with hyperglycemia, without long-term current use of insulin (HCC)  Essential  hypertension  At risk for heart disease  Class 3 severe obesity with serious comorbidity and body mass index (BMI) of 45.0 to 49.9 in adult, unspecified obesity type ([place])  PLAN:  Diabetes II with Hyperglycemia Robyn [name] has been given extensive diabetes education by myself today including ideal fasting and post-prandial blood glucose readings, individual ideal Hgb A1c goals and hypoglycemia prevention. We discussed the importance of good blood sugar control to decrease the likelihood of diabetic complications such as nephropathy, neuropathy, limb loss, blindness, coronary artery disease, and death. We discussed the importance of intensive lifestyle modification including diet, exercise and weight loss as the first line treatment for diabetes. Robyn [name] agrees to continue taking Rybelsus, no refill needed. Robyn [name] agrees to follow up with our clinic in 2 weeks.  Hypertension We discussed sodium restriction, working on healthy weight loss, and a regular exercise program as the means to achieve improved blood pressure control. Robyn [name] agreed with this plan and agreed to follow up as directed. We will continue to monitor her blood pressure as well as her progress with the above lifestyle modifications. Robyn [name] agrees to continue her medications and will watch for signs of hypotension as she continues her lifestyle modifications. We will follow up on her blood pressure at her next appointment. Robyn [name] agrees to follow up with our clinic in 2 weeks.  Cardiovascular risk counseling Robyn [name] was given extended (15 minutes) coronary artery disease prevention counseling today. She is 48 y.o. female and has risk factors for heart disease including obesity, diabetes II, and hypertension. We discussed intensive lifestyle modifications today with  an emphasis on specific weight loss instructions and strategies. Pt was also informed of the importance of increasing exercise and decreasing saturated fats to  help prevent heart disease.  Depression with Anxiety We discussed behavior modification techniques today to help Robyn [name] deal with her depression and anxiety. Robyn [name] agrees to continue taking sertraline 25 mg PO daily #30 and we will refill for 1 month. Robyn [name] agrees to follow up with our clinic in 2 weeks.   Obesity Robyn [name] is currently in the action stage of change. As such, her goal is to continue with weight loss efforts She has agreed to follow the Category 3 plan Robyn [name] has been instructed to work up to a goal of 150 minutes of combined cardio and strengthening exercise per week for weight loss and overall health benefits. We discussed the following Behavioral Modification Strategies today: increasing lean protein intake, increasing vegetables and work on meal planning and easy cooking plans, keeping healthy foods in the home, and planning for success   Robyn [name] has agreed to follow up with our clinic in 2 weeks. She was informed of the importance of frequent follow up visits to maximize her success with intensive lifestyle modifications for her multiple health conditions.  ALLERGIES: Allergies  Allergen Reactions   Sulfa Antibiotics Hives and Rash    MEDICATIONS: Current Outpatient Medications on File Prior to Visit  Medication Sig Dispense Refill   acetaminophen (TYLENOL) 500 MG tablet Take 1 tablet (500 mg total) by mouth every 6 (six) hours as needed. (Patient taking differently: Take 500 mg by mouth every 6 (six) hours as needed for moderate pain. ) 90 tablet 0   allopurinol (ZYLOPRIM) 100 MG tablet Take 100 mg by mouth daily.      amLODipine (NORVASC) 5 MG tablet Take 5 mg by mouth at bedtime.     carvedilol (COREG) 25 MG tablet Take 1 tablet (25 mg total) by mouth 2 (two) times daily with a meal. 30 tablet 0   chlorthalidone (HYGROTON) 25 MG tablet Take 25 mg by mouth daily.     colchicine 0.6 MG tablet Take 0.6 mg by mouth daily as needed (gout).       levothyroxine (SYNTHROID) 25 MCG tablet Take 1 tablet (25 mcg total) by mouth daily. 90 tablet 3   lisinopril (PRINIVIL,ZESTRIL) 40 MG tablet Take 40 mg by mouth daily.     Multiple Vitamins-Minerals (MULTIVITAMIN WITH MINERALS) tablet Take 1 tablet by mouth daily.     Semaglutide (RYBELSUS) 7 MG TABS Take 7 mg by mouth daily. 30 tablet 0   No current facility-administered medications on file prior to visit.     PAST MEDICAL HISTORY: Past Medical History:  Diagnosis Date   Abnormal Pap smear    Allergy    Anxiety    OCCAS PANIC ATTACKS   Back pain    Bilateral swelling of feet    Blood transfusion 1990'S   Blood transfusion without reported diagnosis    BV (bacterial vaginosis)    Cough    nonproductive cough last 2 weeks   Depression 2010   Fibroid 2011   Gallbladder problem    GERD (gastroesophageal reflux disease)    occasional   H/O dysmenorrhea    H/O varicella    H/O: menorrhagia 2011   Hypertension    Hypokalemia    PAST HX   Hypothyroid    Increased BMI    Kidney problem    Kidney stones    Obesity    OSA (obstructive sleep  apnea) 04/22/2013   Ovarian cyst 2011   Perimenopausal symptoms 07/15/2010   Pre-diabetes    Pregnancy induced hypertension    Shortness of breath    ONLY WITH ANXIETY   Sleep apnea    PT USES CPAP SOMETIMES - SETTING IS 3   Vitamin D deficiency    Weight loss 2010    PAST SURGICAL HISTORY: Past Surgical History:  Procedure Laterality Date   C-SECTIONS X 2     CESAREAN SECTION  1993 and [place]   NEPHROLITHOTOMY  04/09/2011   Procedure: NEPHROLITHOTOMY PERCUTANEOUS;  Surgeon: Molli Hazard, MD;  Location: [facility];  Service: Urology;  Laterality: Right;       NEPHROLITHOTOMY  10/15/2011   Procedure: NEPHROLITHOTOMY PERCUTANEOUS;  Surgeon: Molli Hazard, MD;  Location: [facility];  Service: Urology;  Laterality: Left;   PERCUTANEOUS NEPHROSTOMY AROUND 1996  may  2013   right kidney   REMOVAL OF STONES  10/15/2011   Procedure: REMOVAL OF STONES;  Surgeon: Molli Hazard, MD;  Location: [facility];  Service: Urology;  Laterality: Left;   TUBAL LIGATION  1999   URETEROSCOPY  04/09/2011   Procedure: URETEROSCOPY;  Surgeon: Molli Hazard, MD;  Location: [facility];  Service: Urology;  Laterality: Right;   UTERINE ABLATION MARCH 2010  march 2011    SOCIAL HISTORY: Social History   Tobacco Use   Smoking status: Former Smoker    Packs/day: 0.25    Years: 15.00    Pack years: 3.75   Smokeless tobacco: Never Used  Substance Use Topics   Alcohol use: No   Drug use: No    FAMILY HISTORY: Family History  Problem Relation Age of Onset   Diabetes Mother    Hypertension Mother    Obesity Mother    Hypertension Father    Diabetes Father    Kidney disease Father    Stroke Father    Sleep apnea Father    Diabetes Paternal Grandmother    Diabetes Maternal Grandmother    Heart disease Maternal Grandmother     ROS: Review of Systems  Constitutional: Negative for weight loss.  Cardiovascular: Negative for chest pain.       Negative chest pressure  Neurological: Negative for headaches.  Endo/Heme/Allergies:       Negative hypoglycemia  Psychiatric/Behavioral: Positive for depression. Negative for suicidal ideas.       + Anxiety    PHYSICAL EXAM: Blood pressure (!) 150/94, pulse 66, temperature 98.4 F (36.9 C), temperature source Oral, height 5\' 4"  (1.626 m), weight 280 lb (127 kg), SpO2 94 %. Body mass index is 48.06 kg/m. Physical Exam Vitals signs reviewed.  Constitutional:      Appearance: Normal appearance. She is obese.  Cardiovascular:     Rate and Rhythm: Normal rate.     Pulses: Normal pulses.  Pulmonary:     Effort: Pulmonary effort is normal.     Breath sounds: Normal breath sounds.  Musculoskeletal: Normal range of motion.  Skin:    General: Skin is warm and dry.  Neurological:     Mental  Status: She is alert and oriented to person, place, and time.  Psychiatric:        Mood and Affect: Mood normal.        Behavior: Behavior normal.     RECENT LABS AND TESTS: BMET    Component Value Date/Time   NA 138 07/28/2018 1452   K 4.4 07/28/2018 1452  CL 99 07/28/2018 1452   CO2 21 07/28/2018 1452   GLUCOSE 127 (H) 07/28/2018 1452   GLUCOSE 98 07/25/2012 1350   BUN 30 (H) 07/28/2018 1452   CREATININE 1.53 (H) 07/28/2018 1452   CREATININE 1.14 (H) 05/31/2012 1653   CALCIUM 9.6 07/28/2018 1452   GFRNONAA 40 (L) 07/28/2018 1452   GFRAA 46 (L) 07/28/2018 1452   Lab Results  Component Value Date   HGBA1C 6.7 (H) 07/28/2018   HGBA1C 5.3 09/24/2017   HGBA1C 5.1 04/15/2017   HGBA1C 6.0 (H) 01/14/2012   HGBA1C 5.6 12/28/2011   Lab Results  Component Value Date   INSULIN 56.2 (H) 07/28/2018   CBC    Component Value Date/Time   WBC 8.2 05/17/2018 1557   WBC 7.9 09/24/2017 0937   WBC 7.3 07/25/2012 1350   RBC 3.90 05/17/2018 1557   RBC 4.13 09/24/2017 0937   RBC 4.23 07/25/2012 1350   HGB 12.3 05/17/2018 1557   HCT 34.9 05/17/2018 1557   PLT 286 05/17/2018 1557   MCV 90 05/17/2018 1557   MCH 31.5 05/17/2018 1557   MCH 31.3 (A) 09/24/2017 0937   MCH 29.8 07/25/2012 1350   MCHC 35.2 05/17/2018 1557   MCHC 34.0 09/24/2017 0937   MCHC 33.9 07/25/2012 1350   RDW 14.2 05/17/2018 1557   LYMPHSABS 1.9 06/01/2012 1920   MONOABS 0.6 06/01/2012 1920   EOSABS 0.5 06/01/2012 1920   BASOSABS 0.0 06/01/2012 1920   Iron/TIBC/Ferritin/ %Sat No results found for: IRON, TIBC, FERRITIN, IRONPCTSAT Lipid Panel     Component Value Date/Time   CHOL 255 (H) 07/28/2018 1452   TRIG 248 (H) 07/28/2018 1452   HDL 50 07/28/2018 1452   CHOLHDL 4.8 08/20/2010 0453   VLDL 71 (H) 08/20/2010 0453   LDLCALC 155 (H) 07/28/2018 1452   Hepatic Function Panel     Component Value Date/Time   PROT 7.1 07/28/2018 1452   ALBUMIN 4.3 07/28/2018 1452   AST 12 07/28/2018 1452   ALT 27  07/28/2018 1452   ALKPHOS 82 07/28/2018 1452   BILITOT 0.5 07/28/2018 1452      Component Value Date/Time   TSH 1.450 05/17/2018 1557   TSH 2.400 04/15/2017 0932   TSH 1.535 04/12/2014 1228      OBESITY BEHAVIORAL INTERVENTION VISIT  Today's visit was # 7   Starting weight: 294 lbs Starting date: 07/28/2018 Today's weight : 280 lbs Today's date: 10/20/2018 Total lbs lost to date: 78    ASK: We discussed the diagnosis of obesity with [name] today and Shanell agreed to give [place] permission to discuss obesity behavioral modification therapy today.  ASSESS: Johan has the diagnosis of obesity and her BMI today is 48.04 [place] is in the action stage of change   ADVISE: Chanette was educated on the multiple health risks of obesity as well as the benefit of weight loss to improve her health. She was advised of the need for long term treatment and the importance of lifestyle modifications to improve her current health and to decrease her risk of future health problems.  AGREE: Multiple dietary modification options and treatment options were discussed and  Siriah agreed to follow the recommendations documented in the above note.  ARRANGE: Emoni was educated on the importance of frequent visits to treat obesity as outlined per CMS and USPSTF guidelines and agreed to schedule her next follow up appointment today.  Wilhemena Durie, am acting as transcriptionist for Ilene Qua, MD  I have reviewed  the above documentation for accuracy and completeness, and I agree with the above. - Ilene Qua, MD

## 2018-10-22 ENCOUNTER — Other Ambulatory Visit (HOSPITAL_COMMUNITY)
Admission: RE | Admit: 2018-10-22 | Discharge: 2018-10-22 | Disposition: A | Discharge status: Home / Self Care | Payer: 59 | Source: Ambulatory Visit | Attending: Gastroenterology | Admitting: Gastroenterology

## 2018-10-22 DIAGNOSIS — Z01812 Encounter for preprocedural laboratory examination: Secondary | ICD-10-CM | POA: Insufficient documentation

## 2018-10-22 DIAGNOSIS — Z20828 Contact with and (suspected) exposure to other viral communicable diseases: Secondary | ICD-10-CM | POA: Insufficient documentation

## 2018-10-23 LAB — NOVEL CORONAVIRUS, NAA (HOSP ORDER, SEND-OUT TO REF LAB; TAT 18-24 HRS): SARS-CoV-2, NAA: NOT DETECTED

## 2018-10-25 NOTE — Progress Notes (Signed)
Pre-op call attempted. No answer.

## 2018-10-25 NOTE — Progress Notes (Signed)
Attempted pre-call. Left message

## 2018-10-26 ENCOUNTER — Encounter (HOSPITAL_COMMUNITY): Payer: Self-pay | Admitting: Anesthesiology

## 2018-10-26 ENCOUNTER — Ambulatory Visit (HOSPITAL_COMMUNITY): Payer: 59 | Admitting: Anesthesiology

## 2018-10-26 ENCOUNTER — Other Ambulatory Visit: Payer: Self-pay

## 2018-10-26 ENCOUNTER — Encounter (HOSPITAL_COMMUNITY)
Admission: RE | Disposition: A | Discharge status: Home / Self Care | Payer: Self-pay | Source: Home / Self Care | Attending: Gastroenterology

## 2018-10-26 ENCOUNTER — Ambulatory Visit (HOSPITAL_COMMUNITY)
Admission: RE | Admit: 2018-10-26 | Discharge: 2018-10-26 | Disposition: A | Discharge status: Home / Self Care | Payer: 59 | Attending: Gastroenterology | Admitting: Gastroenterology

## 2018-10-26 DIAGNOSIS — Z882 Allergy status to sulfonamides status: Secondary | ICD-10-CM | POA: Insufficient documentation

## 2018-10-26 DIAGNOSIS — F419 Anxiety disorder, unspecified: Secondary | ICD-10-CM | POA: Diagnosis not present

## 2018-10-26 DIAGNOSIS — Z79899 Other long term (current) drug therapy: Secondary | ICD-10-CM | POA: Insufficient documentation

## 2018-10-26 DIAGNOSIS — Z823 Family history of stroke: Secondary | ICD-10-CM | POA: Diagnosis not present

## 2018-10-26 DIAGNOSIS — E559 Vitamin D deficiency, unspecified: Secondary | ICD-10-CM | POA: Diagnosis not present

## 2018-10-26 DIAGNOSIS — Z9049 Acquired absence of other specified parts of digestive tract: Secondary | ICD-10-CM | POA: Insufficient documentation

## 2018-10-26 DIAGNOSIS — Z8249 Family history of ischemic heart disease and other diseases of the circulatory system: Secondary | ICD-10-CM | POA: Insufficient documentation

## 2018-10-26 DIAGNOSIS — G4700 Insomnia, unspecified: Secondary | ICD-10-CM | POA: Diagnosis not present

## 2018-10-26 DIAGNOSIS — Z6841 Body Mass Index (BMI) 40.0 and over, adult: Secondary | ICD-10-CM | POA: Diagnosis not present

## 2018-10-26 DIAGNOSIS — I10 Essential (primary) hypertension: Secondary | ICD-10-CM | POA: Insufficient documentation

## 2018-10-26 DIAGNOSIS — G4730 Sleep apnea, unspecified: Secondary | ICD-10-CM | POA: Diagnosis not present

## 2018-10-26 DIAGNOSIS — R7303 Prediabetes: Secondary | ICD-10-CM | POA: Diagnosis not present

## 2018-10-26 DIAGNOSIS — Z1211 Encounter for screening for malignant neoplasm of colon: Secondary | ICD-10-CM | POA: Insufficient documentation

## 2018-10-26 DIAGNOSIS — R0602 Shortness of breath: Secondary | ICD-10-CM | POA: Insufficient documentation

## 2018-10-26 DIAGNOSIS — K219 Gastro-esophageal reflux disease without esophagitis: Secondary | ICD-10-CM | POA: Diagnosis not present

## 2018-10-26 DIAGNOSIS — Z833 Family history of diabetes mellitus: Secondary | ICD-10-CM | POA: Insufficient documentation

## 2018-10-26 DIAGNOSIS — Z841 Family history of disorders of kidney and ureter: Secondary | ICD-10-CM | POA: Insufficient documentation

## 2018-10-26 DIAGNOSIS — F329 Major depressive disorder, single episode, unspecified: Secondary | ICD-10-CM | POA: Insufficient documentation

## 2018-10-26 DIAGNOSIS — Z87891 Personal history of nicotine dependence: Secondary | ICD-10-CM | POA: Diagnosis not present

## 2018-10-26 DIAGNOSIS — Z87442 Personal history of urinary calculi: Secondary | ICD-10-CM | POA: Diagnosis not present

## 2018-10-26 DIAGNOSIS — E039 Hypothyroidism, unspecified: Secondary | ICD-10-CM | POA: Diagnosis not present

## 2018-10-26 DIAGNOSIS — Z836 Family history of other diseases of the respiratory system: Secondary | ICD-10-CM | POA: Diagnosis not present

## 2018-10-26 HISTORY — PX: COLONOSCOPY WITH PROPOFOL: SHX5780

## 2018-10-26 LAB — GLUCOSE, CAPILLARY: Glucose-Capillary: 113 mg/dL — ABNORMAL HIGH (ref 70–99)

## 2018-10-26 SURGERY — COLONOSCOPY WITH PROPOFOL
Anesthesia: Monitor Anesthesia Care

## 2018-10-26 MED ORDER — SODIUM CHLORIDE 0.9 % IV SOLN: INTRAVENOUS | Status: DC

## 2018-10-26 MED ORDER — PROPOFOL 500 MG/50ML IV EMUL
INTRAVENOUS | Status: DC | PRN
  Administered 2018-10-26: 125 ug/kg/min via INTRAVENOUS

## 2018-10-26 MED ORDER — PROPOFOL 500 MG/50ML IV EMUL
INTRAVENOUS | Status: AC
  Filled 2018-10-26: qty 250

## 2018-10-26 MED ORDER — MIDAZOLAM HCL 5 MG/5ML IJ SOLN
INTRAMUSCULAR | Status: DC | PRN
  Administered 2018-10-26: 2 mg via INTRAVENOUS

## 2018-10-26 MED ORDER — PROPOFOL 10 MG/ML IV BOLUS
INTRAVENOUS | Status: AC
  Filled 2018-10-26: qty 20

## 2018-10-26 MED ORDER — LACTATED RINGERS IV SOLN
INTRAVENOUS | Status: DC
  Administered 2018-10-26: 07:00:00 via INTRAVENOUS

## 2018-10-26 MED ORDER — LACTATED RINGERS IV SOLN
INTRAVENOUS | Status: DC | PRN
  Administered 2018-10-26: 06:00:00 via INTRAVENOUS

## 2018-10-26 MED ORDER — ONDANSETRON HCL 4 MG/2ML IJ SOLN
INTRAMUSCULAR | Status: DC | PRN
  Administered 2018-10-26: 4 mg via INTRAVENOUS

## 2018-10-26 MED ORDER — PROPOFOL 500 MG/50ML IV EMUL
INTRAVENOUS | Status: DC | PRN
  Administered 2018-10-26: 40 mg via INTRAVENOUS
  Administered 2018-10-26: 30 mg via INTRAVENOUS

## 2018-10-26 MED ORDER — MIDAZOLAM HCL 2 MG/2ML IJ SOLN
INTRAMUSCULAR | Status: AC
  Filled 2018-10-26: qty 2

## 2018-10-26 SURGICAL SUPPLY — 22 items

## 2018-10-26 NOTE — Transfer of Care (Signed)
Immediate Anesthesia Transfer of Care Note  Patient: Robyn [name]  Procedure(s) Performed: Procedure(s): COLONOSCOPY WITH PROPOFOL (N/A)  Patient Location: PACU  Anesthesia Type:MAC  Level of Consciousness:  sedated, patient cooperative and responds to stimulation  Airway & Oxygen Therapy:Patient Spontanous Breathing and Patient connected to face mask oxgen  Post-op Assessment:  Report given to PACU RN and Post -op Vital signs reviewed and stable  Post vital signs:  Reviewed and stable  Last Vitals:  Vitals:   10/26/18 0653  BP: (!) 150/92  Pulse: 76  Resp: 18  Temp: 36.9 C  SpO2: 21%    Complications: No apparent anesthesia complications

## 2018-10-26 NOTE — Anesthesia Preprocedure Evaluation (Signed)
Anesthesia Evaluation  Patient identified by MRN, date of birth, ID band Patient awake    Reviewed: Allergy & Precautions, H&P , NPO status , Patient's Chart, lab work & pertinent test results, reviewed documented beta blocker date and time   Airway Mallampati: II  TM Distance: >3 FB Neck ROM: full    Dental  (+) Chipped, Dental Advisory Given,    Pulmonary neg pulmonary ROS, shortness of breath and with exertion, sleep apnea and Continuous Positive Airway Pressure Ventilation , former smoker,    Pulmonary exam normal breath sounds clear to auscultation       Cardiovascular Exercise Tolerance: Good hypertension, Pt. on home beta blockers and Pt. on medications negative cardio ROS Normal cardiovascular exam Rhythm:regular Rate:Normal     Neuro/Psych PSYCHIATRIC DISORDERS Anxiety Depression negative neurological ROS  negative psych ROS   GI/Hepatic negative GI ROS, Neg liver ROS, GERD  Medicated and Controlled,  Endo/Other  negative endocrine ROSMorbid obesity  Renal/GU negative Renal ROS  negative genitourinary   Musculoskeletal   Abdominal (+) + obese,   Peds  Hematology negative hematology ROS (+)   Anesthesia Other Findings   Reproductive/Obstetrics negative OB ROS                             Anesthesia Physical  Anesthesia Plan  ASA: III  Anesthesia Plan: MAC   Post-op Pain Management:    Induction: Intravenous  PONV Risk Score and Plan: 2 and Ondansetron, Midazolam and Treatment may vary due to age or medical condition  Airway Management Planned: Simple Face Mask  Additional Equipment:   Intra-op Plan:   Post-operative Plan:   Informed Consent: I have reviewed the patients History and Physical, chart, labs and discussed the procedure including the risks, benefits and alternatives for the proposed anesthesia with the patient or authorized representative who has indicated  his/her understanding and acceptance.     Dental Advisory Given  Plan Discussed with: CRNA and Surgeon  Anesthesia Plan Comments:         Anesthesia Quick Evaluation

## 2018-10-26 NOTE — Op Note (Signed)
[facility] Patient Name: Robyn [name] Procedure Date: 10/26/2018 MRN: [id number] Attending MD: Juanita Craver , MD Date of Birth: February 15, 1970 CSN: [id number] Age: 48 Admit Type: Outpatient Procedure:                Screening colonoscopy. Indications:              CRC screening for colorectal malignant neoplasm. Providers:                Juanita Craver, MD, Grace Isaac, RN, Cherylynn Ridges,                            Technician, [employer], CRNA. Referring MD:             Everett Graff MD & Zoe A. Nolon Rod, MD Medicines:                Monitored Anesthesia Care Complications:            No immediate complications. Estimated Blood Loss:     Estimated blood loss: none. Procedure:                Pre-Anesthesia Assessment: - Prior to the                            procedure, a history and physical was performed,                            and patient medications and allergies were                            reviewed. The patient's tolerance of previous                            anesthesia was also reviewed. The risks and                            benefits of the procedure and the sedation options                            and risks were discussed with the patient. All                            questions were answered, and informed consent was                            obtained. Prior Anticoagulants: The patient has                            taken no previous anticoagulant or antiplatelet                            agents. ASA Grade Assessment: III - A patient with                            severe systemic disease. After reviewing the risks  and benefits, the patient was deemed in                            satisfactory condition to undergo the procedure.                            After obtaining informed consent, the colonoscope                            was passed under direct vision. Throughout the                            procedure, the  patient's blood pressure, pulse, and                            oxygen saturations were monitored continuously. The                            CF-HQ190L ([id number] Olympus colonoscope was                            introduced through the anus and advanced to the the                            terminal ileum, with identification of the                            appendiceal orifice and IC valve. The colonoscopy                            was performed without difficulty. The patient                            tolerated the procedure well. The quality of the                            bowel preparation was good. The terminal ileum, the                            ileocecal valve, the appendiceal orifice and the                            rectum were photographed. The bowel preparation                            used was GoLYTELY via split dose instruction. Scope In: 7:22:49 AM Scope Out: 7:41:21 AM Scope Withdrawal Time: 0 hours 14 minutes 22 seconds  Total Procedure Duration: 0 hours 18 minutes 32 seconds  Findings:      The entire examined portion of the colon appeared normal.      The terminal ileum appeared normal.      No additional abnormalities were found on retroflexion. Impression:               - The entire examined colon is  normal.                           - The examined portion of the ileum was normal.                           - No specimens collected. Moderate Sedation:      MAC used. Recommendation:           - High fiber, low fat diet with augmented water                            consumption daily.                           - Continue present medications.                           - Repeat colonoscopy in 10 years for surveillance.                           - Return to GI office PRN.                           - If the patient has any abnormal GI symptoms in                            the interim, she has been advised to call the                            office ASAP for  further recommendations. Procedure Code(s):        --- Professional ---                           [id number], Colonoscopy, flexible; diagnostic, including                            collection of specimen(s) by brushing or washing,                            when performed (separate procedure) Diagnosis Code(s):        --- Professional ---                           Z12.11, Encounter for screening for malignant                            neoplasm of colon CPT copyright 2019 [employer]. All rights reserved. The codes documented in this report are preliminary and upon coder review may  be revised to meet current compliance requirements. Juanita Craver, MD Juanita Craver, MD 10/26/2018 7:50:52 AM This report has been signed electronically. Number of Addenda: 0

## 2018-10-26 NOTE — H&P (Addendum)
Robyn [name] is an 48 y.o. female.   Chief Complaint: Colorectal cancer screening. HPI: Robyn [name] is a 48 year old, morbidly obese black female, who presents with a long hospital today for a colonoscopy.  She has 2 bowel movements per day with no blood or mucus in the stool. She has a good appetite and weights been stable. There is no history of abdominal pain, nausea, vomiting diarrhea constipation. She denies having family history of colon cancer, celiac sprue or IBD.  She has severe sleep apnea with a AHI score of 33.4.  Past Medical History:  Diagnosis Date  . Abnormal Pap smear   . Allergy   . Anxiety    OCCAS PANIC ATTACKS  . Back pain   . Bilateral swelling of feet   . Blood transfusion 1990'S  . Blood transfusion without reported diagnosis   . BV (bacterial vaginosis)   . Cough    nonproductive cough last 2 weeks  . Depression 2010  . Fibroid 2011  . Gallbladder problem   . GERD (gastroesophageal reflux disease)    occasional  . H/O dysmenorrhea   . H/O varicella   . H/O: menorrhagia 2011  . Hypertension   . Hypokalemia    PAST HX  . Hypothyroid   . Increased BMI   . Kidney problem   . Kidney stones   .  Morbid obesity-BM1 49   . Ovarian cyst 2011  . Perimenopausal symptoms 07/15/2010  . Pre-diabetes   . Pregnancy induced hypertension   . Shortness of breath    ONLY WITH ANXIETY  . Sleep apnea-aho 33.4    PT USES CPAP SOMETIMES - SETTING IS 3  . Vitamin D deficiency    Past Surgical History:  Procedure Laterality Date  . C-SECTIONS X 2    . [place]  . CHOLECYSTECTOMY  1994  . NEPHROLITHOTOMY  04/09/2011   Procedure: NEPHROLITHOTOMY PERCUTANEOUS;  Surgeon: Molli Hazard, MD;  Location: [facility];  Service: Urology;  Laterality: Right;      . NEPHROLITHOTOMY  10/15/2011   Procedure: NEPHROLITHOTOMY PERCUTANEOUS;  Surgeon: Molli Hazard, MD;  Location: [facility];  Service: Urology;  Laterality: Left;  . PERCUTANEOUS  NEPHROSTOMY AROUND 1996  may 2013   right kidney  . REMOVAL OF STONES  10/15/2011   Procedure: REMOVAL OF STONES;  Surgeon: Molli Hazard, MD;  Location: [facility];  Service: Urology;  Laterality: Left;  . TUBAL LIGATION  1999  . URETEROSCOPY  04/09/2011   Procedure: URETEROSCOPY;  Surgeon: Molli Hazard, MD;  Location: [facility];  Service: Urology;  Laterality: Right;  . UTERINE ABLATION MARCH 2010  march 2011   Family History  Problem Relation Age of Onset  . Diabetes Mother   . Hypertension Mother   . Obesity Mother   . Hypertension Father   . Diabetes Father   . Kidney disease Father   . Stroke Father   . Sleep apnea Father   . Diabetes Paternal Grandmother   . Diabetes Maternal Grandmother   . Heart disease Maternal Grandmother    Social History:  reports that she has quit smoking. She has a 3.75 pack-year smoking history. She has never used smokeless tobacco. She reports that she does not drink alcohol or use drugs.  Allergies:  Allergies  Allergen Reactions  . Sulfa Antibiotics Hives and Rash    Medications Prior to Admission  Medication Sig Dispense Refill  . acetaminophen (TYLENOL) 500 MG tablet Take  1 tablet (500 mg total) by mouth every 6 (six) hours as needed. (Patient taking differently: Take 500 mg by mouth every 6 (six) hours as needed for moderate pain. ) 90 tablet 0  . allopurinol (ZYLOPRIM) 100 MG tablet Take 100 mg by mouth daily.     Marland Kitchen amLODipine (NORVASC) 5 MG tablet Take 5 mg by mouth at bedtime.    . carvedilol (COREG) 25 MG tablet Take 1 tablet (25 mg total) by mouth 2 (two) times daily with a meal. 30 tablet 0  . chlorthalidone (HYGROTON) 25 MG tablet Take 25 mg by mouth daily.    . colchicine 0.6 MG tablet Take 0.6 mg by mouth daily as needed (gout).     Marland Kitchen levothyroxine (SYNTHROID) 25 MCG tablet Take 1 tablet (25 mcg total) by mouth daily. 90 tablet 3  . lisinopril (PRINIVIL,ZESTRIL) 40 MG tablet Take 40 mg by mouth daily.    . Multiple  Vitamins-Minerals (MULTIVITAMIN WITH MINERALS) tablet Take 1 tablet by mouth daily.    . Semaglutide (RYBELSUS) 7 MG TABS Take 7 mg by mouth daily. 30 tablet 0  . sertraline (ZOLOFT) 25 MG tablet Take 1 tablet (25 mg total) by mouth daily. 30 tablet 0   Review of Systems  Eyes: Negative.   Respiratory: Negative.   Cardiovascular: Negative.   Gastrointestinal: Negative.   Genitourinary: Negative.   Musculoskeletal: Negative.   Neurological: Negative.   Endo/Heme/Allergies: Negative.   Psychiatric/Behavioral: Negative for depression, hallucinations, memory loss, substance abuse and suicidal ideas. The patient is nervous/anxious and has insomnia.    Blood pressure (!) 150/92, pulse 76, temperature 98.4 F (36.9 C), temperature source Oral, resp. rate 18, SpO2 96 %. Physical Exam  Constitutional: She is oriented to person, place, and time. She appears well-developed and well-nourished.  Morbidly obese  HENT:  Head: Normocephalic and atraumatic.  Eyes: Pupils are equal, round, and reactive to light. Conjunctivae and EOM are normal.  Neck: Normal range of motion. Neck supple.  Cardiovascular: Normal rate and regular rhythm.  Respiratory: Effort normal and breath sounds normal.  GI: Soft. Bowel sounds are normal.  Musculoskeletal: Normal range of motion.  Neurological: She is alert and oriented to person, place, and time.  Skin: Skin is dry.  Psychiatric: She has a normal mood and affect. Her behavior is normal. Judgment and thought content normal.    Assessment/Plan Colorectal cancer screening: Proceed with a colonoscopy at this time. Juanita Craver, MD 10/26/2018, 7:07 AM

## 2018-10-26 NOTE — Anesthesia Postprocedure Evaluation (Signed)
Anesthesia Post Note  Patient: Robyn [name]  Procedure(s) Performed: COLONOSCOPY WITH PROPOFOL (N/A )     Patient location during evaluation: Endoscopy Anesthesia Type: MAC Level of consciousness: awake and alert Pain management: pain level controlled Vital Signs Assessment: post-procedure vital signs reviewed and stable Respiratory status: spontaneous breathing, nonlabored ventilation and respiratory function stable Cardiovascular status: stable and blood pressure returned to baseline Postop Assessment: no apparent nausea or vomiting Anesthetic complications: no    Last Vitals:  Vitals:   10/26/18 0800 10/26/18 0805  BP: 130/80   Pulse: 61 62  Resp: 17 16  Temp:    SpO2: 94% 96%    Last Pain:  Vitals:   10/26/18 0800  TempSrc:   PainSc: 0-No pain                 Lynda Rainwater

## 2018-10-26 NOTE — Discharge Instructions (Signed)

## 2018-10-27 ENCOUNTER — Encounter (HOSPITAL_COMMUNITY): Payer: Self-pay | Admitting: Gastroenterology

## 2018-10-27 NOTE — Progress Notes (Signed)
Attempted, unable to leave message 

## 2018-11-03 ENCOUNTER — Other Ambulatory Visit (INDEPENDENT_AMBULATORY_CARE_PROVIDER_SITE_OTHER): Payer: Self-pay | Admitting: Family Medicine

## 2018-11-03 DIAGNOSIS — E1165 Type 2 diabetes mellitus with hyperglycemia: Secondary | ICD-10-CM

## 2018-11-08 ENCOUNTER — Encounter (INDEPENDENT_AMBULATORY_CARE_PROVIDER_SITE_OTHER): Payer: Self-pay | Admitting: Family Medicine

## 2018-11-08 ENCOUNTER — Ambulatory Visit (INDEPENDENT_AMBULATORY_CARE_PROVIDER_SITE_OTHER): Payer: 59 | Admitting: Family Medicine

## 2018-11-08 ENCOUNTER — Other Ambulatory Visit: Payer: Self-pay

## 2018-11-08 VITALS — BP 136/97 | HR 86 | Temp 98.6°F | Ht 64.0 in | Wt 275.0 lb

## 2018-11-08 DIAGNOSIS — Z9189 Other specified personal risk factors, not elsewhere classified: Secondary | ICD-10-CM | POA: Diagnosis not present

## 2018-11-08 DIAGNOSIS — E1121 Type 2 diabetes mellitus with diabetic nephropathy: Secondary | ICD-10-CM

## 2018-11-08 DIAGNOSIS — N1831 Chronic kidney disease, stage 3a: Secondary | ICD-10-CM

## 2018-11-08 DIAGNOSIS — I10 Essential (primary) hypertension: Secondary | ICD-10-CM | POA: Diagnosis not present

## 2018-11-08 DIAGNOSIS — Z6841 Body Mass Index (BMI) 40.0 and over, adult: Secondary | ICD-10-CM

## 2018-11-08 MED ORDER — METFORMIN HCL 500 MG PO TABS: 500.0000 mg | ORAL_TABLET | Freq: Every day | ORAL | 0 refills | Status: DC

## 2018-11-08 MED ORDER — RYBELSUS 7 MG PO TABS: 7.0000 mg | ORAL_TABLET | Freq: Every day | ORAL | 0 refills | Status: DC

## 2018-11-09 ENCOUNTER — Encounter (INDEPENDENT_AMBULATORY_CARE_PROVIDER_SITE_OTHER): Payer: Self-pay | Admitting: Family Medicine

## 2018-11-09 NOTE — Progress Notes (Signed)
Office: [phone]  /  Fax: [phone]   HPI:   Chief Complaint: OBESITY Robyn [name] is here to discuss her progress with her obesity treatment plan. She is on the Category 3 plan and is following her eating plan approximately 50 % of the time. She states she is exercising 0 minutes 0 times per week. Robyn [name] has been very stressed with work and she has been off the plan. She has not been eating all of the food. She has also been eating food not on the plan. Her weight is 275 lb (124.7 kg) today and has had a weight loss of 5 pounds over a period of 2 to 3 weeks since her last visit. She has lost 19 lbs since starting treatment with [place].  Diabetes II with CKD stage III Robyn [name] has a diagnosis of diabetes type II. She is well controlled on Rybelsus. Robyn [name] does not check her CBGs and she admits to rare hypoglycemia. Last A1c was at 6.7 (07/28/18). She has been working on intensive lifestyle modifications including diet, exercise, and weight loss to help control her blood glucose levels.  Hypertension Robyn [name] is a 48 y.o. female with hypertension. Robyn [name]'s blood pressure has not been well controlled for the last two visits. Patient also reports her blood pressure has been elevated at home, with normal systolic and diastolic in the 73'X. Robyn [name] denies chest pain or shortness of breath on exertion. Robyn [name] reports that work is very stressful. She admits to headache. She is working weight loss to help control her blood pressure with the goal of decreasing her risk of heart attack and stroke.  BP Readings from Last 3 Encounters:  11/08/18 (!) 136/97  10/26/18 130/80  10/20/18 (!) 150/94     At risk for cardiovascular disease Robyn [name] is at a higher than average risk for cardiovascular disease due to obesity, diabetes and hypertension. She currently denies any chest pain.  ASSESSMENT AND PLAN:  Type 2 diabetes mellitus with hyperglycemia, without long-term  current use of insulin (HCC) - Plan: Semaglutide (RYBELSUS) 7 MG TABS, metFORMIN (GLUCOPHAGE) 500 MG tablet  Essential hypertension  At risk for heart disease  Class 3 severe obesity with serious comorbidity and body mass index (BMI) of 45.0 to 49.9 in adult, unspecified obesity type ([place])  PLAN:  Diabetes II with CKD stage III Robyn [name] has been given extensive diabetes education by myself today including ideal fasting and post-prandial blood glucose readings, individual ideal Hgb A1c goals and hypoglycemia prevention. We discussed the importance of good blood sugar control to decrease the likelihood of diabetic complications such as nephropathy, neuropathy, limb loss, blindness, coronary artery disease, and death. We discussed the importance of intensive lifestyle modification including diet, exercise and weight loss as the first line treatment for diabetes. Kasyn agrees to continue Rybelsus 7 mg qAM #30 with no refills and start metformin 500 mg daily with breakfast #30 with no refills and follow up at the agreed upon time.  Hypertension We discussed sodium restriction, working on healthy weight loss, and a regular exercise program as the means to achieve improved blood pressure control. Robyn [name] agreed with this plan and agreed to follow up as directed. Robyn [name] will check her blood pressure at home and we will continue to monitor her blood pressure. Robyn [name] will continue all medications as prescribed. .  Cardiovascular risk counseling Robyn [name] was given extended (15 minutes) coronary artery disease prevention counseling today. She is 48 y.o. female and has risk factors for heart disease including  obesity, diabetes and hypertension. We discussed intensive lifestyle modifications today with an emphasis on specific weight loss instructions and strategies. Pt was also informed of the importance of increasing exercise and decreasing saturated fats to help prevent heart  disease.  Obesity Robyn [name] is currently in the action stage of change. As such, her goal is to continue with weight loss efforts She has agreed to follow the Category 3 plan We discussed the following Behavioral Modification Strategies today: increase H2O intake, no skipping meals, increasing lean protein intake and decreasing simple carbohydrates   Robyn [name] has agreed to follow up with our clinic in 2 to 3 weeks. She was informed of the importance of frequent follow up visits to maximize her success with intensive lifestyle modifications for her multiple health conditions.  ALLERGIES: Allergies  Allergen Reactions   Sulfa Antibiotics Hives and Rash    MEDICATIONS: Current Outpatient Medications on File Prior to Visit  Medication Sig Dispense Refill   acetaminophen (TYLENOL) 500 MG tablet Take 1 tablet (500 mg total) by mouth every 6 (six) hours as needed. (Patient taking differently: Take 500 mg by mouth every 6 (six) hours as needed for moderate pain. ) 90 tablet 0   allopurinol (ZYLOPRIM) 100 MG tablet Take 100 mg by mouth daily.      amLODipine (NORVASC) 5 MG tablet Take 5 mg by mouth at bedtime.     carvedilol (COREG) 25 MG tablet Take 1 tablet (25 mg total) by mouth 2 (two) times daily with a meal. 30 tablet 0   chlorthalidone (HYGROTON) 25 MG tablet Take 25 mg by mouth daily.     colchicine 0.6 MG tablet Take 0.6 mg by mouth daily as needed (gout).      levothyroxine (SYNTHROID) 25 MCG tablet Take 1 tablet (25 mcg total) by mouth daily. 90 tablet 3   lisinopril (PRINIVIL,ZESTRIL) 40 MG tablet Take 40 mg by mouth daily.     Multiple Vitamins-Minerals (MULTIVITAMIN WITH MINERALS) tablet Take 1 tablet by mouth daily.     sertraline (ZOLOFT) 25 MG tablet Take 1 tablet (25 mg total) by mouth daily. 30 tablet 0   No current facility-administered medications on file prior to visit.     PAST MEDICAL HISTORY: Past Medical History:  Diagnosis Date   Abnormal Pap smear     Allergy    Anxiety    OCCAS PANIC ATTACKS   Back pain    Bilateral swelling of feet    Blood transfusion 1990'S   Blood transfusion without reported diagnosis    BV (bacterial vaginosis)    Cough    nonproductive cough last 2 weeks   Depression 2010   Fibroid 2011   Gallbladder problem    GERD (gastroesophageal reflux disease)    occasional   H/O dysmenorrhea    H/O varicella    H/O: menorrhagia 2011   Hypertension    Hypokalemia    PAST HX   Hypothyroid    Increased BMI    Kidney problem    Kidney stones    Obesity    OSA (obstructive sleep apnea) 04/22/2013   Ovarian cyst 2011   Perimenopausal symptoms 07/15/2010   Pre-diabetes    Pregnancy induced hypertension    Shortness of breath    ONLY WITH ANXIETY   Sleep apnea    PT USES CPAP SOMETIMES - SETTING IS 3   Vitamin D deficiency    Weight loss 2010    PAST SURGICAL HISTORY: Past Surgical History:  Procedure Laterality Date  C-SECTIONS X 2     CESAREAN SECTION  1993 and [place]   COLONOSCOPY WITH PROPOFOL N/A 10/26/2018   Procedure: COLONOSCOPY WITH PROPOFOL;  Surgeon: Juanita Craver, MD;  Location: [facility];  Service: Endoscopy;  Laterality: N/A;   NEPHROLITHOTOMY  04/09/2011   Procedure: NEPHROLITHOTOMY PERCUTANEOUS;  Surgeon: Molli Hazard, MD;  Location: [facility];  Service: Urology;  Laterality: Right;       NEPHROLITHOTOMY  10/15/2011   Procedure: NEPHROLITHOTOMY PERCUTANEOUS;  Surgeon: Molli Hazard, MD;  Location: [facility];  Service: Urology;  Laterality: Left;   PERCUTANEOUS NEPHROSTOMY AROUND 1996  may 2013   right kidney   REMOVAL OF STONES  10/15/2011   Procedure: REMOVAL OF STONES;  Surgeon: Molli Hazard, MD;  Location: [facility];  Service: Urology;  Laterality: Left;   TUBAL LIGATION  1999   URETEROSCOPY  04/09/2011   Procedure: URETEROSCOPY;  Surgeon: Molli Hazard, MD;  Location: [facility];  Service: Urology;   Laterality: Right;   UTERINE ABLATION MARCH 2010  march 2011    SOCIAL HISTORY: Social History   Tobacco Use   Smoking status: Former Smoker    Packs/day: 0.25    Years: 15.00    Pack years: 3.75   Smokeless tobacco: Never Used  Substance Use Topics   Alcohol use: No   Drug use: No    FAMILY HISTORY: Family History  Problem Relation Age of Onset   Diabetes Mother    Hypertension Mother    Obesity Mother    Hypertension Father    Diabetes Father    Kidney disease Father    Stroke Father    Sleep apnea Father    Diabetes Paternal Grandmother    Diabetes Maternal Grandmother    Heart disease Maternal Grandmother     ROS: Review of Systems  Constitutional: Positive for weight loss.  Respiratory: Negative for shortness of breath (on exertion).   Cardiovascular: Negative for chest pain.  Neurological: Positive for headaches.  Endo/Heme/Allergies:       Positive for hypoglycemia (rare)  Psychiatric/Behavioral:       Positive for Stress    PHYSICAL EXAM: Blood pressure (!) 136/97, pulse 86, temperature 98.6 F (37 C), temperature source Oral, height 5\' 4"  (1.626 m), weight 275 lb (124.7 kg), SpO2 97 %. Body mass index is 47.2 kg/m. Physical Exam Vitals signs reviewed.  Constitutional:      Appearance: Normal appearance. She is well-developed. She is obese.  Cardiovascular:     Rate and Rhythm: Normal rate.  Pulmonary:     Effort: Pulmonary effort is normal.  Musculoskeletal: Normal range of motion.  Skin:    General: Skin is warm and dry.  Neurological:     Mental Status: She is alert and oriented to person, place, and time.  Psychiatric:        Mood and Affect: Mood normal.        Behavior: Behavior normal.     RECENT LABS AND TESTS: BMET    Component Value Date/Time   NA 138 07/28/2018 1452   K 4.4 07/28/2018 1452   CL 99 07/28/2018 1452   CO2 21 07/28/2018 1452   GLUCOSE 127 (H) 07/28/2018 1452   GLUCOSE 98 07/25/2012 1350    BUN 30 (H) 07/28/2018 1452   CREATININE 1.53 (H) 07/28/2018 1452   CREATININE 1.14 (H) 05/31/2012 1653   CALCIUM 9.6 07/28/2018 1452   GFRNONAA 40 (L) 07/28/2018 1452   GFRAA 46 (  L) 07/28/2018 1452   Lab Results  Component Value Date   HGBA1C 6.7 (H) 07/28/2018   HGBA1C 5.3 09/24/2017   HGBA1C 5.1 04/15/2017   HGBA1C 6.0 (H) 01/14/2012   HGBA1C 5.6 12/28/2011   Lab Results  Component Value Date   INSULIN 56.2 (H) 07/28/2018   CBC    Component Value Date/Time   WBC 8.2 05/17/2018 1557   WBC 7.9 09/24/2017 0937   WBC 7.3 07/25/2012 1350   RBC 3.90 05/17/2018 1557   RBC 4.13 09/24/2017 0937   RBC 4.23 07/25/2012 1350   HGB 12.3 05/17/2018 1557   HCT 34.9 05/17/2018 1557   PLT 286 05/17/2018 1557   MCV 90 05/17/2018 1557   MCH 31.5 05/17/2018 1557   MCH 31.3 (A) 09/24/2017 0937   MCH 29.8 07/25/2012 1350   MCHC 35.2 05/17/2018 1557   MCHC 34.0 09/24/2017 0937   MCHC 33.9 07/25/2012 1350   RDW 14.2 05/17/2018 1557   LYMPHSABS 1.9 06/01/2012 1920   MONOABS 0.6 06/01/2012 1920   EOSABS 0.5 06/01/2012 1920   BASOSABS 0.0 06/01/2012 1920   Iron/TIBC/Ferritin/ %Sat No results found for: IRON, TIBC, FERRITIN, IRONPCTSAT Lipid Panel     Component Value Date/Time   CHOL 255 (H) 07/28/2018 1452   TRIG 248 (H) 07/28/2018 1452   HDL 50 07/28/2018 1452   CHOLHDL 4.8 08/20/2010 0453   VLDL 71 (H) 08/20/2010 0453   LDLCALC 155 (H) 07/28/2018 1452   Hepatic Function Panel     Component Value Date/Time   PROT 7.1 07/28/2018 1452   ALBUMIN 4.3 07/28/2018 1452   AST 12 07/28/2018 1452   ALT 27 07/28/2018 1452   ALKPHOS 82 07/28/2018 1452   BILITOT 0.5 07/28/2018 1452      Component Value Date/Time   TSH 1.450 05/17/2018 1557   TSH 2.400 04/15/2017 0932   TSH 1.535 04/12/2014 1228     Ref. Range 05/17/2018 15:57  Vitamin D, 25-Hydroxy Latest Ref Range: 30.0 - 100.0 ng/mL 28.6 (L)    OBESITY BEHAVIORAL INTERVENTION VISIT  Today's visit was # 8   Starting weight:  294 lbs Starting date: 07/28/2018 Today's weight : 275 lbs  Today's date: 11/08/2018 Total lbs lost to date: 19    11/08/2018  Height 5\' 4"  (1.626 m)  Weight 275 lb (124.7 kg)  BMI (Calculated) 47.18  BLOOD PRESSURE - SYSTOLIC 518  BLOOD PRESSURE - DIASTOLIC 97   Body Fat % 84.1 %  Total Body Water (lbs) 89.4 lbs    ASK: We discussed the diagnosis of obesity with Robyn [name] today and Robyn [name] agreed to give [place] permission to discuss obesity behavioral modification therapy today.  ASSESS: Robyn [name] has the diagnosis of obesity and her BMI today is 47.18 Robyn [name] is in the action stage of change   ADVISE: Robyn [name] was educated on the multiple health risks of obesity as well as the benefit of weight loss to improve her health. She was advised of the need for long term treatment and the importance of lifestyle modifications to improve her current health and to decrease her risk of future health problems.  AGREE: Multiple dietary modification options and treatment options were discussed and  Robyn [name] agreed to follow the recommendations documented in the above note.  ARRANGE: Robyn [name] was educated on the importance of frequent visits to treat obesity as outlined per CMS and USPSTF guidelines and agreed to schedule her next follow up appointment today.  IDoreene Nest, am acting as Location manager for [employer],  FNP-C  I have reviewed the above documentation for accuracy and completeness, and I agree with the above.  - Royston Bekele, FNP-C.

## 2018-11-10 ENCOUNTER — Encounter (INDEPENDENT_AMBULATORY_CARE_PROVIDER_SITE_OTHER): Payer: Self-pay | Admitting: Family Medicine

## 2018-11-10 DIAGNOSIS — N1831 Chronic kidney disease, stage 3a: Secondary | ICD-10-CM | POA: Insufficient documentation

## 2018-11-21 ENCOUNTER — Other Ambulatory Visit (INDEPENDENT_AMBULATORY_CARE_PROVIDER_SITE_OTHER): Payer: Self-pay | Admitting: Family Medicine

## 2018-11-21 DIAGNOSIS — F329 Major depressive disorder, single episode, unspecified: Secondary | ICD-10-CM

## 2018-11-21 DIAGNOSIS — F419 Anxiety disorder, unspecified: Secondary | ICD-10-CM

## 2018-11-21 DIAGNOSIS — F32A Depression, unspecified: Secondary | ICD-10-CM

## 2018-11-29 ENCOUNTER — Encounter (INDEPENDENT_AMBULATORY_CARE_PROVIDER_SITE_OTHER): Payer: Self-pay | Admitting: Family Medicine

## 2018-11-29 ENCOUNTER — Ambulatory Visit (INDEPENDENT_AMBULATORY_CARE_PROVIDER_SITE_OTHER): Payer: 59 | Admitting: Family Medicine

## 2018-11-29 ENCOUNTER — Other Ambulatory Visit: Payer: Self-pay

## 2018-11-29 VITALS — BP 130/90 | HR 85 | Temp 98.8°F | Ht 64.0 in | Wt 279.0 lb

## 2018-11-29 DIAGNOSIS — E7849 Other hyperlipidemia: Secondary | ICD-10-CM | POA: Diagnosis not present

## 2018-11-29 DIAGNOSIS — Z6841 Body Mass Index (BMI) 40.0 and over, adult: Secondary | ICD-10-CM

## 2018-11-29 DIAGNOSIS — E1121 Type 2 diabetes mellitus with diabetic nephropathy: Secondary | ICD-10-CM | POA: Diagnosis not present

## 2018-11-29 DIAGNOSIS — E038 Other specified hypothyroidism: Secondary | ICD-10-CM | POA: Diagnosis not present

## 2018-11-29 DIAGNOSIS — Z9189 Other specified personal risk factors, not elsewhere classified: Secondary | ICD-10-CM | POA: Diagnosis not present

## 2018-11-29 DIAGNOSIS — N1831 Chronic kidney disease, stage 3a: Secondary | ICD-10-CM

## 2018-11-29 DIAGNOSIS — E1122 Type 2 diabetes mellitus with diabetic chronic kidney disease: Secondary | ICD-10-CM

## 2018-11-29 DIAGNOSIS — E559 Vitamin D deficiency, unspecified: Secondary | ICD-10-CM

## 2018-11-29 MED ORDER — METFORMIN HCL 500 MG PO TABS: 500.0000 mg | ORAL_TABLET | Freq: Two times a day (BID) | ORAL | 0 refills | Status: DC

## 2018-11-30 LAB — COMPREHENSIVE METABOLIC PANEL
ALT: 20 IU/L (ref 0–32)
AST: 15 IU/L (ref 0–40)
Albumin/Globulin Ratio: 1.8 (ref 1.2–2.2)
Albumin: 4.2 g/dL (ref 3.8–4.8)
Alkaline Phosphatase: 89 IU/L (ref 39–117)
BUN/Creatinine Ratio: 12 (ref 9–23)
BUN: 17 mg/dL (ref 6–24)
Bilirubin Total: 0.8 mg/dL (ref 0.0–1.2)
CO2: 26 mmol/L (ref 20–29)
Calcium: 9.6 mg/dL (ref 8.7–10.2)
Chloride: 97 mmol/L (ref 96–106)
Creatinine, Ser: 1.42 mg/dL — ABNORMAL HIGH (ref 0.57–1.00)
GFR calc Af Amer: 51 mL/min/{1.73_m2} — ABNORMAL LOW (ref >59)
GFR calc non Af Amer: 44 mL/min/{1.73_m2} — ABNORMAL LOW (ref >59)
Globulin, Total: 2.4 g/dL (ref 1.5–4.5)
Glucose: 110 mg/dL — ABNORMAL HIGH (ref 65–99)
Potassium: 3.5 mmol/L (ref 3.5–5.2)
Sodium: 138 mmol/L (ref 134–144)
Total Protein: 6.6 g/dL (ref 6.0–8.5)

## 2018-11-30 LAB — HEMOGLOBIN A1C
Est. average glucose Bld gHb Est-mCnc: 117 mg/dL
Hgb A1c MFr Bld: 5.7 % — ABNORMAL HIGH (ref 4.8–5.6)

## 2018-11-30 LAB — TSH: TSH: 3.25 u[IU]/mL (ref 0.450–4.500)

## 2018-11-30 LAB — LIPID PANEL WITH LDL/HDL RATIO
Cholesterol, Total: 220 mg/dL — ABNORMAL HIGH (ref 100–199)
HDL: 46 mg/dL (ref >39)
LDL Chol Calc (NIH): 129 mg/dL — ABNORMAL HIGH (ref 0–99)
LDL/HDL Ratio: 2.8 ratio (ref 0.0–3.2)
Triglycerides: 253 mg/dL — ABNORMAL HIGH (ref 0–149)
VLDL Cholesterol Cal: 45 mg/dL — ABNORMAL HIGH (ref 5–40)

## 2018-11-30 LAB — INSULIN, RANDOM: INSULIN: 25.4 u[IU]/mL — ABNORMAL HIGH (ref 2.6–24.9)

## 2018-11-30 LAB — T3: T3, Total: 133 ng/dL (ref 71–180)

## 2018-11-30 LAB — T4, FREE: Free T4: 1.27 ng/dL (ref 0.82–1.77)

## 2018-11-30 LAB — VITAMIN D 25 HYDROXY (VIT D DEFICIENCY, FRACTURES): Vit D, 25-Hydroxy: 28.5 ng/mL — ABNORMAL LOW (ref 30.0–100.0)

## 2018-11-30 NOTE — Progress Notes (Signed)
Office: [phone]  /  Fax: [phone]   HPI:   Chief Complaint: OBESITY Robyn [name] is here to discuss her progress with her obesity treatment plan. She is on the Category 3 plan and is following her eating plan approximately 70 % of the time. She states she is exercising 0 minutes 0 times per week. Robyn [name] is very off track with the meal plan and she reports that she cannot seem to get back on track. She has been eating all of the food on the plan but also eats additional off plan foods. She has increased her carb intake. Her weight is 279 lb (126.6 kg) today and has had a weight gain of 4 pounds over a period of 3 weeks since her last visit. She has lost 15 lbs since starting treatment with [place].  Vitamin D deficiency Robyn [name] has a diagnosis of vitamin D deficiency. It is not yet at goal. Last vit D was 28.5 on 11/29/18.  She has been on OTC vitamin D 5,000 units daily but she has not been taking it consistently. Robyn [name] admits fatigue and she denies nausea, vomiting or muscle weakness.  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 Robyn [name] does not check her blood sugars and she denies any hypoglycemic episodes. She has been working on intensive lifestyle modifications including diet, exercise, and weight loss to help control her blood glucose levels.  Hyperlipidemia Robyn [name] has hyperlipidemia and she is not on statin though she is diabetic. Her last LDL was at 155 (07/28/18). She has been trying to improve her cholesterol levels with intensive lifestyle modification including a low saturated fat diet, exercise and weight loss. She denies any chest pain or shortness of breath. The 10-year ASCVD risk score Robyn [name] DC Robyn Bonito., et al., 2013) is: 10%   Values used to calculate the score:     Age: 4 years     Sex: Female     Is Non-Hispanic African American: Yes     Diabetic: Yes  Tobacco smoker: No     Systolic Blood Pressure: 419 mmHg     Is BP treated: Yes     HDL Cholesterol: 46 mg/dL     Total Cholesterol: 220 mg/dL   Hypothyroidism Robyn [name] has a diagnosis of hypothyroidism. She is stable on 25 mcg of Synthroid. She denies hot or cold intolerance or palpitations.  At risk for cardiovascular disease Robyn [name] is at a higher than average risk for cardiovascular disease due to obesity, diabetes, hyperlipidemia and hypothyroidism. She currently denies any chest pain.  ASSESSMENT AND PLAN:  Type 2 diabetes mellitus with stage 3a chronic kidney disease, without long-term current use of insulin ([place]) - Plan: Comprehensive metabolic panel, Hemoglobin A1c, Insulin, random, metFORMIN (GLUCOPHAGE) 500 MG tablet  Vitamin D deficiency - Plan: Vitamin D (25 hydroxy)  Other hyperlipidemia - Plan: Lipid Panel With LDL/HDL Ratio, T3, T4, free, TSH  Other specified hypothyroidism  At risk for heart disease  Class 3 severe obesity with serious comorbidity and body mass index (BMI) of 45.0 to 49.9 in adult, unspecified obesity type ([place])  PLAN:  Vitamin D Deficiency Robyn [name] was informed that low vitamin D levels contributes to fatigue and are associated with obesity, breast, and colon cancer. Rena will continue to take OTC Vit D @5 ,000 IU daily and she will follow up for routine testing of vitamin D, at least 2-3 times per year. She was informed of  the risk of over-replacement of vitamin D and agrees to not increase her dose unless she discusses this with [place] first. We will check vitamin D level today and [name] agrees to follow up as directed.  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 Robyn [name] agrees to continue metformin 500 mg BID #60 with no refills. We will check A1c, fasting insulin and glucose today. Robyn [name] will continue Rybelsus and she agrees to follow up at the agreed upon time.  Hyperlipidemia  We discussed starting statin and she  seemed amenable. We will start statin at the next visit pending FLP results. We will check fasting lipid panel today.  Hypothyroidism Robyn [name] was informed of the importance of good thyroid control to help with weight loss efforts. She was also informed that supertherapeutic thyroid levels are dangerous and will not improve weight loss results. We will check thyroid panel today and Robyn [name] will continue Synthroid 25 mcg daily.  Cardiovascular risk counseling Robyn [name] was given extended (15 minutes) coronary artery disease prevention counseling today. She is 48 y.o. female and has risk factors for heart disease including obesity, diabetes, hyperlipidemia and hypothyroidism. We discussed intensive lifestyle modifications today with an emphasis on specific weight loss instructions and strategies. Pt was also informed of the importance of increasing exercise and decreasing saturated fats to help prevent heart disease.  Obesity Robyn [name] is currently in the action stage of change. As such, her goal is to continue with weight loss efforts She has agreed to follow the Category 3 plan Robyn [name] has been instructed to work up to a goal of 150 minutes of combined cardio and strengthening exercise per week for weight loss and overall health benefits. We discussed the following Behavioral Modification Strategies today: planning for success, increasing lean protein intake, decreasing simple carbohydrates, work on meal planning and easy cooking plans and holiday eating strategies  Handout for holiday recipes was given to patient today.  Robyn [name] has agreed to follow up with our clinic in 3 weeks. She was informed of the importance of frequent follow up visits to maximize her success with intensive lifestyle modifications for her multiple health conditions.  ALLERGIES: Allergies  Allergen Reactions  . Sulfa Antibiotics Hives and Rash    MEDICATIONS: Current Outpatient Medications on File Prior to  Visit  Medication Sig Dispense Refill  . acetaminophen (TYLENOL) 500 MG tablet Take 1 tablet (500 mg total) by mouth every 6 (six) hours as needed. (Patient taking differently: Take 500 mg by mouth every 6 (six) hours as needed for moderate pain. ) 90 tablet 0  . allopurinol (ZYLOPRIM) 100 MG tablet Take 100 mg by mouth daily.     Marland Kitchen amLODipine (NORVASC) 5 MG tablet Take 5 mg by mouth at bedtime.    . carvedilol (COREG) 25 MG tablet Take 1 tablet (25 mg total) by mouth 2 (two) times daily with a meal. 30 tablet 0  . chlorthalidone (HYGROTON) 25 MG tablet Take 25 mg by mouth daily.    . colchicine 0.6 MG tablet Take 0.6 mg by mouth daily as needed (gout).     Marland Kitchen levothyroxine (SYNTHROID) 25 MCG tablet Take 1 tablet (25 mcg total) by mouth daily. 90 tablet 3  . lisinopril (PRINIVIL,ZESTRIL) 40 MG tablet Take 40 mg by mouth daily.    . Multiple Vitamins-Minerals (MULTIVITAMIN WITH MINERALS) tablet Take 1 tablet by mouth daily.    . Semaglutide (RYBELSUS) 7 MG TABS Take 7 mg by mouth daily. 30 tablet 0  . sertraline (ZOLOFT) 25  MG tablet TAKE 1 TABLET BY MOUTH EVERY DAY 30 tablet 0   No current facility-administered medications on file prior to visit.     PAST MEDICAL HISTORY: Past Medical History:  Diagnosis Date  . Abnormal Pap smear   . Allergy   . Anxiety    OCCAS PANIC ATTACKS  . Back pain   . Bilateral swelling of feet   . Blood transfusion 1990'S  . Blood transfusion without reported diagnosis   . BV (bacterial vaginosis)   . Cough    nonproductive cough last 2 weeks  . Depression 2010  . Fibroid 2011  . Gallbladder problem   . GERD (gastroesophageal reflux disease)    occasional  . H/O dysmenorrhea   . H/O varicella   . H/O: menorrhagia 2011  . Hypertension   . Hypokalemia    PAST HX  . Hypothyroid   . Increased BMI   . Kidney problem   . Kidney stones   . Obesity   . OSA (obstructive sleep apnea) 04/22/2013  . Ovarian cyst 2011  . Perimenopausal symptoms 07/15/2010   . Pre-diabetes   . Pregnancy induced hypertension   . Shortness of breath    ONLY WITH ANXIETY  . Sleep apnea    PT USES CPAP SOMETIMES - SETTING IS 3  . Vitamin D deficiency   . Weight loss 2010    PAST SURGICAL HISTORY: Past Surgical History:  Procedure Laterality Date  . C-SECTIONS X 2    . [place]  . CHOLECYSTECTOMY  1994  . COLONOSCOPY WITH PROPOFOL N/A 10/26/2018   Procedure: COLONOSCOPY WITH PROPOFOL;  Surgeon: Juanita Craver, MD;  Location: [facility];  Service: Endoscopy;  Laterality: N/A;  . NEPHROLITHOTOMY  04/09/2011   Procedure: NEPHROLITHOTOMY PERCUTANEOUS;  Surgeon: Molli Hazard, MD;  Location: [facility];  Service: Urology;  Laterality: Right;      . NEPHROLITHOTOMY  10/15/2011   Procedure: NEPHROLITHOTOMY PERCUTANEOUS;  Surgeon: Molli Hazard, MD;  Location: [facility];  Service: Urology;  Laterality: Left;  . PERCUTANEOUS NEPHROSTOMY AROUND 1996  may 2013   right kidney  . REMOVAL OF STONES  10/15/2011   Procedure: REMOVAL OF STONES;  Surgeon: Molli Hazard, MD;  Location: [facility];  Service: Urology;  Laterality: Left;  . TUBAL LIGATION  1999  . URETEROSCOPY  04/09/2011   Procedure: URETEROSCOPY;  Surgeon: Molli Hazard, MD;  Location: [facility];  Service: Urology;  Laterality: Right;  . UTERINE ABLATION MARCH 2010  march 2011    SOCIAL HISTORY: Social History   Tobacco Use  . Smoking status: Former Smoker    Packs/day: 0.25    Years: 15.00    Pack years: 3.75  . Smokeless tobacco: Never Used  Substance Use Topics  . Alcohol use: No  . Drug use: No    FAMILY HISTORY: Family History  Problem Relation Age of Onset  . Diabetes Mother   . Hypertension Mother   . Obesity Mother   . Hypertension Father   . Diabetes Father   . Kidney disease Father   . Stroke Father   . Sleep apnea Father   . Diabetes Paternal Grandmother   . Diabetes Maternal Grandmother   . Heart disease Maternal Grandmother     ROS:  Review of Systems  Constitutional: Positive for malaise/fatigue. Negative for weight loss.  Respiratory: Negative for shortness of breath.   Cardiovascular: Negative for chest pain and palpitations.  Gastrointestinal: Negative for nausea and  vomiting.  Musculoskeletal:       Negative for muscle weakness  Endo/Heme/Allergies:       Negative for hypoglycemia Negative for heat or cold intolerance    PHYSICAL EXAM: Blood pressure 130/90, pulse 85, temperature 98.8 F (37.1 C), temperature source Oral, height 5\' 4"  (1.626 m), weight 279 lb (126.6 kg), SpO2 97 %. Body mass index is 47.89 kg/m. Physical Exam Vitals signs reviewed.  Constitutional:      Appearance: Normal appearance. She is well-developed. She is obese.  Cardiovascular:     Rate and Rhythm: Normal rate.  Pulmonary:     Effort: Pulmonary effort is normal.  Musculoskeletal: Normal range of motion.  Skin:    General: Skin is warm and dry.  Neurological:     Mental Status: She is alert and oriented to person, place, and time.  Psychiatric:        Mood and Affect: Mood normal.        Behavior: Behavior normal.     RECENT LABS AND TESTS: BMET    Component Value Date/Time   NA 138 11/29/2018 0815   K 3.5 11/29/2018 0815   CL 97 11/29/2018 0815   CO2 26 11/29/2018 0815   GLUCOSE 110 (H) 11/29/2018 0815   GLUCOSE 98 07/25/2012 1350   BUN 17 11/29/2018 0815   CREATININE 1.42 (H) 11/29/2018 0815   CREATININE 1.14 (H) 05/31/2012 1653   CALCIUM 9.6 11/29/2018 0815   GFRNONAA 44 (L) 11/29/2018 0815   GFRAA 51 (L) 11/29/2018 0815   Lab Results  Component Value Date   HGBA1C 5.7 (H) 11/29/2018   HGBA1C 6.7 (H) 07/28/2018   HGBA1C 5.3 09/24/2017   HGBA1C 5.1 04/15/2017   HGBA1C 6.0 (H) 01/14/2012   Lab Results  Component Value Date   INSULIN WILL FOLLOW 11/29/2018   INSULIN 56.2 (H) 07/28/2018   CBC    Component Value Date/Time   WBC 8.2 05/17/2018 1557   WBC 7.9 09/24/2017 0937   WBC 7.3 07/25/2012  1350   RBC 3.90 05/17/2018 1557   RBC 4.13 09/24/2017 0937   RBC 4.23 07/25/2012 1350   HGB 12.3 05/17/2018 1557   HCT 34.9 05/17/2018 1557   PLT 286 05/17/2018 1557   MCV 90 05/17/2018 1557   MCH 31.5 05/17/2018 1557   MCH 31.3 (A) 09/24/2017 0937   MCH 29.8 07/25/2012 1350   MCHC 35.2 05/17/2018 1557   MCHC 34.0 09/24/2017 0937   MCHC 33.9 07/25/2012 1350   RDW 14.2 05/17/2018 1557   LYMPHSABS 1.9 06/01/2012 1920   MONOABS 0.6 06/01/2012 1920   EOSABS 0.5 06/01/2012 1920   BASOSABS 0.0 06/01/2012 1920   Iron/TIBC/Ferritin/ %Sat No results found for: IRON, TIBC, FERRITIN, IRONPCTSAT Lipid Panel     Component Value Date/Time   CHOL 220 (H) 11/29/2018 0815   TRIG 253 (H) 11/29/2018 0815   HDL 46 11/29/2018 0815   CHOLHDL 4.8 08/20/2010 0453   VLDL 71 (H) 08/20/2010 0453   LDLCALC 129 (H) 11/29/2018 0815   Hepatic Function Panel     Component Value Date/Time   PROT 6.6 11/29/2018 0815   ALBUMIN 4.2 11/29/2018 0815   AST 15 11/29/2018 0815   ALT 20 11/29/2018 0815   ALKPHOS 89 11/29/2018 0815   BILITOT 0.8 11/29/2018 0815      Component Value Date/Time   TSH 3.250 11/29/2018 0815   TSH 1.450 05/17/2018 1557   TSH 2.400 04/15/2017 0932     Ref. Range 11/29/2018 08:15  Vitamin D,  25-Hydroxy Latest Ref Range: 30.0 - 100.0 ng/mL 28.5 (L)    OBESITY BEHAVIORAL INTERVENTION VISIT  Today's visit was # 9   Starting weight: 294 lbs Starting date: 07/28/2018 Today's weight : 279 lbs Today's date: 11/29/2018 Total lbs lost to date: 15    11/29/2018  Height 5\' 4"  (1.626 m)  Weight 279 lb (126.6 kg)  BMI (Calculated) 47.87  BLOOD PRESSURE - SYSTOLIC 462  BLOOD PRESSURE - DIASTOLIC 90   Body Fat % 50 %  Total Body Water (lbs) 90.6 lbs    ASK: We discussed the diagnosis of obesity with Robyn [name] today and Robyn [name] agreed to give [place] permission to discuss obesity behavioral modification therapy today.  ASSESS: Neliah has the diagnosis of obesity and  her BMI today is 47.87 Vonnetta is in the action stage of change   ADVISE: Cecila was educated on the multiple health risks of obesity as well as the benefit of weight loss to improve her health. She was advised of the need for long term treatment and the importance of lifestyle modifications to improve her current health and to decrease her risk of future health problems.  AGREE: Multiple dietary modification options and treatment options were discussed and  Robyn [name] agreed to follow the recommendations documented in the above note.  ARRANGE: Robyn [name] was educated on the importance of frequent visits to treat obesity as outlined per CMS and USPSTF guidelines and agreed to schedule her next follow up appointment today.  I, Robyn [name], am acting as transcriptionist for [employer], FNP-C  I have reviewed the above documentation for accuracy and completeness, and I agree with the above.  - Dalayla Aldredge, FNP-C.

## 2018-12-06 ENCOUNTER — Encounter (INDEPENDENT_AMBULATORY_CARE_PROVIDER_SITE_OTHER): Payer: Self-pay | Admitting: Family Medicine

## 2018-12-06 DIAGNOSIS — E7849 Other hyperlipidemia: Secondary | ICD-10-CM | POA: Insufficient documentation

## 2018-12-06 DIAGNOSIS — E559 Vitamin D deficiency, unspecified: Secondary | ICD-10-CM | POA: Insufficient documentation

## 2018-12-21 ENCOUNTER — Ambulatory Visit (INDEPENDENT_AMBULATORY_CARE_PROVIDER_SITE_OTHER): Payer: 59 | Admitting: Family Medicine

## 2018-12-21 ENCOUNTER — Other Ambulatory Visit: Payer: Self-pay

## 2018-12-21 ENCOUNTER — Encounter (INDEPENDENT_AMBULATORY_CARE_PROVIDER_SITE_OTHER): Payer: Self-pay | Admitting: Family Medicine

## 2018-12-21 VITALS — BP 166/105 | HR 78 | Temp 98.0°F | Ht 64.0 in | Wt 280.0 lb

## 2018-12-21 DIAGNOSIS — E1121 Type 2 diabetes mellitus with diabetic nephropathy: Secondary | ICD-10-CM

## 2018-12-21 DIAGNOSIS — I10 Essential (primary) hypertension: Secondary | ICD-10-CM | POA: Diagnosis not present

## 2018-12-21 DIAGNOSIS — Z9189 Other specified personal risk factors, not elsewhere classified: Secondary | ICD-10-CM

## 2018-12-21 DIAGNOSIS — E1122 Type 2 diabetes mellitus with diabetic chronic kidney disease: Secondary | ICD-10-CM

## 2018-12-21 DIAGNOSIS — Z6841 Body Mass Index (BMI) 40.0 and over, adult: Secondary | ICD-10-CM

## 2018-12-21 DIAGNOSIS — N1831 Chronic kidney disease, stage 3a: Secondary | ICD-10-CM

## 2018-12-21 MED ORDER — METFORMIN HCL 500 MG PO TABS: 500.0000 mg | ORAL_TABLET | Freq: Two times a day (BID) | ORAL | 0 refills | Status: DC

## 2018-12-21 MED ORDER — RYBELSUS 7 MG PO TABS: 7.0000 mg | ORAL_TABLET | Freq: Every day | ORAL | 0 refills | Status: DC

## 2018-12-21 NOTE — Progress Notes (Signed)
Office: [phone]  /  Fax: [phone]   HPI:  Chief Complaint: OBESITY Robyn [name] is here to discuss her progress with her obesity treatment plan. She is on the Category 3 plan and states she is following her eating plan approximately 50% of the time. She states she is exercising 0 minutes 0 times per week.  Robyn [name] has done well minimizing weight gain over the holidays. Her stress is very high with both her father and 47 year old son in ICU for COVID-19. They both live 4 hours away and she has not had any contact recently. She understandably has not been concentrating on weight loss, but has done well with PC. She is also concerned about how to handle a religious fast in her church in January.  Today's visit was #10 Starting weight: 294 lbs Starting date: 07/28/2018 Today's weight: 280 lbs Today's date: 12/21/2018 Total lbs lost to date: 14  Total lbs lost since last in-office visit: 0   Diabetes Mellitus type II Robyn [name]'s last A1c improved from 6.7 on 07/28/2018 to 5.7 on 11/29/2018 on diet and medications. We discussed lab results with her today. No nausea or vomiting.  Hypertension Robyn [name] has a diagnosis of hypertension. Her blood pressure is elevated today at 166/105, which she states is due to family stress. Her blood pressure has been better than this in the recent past. Lab results were reviewed with her today. She denies chest pain.  At risk for cardiovascular disease Robyn [name] is at a higher than average risk for cardiovascular disease due to obesity. She currently denies any chest pain.  ASSESSMENT AND PLAN:  Type 2 diabetes mellitus with stage 3a chronic kidney disease, without long-term current use of insulin ([place]) - Plan: metFORMIN (GLUCOPHAGE) 500 MG tablet, Semaglutide (RYBELSUS) 7 MG TABS  Essential hypertension  At risk for heart disease  Class 3 severe obesity with serious comorbidity and body mass index (BMI) of 45.0 to 49.9 in adult, unspecified  obesity type ([place])  PLAN:  Diabetes II Robyn [name] has been given diabetes education by myself today. Good blood sugar control is important to decrease the likelihood of diabetic complications such as nephropathy, neuropathy, limb loss, blindness, coronary artery disease, and death. Intensive lifestyle modification including diet, exercise and weight loss were discussed as the first line treatment for diabetes. Robyn [name] was given refills on her metformin and Rybelsus x1 month. She agrees to follow-up with our clinic in 4 weeks.  Hypertension Robyn [name] is working on healthy weight loss and exercise to improve blood pressure control. She is to continue with diet and exercise. If blood pressure is elevated at her next visit in 4 weeks, will adjust medications or she can follow-up with her PCP. We will watch for signs of hypotension as she continues her lifestyle modifications.  Cardiovascular risk counseling Robyn [name] was given (~15 minutes) coronary artery disease prevention counseling today. She is 48 y.o. female and has risk factors for heart disease including obesity. We discussed intensive lifestyle modifications today with an emphasis on specific weight loss instructions and strategies.   Obesity Robyn [name] is currently in the action stage of change. As such, her goal is to continue with weight loss efforts. She has agreed to follow the Category 3 plan. Robyn [name] was given the modified Milus Height and the vegetarian plan handout was also given. Robyn [name] has been instructed to work up to a goal of 150 minutes of combined cardio and strengthening exercise per week for weight loss and overall health benefits. We discussed the following Behavioral  Modification Strategies today: increasing lean protein intake and no skipping meals.  Robyn [name] has agreed to follow-up with our clinic in 4 weeks. She was informed of the importance of frequent follow-up visits to maximize her success with  intensive lifestyle modifications for her multiple health conditions.  ALLERGIES: Allergies  Allergen Reactions  . Sulfa Antibiotics Hives and Rash    MEDICATIONS: Current Outpatient Medications on File Prior to Visit  Medication Sig Dispense Refill  . acetaminophen (TYLENOL) 500 MG tablet Take 1 tablet (500 mg total) by mouth every 6 (six) hours as needed. (Patient taking differently: Take 500 mg by mouth every 6 (six) hours as needed for moderate pain. ) 90 tablet 0  . allopurinol (ZYLOPRIM) 100 MG tablet Take 100 mg by mouth daily.     Marland Kitchen amLODipine (NORVASC) 5 MG tablet Take 5 mg by mouth at bedtime.    . carvedilol (COREG) 25 MG tablet Take 1 tablet (25 mg total) by mouth 2 (two) times daily with a meal. 30 tablet 0  . chlorthalidone (HYGROTON) 25 MG tablet Take 25 mg by mouth daily.    . colchicine 0.6 MG tablet Take 0.6 mg by mouth daily as needed (gout).     Marland Kitchen levothyroxine (SYNTHROID) 25 MCG tablet Take 1 tablet (25 mcg total) by mouth daily. 90 tablet 3  . lisinopril (PRINIVIL,ZESTRIL) 40 MG tablet Take 40 mg by mouth daily.    . Multiple Vitamins-Minerals (MULTIVITAMIN WITH MINERALS) tablet Take 1 tablet by mouth daily.    . sertraline (ZOLOFT) 25 MG tablet TAKE 1 TABLET BY MOUTH EVERY DAY 30 tablet 0   No current facility-administered medications on file prior to visit.    PAST MEDICAL HISTORY: Past Medical History:  Diagnosis Date  . Abnormal Pap smear   . Allergy   . Anxiety    OCCAS PANIC ATTACKS  . Back pain   . Bilateral swelling of feet   . Blood transfusion 1990'S  . Blood transfusion without reported diagnosis   . BV (bacterial vaginosis)   . Cough    nonproductive cough last 2 weeks  . Depression 2010  . Fibroid 2011  . Gallbladder problem   . GERD (gastroesophageal reflux disease)    occasional  . H/O dysmenorrhea   . H/O varicella   . H/O: menorrhagia 2011  . Hypertension   . Hypokalemia    PAST HX  . Hypothyroid   . Increased BMI   . Kidney  problem   . Kidney stones   . Obesity   . OSA (obstructive sleep apnea) 04/22/2013  . Ovarian cyst 2011  . Perimenopausal symptoms 07/15/2010  . Pre-diabetes   . Pregnancy induced hypertension   . Shortness of breath    ONLY WITH ANXIETY  . Sleep apnea    PT USES CPAP SOMETIMES - SETTING IS 3  . Vitamin D deficiency   . Weight loss 2010    PAST SURGICAL HISTORY: Past Surgical History:  Procedure Laterality Date  . C-SECTIONS X 2    . [place]  . CHOLECYSTECTOMY  1994  . COLONOSCOPY WITH PROPOFOL N/A 10/26/2018   Procedure: COLONOSCOPY WITH PROPOFOL;  Surgeon: Juanita Craver, MD;  Location: [facility];  Service: Endoscopy;  Laterality: N/A;  . NEPHROLITHOTOMY  04/09/2011   Procedure: NEPHROLITHOTOMY PERCUTANEOUS;  Surgeon: Molli Hazard, MD;  Location: [facility];  Service: Urology;  Laterality: Right;      . NEPHROLITHOTOMY  10/15/2011   Procedure: NEPHROLITHOTOMY PERCUTANEOUS;  Surgeon: Molli Hazard, MD;  Location: [facility];  Service: Urology;  Laterality: Left;  . PERCUTANEOUS NEPHROSTOMY AROUND 1996  may 2013   right kidney  . REMOVAL OF STONES  10/15/2011   Procedure: REMOVAL OF STONES;  Surgeon: Molli Hazard, MD;  Location: [facility];  Service: Urology;  Laterality: Left;  . TUBAL LIGATION  1999  . URETEROSCOPY  04/09/2011   Procedure: URETEROSCOPY;  Surgeon: Molli Hazard, MD;  Location: [facility];  Service: Urology;  Laterality: Right;  . UTERINE ABLATION MARCH 2010  march 2011    SOCIAL HISTORY: Social History   Tobacco Use  . Smoking status: Former Smoker    Packs/day: 0.25    Years: 15.00    Pack years: 3.75  . Smokeless tobacco: Never Used  Substance Use Topics  . Alcohol use: No  . Drug use: No    FAMILY HISTORY: Family History  Problem Relation Age of Onset  . Diabetes Mother   . Hypertension Mother   . Obesity Mother   . Hypertension Father   . Diabetes Father   . Kidney disease Father   . Stroke Father     . Sleep apnea Father   . Diabetes Paternal Grandmother   . Diabetes Maternal Grandmother   . Heart disease Maternal Grandmother    ROS: Review of Systems  Cardiovascular: Negative for chest pain.  Gastrointestinal: Negative for nausea and vomiting.   PHYSICAL EXAM: Blood pressure (!) 166/105, pulse 78, temperature 98 F (36.7 C), temperature source Oral, height 5\' 4"  (1.626 m), weight 280 lb (127 kg), SpO2 95 %. Body mass index is 48.06 kg/m. Physical Exam Vitals reviewed.  Constitutional:      Appearance: Normal appearance. She is obese.  Cardiovascular:     Rate and Rhythm: Normal rate.     Pulses: Normal pulses.  Pulmonary:     Effort: Pulmonary effort is normal.     Breath sounds: Normal breath sounds.  Musculoskeletal:        General: Normal range of motion.  Skin:    General: Skin is warm and dry.  Neurological:     Mental Status: She is alert and oriented to person, place, and time.  Psychiatric:        Behavior: Behavior normal.   RECENT LABS AND TESTS: BMET    Component Value Date/Time   NA 138 11/29/2018 0815   K 3.5 11/29/2018 0815   CL 97 11/29/2018 0815   CO2 26 11/29/2018 0815   GLUCOSE 110 (H) 11/29/2018 0815   GLUCOSE 98 07/25/2012 1350   BUN 17 11/29/2018 0815   CREATININE 1.42 (H) 11/29/2018 0815   CREATININE 1.14 (H) 05/31/2012 1653   CALCIUM 9.6 11/29/2018 0815   GFRNONAA 44 (L) 11/29/2018 0815   GFRAA 51 (L) 11/29/2018 0815   Lab Results  Component Value Date   HGBA1C 5.7 (H) 11/29/2018   HGBA1C 6.7 (H) 07/28/2018   HGBA1C 5.3 09/24/2017   HGBA1C 5.1 04/15/2017   HGBA1C 6.0 (H) 01/14/2012   Lab Results  Component Value Date   INSULIN 25.4 (H) 11/29/2018   INSULIN 56.2 (H) 07/28/2018   CBC    Component Value Date/Time   WBC 8.2 05/17/2018 1557   WBC 7.9 09/24/2017 0937   WBC 7.3 07/25/2012 1350   RBC 3.90 05/17/2018 1557   RBC 4.13 09/24/2017 0937   RBC 4.23 07/25/2012 1350   HGB 12.3 05/17/2018 1557   HCT 34.9 05/17/2018  1557   PLT 286  05/17/2018 1557   MCV 90 05/17/2018 1557   MCH 31.5 05/17/2018 1557   MCH 31.3 (A) 09/24/2017 0937   MCH 29.8 07/25/2012 1350   MCHC 35.2 05/17/2018 1557   MCHC 34.0 09/24/2017 0937   MCHC 33.9 07/25/2012 1350   RDW 14.2 05/17/2018 1557   LYMPHSABS 1.9 06/01/2012 1920   MONOABS 0.6 06/01/2012 1920   EOSABS 0.5 06/01/2012 1920   BASOSABS 0.0 06/01/2012 1920   Iron/TIBC/Ferritin/ %Sat No results found for: IRON, TIBC, FERRITIN, IRONPCTSAT Lipid Panel     Component Value Date/Time   CHOL 220 (H) 11/29/2018 0815   TRIG 253 (H) 11/29/2018 0815   HDL 46 11/29/2018 0815   CHOLHDL 4.8 08/20/2010 0453   VLDL 71 (H) 08/20/2010 0453   LDLCALC 129 (H) 11/29/2018 0815   Hepatic Function Panel     Component Value Date/Time   PROT 6.6 11/29/2018 0815   ALBUMIN 4.2 11/29/2018 0815   AST 15 11/29/2018 0815   ALT 20 11/29/2018 0815   ALKPHOS 89 11/29/2018 0815   BILITOT 0.8 11/29/2018 0815      Component Value Date/Time   TSH 3.250 11/29/2018 0815   TSH 1.450 05/17/2018 1557   TSH 2.400 04/15/2017 0932      I, Michaelene Song, am acting as Location manager for Dennard Nip, MD I have reviewed the above documentation for accuracy and completeness, and I agree with the above. -Dennard Nip, MD

## 2019-01-17 ENCOUNTER — Ambulatory Visit (INDEPENDENT_AMBULATORY_CARE_PROVIDER_SITE_OTHER): Payer: 59 | Admitting: Family Medicine

## 2019-01-25 ENCOUNTER — Encounter (INDEPENDENT_AMBULATORY_CARE_PROVIDER_SITE_OTHER): Payer: Self-pay | Admitting: Family Medicine

## 2019-01-25 ENCOUNTER — Ambulatory Visit (INDEPENDENT_AMBULATORY_CARE_PROVIDER_SITE_OTHER): Payer: 59 | Admitting: Family Medicine

## 2019-01-25 ENCOUNTER — Other Ambulatory Visit: Payer: Self-pay

## 2019-01-25 VITALS — BP 143/96 | HR 89 | Temp 98.1°F | Ht 64.0 in | Wt 282.0 lb

## 2019-01-25 DIAGNOSIS — E1121 Type 2 diabetes mellitus with diabetic nephropathy: Secondary | ICD-10-CM | POA: Diagnosis not present

## 2019-01-25 DIAGNOSIS — F329 Major depressive disorder, single episode, unspecified: Secondary | ICD-10-CM

## 2019-01-25 DIAGNOSIS — F419 Anxiety disorder, unspecified: Secondary | ICD-10-CM | POA: Diagnosis not present

## 2019-01-25 DIAGNOSIS — Z9189 Other specified personal risk factors, not elsewhere classified: Secondary | ICD-10-CM | POA: Diagnosis not present

## 2019-01-25 DIAGNOSIS — N1831 Chronic kidney disease, stage 3a: Secondary | ICD-10-CM

## 2019-01-25 DIAGNOSIS — Z6841 Body Mass Index (BMI) 40.0 and over, adult: Secondary | ICD-10-CM

## 2019-01-25 DIAGNOSIS — E1122 Type 2 diabetes mellitus with diabetic chronic kidney disease: Secondary | ICD-10-CM

## 2019-01-25 MED ORDER — SERTRALINE HCL 50 MG PO TABS: 50.0000 mg | ORAL_TABLET | Freq: Every day | ORAL | 0 refills | Status: DC

## 2019-01-25 MED ORDER — RYBELSUS 7 MG PO TABS: 7.0000 mg | ORAL_TABLET | Freq: Every day | ORAL | 0 refills | Status: DC

## 2019-01-26 NOTE — Progress Notes (Signed)
Chief Complaint:   OBESITY Robyn [name] is here to discuss her progress with her obesity treatment plan along with follow-up of her obesity related diagnoses. Robyn [name] is on the [place] and states she is following her eating plan approximately 60% of the time. Robyn [name] states she is dancing for 15 minutes 3 times per week.  Today's visit was #: 23 Starting weight: 252 lbs Starting date: 01/18/2018 Today's weight: 242 lbs Today's date: 01/25/2019 Total lbs lost to date: 10 Total lbs lost since last in-office visit: 0  Interim History: Robyn [name] hasn't felt she has done so well. She had a very stressful holiday season, her son and her parents were all hospitalized with COVID. She doesn't like cottage cheese and a few options on the Vegetarian plan.  Subjective:   1. Type 2 diabetes mellitus with stage 3a chronic kidney disease, without long-term current use of insulin ([place]) Robyn [name] denies nausea or vomiting. She is not checking her blood sugars at home.  2. Anxiety and depression Robyn [name]'s symptoms are no longer controlled since Christmas. She notes difficultly sleeping at night and symptoms of anxiety worsening.  Assessment/Plan:   1. Type 2 diabetes mellitus with stage 3a chronic kidney disease, without long-term current use of insulin ([facility]) Good blood sugar control is important to decrease the likelihood of diabetic complications such as nephropathy, neuropathy, limb loss, blindness, coronary artery disease, and death. Intensive lifestyle modification including diet, exercise and weight loss are the first line of treatment for diabetes. We will refill Rybelsus for 1 month, and will continue to monitor.  - Semaglutide (RYBELSUS) 7 MG TABS; Take 7 mg by mouth daily.  Dispense: 30 tablet; Refill: 0  2. Anxiety and depression Behavior modification techniques were discussed today to help Robyn [name] deal with her emotional/non-hunger eating behaviors. We will refill Zoloft  for 1 month. Orders and follow up as documented in patient record.   - sertraline (ZOLOFT) 50 MG tablet; Take 1 tablet (50 mg total) by mouth daily.  Dispense: 30 tablet; Refill: 0  3. At risk for deficient knowledge of diabetes mellitus Robyn [name] was given approximately 15 minutes of diabetes education and counseling today. We discussed intensive lifestyle modifications today with an emphasis on weight loss as well as increasing exercise and decreasing simple carbohydrates in her diet. We also reviewed medication options with an emphasis on risk versus benefit of those discussed.   4. Class 3 severe obesity with serious comorbidity and body mass index (BMI) of 45.0 to 49.9 in adult, unspecified obesity type ([place]) Robyn [name] is currently in the action stage of change. As such, her goal is to continue with weight loss efforts. She has agreed to the [place] + 300 calorie substitutions.   Exercise goals: For substantial health benefits, adults should do at least 150 minutes (2 hours and 30 minutes) a week of moderate-intensity, or 75 minutes (1 hour and 15 minutes) a week of vigorous-intensity aerobic physical activity, or an equivalent combination of moderate- and vigorous-intensity aerobic activity. Aerobic activity should be performed in episodes of at least 10 minutes, and preferably, it should be spread throughout the week. Adults should also include muscle-strengthening activities that involve all major muscle groups on 2 or more days a week.  Behavioral modification strategies: increasing lean protein intake, increasing vegetables, meal planning and cooking strategies, keeping healthy foods in the home and planning for success.  Robyn [name] has agreed to follow-up with our clinic in 2 weeks. She was informed of the importance of frequent  follow-up visits to maximize her success with intensive lifestyle modifications for her multiple health conditions.   Objective:   Blood pressure (!)  143/96, pulse 89, temperature 98.1 F (36.7 C), temperature source Oral, height 5\' 4"  (1.626 m), weight 282 lb (127.9 kg), SpO2 98 %. Body mass index is 48.41 kg/m.  General: Cooperative, alert, well developed, in no acute distress. HEENT: Conjunctivae and lids unremarkable. Cardiovascular: Regular rhythm.  Lungs: Normal work of breathing. Neurologic: No focal deficits.   Lab Results  Component Value Date   CREATININE 1.42 (H) 11/29/2018   BUN 17 11/29/2018   NA 138 11/29/2018   K 3.5 11/29/2018   CL 97 11/29/2018   CO2 26 11/29/2018   Lab Results  Component Value Date   ALT 20 11/29/2018   AST 15 11/29/2018   ALKPHOS 89 11/29/2018   BILITOT 0.8 11/29/2018   Lab Results  Component Value Date   HGBA1C 5.7 (H) 11/29/2018   HGBA1C 6.7 (H) 07/28/2018   HGBA1C 5.3 09/24/2017   HGBA1C 5.1 04/15/2017   HGBA1C 6.0 (H) 01/14/2012   Lab Results  Component Value Date   INSULIN 25.4 (H) 11/29/2018   INSULIN 56.2 (H) 07/28/2018   Lab Results  Component Value Date   TSH 3.250 11/29/2018   Lab Results  Component Value Date   CHOL 220 (H) 11/29/2018   HDL 46 11/29/2018   LDLCALC 129 (H) 11/29/2018   TRIG 253 (H) 11/29/2018   CHOLHDL 4.8 08/20/2010   Lab Results  Component Value Date   WBC 8.2 05/17/2018   HGB 12.3 05/17/2018   HCT 34.9 05/17/2018   MCV 90 05/17/2018   PLT 286 05/17/2018   No results found for: IRON, TIBC, FERRITIN  Attestation Statements:   Reviewed by clinician on day of visit: allergies, medications, problem list, medical history, surgical history, family history, social history, and previous encounter notes.   I, Trixie Dredge, am acting as transcriptionist for Ilene Qua, MD.  I have reviewed the above documentation for accuracy and completeness, and I agree with the above. -  AK

## 2019-01-29 ENCOUNTER — Other Ambulatory Visit (INDEPENDENT_AMBULATORY_CARE_PROVIDER_SITE_OTHER): Payer: Self-pay | Admitting: Family Medicine

## 2019-01-29 DIAGNOSIS — N1831 Chronic kidney disease, stage 3a: Secondary | ICD-10-CM

## 2019-02-02 ENCOUNTER — Encounter: Payer: Self-pay | Admitting: Adult Health

## 2019-02-02 ENCOUNTER — Telehealth (INDEPENDENT_AMBULATORY_CARE_PROVIDER_SITE_OTHER): Payer: 59 | Admitting: Adult Health

## 2019-02-02 DIAGNOSIS — G4709 Other insomnia: Secondary | ICD-10-CM | POA: Diagnosis not present

## 2019-02-02 DIAGNOSIS — G4733 Obstructive sleep apnea (adult) (pediatric): Secondary | ICD-10-CM

## 2019-02-02 DIAGNOSIS — Z9989 Dependence on other enabling machines and devices: Secondary | ICD-10-CM

## 2019-02-02 MED ORDER — BELSOMRA 5 MG PO TABS: 5.0000 mg | ORAL_TABLET | Freq: Every day | ORAL | 5 refills | Status: DC

## 2019-02-02 MED ORDER — MOMETASONE FUROATE 50 MCG/ACT NA SUSP: 2.0000 | Freq: Every day | NASAL | 12 refills | Status: DC

## 2019-02-02 NOTE — Progress Notes (Signed)
PATIENT: Robyn [name] DOB: January 20, 1970  REASON FOR VISIT: follow up HISTORY FROM: patient  Virtual Visit via Video Note  I connected with Robyn [name] on 02/02/19 at  8:00 AM EST by a video enabled telemedicine application located remotely at [facility] and verified that I am speaking with the correct person using two identifiers who was located at their own home.   I discussed the limitations of evaluation and management by telemedicine and the availability of in person appointments. The patient expressed understanding and agreed to proceed.   PATIENT: Robyn [name] DOB: 03-Dec-1970  REASON FOR VISIT: follow up HISTORY FROM: patient  HISTORY OF PRESENT ILLNESS: Today 02/02/19   Robyn [name] is a 49 year old female with a history of obstructive sleep apnea on CPAP. She returns today for follow-up. Her download indicates that she uses her machine 27 out of 30 days for compliance of 90%. She uses her machine greater than 4 hours 12 days for compliance of 40%. On average she uses her machine 3 hours and 38 minutes. Her residual AHI is 0.3 on 6 to 18 cm of water with EPR of 3. Her leak in the 95th percentile is 4.4 L/min. She reports that most nights she tries to get in bed between 9 and 10 PM. She states that she may not fall asleep until 1 or 2 AM. She states that when she falls asleep she may sleep only 2 or 3 hours and that she is back up again. She states that she has been taking melatonin 10 mg at bedtime with little benefit. She states that several years ago she took Ambien. She states that she tries to do things like reading before bed to help stimulate good sleep but reports that has not been beneficial. Subsequently she is tired most days. She currently works 8-5. She returns today for an evaluation.   REVIEW OF SYSTEMS: Out of a complete 14 system review of symptoms, the patient complains only of the following symptoms, and all other reviewed systems  are negative.  See HPI  ALLERGIES: Allergies  Allergen Reactions  . Sulfa Antibiotics Hives and Rash    HOME MEDICATIONS: Outpatient Medications Prior to Visit  Medication Sig Dispense Refill  . acetaminophen (TYLENOL) 500 MG tablet Take 1 tablet (500 mg total) by mouth every 6 (six) hours as needed. (Patient taking differently: Take 500 mg by mouth every 6 (six) hours as needed for moderate pain. ) 90 tablet 0  . allopurinol (ZYLOPRIM) 100 MG tablet Take 100 mg by mouth daily.     Marland Kitchen amLODipine (NORVASC) 5 MG tablet Take 5 mg by mouth at bedtime.    . carvedilol (COREG) 25 MG tablet Take 1 tablet (25 mg total) by mouth 2 (two) times daily with a meal. 30 tablet 0  . chlorthalidone (HYGROTON) 25 MG tablet Take 25 mg by mouth daily.    . colchicine 0.6 MG tablet Take 0.6 mg by mouth daily as needed (gout).     Marland Kitchen levothyroxine (SYNTHROID) 25 MCG tablet Take 1 tablet (25 mcg total) by mouth daily. 90 tablet 3  . lisinopril (PRINIVIL,ZESTRIL) 40 MG tablet Take 40 mg by mouth daily.    . metFORMIN (GLUCOPHAGE) 500 MG tablet TAKE 1 TABLET BY MOUTH TWICE A DAY 30 tablet 0  . Multiple Vitamins-Minerals (MULTIVITAMIN WITH MINERALS) tablet Take 1 tablet by mouth daily.    . Semaglutide (RYBELSUS) 7 MG TABS Take 7 mg by mouth daily. [place]  tablet 0  . sertraline (ZOLOFT) 50 MG tablet Take 1 tablet (50 mg total) by mouth daily. 30 tablet 0   No facility-administered medications prior to visit.    PAST MEDICAL HISTORY: Past Medical History:  Diagnosis Date  . Abnormal Pap smear   . Allergy   . Anxiety    OCCAS PANIC ATTACKS  . Back pain   . Bilateral swelling of feet   . Blood transfusion 1990'S  . Blood transfusion without reported diagnosis   . BV (bacterial vaginosis)   . Cough    nonproductive cough last 2 weeks  . Depression 2010  . Fibroid 2011  . Gallbladder problem   . GERD (gastroesophageal reflux disease)    occasional  . H/O dysmenorrhea   . H/O varicella   . H/O:  menorrhagia 2011  . Hypertension   . Hypokalemia    PAST HX  . Hypothyroid   . Increased BMI   . Kidney problem   . Kidney stones   . Obesity   . OSA (obstructive sleep apnea) 04/22/2013  . Ovarian cyst 2011  . Perimenopausal symptoms 07/15/2010  . Pre-diabetes   . Pregnancy induced hypertension   . Shortness of breath    ONLY WITH ANXIETY  . Sleep apnea    PT USES CPAP SOMETIMES - SETTING IS 3  . Vitamin D deficiency   . Weight loss 2010    PAST SURGICAL HISTORY: Past Surgical History:  Procedure Laterality Date  . C-SECTIONS X 2    . [place]  . CHOLECYSTECTOMY  1994  . COLONOSCOPY WITH PROPOFOL N/A 10/26/2018   Procedure: COLONOSCOPY WITH PROPOFOL;  Surgeon: Juanita Craver, MD;  Location: [facility];  Service: Endoscopy;  Laterality: N/A;  . NEPHROLITHOTOMY  04/09/2011   Procedure: NEPHROLITHOTOMY PERCUTANEOUS;  Surgeon: Molli Hazard, MD;  Location: [facility];  Service: Urology;  Laterality: Right;      . NEPHROLITHOTOMY  10/15/2011   Procedure: NEPHROLITHOTOMY PERCUTANEOUS;  Surgeon: Molli Hazard, MD;  Location: [facility];  Service: Urology;  Laterality: Left;  . PERCUTANEOUS NEPHROSTOMY AROUND 1996  may 2013   right kidney  . REMOVAL OF STONES  10/15/2011   Procedure: REMOVAL OF STONES;  Surgeon: Molli Hazard, MD;  Location: [facility];  Service: Urology;  Laterality: Left;  . TUBAL LIGATION  1999  . URETEROSCOPY  04/09/2011   Procedure: URETEROSCOPY;  Surgeon: Molli Hazard, MD;  Location: [facility];  Service: Urology;  Laterality: Right;  . UTERINE ABLATION MARCH 2010  march 2011    FAMILY HISTORY: Family History  Problem Relation Age of Onset  . Diabetes Mother   . Hypertension Mother   . Obesity Mother   . Hypertension Father   . Diabetes Father   . Kidney disease Father   . Stroke Father   . Sleep apnea Father   . Diabetes Paternal Grandmother   . Diabetes Maternal Grandmother   . Heart disease Maternal  Grandmother     SOCIAL HISTORY: Social History   Socioeconomic History  . Marital status: Married    Spouse name: Robyn [name]  . Number of children: 2  . Years of education: 15+  . Highest education level: Not on file  Occupational History  . Occupation: PROGRAM Theme park manager: [employer]  Tobacco Use  . Smoking status: Former Smoker    Packs/day: 0.25    Years: 15.00    Pack years: 3.75  . Smokeless  tobacco: Never Used  Substance and Sexual Activity  . Alcohol use: No  . Drug use: No  . Sexual activity: Yes    Birth control/protection: Surgical    Comment: BTL 1999  Other Topics Concern  . Not on file  Social History Narrative   Patient is married Robyn [name]) and lives at home with her husband and daughter.   Patient has two children.   Patient is working and attending college full-time.   Patient has a college education.   Patient is right-handed.   Patient drinks two cups of coffee daily and one cup of soda and tea daily.   Social Determinants of Health   Financial Resource Strain:   . Difficulty of Paying Living Expenses: Not on file  Food Insecurity:   . Worried About Charity fundraiser in the Last Year: Not on file  . Ran Out of Food in the Last Year: Not on file  Transportation Needs:   . Lack of Transportation (Medical): Not on file  . Lack of Transportation (Non-Medical): Not on file  Physical Activity:   . Days of Exercise per Week: Not on file  . Minutes of Exercise per Session: Not on file  Stress:   . Feeling of Stress : Not on file  Social Connections:   . Frequency of Communication with Friends and Family: Not on file  . Frequency of Social Gatherings with Friends and Family: Not on file  . Attends Religious Services: Not on file  . Active Member of Clubs or Organizations: Not on file  . Attends Archivist Meetings: Not on file  . Marital Status: Not on file  Intimate Partner Violence:   . Fear of Current or Ex-Partner: Not on file    . Emotionally Abused: Not on file  . Physically Abused: Not on file  . Sexually Abused: Not on file      PHYSICAL EXAM Generalized: Well developed, in no acute distress   Neurological examination  Mentation: Alert oriented to time, place, history taking. Follows all commands speech and language fluent Cranial nerve II-XII:Extraocular movements were full. Facial symmetry noted. uvula tongue midline. Head turning and shoulder shrug  were normal and symmetric. Motor: Good strength throughout subjectively per patient Sensory: Sensory testing is intact to soft touch on all 4 extremities subjectively per patient Coordination: Cerebellar testing reveals good finger-nose-finger  Gait and station: Patient is able to stand from a seated position. gait is normal.  Reflexes: UTA  DIAGNOSTIC DATA (LABS, IMAGING, TESTING) - I reviewed patient records, labs, notes, testing and imaging myself where available.  Lab Results  Component Value Date   WBC 8.2 05/17/2018   HGB 12.3 05/17/2018   HCT 34.9 05/17/2018   MCV 90 05/17/2018   PLT 286 05/17/2018      Component Value Date/Time   NA 138 11/29/2018 0815   K 3.5 11/29/2018 0815   CL 97 11/29/2018 0815   CO2 26 11/29/2018 0815   GLUCOSE 110 (H) 11/29/2018 0815   GLUCOSE 98 07/25/2012 1350   BUN 17 11/29/2018 0815   CREATININE 1.42 (H) 11/29/2018 0815   CREATININE 1.14 (H) 05/31/2012 1653   CALCIUM 9.6 11/29/2018 0815   PROT 6.6 11/29/2018 0815   ALBUMIN 4.2 11/29/2018 0815   AST 15 11/29/2018 0815   ALT 20 11/29/2018 0815   ALKPHOS 89 11/29/2018 0815   BILITOT 0.8 11/29/2018 0815   GFRNONAA 44 (L) 11/29/2018 0815   GFRAA 51 (L) 11/29/2018 0815   Lab  Results  Component Value Date   CHOL 220 (H) 11/29/2018   HDL 46 11/29/2018   LDLCALC 129 (H) 11/29/2018   TRIG 253 (H) 11/29/2018   CHOLHDL 4.8 08/20/2010   Lab Results  Component Value Date   HGBA1C 5.7 (H) 11/29/2018   Lab Results  Component Value Date   VITAMINB12 615  07/28/2018   Lab Results  Component Value Date   TSH 3.250 11/29/2018      ASSESSMENT AND PLAN 49 y.o. year old female  has a past medical history of Abnormal Pap smear, Allergy, Anxiety, Back pain, Bilateral swelling of feet, Blood transfusion (1990'S), Blood transfusion without reported diagnosis, BV (bacterial vaginosis), Cough, Depression (2010), Fibroid (2011), Gallbladder problem, GERD (gastroesophageal reflux disease), H/O dysmenorrhea, H/O varicella, H/O: menorrhagia (2011), Hypertension, Hypokalemia, Hypothyroid, Increased BMI, Kidney problem, Kidney stones, Obesity, OSA (obstructive sleep apnea) (04/22/2013), Ovarian cyst (2011), Perimenopausal symptoms (07/15/2010), Pre-diabetes, Pregnancy induced hypertension, Shortness of breath, Sleep apnea, Vitamin D deficiency, and Weight loss (2010). here with :  1. Obstructive sleep apnea on CPAP 2. Insomnia  The patient CPAP download shows suboptimal compliance but good treatment of her apnea when she is using the machine. The patient is having trouble with insomnia. We discussed starting Belsomra. She is amenable to trying this medication. I have reviewed potential side effects with the patient. She began taking 5 mg at bedtime. Advised that if this is not beneficial she should let us know.  Nasonex was refilled for the patient .she will follow-up in 6 months or sooner if needed.     Ward Givens, MSN, NP-C 02/02/2019, 8:36 AM [facility] [address] [place], [place] [address] [phone]

## 2019-02-07 ENCOUNTER — Encounter: Payer: Self-pay | Admitting: Adult Health

## 2019-02-08 ENCOUNTER — Ambulatory Visit (INDEPENDENT_AMBULATORY_CARE_PROVIDER_SITE_OTHER): Payer: 59 | Admitting: Family Medicine

## 2019-02-08 ENCOUNTER — Encounter (INDEPENDENT_AMBULATORY_CARE_PROVIDER_SITE_OTHER): Payer: Self-pay | Admitting: Family Medicine

## 2019-02-08 ENCOUNTER — Other Ambulatory Visit: Payer: Self-pay

## 2019-02-08 VITALS — BP 141/91 | HR 68 | Temp 98.5°F | Ht 64.0 in | Wt 279.0 lb

## 2019-02-08 DIAGNOSIS — E1165 Type 2 diabetes mellitus with hyperglycemia: Secondary | ICD-10-CM | POA: Diagnosis not present

## 2019-02-08 DIAGNOSIS — E7849 Other hyperlipidemia: Secondary | ICD-10-CM

## 2019-02-08 DIAGNOSIS — Z6841 Body Mass Index (BMI) 40.0 and over, adult: Secondary | ICD-10-CM

## 2019-02-08 DIAGNOSIS — F419 Anxiety disorder, unspecified: Secondary | ICD-10-CM | POA: Diagnosis not present

## 2019-02-08 NOTE — Progress Notes (Signed)
Chief Complaint:   OBESITY Robyn [name] is here to discuss her progress with her obesity treatment plan along with follow-up of her obesity related diagnoses. Robyn [name] is on the [place] +300 calories and states she is following her eating plan approximately 60% of the time. [name] states she is doing FitOn app 15 minutes 5 times per 1 week.  Today's visit was #: 12 Starting weight: 294 lbs Starting date: 07/28/2018 Today's weight: 279 lbs Today's date: 02/08/2019 Total lbs lost to date: 15 Total lbs lost since last in-office visit: 3  Interim History: The fast ended, but patient is working on getting all of the food in. She switched back to the Category 3 plan. She would like to get back on track. She does anticipate having indulgent food for Super Bowl.  Subjective:   Anxiety Sybol's mood has improved and she feels more focused and relaxed. This is a definite improvement from previously.  Type 2 diabetes mellitus with hyperglycemia, without long-term current use of insulin (HCC) Robyn [name]'s last Hgb A1c was 5.7 and she has insulin level of 25.4. She is on Rybelsus and she has no side effects of nausea or abdominal pain.  Lab Results  Component Value Date   HGBA1C 5.7 (H) 11/29/2018   HGBA1C 6.7 (H) 07/28/2018   HGBA1C 5.3 09/24/2017   Lab Results  Component Value Date   LDLCALC 129 (H) 11/29/2018   CREATININE 1.42 (H) 11/29/2018   Lab Results  Component Value Date   INSULIN 25.4 (H) 11/29/2018   INSULIN 56.2 (H) 07/28/2018   Other hyperlipidemia Robyn [name] has hyperlipidemia and her LDL is 129, HDL is 46 and Triglycerides are at 253. She is not on statin. Robyn [name] has been trying to improve her cholesterol levels with intensive lifestyle modification including a low saturated fat diet, exercise and weight loss. She denies any chest pain.  Lab Results  Component Value Date   ALT 20 11/29/2018   AST 15 11/29/2018   ALKPHOS 89 11/29/2018   BILITOT 0.8 11/29/2018   Lab Results  Component Value Date   CHOL 220 (H) 11/29/2018   HDL 46 11/29/2018   LDLCALC 129 (H) 11/29/2018   TRIG 253 (H) 11/29/2018   CHOLHDL 4.8 08/20/2010    Assessment/Plan:   Anxiety Birdella will continue Zoloft (no refill needed).  Type 2 diabetes mellitus with hyperglycemia, without long-term current use of insulin (HCC) Good blood sugar control is important to decrease the likelihood of diabetic complications such as nephropathy, neuropathy, limb loss, blindness, coronary artery disease, and death. Intensive lifestyle modification including diet, exercise and weight loss are the first line of treatment for diabetes. Kadence will continue Rybelsus.  Other hyperlipidemia Cardiovascular risk and specific lipid/LDL goals reviewed.  We discussed several lifestyle modifications today and Jasman will continue to work on diet, exercise and weight loss efforts. We will repeat labs in early March. Orders and follow up as documented in patient record.   Counseling Intensive lifestyle modifications are the first line treatment for this issue. . Dietary changes: Increase soluble fiber. Decrease simple carbohydrates. . Exercise changes: Moderate to vigorous-intensity aerobic activity 150 minutes per week if tolerated. . Lipid-lowering medications: see documented in medical record.  Class 3 severe obesity with serious comorbidity and body mass index (BMI) of 45.0 to 49.9 in adult, unspecified obesity type (HCC) Sharan is currently in the action stage of change. As such, her goal is to continue with weight loss efforts. She has agreed to the Category  3 Plan.   Exercise goals: Robyn [name] will continue her current exercise regimen.  Behavioral modification strategies: increasing lean protein intake, increasing vegetables, meal planning and cooking strategies, keeping healthy foods in the home and planning for success.  Robyn [name] has agreed to follow-up  with our clinic in 2 weeks. She was informed of the importance of frequent follow-up visits to maximize her success with intensive lifestyle modifications for her multiple health conditions.   Objective:   Blood pressure (!) 141/91, pulse 68, temperature 98.5 F (36.9 C), temperature source Oral, height 5\' 4"  (1.626 m), weight 279 lb (126.6 kg), SpO2 96 %. Body mass index is 47.89 kg/m.  General: Cooperative, alert, well developed, in no acute distress. HEENT: Conjunctivae and lids unremarkable. Cardiovascular: Regular rhythm.  Lungs: Normal work of breathing. Neurologic: No focal deficits.   Lab Results  Component Value Date   CREATININE 1.42 (H) 11/29/2018   BUN 17 11/29/2018   NA 138 11/29/2018   K 3.5 11/29/2018   CL 97 11/29/2018   CO2 26 11/29/2018   Lab Results  Component Value Date   ALT 20 11/29/2018   AST 15 11/29/2018   ALKPHOS 89 11/29/2018   BILITOT 0.8 11/29/2018   Lab Results  Component Value Date   HGBA1C 5.7 (H) 11/29/2018   HGBA1C 6.7 (H) 07/28/2018   HGBA1C 5.3 09/24/2017   HGBA1C 5.1 04/15/2017   HGBA1C 6.0 (H) 01/14/2012   Lab Results  Component Value Date   INSULIN 25.4 (H) 11/29/2018   INSULIN 56.2 (H) 07/28/2018   Lab Results  Component Value Date   TSH 3.250 11/29/2018   Lab Results  Component Value Date   CHOL 220 (H) 11/29/2018   HDL 46 11/29/2018   LDLCALC 129 (H) 11/29/2018   TRIG 253 (H) 11/29/2018   CHOLHDL 4.8 08/20/2010   Lab Results  Component Value Date   WBC 8.2 05/17/2018   HGB 12.3 05/17/2018   HCT 34.9 05/17/2018   MCV 90 05/17/2018   PLT 286 05/17/2018   No results found for: IRON, TIBC, FERRITIN   Ref. Range 11/29/2018 08:15  Vitamin D, 25-Hydroxy Latest Ref Range: 30.0 - 100.0 ng/mL 28.5 (L)    Attestation Statements:   Reviewed by clinician on day of visit: allergies, medications, problem list, medical history, surgical history, family history, social history, and previous encounter notes.  Time spent  on visit including pre-visit chart review and post-visit care was 17 minutes.   I, Doreene Nest, am acting as transcriptionist for Eber Jones, MD.  I have reviewed the above documentation for accuracy and completeness, and I agree with the above. - Ilene Qua, MD

## 2019-02-10 ENCOUNTER — Telehealth: Payer: Self-pay

## 2019-02-10 ENCOUNTER — Other Ambulatory Visit (INDEPENDENT_AMBULATORY_CARE_PROVIDER_SITE_OTHER): Payer: Self-pay | Admitting: Family Medicine

## 2019-02-10 DIAGNOSIS — E1122 Type 2 diabetes mellitus with diabetic chronic kidney disease: Secondary | ICD-10-CM

## 2019-02-10 NOTE — Telephone Encounter (Signed)
Please advise patient that she can buy nasonex or flonase OTC. If she prefers for me to send a script I will

## 2019-02-10 NOTE — Telephone Encounter (Signed)
Received a fax from the patient's pharmacy stating that Nasonex is not covered. The preferred medication is Flonase. Please advise

## 2019-02-14 ENCOUNTER — Other Ambulatory Visit (INDEPENDENT_AMBULATORY_CARE_PROVIDER_SITE_OTHER): Payer: Self-pay | Admitting: Family Medicine

## 2019-02-14 DIAGNOSIS — E1122 Type 2 diabetes mellitus with diabetic chronic kidney disease: Secondary | ICD-10-CM

## 2019-02-16 ENCOUNTER — Other Ambulatory Visit (INDEPENDENT_AMBULATORY_CARE_PROVIDER_SITE_OTHER): Payer: Self-pay | Admitting: Family Medicine

## 2019-02-16 DIAGNOSIS — F419 Anxiety disorder, unspecified: Secondary | ICD-10-CM

## 2019-02-16 DIAGNOSIS — F329 Major depressive disorder, single episode, unspecified: Secondary | ICD-10-CM

## 2019-02-22 ENCOUNTER — Ambulatory Visit (INDEPENDENT_AMBULATORY_CARE_PROVIDER_SITE_OTHER): Payer: 59 | Admitting: Family Medicine

## 2019-02-28 ENCOUNTER — Other Ambulatory Visit: Payer: Self-pay | Admitting: Neurology

## 2019-02-28 ENCOUNTER — Encounter: Payer: Self-pay | Admitting: Neurology

## 2019-02-28 MED ORDER — BELSOMRA 10 MG PO TABS: 10.0000 mg | ORAL_TABLET | Freq: Every evening | ORAL | 5 refills | Status: DC | PRN

## 2019-03-01 ENCOUNTER — Other Ambulatory Visit: Payer: Self-pay

## 2019-03-01 ENCOUNTER — Ambulatory Visit (INDEPENDENT_AMBULATORY_CARE_PROVIDER_SITE_OTHER): Payer: 59 | Admitting: Family Medicine

## 2019-03-01 ENCOUNTER — Encounter (INDEPENDENT_AMBULATORY_CARE_PROVIDER_SITE_OTHER): Payer: Self-pay | Admitting: Family Medicine

## 2019-03-01 VITALS — BP 135/67 | HR 76 | Temp 98.9°F | Ht 64.0 in | Wt 278.0 lb

## 2019-03-01 DIAGNOSIS — Z6841 Body Mass Index (BMI) 40.0 and over, adult: Secondary | ICD-10-CM

## 2019-03-01 DIAGNOSIS — Z9189 Other specified personal risk factors, not elsewhere classified: Secondary | ICD-10-CM

## 2019-03-01 DIAGNOSIS — F32A Depression, unspecified: Secondary | ICD-10-CM

## 2019-03-01 DIAGNOSIS — F419 Anxiety disorder, unspecified: Secondary | ICD-10-CM

## 2019-03-01 DIAGNOSIS — E1165 Type 2 diabetes mellitus with hyperglycemia: Secondary | ICD-10-CM | POA: Diagnosis not present

## 2019-03-01 DIAGNOSIS — F329 Major depressive disorder, single episode, unspecified: Secondary | ICD-10-CM

## 2019-03-01 MED ORDER — RYBELSUS 7 MG PO TABS: 7.0000 mg | ORAL_TABLET | Freq: Every day | ORAL | 0 refills | Status: DC

## 2019-03-01 MED ORDER — SERTRALINE HCL 50 MG PO TABS: 50.0000 mg | ORAL_TABLET | Freq: Every day | ORAL | 0 refills | Status: DC

## 2019-03-02 ENCOUNTER — Encounter (INDEPENDENT_AMBULATORY_CARE_PROVIDER_SITE_OTHER): Payer: Self-pay | Admitting: Family Medicine

## 2019-03-02 ENCOUNTER — Other Ambulatory Visit (INDEPENDENT_AMBULATORY_CARE_PROVIDER_SITE_OTHER): Payer: Self-pay

## 2019-03-02 DIAGNOSIS — F329 Major depressive disorder, single episode, unspecified: Secondary | ICD-10-CM

## 2019-03-02 DIAGNOSIS — F419 Anxiety disorder, unspecified: Secondary | ICD-10-CM

## 2019-03-02 DIAGNOSIS — F32A Depression, unspecified: Secondary | ICD-10-CM

## 2019-03-02 MED ORDER — TOPIRAMATE 25 MG PO TABS: 25.0000 mg | ORAL_TABLET | Freq: Every day | ORAL | 0 refills | Status: DC

## 2019-03-02 NOTE — Telephone Encounter (Signed)
Please advise 

## 2019-03-02 NOTE — Progress Notes (Signed)
Chief Complaint:   OBESITY Robyn [name] is here to discuss her progress with her obesity treatment plan along with follow-up of her obesity related diagnoses. Robyn [name] is on the Category 3 Plan and states she is following her eating plan approximately 75% of the time. Robyn [name] states she is exercising 0 minutes 0 times per week.  Today's visit was #: 13 Starting weight: 294 lbs Starting date: 07/28/2018 Today's weight: 278 lbs Today's date: 03/01/2019 Total lbs lost to date: 16 Total lbs lost since last in-office visit: 1  Interim History: The last few weeks was a bit up and down. Patient wants to get back to do closer to 100% following the plan. She does feel she knows her weakness, which is drinking soda. She is going to be out of town for a week and she doesn't think the Category 3 is doable during that time. Robyn [name] joined a Geophysicist/field seismologist.  Subjective:   Type 2 diabetes mellitus with hyperglycemia, without long-term current use of insulin (HCC) -  Robyn [name] has no feelings of hypoglycemia. She has no GI side effects of Rybelsus or Metformin.  Lab Results  Component Value Date   HGBA1C 5.7 (H) 11/29/2018   HGBA1C 6.7 (H) 07/28/2018   HGBA1C 5.3 09/24/2017   Lab Results  Component Value Date   LDLCALC 129 (H) 11/29/2018   CREATININE 1.42 (H) 11/29/2018   Lab Results  Component Value Date   INSULIN 25.4 (H) 11/29/2018   INSULIN 56.2 (H) 07/28/2018   Anxiety and depression  Robyn [name]'s symptoms are much better controlled. No side effects are noted.  At risk for hypoglycemia Robyn [name] is at increased risk for hypoglycemia due to changes in diet, diagnosis of diabetes, and/or insulin use. Robyn [name] is not currently taking insulin.   Assessment/Plan:   Type 2 diabetes mellitus with hyperglycemia, without long-term current use of insulin (HCC) -  Good blood sugar control is important to decrease the likelihood of diabetic complications such as nephropathy,  neuropathy, limb loss, blindness, coronary artery disease, and death. Intensive lifestyle modification including diet, exercise and weight loss are the first line of treatment for diabetes. Rayshell agrees to continue Semaglutide (RYBELSUS) 7 MG PO daily #30 with no refills.  Anxiety and depression  Robyn [name] agrees to continue sertraline (ZOLOFT) 50 MG PO daily #30 with no refills.  At risk for hypoglycemia Robyn [name] was given approximately 15 minutes of counseling today regarding prevention of hypoglycemia. She was advised of symptoms of hypoglycemia. Robyn [name] was instructed to avoid skipping meals, eat regular protein rich meals and schedule low calorie snacks as needed.   Repetitive spaced learning was employed today to elicit superior memory formation and behavioral change  Class 3 severe obesity with serious comorbidity and body mass index (BMI) of 45.0 to 49.9 in adult, unspecified obesity type (HCC) Robyn [name] is currently in the action stage of change. As such, her goal is to continue with weight loss efforts. She has agreed to keeping a food journal and adhering to recommended goals of 1450 to 1600 calories and 100+ grams of protein daily.   Behavioral modification strategies: increasing lean protein intake, increasing vegetables, meal planning and cooking strategies, keeping healthy foods in the home and planning for success.  We discussed various medication options to help Robyn [name] with her weight loss efforts and we both agreed to start Topiramate 25mg  PO qAM #30 with no refills.  Robyn [name] has agreed to follow-up with our clinic in 3 weeks. She was informed of the importance of  frequent follow-up visits to maximize her success with intensive lifestyle modifications for her multiple health conditions.   Objective:   Blood pressure 135/67, pulse 76, temperature 98.9 F (37.2 C), temperature source Oral, height 5\' 4"  (1.626 m), weight 278 lb (126.1 kg), SpO2 96 %. Body mass index  is 47.72 kg/m.  General: Cooperative, alert, well developed, in no acute distress. HEENT: Conjunctivae and lids unremarkable. Cardiovascular: Regular rhythm.  Lungs: Normal work of breathing. Neurologic: No focal deficits.   Lab Results  Component Value Date   CREATININE 1.42 (H) 11/29/2018   BUN 17 11/29/2018   NA 138 11/29/2018   K 3.5 11/29/2018   CL 97 11/29/2018   CO2 26 11/29/2018   Lab Results  Component Value Date   ALT 20 11/29/2018   AST 15 11/29/2018   ALKPHOS 89 11/29/2018   BILITOT 0.8 11/29/2018   Lab Results  Component Value Date   HGBA1C 5.7 (H) 11/29/2018   HGBA1C 6.7 (H) 07/28/2018   HGBA1C 5.3 09/24/2017   HGBA1C 5.1 04/15/2017   HGBA1C 6.0 (H) 01/14/2012   Lab Results  Component Value Date   INSULIN 25.4 (H) 11/29/2018   INSULIN 56.2 (H) 07/28/2018   Lab Results  Component Value Date   TSH 3.250 11/29/2018   Lab Results  Component Value Date   CHOL 220 (H) 11/29/2018   HDL 46 11/29/2018   LDLCALC 129 (H) 11/29/2018   TRIG 253 (H) 11/29/2018   CHOLHDL 4.8 08/20/2010   Lab Results  Component Value Date   WBC 8.2 05/17/2018   HGB 12.3 05/17/2018   HCT 34.9 05/17/2018   MCV 90 05/17/2018   PLT 286 05/17/2018   No results found for: IRON, TIBC, FERRITIN   Ref. Range 11/29/2018 08:15  Vitamin D, 25-Hydroxy Latest Ref Range: 30.0 - 100.0 ng/mL 28.5 (L)   Attestation Statements:   Reviewed by clinician on day of visit: allergies, medications, problem list, medical history, surgical history, family history, social history, and previous encounter notes.  I, Doreene Nest, am acting as transcriptionist for Coralie Common, MD.  I have reviewed the above documentation for accuracy and completeness, and I agree with the above. - Ilene Qua, MD

## 2019-03-19 ENCOUNTER — Encounter: Payer: Self-pay | Admitting: Neurology

## 2019-03-21 ENCOUNTER — Other Ambulatory Visit: Payer: Self-pay | Admitting: Neurology

## 2019-03-21 MED ORDER — TRAZODONE HCL 50 MG PO TABS: 50.0000 mg | ORAL_TABLET | Freq: Every evening | ORAL | 0 refills | Status: DC | PRN

## 2019-03-22 ENCOUNTER — Ambulatory Visit (INDEPENDENT_AMBULATORY_CARE_PROVIDER_SITE_OTHER): Payer: 59 | Admitting: Family Medicine

## 2019-03-22 ENCOUNTER — Other Ambulatory Visit (INDEPENDENT_AMBULATORY_CARE_PROVIDER_SITE_OTHER): Payer: Self-pay | Admitting: Family Medicine

## 2019-03-22 DIAGNOSIS — E1165 Type 2 diabetes mellitus with hyperglycemia: Secondary | ICD-10-CM

## 2019-03-24 ENCOUNTER — Other Ambulatory Visit (INDEPENDENT_AMBULATORY_CARE_PROVIDER_SITE_OTHER): Payer: Self-pay | Admitting: Family Medicine

## 2019-03-24 ENCOUNTER — Encounter (INDEPENDENT_AMBULATORY_CARE_PROVIDER_SITE_OTHER): Payer: Self-pay

## 2019-03-24 DIAGNOSIS — F329 Major depressive disorder, single episode, unspecified: Secondary | ICD-10-CM

## 2019-03-24 DIAGNOSIS — F419 Anxiety disorder, unspecified: Secondary | ICD-10-CM

## 2019-04-06 ENCOUNTER — Encounter: Payer: Self-pay | Admitting: Neurology

## 2019-04-07 ENCOUNTER — Other Ambulatory Visit: Payer: Self-pay | Admitting: Neurology

## 2019-04-07 MED ORDER — TRAZODONE HCL 50 MG PO TABS: 50.0000 mg | ORAL_TABLET | Freq: Every evening | ORAL | 1 refills | Status: DC | PRN

## 2019-05-23 ENCOUNTER — Other Ambulatory Visit: Payer: Self-pay | Admitting: Family Medicine

## 2019-05-24 ENCOUNTER — Encounter: Payer: Self-pay | Admitting: Internal Medicine

## 2019-05-24 ENCOUNTER — Other Ambulatory Visit: Payer: Self-pay

## 2019-05-24 ENCOUNTER — Emergency Department (HOSPITAL_COMMUNITY)
Admission: EM | Admit: 2019-05-24 | Discharge: 2019-05-25 | Disposition: A | Discharge status: Home / Self Care | Payer: 59 | Attending: Emergency Medicine | Admitting: Emergency Medicine

## 2019-05-24 ENCOUNTER — Ambulatory Visit (HOSPITAL_COMMUNITY)
Admission: EM | Admit: 2019-05-24 | Discharge: 2019-05-24 | Disposition: A | Discharge status: Home / Self Care | Payer: 59 | Source: Home / Self Care

## 2019-05-24 ENCOUNTER — Encounter (HOSPITAL_COMMUNITY): Payer: Self-pay

## 2019-05-24 DIAGNOSIS — R109 Unspecified abdominal pain: Secondary | ICD-10-CM

## 2019-05-24 DIAGNOSIS — R1031 Right lower quadrant pain: Secondary | ICD-10-CM | POA: Insufficient documentation

## 2019-05-24 DIAGNOSIS — Z5321 Procedure and treatment not carried out due to patient leaving prior to being seen by health care provider: Secondary | ICD-10-CM | POA: Insufficient documentation

## 2019-05-24 LAB — COMPREHENSIVE METABOLIC PANEL
ALT: 24 U/L (ref 0–44)
AST: 19 U/L (ref 15–41)
Albumin: 3.9 g/dL (ref 3.5–5.0)
Alkaline Phosphatase: 61 U/L (ref 38–126)
Anion gap: 12 (ref 5–15)
BUN: 24 mg/dL — ABNORMAL HIGH (ref 6–20)
CO2: 26 mmol/L (ref 22–32)
Calcium: 9.8 mg/dL (ref 8.9–10.3)
Chloride: 101 mmol/L (ref 98–111)
Creatinine, Ser: 1.52 mg/dL — ABNORMAL HIGH (ref 0.44–1.00)
GFR calc Af Amer: 47 mL/min — ABNORMAL LOW (ref >60)
GFR calc non Af Amer: 40 mL/min — ABNORMAL LOW (ref >60)
Glucose, Bld: 93 mg/dL (ref 70–99)
Potassium: 3.5 mmol/L (ref 3.5–5.1)
Sodium: 139 mmol/L (ref 135–145)
Total Bilirubin: 0.7 mg/dL (ref 0.3–1.2)
Total Protein: 7.4 g/dL (ref 6.5–8.1)

## 2019-05-24 LAB — URINALYSIS, ROUTINE W REFLEX MICROSCOPIC
Bacteria, UA: NONE SEEN
Bilirubin Urine: NEGATIVE
Glucose, UA: NEGATIVE mg/dL
Hgb urine dipstick: NEGATIVE
Ketones, ur: NEGATIVE mg/dL
Leukocytes,Ua: NEGATIVE
Nitrite: NEGATIVE
Protein, ur: 100 mg/dL — AB
Specific Gravity, Urine: 1.016 (ref 1.005–1.030)
pH: 5 (ref 5.0–8.0)

## 2019-05-24 LAB — POCT URINALYSIS DIP (DEVICE)
Bilirubin Urine: NEGATIVE
Glucose, UA: NEGATIVE mg/dL
Hgb urine dipstick: NEGATIVE
Ketones, ur: NEGATIVE mg/dL
Leukocytes,Ua: NEGATIVE
Nitrite: NEGATIVE
Protein, ur: >=300 mg/dL — AB
Specific Gravity, Urine: 1.025 (ref 1.005–1.030)
Urobilinogen, UA: 0.2 mg/dL (ref 0.0–1.0)
pH: 5.5 (ref 5.0–8.0)

## 2019-05-24 LAB — CBC
HCT: 39.8 % (ref 36.0–46.0)
Hemoglobin: 13.5 g/dL (ref 12.0–15.0)
MCH: 31.2 pg (ref 26.0–34.0)
MCHC: 33.9 g/dL (ref 30.0–36.0)
MCV: 91.9 fL (ref 80.0–100.0)
Platelets: 294 10*3/uL (ref 150–400)
RBC: 4.33 MIL/uL (ref 3.87–5.11)
RDW: 12.7 % (ref 11.5–15.5)
WBC: 7.7 10*3/uL (ref 4.0–10.5)
nRBC: 0 % (ref 0.0–0.2)

## 2019-05-24 LAB — I-STAT BETA HCG BLOOD, ED (MC, WL, AP ONLY): I-stat hCG, quantitative: <5 m[IU]/mL (ref <5)

## 2019-05-24 LAB — LIPASE, BLOOD: Lipase: 40 U/L (ref 11–51)

## 2019-05-24 MED ORDER — SODIUM CHLORIDE 0.9% FLUSH: 3.0000 mL | Freq: Once | INTRAVENOUS | Status: DC

## 2019-05-24 NOTE — ED Triage Notes (Signed)
Pt arrives to ED w/ c/o 8/10 RLQ abdominal pain. Pt denies hx of appendectomy. Pt endorses nause, denies v/d. Pt denies fever.

## 2019-05-24 NOTE — ED Triage Notes (Signed)
Pt states she has cramping pain on the RLQ  This started last night.

## 2019-05-24 NOTE — Discharge Instructions (Addendum)
We do not have a good cause for your abdominal pain in the urgent care. As we discussed for more definitive work up tonight you should report to the Emergency Department further evaluation.

## 2019-05-24 NOTE — ED Provider Notes (Signed)
Charles Mix    CSN: [id number] Arrival date & time: 05/24/19  1515      History   Chief Complaint Chief Complaint  Patient presents with  . Abdominal Pain    HPI Robyn [name] is a 49 y.o. female.   Patient reports urgent care today for evaluation of right lower quadrant pain.  She reports this woke her up last night in the middle night.  She reports it was severe such that it was difficult to walk through most of the night and this morning.  She reports eventually she was able to get up and walk a little bit to get downstairs and go to work.  She reports the pain was more of a 7-8 out of 10 at this point.  She reports has been persistent throughout the day.  She reports she has been nauseous throughout the day but denies any vomiting.  She reports some chills but no fevers.  Reports diminished appetite.  She does report that she has had some pain in the lower abdomen with urination and and endorses urinary urgency.  Denies any blood in her urine.  Reports she has had little less stool the last few days and it was painful to try to have a bowel movement yesterday.  Reports having her gallbladder removed previously.  Reports urine ablation there is no way she is pregnant.  Denies vaginal bleeding discharge or pain.  Patient does have a remote history of renal stones but has not had one in several years.     Past Medical History:  Diagnosis Date  . Abnormal Pap smear   . Allergy   . Anxiety    OCCAS PANIC ATTACKS  . Back pain   . Bilateral swelling of feet   . Blood transfusion 1990'S  . Blood transfusion without reported diagnosis   . BV (bacterial vaginosis)   . Cough    nonproductive cough last 2 weeks  . Depression 2010  . Fibroid 2011  . Gallbladder problem   . GERD (gastroesophageal reflux disease)    occasional  . H/O dysmenorrhea   . H/O varicella   . H/O: menorrhagia 2011  . Hypertension   . Hypokalemia    PAST HX  . Hypothyroid   .  Increased BMI   . Kidney problem   . Kidney stones   . Obesity   . OSA (obstructive sleep apnea) 04/22/2013  . Ovarian cyst 2011  . Perimenopausal symptoms 07/15/2010  . Pre-diabetes   . Pregnancy induced hypertension   . Shortness of breath    ONLY WITH ANXIETY  . Sleep apnea    PT USES CPAP SOMETIMES - SETTING IS 3  . Vitamin D deficiency   . Weight loss 2010    Patient Active Problem List   Diagnosis Date Noted  . Vitamin D deficiency 12/06/2018  . Other hyperlipidemia 12/06/2018  . Type 2 diabetes mellitus with stage 3a chronic kidney disease, without long-term current use of insulin ([place]) 11/10/2018  . Class 3 severe obesity with serious comorbidity and body mass index (BMI) of 45.0 to 49.9 in adult ([place]) 11/10/2018  . Hot flashes due to menopause 04/27/2018  . Other insomnia 04/27/2018  . Poor compliance with CPAP treatment 04/27/2018  . Chronic nasal congestion 04/27/2018  . Uterine leiomyoma 02/06/2018  . Hypothyroidism 04/12/2014  . Hypersomnia, persistent 01/09/2014  . OSA on CPAP 01/09/2014  . Severe obesity (BMI >= 40) ([place]) 01/09/2014  . Unspecified sleep apnea 09/30/2012  .  Positive Helicobacter pylori titer 03/19/2012  . Chronic low back pain 01/22/2012  . Subclinical diabetes mellitus 01/22/2012  . Hypertension 12/28/2011  . Nephrolithiasis 10/15/2011  . Hypokalemia 08/22/2011  . CKD (chronic kidney disease), stage III 08/22/2011  . Anxiety disorder 07/29/2011  . Tobacco user 07/29/2011    Past Surgical History:  Procedure Laterality Date  . C-SECTIONS X 2    . [address]  . CHOLECYSTECTOMY  1994  . COLONOSCOPY WITH PROPOFOL N/A 10/26/2018   Procedure: COLONOSCOPY WITH PROPOFOL;  Surgeon: Juanita Craver, MD;  Location: [facility];  Service: Endoscopy;  Laterality: N/A;  . NEPHROLITHOTOMY  04/09/2011   Procedure: NEPHROLITHOTOMY PERCUTANEOUS;  Surgeon: Molli Hazard, MD;  Location: [facility];  Service: Urology;  Laterality:  Right;      . NEPHROLITHOTOMY  10/15/2011   Procedure: NEPHROLITHOTOMY PERCUTANEOUS;  Surgeon: Molli Hazard, MD;  Location: [facility];  Service: Urology;  Laterality: Left;  . PERCUTANEOUS NEPHROSTOMY AROUND 1996  may 2013   right kidney  . REMOVAL OF STONES  10/15/2011   Procedure: REMOVAL OF STONES;  Surgeon: Molli Hazard, MD;  [facility];  Service: Urology;  Laterality: Left;  . TUBAL LIGATION  1999  . URETEROSCOPY  04/09/2011   Procedure: URETEROSCOPY;  Surgeon: Molli Hazard, MD;  [facility];  Service: Urology;  Laterality: Right;  . UTERINE ABLATION MARCH 2010  march 2011    OB History    Gravida  2   Para  2   Term  2   Preterm      AB      Living  2     SAB      TAB      Ectopic      Multiple      Live Births  2            Home Medications    Prior to Admission medications   Medication Sig Start Date End Date Taking? Authorizing Provider  allopurinol (ZYLOPRIM) 100 MG tablet Take 100 mg by mouth daily.     [provider]  amLODipine (NORVASC) 5 MG tablet Take 5 mg by mouth at bedtime.    [provider]  carvedilol (COREG) 25 MG tablet Take 1 tablet (25 mg total) by mouth 2 (two) times daily with a meal. 05/15/15   Elby Beck, FNP  chlorthalidone (HYGROTON) 25 MG tablet Take 25 mg by mouth daily.    [provider]  colchicine 0.6 MG tablet Take 0.6 mg by mouth daily as needed (gout).     [provider]  levothyroxine (SYNTHROID) 25 MCG tablet TAKE 1 TABLET BY MOUTH EVERY DAY 05/23/19   Delia Chimes A, MD  metFORMIN (GLUCOPHAGE) 500 MG tablet TAKE 1 TABLET BY MOUTH TWICE A DAY 02/01/19   Eber Jones, MD  Multiple Vitamins-Minerals (MULTIVITAMIN WITH MINERALS) tablet Take 1 tablet by mouth daily.    [provider]  RYBELSUS 7 MG TABS TAKE 1 TAB BY MOUTH DAILY. 03/22/19   Eber Jones, MD  sertraline (ZOLOFT) 50 MG tablet Take 1 tablet (50 mg total) by  mouth daily. 03/01/19   Eber Jones, MD  topiramate (TOPAMAX) 25 MG tablet TAKE 1 TABLET BY MOUTH EVERY DAY 03/24/19   Eber Jones, MD  traZODone (DESYREL) 50 MG tablet Take 1 tablet (50 mg total) by mouth at bedtime as needed for sleep. 04/07/19   Dohmeier, Asencion Partridge, MD  Family History Family History  Problem Relation Age of Onset  . Diabetes Mother   . Hypertension Mother   . Obesity Mother   . Hypertension Father   . Diabetes Father   . Kidney disease Father   . Stroke Father   . Sleep apnea Father   . Diabetes Paternal Grandmother   . Diabetes Maternal Grandmother   . Heart disease Maternal Grandmother     Social History Social History   Tobacco Use  . Smoking status: Former Smoker    Packs/day: 0.25    Years: 15.00    Pack years: 3.75  . Smokeless tobacco: Never Used  Substance Use Topics  . Alcohol use: No  . Drug use: No     Allergies   Sulfa antibiotics   Review of Systems Review of Systems   Physical Exam Triage Vital Signs ED Triage Vitals  Enc Vitals Group     BP 05/24/19 1544 (!) 153/94     Pulse --      Resp 05/24/19 1544 18     Temp 05/24/19 1544 98.7 F (37.1 C)     Temp Source 05/24/19 1544 Oral     SpO2 05/24/19 1544 100 %     Weight 05/24/19 1542 275 lb (124.7 kg)     Height --      Head Circumference --      Peak Flow --      Pain Score 05/24/19 1542 7     Pain Loc --      Pain Edu? --      Excl. in [place]? --    No data found.  Updated Vital Signs BP (!) 153/94 (BP Location: Right Arm)   Temp 98.7 F (37.1 C) (Oral)   Resp 18   Wt 275 lb (124.7 kg)   SpO2 100%   BMI 47.20 kg/m   Visual Acuity Right Eye Distance:   Left Eye Distance:   Bilateral Distance:    Right Eye Near:   Left Eye Near:    Bilateral Near:     Physical Exam Vitals and nursing note reviewed.  Constitutional:      General: She is not in acute distress.    Appearance: She is well-developed.     Comments: Patient visibly in pain on  the exam table holding her right lower abdomen.  She has a slight pale appearance to her does not appear to feel well  HENT:     Head: Normocephalic and atraumatic.  Eyes:     Conjunctiva/sclera: Conjunctivae normal.  Cardiovascular:     Rate and Rhythm: Normal rate and regular rhythm.     Heart sounds: No murmur.  Pulmonary:     Effort: Pulmonary effort is normal. No respiratory distress.     Breath sounds: Normal breath sounds.  Abdominal:     General: Abdomen is protuberant.     Palpations: Abdomen is soft.     Tenderness: There is abdominal tenderness in the right lower quadrant. There is guarding and rebound (There is rebound both directly over the right lower quadrant and with release on the left lower quadrant). There is no right CVA tenderness or left CVA tenderness. Positive signs include McBurney's sign and obturator sign (mildly positive).     Hernia: No hernia is present.  Musculoskeletal:     Cervical back: Neck supple.  Skin:    General: Skin is warm and dry.  Neurological:     Mental Status: She is alert.  UC Treatments / Results  Labs (all labs ordered are listed, but only abnormal results are displayed) Labs Reviewed  POCT URINALYSIS DIP (DEVICE) - Abnormal; Notable for the following components:      Result Value   Protein, ur >=300 (*)    All other components within normal limits    EKG   Radiology No results found.  Procedures Procedures (including critical care time)  Medications Ordered in UC Medications - No data to display  Initial Impression / Assessment and Plan / UC Course  I have reviewed the triage vital signs and the nursing notes.  Pertinent labs & imaging results that were available during my care of the patient were reviewed by me and considered in my medical decision making (see chart for details).     #Right lower quadrant pain Patient is a 49 year old presenting with acute right lower quadrant pain.  She has associated  nausea.  UA was completely normal without sign of infection or blood, so lower suspicion for stone or UTI cause.  Given she has significant right lower quadrant tenderness on palpation with rebound both over right lower and other abdomen specks of the abdomen, differential would also include appendicitis vs torsion vs large ovarian cyst.  Discussed with patient that we do not have a clear-cut cause of her right lower quadrant pain at this time and that for more definitive evaluation that she would need to be seen in the emergency department.  Was shared decision making and discussed that she could monitor at home as she is afebrile with normal vital signs , but if she had continued pain, vomiting or worsening pain that she would need to go to the emergency department, however patient feels that she would like more answers sooner and to be evaluated there now and will report following discharge from urgent care.  Final Clinical Impressions(s) / UC Diagnoses   Final diagnoses:  Right lower quadrant abdominal pain     Discharge Instructions     We do not have a good cause for your abdominal pain in the urgent care. As we discussed for more definitive work up tonight you should report to the Emergency Department further evaluation.      ED Prescriptions    None     PDMP not reviewed this encounter.   Purnell Shoemaker, PA-C 05/24/19 1717

## 2019-05-25 NOTE — ED Notes (Signed)
Pt leaving AMA, stating she will follow up with her primary MD tomorrow. Told to return if symptoms worsen.

## 2019-07-01 ENCOUNTER — Ambulatory Visit: Payer: 59 | Admitting: Internal Medicine

## 2019-07-26 ENCOUNTER — Telehealth (INDEPENDENT_AMBULATORY_CARE_PROVIDER_SITE_OTHER): Payer: 59 | Admitting: Adult Health

## 2019-07-26 DIAGNOSIS — Z9989 Dependence on other enabling machines and devices: Secondary | ICD-10-CM

## 2019-07-26 DIAGNOSIS — G4709 Other insomnia: Secondary | ICD-10-CM

## 2019-07-26 DIAGNOSIS — G4733 Obstructive sleep apnea (adult) (pediatric): Secondary | ICD-10-CM | POA: Diagnosis not present

## 2019-07-26 NOTE — Progress Notes (Signed)
PATIENT: Robyn [name] DOB: Jan 27, 1970  REASON FOR VISIT: follow up HISTORY FROM: patient  Virtual Visit via Video Note  I connected with Robyn [name] on 07/26/19 at  2:00 PM EDT by a video enabled telemedicine application located remotely at [facility] and verified that I am speaking with the correct person using two identifiers who was located at their own home.   I discussed the limitations of evaluation and management by telemedicine and the availability of in person appointments. The patient expressed understanding and agreed to proceed.   PATIENT: Robyn [name] DOB: [birth year]  REASON FOR VISIT: follow up HISTORY FROM: patient  HISTORY OF PRESENT ILLNESS: Today 07/26/19:  Robyn [name] is a 49 year old female with a history of obstructive sleep apnea on CPAP.  Her download indicates that she use her machine 23 out of 30 days for compliance of 77%.  She use her machine greater than 4 hours 16 days for compliance of 53%.  On average she uses her machine 5 hours and 10 minutes.  Her residual AHI is 0.1 on 6 to 18 cm of water with EPR of 3.  Leak in the 95th percentile is 6.2 L/min.  Reports that the trazodone has been working fairly well there are some nights that she still has a hard time falling asleep and may have to take 75 mg.  HISTORY Ms. Malanga is a 49 year old female with a history of obstructive sleep apnea on CPAP. She returns today for follow-up. Her download indicates that she uses her machine 27 out of 30 days for compliance of 90%. She uses her machine greater than 4 hours 12 days for compliance of 40%. On average she uses her machine 3 hours and 38 minutes. Her residual AHI is 0.3 on 6 to 18 cm of water with EPR of 3. Her leak in the 95th percentile is 4.4 L/min. She reports that most nights she tries to get in bed between 9 and 10 PM. She states that she may not fall asleep until 1 or 2 AM. She states that when she falls asleep she may sleep  only 2 or 3 hours and that she is back up again. She states that she has been taking melatonin 10 mg at bedtime with little benefit. She states that several years ago she took Ambien. She states that she tries to do things like reading before bed to help stimulate good sleep but reports that has not been beneficial. Subsequently she is tired most days. She currently works 8-5. She returns today for an evaluation.   REVIEW OF SYSTEMS: Out of a complete 14 system review of symptoms, the patient complains only of the following symptoms, and all other reviewed systems are negative.  See HPI  ALLERGIES: Allergies  Allergen Reactions  . Sulfa Antibiotics Hives and Rash    HOME MEDICATIONS: Outpatient Medications Prior to Visit  Medication Sig Dispense Refill  . allopurinol (ZYLOPRIM) 100 MG tablet Take 100 mg by mouth daily.     Marland Kitchen amLODipine (NORVASC) 5 MG tablet Take 5 mg by mouth at bedtime.    . carvedilol (COREG) 25 MG tablet Take 1 tablet (25 mg total) by mouth 2 (two) times daily with a meal. 30 tablet 0  . chlorthalidone (HYGROTON) 25 MG tablet Take 25 mg by mouth daily.    . colchicine 0.6 MG tablet Take 0.6 mg by mouth daily as needed (gout).     Marland Kitchen levothyroxine (SYNTHROID) 25 MCG tablet  TAKE 1 TABLET BY MOUTH EVERY DAY 30 tablet 11  . metFORMIN (GLUCOPHAGE) 500 MG tablet TAKE 1 TABLET BY MOUTH TWICE A DAY 30 tablet 0  . Multiple Vitamins-Minerals (MULTIVITAMIN WITH MINERALS) tablet Take 1 tablet by mouth daily.    . RYBELSUS 7 MG TABS TAKE 1 TAB BY MOUTH DAILY. 30 tablet 0  . sertraline (ZOLOFT) 50 MG tablet Take 1 tablet (50 mg total) by mouth daily. 30 tablet 0  . topiramate (TOPAMAX) 25 MG tablet TAKE 1 TABLET BY MOUTH EVERY DAY 30 tablet 0  . traZODone (DESYREL) 50 MG tablet Take 1 tablet (50 mg total) by mouth at bedtime as needed for sleep. 90 tablet 1   No facility-administered medications prior to visit.    PAST MEDICAL HISTORY: Past Medical History:  Diagnosis Date  .  Abnormal Pap smear   . Allergy   . Anxiety    OCCAS PANIC ATTACKS  . Back pain   . Bilateral swelling of feet   . Blood transfusion 1990'S  . Blood transfusion without reported diagnosis   . BV (bacterial vaginosis)   . Cough    nonproductive cough last 2 weeks  . Depression 2010  . Fibroid 2011  . Gallbladder problem   . GERD (gastroesophageal reflux disease)    occasional  . H/O dysmenorrhea   . H/O varicella   . H/O: menorrhagia 2011  . Hypertension   . Hypokalemia    PAST HX  . Hypothyroid   . Increased BMI   . Kidney problem   . Kidney stones   . Obesity   . OSA (obstructive sleep apnea) 04/22/2013  . Ovarian cyst 2011  . Perimenopausal symptoms 07/15/2010  . Pre-diabetes   . Pregnancy induced hypertension   . Shortness of breath    ONLY WITH ANXIETY  . Sleep apnea    PT USES CPAP SOMETIMES - SETTING IS 3  . Vitamin D deficiency   . Weight loss 2010    PAST SURGICAL HISTORY: Past Surgical History:  Procedure Laterality Date  . C-SECTIONS X 2    . [place]  . CHOLECYSTECTOMY  1994  . COLONOSCOPY WITH PROPOFOL N/A 10/26/2018   Procedure: COLONOSCOPY WITH PROPOFOL;  Surgeon: Juanita Craver, MD;  [facility];  Service: Endoscopy;  Laterality: N/A;  . NEPHROLITHOTOMY  04/09/2011   Procedure: NEPHROLITHOTOMY PERCUTANEOUS;  Surgeon: Molli Hazard, MD;  [facility];  Service: Urology;  Laterality: Right;      . NEPHROLITHOTOMY  10/15/2011   Procedure: NEPHROLITHOTOMY PERCUTANEOUS;  Surgeon: Molli Hazard, MD;  [facility];  Service: Urology;  Laterality: Left;  . PERCUTANEOUS NEPHROSTOMY AROUND 1996  may 2013   right kidney  . REMOVAL OF STONES  10/15/2011   Procedure: REMOVAL OF STONES;  Surgeon: Molli Hazard, MD;  [facility];  Service: Urology;  Laterality: Left;  . TUBAL LIGATION  1999  . URETEROSCOPY  04/09/2011   Procedure: URETEROSCOPY;  Surgeon: Molli Hazard, MD;  [facility];   Service: Urology;  Laterality: Right;  . UTERINE ABLATION MARCH 2010  march 2011    FAMILY HISTORY: Family History  Problem Relation Age of Onset  . Diabetes Mother   . Hypertension Mother   . Obesity Mother   . Hypertension Father   . Diabetes Father   . Kidney disease Father   . Stroke Father   . Sleep apnea Father   . Diabetes Paternal Grandmother   .  Diabetes Maternal Grandmother   . Heart disease Maternal Grandmother     SOCIAL HISTORY: Social History   Socioeconomic History  . Marital status: Married    Spouse name: Iona Beard  . Number of children: 2  . Years of education: 15+  . Highest education level: Not on file  Occupational History  . Occupation: PROGRAM Theme park manager: [employer]  Tobacco Use  . Smoking status: Former Smoker    Packs/day: 0.25    Years: 15.00    Pack years: 3.75  . Smokeless tobacco: Never Used  Substance and Sexual Activity  . Alcohol use: No  . Drug use: No  . Sexual activity: Yes    Birth control/protection: Surgical    Comment: BTL 1999  Other Topics Concern  . Not on file  Social History Narrative   Patient is married Iona Beard) and lives at home with her husband and daughter.   Patient has two children.   Patient is working and attending college full-time.   Patient has a college education.   Patient is right-handed.   Patient drinks two cups of coffee daily and one cup of soda and tea daily.   Social Determinants of Health   Financial Resource Strain:   . Difficulty of Paying Living Expenses:   Food Insecurity:   . Worried About Charity fundraiser in the Last Year:   . Arboriculturist in the Last Year:   Transportation Needs:   . Film/video editor (Medical):   Marland Kitchen Lack of Transportation (Non-Medical):   Physical Activity:   . Days of Exercise per Week:   . Minutes of Exercise per Session:   Stress:   . Feeling of Stress :   Social Connections:   . Frequency of Communication with Friends and Family:   .  Frequency of Social Gatherings with Friends and Family:   . Attends Religious Services:   . Active Member of Clubs or Organizations:   . Attends Archivist Meetings:   Marland Kitchen Marital Status:   Intimate Partner Violence:   . Fear of Current or Ex-Partner:   . Emotionally Abused:   Marland Kitchen Physically Abused:   . Sexually Abused:       PHYSICAL EXAM Generalized: Well developed, in no acute distress   Neurological examination  Mentation: Alert oriented to time, place, history taking. Follows all commands speech and language fluent Cranial nerve II-XII:Extraocular movements were full. Facial symmetry noted. uvula tongue midline. Head turning and shoulder shrug  were normal and symmetric. Motor: Good strength throughout subjectively per patient Sensory: Sensory testing is intact to soft touch on all 4 extremities subjectively per patient Coordination: Cerebellar testing reveals good finger-nose-finger  Gait and station: Patient is able to stand from a seated position. gait is normal.  Reflexes: UTA  DIAGNOSTIC DATA (LABS, IMAGING, TESTING) - I reviewed patient records, labs, notes, testing and imaging myself where available.  Lab Results  Component Value Date   WBC 7.7 05/24/2019   HGB 13.5 05/24/2019   HCT 39.8 05/24/2019   MCV 91.9 05/24/2019   PLT 294 05/24/2019      Component Value Date/Time   NA 139 05/24/2019 1727   NA 138 11/29/2018 0815   K 3.5 05/24/2019 1727   CL 101 05/24/2019 1727   CO2 26 05/24/2019 1727   GLUCOSE 93 05/24/2019 1727   BUN 24 (H) 05/24/2019 1727   BUN 17 11/29/2018 0815   CREATININE 1.52 (H) 05/24/2019 1727  CREATININE 1.14 (H) 05/31/2012 1653   CALCIUM 9.8 05/24/2019 1727   PROT 7.4 05/24/2019 1727   PROT 6.6 11/29/2018 0815   ALBUMIN 3.9 05/24/2019 1727   ALBUMIN 4.2 11/29/2018 0815   AST 19 05/24/2019 1727   ALT 24 05/24/2019 1727   ALKPHOS 61 05/24/2019 1727   BILITOT 0.7 05/24/2019 1727   BILITOT 0.8 11/29/2018 0815   GFRNONAA 40  (L) 05/24/2019 1727   GFRAA 47 (L) 05/24/2019 1727   Lab Results  Component Value Date   CHOL 220 (H) 11/29/2018   HDL 46 11/29/2018   LDLCALC 129 (H) 11/29/2018   TRIG 253 (H) 11/29/2018   CHOLHDL 4.8 08/20/2010   Lab Results  Component Value Date   HGBA1C 5.7 (H) 11/29/2018   Lab Results  Component Value Date   VITAMINB12 615 07/28/2018   Lab Results  Component Value Date   TSH 3.250 11/29/2018      ASSESSMENT AND PLAN 49 y.o. year old female  has a past medical history of Abnormal Pap smear, Allergy, Anxiety, Back pain, Bilateral swelling of feet, Blood transfusion (1990'S), Blood transfusion without reported diagnosis, BV (bacterial vaginosis), Cough, Depression (2010), Fibroid (2011), Gallbladder problem, GERD (gastroesophageal reflux disease), H/O dysmenorrhea, H/O varicella, H/O: menorrhagia (2011), Hypertension, Hypokalemia, Hypothyroid, Increased BMI, Kidney problem, Kidney stones, Obesity, OSA (obstructive sleep apnea) (04/22/2013), Ovarian cyst (2011), Perimenopausal symptoms (07/15/2010), Pre-diabetes, Pregnancy induced hypertension, Shortness of breath, Sleep apnea, Vitamin D deficiency, and Weight loss (2010). here with:  OSA on CPAP  . CPAP compliance excellent . Residual AHI is good . Encouraged patient to continue using CPAP nightly and > 4 hours each night  Insomnia    Advised that she could take trazodone 1 hour before her planned bedtime.  If this is not helpful and she continues to have to take 75 mg we can adjust the prescription to reflect that. . F/U in 1 year or sooner if needed  I spent 20 minutes of face-to-face and non-face-to-face time with patient.  This included previsit chart review, lab review, study review, order entry, electronic health record documentation, patient education.  Ward Givens, MSN, NP-C 07/26/2019, 2:00 PM [facility] [address], [place], [place] [address] [phone]

## 2019-07-30 ENCOUNTER — Encounter: Payer: Self-pay | Admitting: Adult Health

## 2019-07-31 ENCOUNTER — Other Ambulatory Visit: Payer: Self-pay | Admitting: Neurology

## 2019-08-02 MED ORDER — TRAZODONE HCL 50 MG PO TABS: 75.0000 mg | ORAL_TABLET | Freq: Every evening | ORAL | 1 refills | Status: DC | PRN

## 2019-08-10 ENCOUNTER — Ambulatory Visit: Payer: 59 | Admitting: Internal Medicine

## 2019-08-10 ENCOUNTER — Other Ambulatory Visit: Payer: Self-pay

## 2019-08-10 ENCOUNTER — Encounter: Payer: Self-pay | Admitting: Internal Medicine

## 2019-08-10 VITALS — BP 140/98 | HR 60 | Temp 98.2°F | Ht 64.0 in | Wt 270.0 lb

## 2019-08-10 DIAGNOSIS — E038 Other specified hypothyroidism: Secondary | ICD-10-CM

## 2019-08-10 DIAGNOSIS — E7849 Other hyperlipidemia: Secondary | ICD-10-CM

## 2019-08-10 DIAGNOSIS — Z9989 Dependence on other enabling machines and devices: Secondary | ICD-10-CM

## 2019-08-10 DIAGNOSIS — G4733 Obstructive sleep apnea (adult) (pediatric): Secondary | ICD-10-CM

## 2019-08-10 DIAGNOSIS — E1121 Type 2 diabetes mellitus with diabetic nephropathy: Secondary | ICD-10-CM | POA: Diagnosis not present

## 2019-08-10 DIAGNOSIS — N1831 Chronic kidney disease, stage 3a: Secondary | ICD-10-CM | POA: Diagnosis not present

## 2019-08-10 DIAGNOSIS — E559 Vitamin D deficiency, unspecified: Secondary | ICD-10-CM

## 2019-08-10 DIAGNOSIS — R7303 Prediabetes: Secondary | ICD-10-CM | POA: Diagnosis not present

## 2019-08-10 DIAGNOSIS — I10 Essential (primary) hypertension: Secondary | ICD-10-CM | POA: Diagnosis not present

## 2019-08-10 DIAGNOSIS — M1A9XX0 Chronic gout, unspecified, without tophus (tophi): Secondary | ICD-10-CM

## 2019-08-10 DIAGNOSIS — F339 Major depressive disorder, recurrent, unspecified: Secondary | ICD-10-CM

## 2019-08-10 LAB — POCT GLYCOSYLATED HEMOGLOBIN (HGB A1C): Hemoglobin A1C: 5.1 % (ref 4.0–5.6)

## 2019-08-10 MED ORDER — AMLODIPINE BESYLATE 10 MG PO TABS: 10.0000 mg | ORAL_TABLET | Freq: Every day | ORAL | 1 refills | Status: DC

## 2019-08-10 NOTE — Progress Notes (Signed)
New Patient Office Visit     This visit occurred during the SARS-CoV-2 public health emergency.  Safety protocols were in place, including screening questions prior to the visit, additional usage of staff PPE, and extensive cleaning of exam room while observing appropriate contact time as indicated for disinfecting solutions.    CC/Reason for Visit:   Establish care, discuss chronic medical conditionsMedication concerns, Previous PCP: Dr.Veita Criss Rosales Last Visit: Unknown  HPI: Robyn [name] is a 49 y.o. female who is coming in today for the above mentioned reasons. Past Medical History is significant for: Morbid obesity, hypertension that has not been well controlled, obstructive sleep apnea on CPAP, type 2 diabetes, hypothyroidism, chronic kidney disease stage III, chronic gout and depression.  She works from home for [employer].  She has been married for 22 years.  She has 2 grown children.  She has allergies to sulfa drugs which cause hives, her past surgical history significant for C-section x2 and surgery for nephrolithiasis, she is a former smoker of about a pack a week who quit 7 years ago, she drinks alcohol occasionally.  Family history is most significant for a mother with coronary artery disease and diabetes as well as a father with diabetes and end-stage renal disease on hemodialysis.  She has no acute complaints today.  She has received both of her Covid vaccines.  She is interested in seeing if she can stop her diabetes medication and she also has concerns about elevated blood pressures.   Past Medical/Surgical History: Past Medical History:  Diagnosis Date  . Abnormal Pap smear   . Allergy   . Anxiety    OCCAS PANIC ATTACKS  . Back pain   . Bilateral swelling of feet   . Blood transfusion 1990'S  . Blood transfusion without reported diagnosis   . BV (bacterial vaginosis)   . Cough    nonproductive cough last 2 weeks  . Depression 2010  . Fibroid 2011  .  Gallbladder problem   . GERD (gastroesophageal reflux disease)    occasional  . H/O dysmenorrhea   . H/O varicella   . H/O: menorrhagia 2011  . Hypertension   . Hypokalemia    PAST HX  . Hypothyroid   . Increased BMI   . Kidney problem   . Kidney stones   . Obesity   . OSA (obstructive sleep apnea) 04/22/2013  . Ovarian cyst 2011  . Perimenopausal symptoms 07/15/2010  . Pre-diabetes   . Pregnancy induced hypertension   . Shortness of breath    ONLY WITH ANXIETY  . Sleep apnea    PT USES CPAP SOMETIMES - SETTING IS 3  . Vitamin D deficiency   . Weight loss 2010    Past Surgical History:  Procedure Laterality Date  . C-SECTIONS X 2    . [place]  . CHOLECYSTECTOMY  1994  . COLONOSCOPY WITH PROPOFOL N/A 10/26/2018   Procedure: COLONOSCOPY WITH PROPOFOL;  Surgeon: Juanita Craver, MD;  Location: [facility];  Service: Endoscopy;  Laterality: N/A;  . NEPHROLITHOTOMY  04/09/2011   Procedure: NEPHROLITHOTOMY PERCUTANEOUS;  Surgeon: Molli Hazard, MD;  Location: [facility];  Service: Urology;  Laterality: Right;      . NEPHROLITHOTOMY  10/15/2011   Procedure: NEPHROLITHOTOMY PERCUTANEOUS;  Surgeon: Molli Hazard, MD;  Location: [facility];  Service: Urology;  Laterality: Left;  . PERCUTANEOUS NEPHROSTOMY AROUND 1996  may 2013   right kidney  . REMOVAL OF STONES  10/15/2011   Procedure: REMOVAL OF STONES;  Surgeon: Molli Hazard, MD;  [facility];  Service: Urology;  Laterality: Left;  . TUBAL LIGATION  1999  . URETEROSCOPY  04/09/2011   Procedure: URETEROSCOPY;  Surgeon: Molli Hazard, MD;  [facility];  Service: Urology;  Laterality: Right;  . UTERINE ABLATION MARCH 2010  march 2011    Social History:  reports that she has quit smoking. She has a 3.75 pack-year smoking history. She has never used smokeless tobacco. She reports current alcohol use. She reports that she does not use drugs.  Allergies: Allergies  Allergen  Reactions  . Sulfa Antibiotics Hives and Rash    Family History:  Family History  Problem Relation Age of Onset  . Diabetes Mother   . Hypertension Mother   . Obesity Mother   . Hypertension Father   . Diabetes Father   . Kidney disease Father   . Stroke Father   . Sleep apnea Father   . Diabetes Paternal Grandmother   . Diabetes Maternal Grandmother   . Heart disease Maternal Grandmother      Current Outpatient Medications:  .  allopurinol (ZYLOPRIM) 100 MG tablet, Take 100 mg by mouth daily. , Disp: , Rfl:  .  carvedilol (COREG) 25 MG tablet, Take 1 tablet (25 mg total) by mouth 2 (two) times daily with a meal., Disp: 30 tablet, Rfl: 0 .  chlorthalidone (HYGROTON) 25 MG tablet, Take 25 mg by mouth daily., Disp: , Rfl:  .  colchicine 0.6 MG tablet, Take 0.6 mg by mouth daily as needed (gout). , Disp: , Rfl:  .  levothyroxine (SYNTHROID) 25 MCG tablet, TAKE 1 TABLET BY MOUTH EVERY DAY, Disp: 30 tablet, Rfl: 11 .  Multiple Vitamins-Minerals (MULTIVITAMIN WITH MINERALS) tablet, Take 1 tablet by mouth daily., Disp: , Rfl:  .  sertraline (ZOLOFT) 50 MG tablet, Take 1 tablet (50 mg total) by mouth daily., Disp: 30 tablet, Rfl: 0 .  traZODone (DESYREL) 50 MG tablet, Take 1.5 tablets (75 mg total) by mouth at bedtime as needed for sleep., Disp: 135 tablet, Rfl: 1 .  amLODipine (NORVASC) 10 MG tablet, Take 1 tablet (10 mg total) by mouth daily., Disp: 90 tablet, Rfl: 1  Review of Systems:  Constitutional: Denies fever, chills, diaphoresis, appetite change and fatigue.  HEENT: Denies photophobia, eye pain, redness, hearing loss, ear pain, congestion, sore throat, rhinorrhea, sneezing, mouth sores, trouble swallowing, neck pain, neck stiffness and tinnitus.   Respiratory: Denies SOB, DOE, cough, chest tightness,  and wheezing.   Cardiovascular: Denies chest pain, palpitations and leg swelling.  Gastrointestinal: Denies nausea, vomiting, abdominal pain, diarrhea, constipation, blood in  stool and abdominal distention.  Genitourinary: Denies dysuria, urgency, frequency, hematuria, flank pain and difficulty urinating.  Endocrine: Denies: hot or cold intolerance, sweats, changes in hair or nails, polyuria, polydipsia. Musculoskeletal: Denies myalgias, back pain, joint swelling, arthralgias and gait problem.  Skin: Denies pallor, rash and wound.  Neurological: Denies dizziness, seizures, syncope, weakness, light-headedness, numbness and headaches.  Hematological: Denies adenopathy. Easy bruising, personal or family bleeding history  Psychiatric/Behavioral: Denies suicidal ideation, mood changes, confusion, nervousness, sleep disturbance and agitation    Physical Exam: Vitals:   08/10/19 1403  BP: (!) 140/98  Pulse: 60  Temp: 98.2 F (36.8 C)  TempSrc: Oral  SpO2: 96%  Weight: 270 lb (122.5 kg)  Height: 5\' 4"  (1.626 m)   Body mass index is 46.35 kg/m.  Constitutional: NAD, calm, comfortable, obese Eyes: PERRL,  lids and conjunctivae normal ENMT: Mucous membranes are moist. Respiratory: clear to auscultation bilaterally, no wheezing, no crackles. Normal respiratory effort. No accessory muscle use.  Cardiovascular: Regular rate and rhythm, no murmurs / rubs / gallops. No extremity edema.   Neurologic: Grossly intact and nonfocal Psychiatric: Normal judgment and insight. Alert and oriented x 3. Normal mood.    Impression and Plan:  Type 2 diabetes mellitus with stage 3a chronic kidney disease, without long-term current use of insulin ([place])  -Very well controlled with an A1c of 5.2 today. -Given her wishes, I have agreed to attempt a trial off of Rybelsus.  She will continue with lifestyle modifications and return in 3 months for follow-up stop  Stage 3a chronic kidney disease -Followed by Dr. Posey Pronto, her baseline creatinine is around 1.5 to stop  Essential hypertension  -Not well controlled on this correlates with elevated blood pressure measurements at  home. -Continue chlorthalidone 25 mg, increase amlodipine from 5 to 10 mg. -She will do ambulatory measurements and return in 3 months for follow-up.  OSA on CPAP -Noted  Other hyperlipidemia -Last LDL was 146 in February, not at goal. -She is not on a statin. -Recheck lipid panel when she returns in 3 months.  If above goal of 70, will need to consider addition of statin.  Vitamin D deficiency -Recheck levels when she returns for physical  Other specified hypothyroidism -Last TSH was normal at 2.270 in February, continue current levothyroxine dose.  Morbid obesity ([place]) -Discussed healthy lifestyle, including increased physical activity and better food choices to promote weight loss.  Chronic gout without tophus, unspecified cause, unspecified site -On daily allopurinol.  Depression, recurrent ([place])   Office Visit from 08/10/2019 in [place] at [place]  PHQ-9 Total Score 4     -Mood is stable on daily Zoloft and CBT sessions.     Patient Instructions  -Nice seeing you today!!  -Stop Rybelsus.  -Increase amlodipine to 10 mg daily.  -Schedule follow up in 3 months for your physical. Please come in fasting that day.     Lelon Frohlich, MD [facility]

## 2019-08-10 NOTE — Patient Instructions (Signed)
-  Nice seeing you today!!  -Stop Rybelsus.  -Increase amlodipine to 10 mg daily.  -Schedule follow up in 3 months for your physical. Please come in fasting that day.

## 2019-08-29 ENCOUNTER — Encounter: Payer: Self-pay | Admitting: Internal Medicine

## 2019-08-29 DIAGNOSIS — F32A Depression, unspecified: Secondary | ICD-10-CM

## 2019-08-30 MED ORDER — SERTRALINE HCL 50 MG PO TABS: 50.0000 mg | ORAL_TABLET | Freq: Every day | ORAL | 0 refills | Status: DC

## 2019-10-06 ENCOUNTER — Other Ambulatory Visit: Payer: Self-pay | Admitting: Neurology

## 2019-11-17 ENCOUNTER — Other Ambulatory Visit: Payer: Self-pay

## 2019-11-17 ENCOUNTER — Ambulatory Visit (INDEPENDENT_AMBULATORY_CARE_PROVIDER_SITE_OTHER): Payer: 59 | Admitting: Internal Medicine

## 2019-11-17 VITALS — BP 138/68 | HR 79 | Temp 98.2°F | Ht 64.0 in | Wt 285.7 lb

## 2019-11-17 DIAGNOSIS — G4733 Obstructive sleep apnea (adult) (pediatric): Secondary | ICD-10-CM

## 2019-11-17 DIAGNOSIS — I10 Essential (primary) hypertension: Secondary | ICD-10-CM

## 2019-11-17 DIAGNOSIS — Z0000 Encounter for general adult medical examination without abnormal findings: Secondary | ICD-10-CM | POA: Diagnosis not present

## 2019-11-17 DIAGNOSIS — E038 Other specified hypothyroidism: Secondary | ICD-10-CM

## 2019-11-17 DIAGNOSIS — E7849 Other hyperlipidemia: Secondary | ICD-10-CM | POA: Diagnosis not present

## 2019-11-17 DIAGNOSIS — E559 Vitamin D deficiency, unspecified: Secondary | ICD-10-CM

## 2019-11-17 DIAGNOSIS — N1831 Chronic kidney disease, stage 3a: Secondary | ICD-10-CM

## 2019-11-17 DIAGNOSIS — E1122 Type 2 diabetes mellitus with diabetic chronic kidney disease: Secondary | ICD-10-CM | POA: Diagnosis not present

## 2019-11-17 DIAGNOSIS — Z9989 Dependence on other enabling machines and devices: Secondary | ICD-10-CM

## 2019-11-17 NOTE — Patient Instructions (Signed)
-  Nice seeing you today!!  -Lab work today; will notify you once results are available.  -Schedule follow up in 3 months. 

## 2019-11-17 NOTE — Progress Notes (Signed)
Established Patient Office Visit     This visit occurred during the SARS-CoV-2 public health emergency.  Safety protocols were in place, including screening questions prior to the visit, additional usage of staff PPE, and extensive cleaning of exam room while observing appropriate contact time as indicated for disinfecting solutions.    CC/Reason for Visit: Annual preventive exam, follow-up chronic medical conditions  HPI: Robyn [name] is a 49 y.o. female who is coming in today for the above mentioned reasons. Past Medical History is significant for:  Morbid obesity, hypertension that has not been well controlled, obstructive sleep apnea on CPAP, type 2 diabetes, hypothyroidism, chronic kidney disease stage III, chronic gout and depression.  At her last visit she was noticed to have elevated blood pressures and hence her amlodipine was increased from 5 to 10 mg.  Most recent blood pressures at home have been 129/84, 132/84.  Also at last visit she had an A1c of 5.2 and she elected to come off of Rybelsus.  I agreed to a 21-month trial.  Since stopping Rybelsus she has noticed a significant weight gain of 15 pounds.  She states that she is hungry nonstop.  She has been exercising 15 minutes on a stationary bike.  She has had her Covid booster and her flu vaccine recently.  She has routine eye care, all cancer screenings are up-to-date.   Past Medical/Surgical History: Past Medical History:  Diagnosis Date  . Abnormal Pap smear   . Allergy   . Anxiety    OCCAS PANIC ATTACKS  . Back pain   . Bilateral swelling of feet   . Blood transfusion 1990'S  . Blood transfusion without reported diagnosis   . BV (bacterial vaginosis)   . Cough    nonproductive cough last 2 weeks  . Depression 2010  . Fibroid 2011  . Gallbladder problem   . GERD (gastroesophageal reflux disease)    occasional  . H/O dysmenorrhea   . H/O varicella   . H/O: menorrhagia 2011  . Hypertension   .  Hypokalemia    PAST HX  . Hypothyroid   . Increased BMI   . Kidney problem   . Kidney stones   . Obesity   . OSA (obstructive sleep apnea) 04/22/2013  . Ovarian cyst 2011  . Perimenopausal symptoms 07/15/2010  . Pre-diabetes   . Pregnancy induced hypertension   . Shortness of breath    ONLY WITH ANXIETY  . Sleep apnea    PT USES CPAP SOMETIMES - SETTING IS 3  . Vitamin D deficiency   . Weight loss 2010    Past Surgical History:  Procedure Laterality Date  . C-SECTIONS X 2    . [place]  . CHOLECYSTECTOMY  1994  . COLONOSCOPY WITH PROPOFOL N/A 10/26/2018   Procedure: COLONOSCOPY WITH PROPOFOL;  Surgeon: Juanita Craver, MD;  [facility];  Service: Endoscopy;  Laterality: N/A;  . NEPHROLITHOTOMY  04/09/2011   Procedure: NEPHROLITHOTOMY PERCUTANEOUS;  Surgeon: Molli Hazard, MD;  [facility];  Service: Urology;  Laterality: Right;      . NEPHROLITHOTOMY  10/15/2011   Procedure: NEPHROLITHOTOMY PERCUTANEOUS;  Surgeon: Molli Hazard, MD;  [facility];  Service: Urology;  Laterality: Left;  . PERCUTANEOUS NEPHROSTOMY AROUND 1996  may 2013   right kidney  . REMOVAL OF STONES  10/15/2011   Procedure: REMOVAL OF STONES;  Surgeon: Molli Hazard, MD;  [facility];  Service:  Urology;  Laterality: Left;  . TUBAL LIGATION  1999  . URETEROSCOPY  04/09/2011   Procedure: URETEROSCOPY;  Surgeon: Molli Hazard, MD;  [facility];  Service: Urology;  Laterality: Right;  . UTERINE ABLATION MARCH 2010  march 2011    Social History:  reports that she has quit smoking. She has a 3.75 pack-year smoking history. She has never used smokeless tobacco. She reports current alcohol use. She reports that she does not use drugs.  Allergies: Allergies  Allergen Reactions  . Sulfa Antibiotics Hives and Rash    Family History:  Family History  Problem Relation Age of Onset  . Diabetes Mother   . Hypertension Mother   .  Obesity Mother   . Hypertension Father   . Diabetes Father   . Kidney disease Father   . Stroke Father   . Sleep apnea Father   . Diabetes Paternal Grandmother   . Diabetes Maternal Grandmother   . Heart disease Maternal Grandmother      Current Outpatient Medications:  .  allopurinol (ZYLOPRIM) 100 MG tablet, Take 100 mg by mouth daily. , Disp: , Rfl:  .  amLODipine (NORVASC) 10 MG tablet, Take 1 tablet (10 mg total) by mouth daily., Disp: 90 tablet, Rfl: 1 .  carvedilol (COREG) 25 MG tablet, Take 1 tablet (25 mg total) by mouth 2 (two) times daily with a meal., Disp: 30 tablet, Rfl: 0 .  chlorthalidone (HYGROTON) 25 MG tablet, Take 25 mg by mouth daily., Disp: , Rfl:  .  colchicine 0.6 MG tablet, Take 0.6 mg by mouth daily as needed (gout). , Disp: , Rfl:  .  levothyroxine (SYNTHROID) 25 MCG tablet, TAKE 1 TABLET BY MOUTH EVERY DAY, Disp: 30 tablet, Rfl: 11 .  Multiple Vitamins-Minerals (MULTIVITAMIN WITH MINERALS) tablet, Take 1 tablet by mouth daily., Disp: , Rfl:  .  sertraline (ZOLOFT) 50 MG tablet, Take 1 tablet (50 mg total) by mouth daily., Disp: 90 tablet, Rfl: 0 .  traZODone (DESYREL) 50 MG tablet, Take 1.5 tablets (75 mg total) by mouth at bedtime as needed for sleep., Disp: 135 tablet, Rfl: 1  Review of Systems:  Constitutional: Denies fever, chills, diaphoresis, appetite change and fatigue.  HEENT: Denies photophobia, eye pain, redness, hearing loss, ear pain, congestion, sore throat, rhinorrhea, sneezing, mouth sores, trouble swallowing, neck pain, neck stiffness and tinnitus.   Respiratory: Denies SOB, DOE, cough, chest tightness,  and wheezing.   Cardiovascular: Denies chest pain, palpitations and leg swelling.  Gastrointestinal: Denies nausea, vomiting, abdominal pain, diarrhea, constipation, blood in stool and abdominal distention.  Genitourinary: Denies dysuria, urgency, frequency, hematuria, flank pain and difficulty urinating.  Endocrine: Denies: hot or cold  intolerance, sweats, changes in hair or nails, polyuria, polydipsia. Musculoskeletal: Denies myalgias, back pain, joint swelling, arthralgias and gait problem.  Skin: Denies pallor, rash and wound.  Neurological: Denies dizziness, seizures, syncope, weakness, light-headedness, numbness and headaches.  Hematological: Denies adenopathy. Easy bruising, personal or family bleeding history  Psychiatric/Behavioral: Denies suicidal ideation, mood changes, confusion, nervousness, sleep disturbance and agitation    Physical Exam: Vitals:   11/17/19 0838  BP: 138/68  Pulse: 79  Temp: 98.2 F (36.8 C)  TempSrc: Oral  SpO2: 94%  Weight: 285 lb 11.2 oz (129.6 kg)  Height: 5\' 4"  (1.626 m)    Body mass index is 49.04 kg/m.   Constitutional: NAD, calm, comfortable Eyes: PERRL, lids and conjunctivae normal ENMT: Mucous membranes are moist. Posterior pharynx clear of any exudate or lesions.  Normal dentition. Tympanic membrane is pearly white, no erythema or bulging. Neck: normal, supple, no masses, no thyromegaly Respiratory: clear to auscultation bilaterally, no wheezing, no crackles. Normal respiratory effort. No accessory muscle use.  Cardiovascular: Regular rate and rhythm, no murmurs / rubs / gallops. No extremity edema. 2+ pedal pulses.  Abdomen: no tenderness, no masses palpated. No hepatosplenomegaly. Bowel sounds positive.  Musculoskeletal: no clubbing / cyanosis. No joint deformity upper and lower extremities. Good ROM, no contractures. Normal muscle tone.  Skin: no rashes, lesions, ulcers. No induration Neurologic: CN 2-12 grossly intact. Sensation intact, DTR normal. Strength 5/5 in all 4.  Psychiatric: Normal judgment and insight. Alert and oriented x 3. Normal mood.    Impression and Plan:  Encounter for preventive health examination  -She has routine eye and dental care. -All immunizations are up-to-date and age-appropriate. -Screening labs today. -Healthy lifestyle  discussed in detail. -She had mammogram and Pap smear done earlier this year by GYN. -She had a colonoscopy in 2020 and is a 5-year callback.  Vitamin D deficiency  - Plan: VITAMIN D 25 Hydroxy (Vit-D Deficiency, Fractures)  Other hyperlipidemia  - Plan: Lipid panel -Suspect will need statin.  Goal LDL less than 70 due to history of diabetes.  Type 2 diabetes mellitus with stage 3a chronic kidney disease, without long-term current use of insulin ([facility]) -Check A1c today, was last 5.2 in August 2021. -She is currently off all medication.  Morbid obesity ([place]) -Discussed healthy lifestyle, including increased physical activity and better food choices to promote weight loss. -Suspect increased weight gain due to discontinuing GLP-1. -Check A1c today, pending results can consider resuming Rybelsus.  OSA on CPAP -Noted  Other specified hypothyroidism  - Plan: TSH -For now, continue current levothyroxine dosing.  Primary hypertension -Well-controlled on increased amlodipine dosing. -She is also on chlorthalidone 25 mg daily.  Stage 3a chronic kidney disease ([facility]) -Last creatinine was 1.520, recheck kidney function today.    Patient Instructions  -Nice seeing you today!!  -Lab work today; will notify you once results are available.  -Schedule follow up in 3 months.     Lelon Frohlich, MD [facility]

## 2019-11-18 ENCOUNTER — Encounter: Payer: Self-pay | Admitting: Internal Medicine

## 2019-11-18 ENCOUNTER — Other Ambulatory Visit: Payer: Self-pay | Admitting: Internal Medicine

## 2019-11-18 DIAGNOSIS — E785 Hyperlipidemia, unspecified: Secondary | ICD-10-CM | POA: Insufficient documentation

## 2019-11-18 DIAGNOSIS — E1169 Type 2 diabetes mellitus with other specified complication: Secondary | ICD-10-CM

## 2019-11-18 DIAGNOSIS — E1122 Type 2 diabetes mellitus with diabetic chronic kidney disease: Secondary | ICD-10-CM

## 2019-11-18 DIAGNOSIS — E559 Vitamin D deficiency, unspecified: Secondary | ICD-10-CM

## 2019-11-18 LAB — CBC WITH DIFFERENTIAL/PLATELET
Absolute Monocytes: 706 cells/uL (ref 200–950)
Basophils Absolute: 42 cells/uL (ref 0–200)
Basophils Relative: 0.5 %
Eosinophils Absolute: 428 cells/uL (ref 15–500)
Eosinophils Relative: 5.1 %
HCT: 39.3 % (ref 35.0–45.0)
Hemoglobin: 14 g/dL (ref 11.7–15.5)
Lymphs Abs: 1537 cells/uL (ref 850–3900)
MCH: 33.8 pg — ABNORMAL HIGH (ref 27.0–33.0)
MCHC: 35.6 g/dL (ref 32.0–36.0)
MCV: 94.9 fL (ref 80.0–100.0)
MPV: 9.2 fL (ref 7.5–12.5)
Monocytes Relative: 8.4 %
Neutro Abs: 5687 cells/uL (ref 1500–7800)
Neutrophils Relative %: 67.7 %
Platelets: 257 10*3/uL (ref 140–400)
RBC: 4.14 10*6/uL (ref 3.80–5.10)
RDW: 13.6 % (ref 11.0–15.0)
Total Lymphocyte: 18.3 %
WBC: 8.4 10*3/uL (ref 3.8–10.8)

## 2019-11-18 LAB — HEMOGLOBIN A1C
Hgb A1c MFr Bld: 6.2 % of total Hgb — ABNORMAL HIGH (ref <5.7)
Mean Plasma Glucose: 131 (calc)
eAG (mmol/L): 7.3 (calc)

## 2019-11-18 LAB — COMPREHENSIVE METABOLIC PANEL
AG Ratio: 1.6 (calc) (ref 1.0–2.5)
ALT: 24 U/L (ref 6–29)
AST: 15 U/L (ref 10–35)
Albumin: 4.2 g/dL (ref 3.6–5.1)
Alkaline phosphatase (APISO): 87 U/L (ref 31–125)
BUN/Creatinine Ratio: 15 (calc) (ref 6–22)
BUN: 24 mg/dL (ref 7–25)
CO2: 31 mmol/L (ref 20–32)
Calcium: 9.9 mg/dL (ref 8.6–10.2)
Chloride: 95 mmol/L — ABNORMAL LOW (ref 98–110)
Creat: 1.57 mg/dL — ABNORMAL HIGH (ref 0.50–1.10)
Globulin: 2.6 g/dL (calc) (ref 1.9–3.7)
Glucose, Bld: 169 mg/dL — ABNORMAL HIGH (ref 65–99)
Potassium: 3.8 mmol/L (ref 3.5–5.3)
Sodium: 136 mmol/L (ref 135–146)
Total Bilirubin: 0.8 mg/dL (ref 0.2–1.2)
Total Protein: 6.8 g/dL (ref 6.1–8.1)

## 2019-11-18 LAB — TSH: TSH: 4.35 mIU/L

## 2019-11-18 LAB — LIPID PANEL
Cholesterol: 290 mg/dL — ABNORMAL HIGH (ref <200)
HDL: 58 mg/dL (ref >=50)
LDL Cholesterol (Calc): 176 mg/dL (calc) — ABNORMAL HIGH
Non-HDL Cholesterol (Calc): 232 mg/dL (calc) — ABNORMAL HIGH (ref <130)
Total CHOL/HDL Ratio: 5 (calc) — ABNORMAL HIGH (ref <5.0)
Triglycerides: 332 mg/dL — ABNORMAL HIGH (ref <150)

## 2019-11-18 LAB — VITAMIN D 25 HYDROXY (VIT D DEFICIENCY, FRACTURES): Vit D, 25-Hydroxy: 30 ng/mL (ref 30–100)

## 2019-11-18 LAB — VITAMIN B12: Vitamin B-12: 460 pg/mL (ref 200–1100)

## 2019-11-18 MED ORDER — RYBELSUS 7 MG PO TABS: 7.0000 mg | ORAL_TABLET | Freq: Every day | ORAL | 1 refills | Status: DC

## 2019-11-18 MED ORDER — ATORVASTATIN CALCIUM 80 MG PO TABS: 80.0000 mg | ORAL_TABLET | Freq: Every day | ORAL | 1 refills | Status: DC

## 2019-11-18 MED ORDER — VITAMIN D (ERGOCALCIFEROL) 1.25 MG (50000 UNIT) PO CAPS: 50000.0000 [IU] | ORAL_CAPSULE | ORAL | 0 refills | Status: DC

## 2019-11-21 ENCOUNTER — Telehealth: Payer: Self-pay | Admitting: Internal Medicine

## 2019-11-21 NOTE — Telephone Encounter (Signed)
Patient returned Rachel's call for lab results

## 2019-11-22 ENCOUNTER — Telehealth: Payer: Self-pay | Admitting: Internal Medicine

## 2019-11-22 ENCOUNTER — Other Ambulatory Visit: Payer: Self-pay | Admitting: Internal Medicine

## 2019-11-22 DIAGNOSIS — E559 Vitamin D deficiency, unspecified: Secondary | ICD-10-CM

## 2019-11-22 DIAGNOSIS — E7849 Other hyperlipidemia: Secondary | ICD-10-CM

## 2019-11-22 NOTE — Telephone Encounter (Signed)
Pt returned the call to the office. 

## 2019-11-22 NOTE — Telephone Encounter (Signed)
Patient is aware See lab result note 

## 2019-11-23 NOTE — Telephone Encounter (Signed)
See lab result note.

## 2019-12-21 ENCOUNTER — Other Ambulatory Visit: Payer: Self-pay | Admitting: Internal Medicine

## 2019-12-21 DIAGNOSIS — F419 Anxiety disorder, unspecified: Secondary | ICD-10-CM

## 2020-01-07 ENCOUNTER — Encounter (HOSPITAL_COMMUNITY): Payer: Self-pay

## 2020-01-07 ENCOUNTER — Ambulatory Visit (HOSPITAL_COMMUNITY)
Admission: EM | Admit: 2020-01-07 | Discharge: 2020-01-07 | Disposition: A | Discharge status: Home / Self Care | Payer: 59 | Attending: Emergency Medicine | Admitting: Emergency Medicine

## 2020-01-07 ENCOUNTER — Other Ambulatory Visit: Payer: Self-pay

## 2020-01-07 DIAGNOSIS — U071 COVID-19: Secondary | ICD-10-CM | POA: Diagnosis present

## 2020-01-07 DIAGNOSIS — Z20822 Contact with and (suspected) exposure to covid-19: Secondary | ICD-10-CM | POA: Insufficient documentation

## 2020-01-07 DIAGNOSIS — J069 Acute upper respiratory infection, unspecified: Secondary | ICD-10-CM | POA: Insufficient documentation

## 2020-01-07 LAB — RESP PANEL BY RT-PCR (FLU A&B, COVID) ARPGX2
Influenza A by PCR: NEGATIVE
Influenza B by PCR: NEGATIVE
SARS Coronavirus 2 by RT PCR: POSITIVE — AB

## 2020-01-07 MED ORDER — ONDANSETRON 8 MG PO TBDP
ORAL_TABLET | ORAL | 0 refills | Status: DC
Start: 2020-01-07 — End: 2020-05-22

## 2020-01-07 MED ORDER — FLUTICASONE PROPIONATE 50 MCG/ACT NA SUSP
2.0000 | Freq: Every day | NASAL | 0 refills | Status: DC
Start: 2020-01-07 — End: 2020-08-31

## 2020-01-07 NOTE — Discharge Instructions (Addendum)
Someone will contact you if your Covid or flu is positive.  I will call in prescription Tamiflu if your flu is positive.  Zofran as needed for nausea.  Take at 1000 mg of Tylenol 3 or 4 times a day as needed for body aches, headaches.  Electrolyte containing fluids such as Pedialyte and Gatorade until your urine is clear.  Saline nasal irrigation with a Milta Deiters medicine distilled water as often as you want, Flonase for the nasal congestion, postnasal drip.  Get a pulse oximeter and keep an eye on your oxygen levels.  Go to the ER if it is consistently below 90%.

## 2020-01-07 NOTE — ED Triage Notes (Signed)
Pt presents with nasal; congestion, cough, sore throat, headache, bloody aches and low appetite x 2 days. Denies fever, chills, sob.

## 2020-01-07 NOTE — ED Provider Notes (Signed)
HPI  SUBJECTIVE:  Robyn [name] is a 50 y.o. female who presents with 2 days of fatigue, chills, sore throat, nausea, anorexia, headaches, body aches, nasal congestion, rhinorrhea, postnasal drip, loss of sense of taste, occasional cough.  She reports diffuse mild abdominal pain described as hunger pains.  No fevers, loss of sense of smell, shortness of breath, vomiting, diarrhea.  She got the [employer] booster in October.  She also got a flu vaccine.  No known flu or COVID exposure.  No antipyretic in the past 6 hours.  She has tried vitamin C and zinc without improvement in her symptoms.  Symptoms are worse when she sits up.  She has a past medical history of hypertension, chronic kidney disease stage III, diabetes, hypercholesterolemia.  She is a former smoker.  LMP: Amenorrheic.  Denies possibility of being pregnant.  YIR:SWNIOEVOJ Robyn Beals, MD  Past Medical History:  Diagnosis Date  . Abnormal Pap smear   . Allergy   . Anxiety    OCCAS PANIC ATTACKS  . Back pain   . Bilateral swelling of feet   . Blood transfusion 1990'S  . Blood transfusion without reported diagnosis   . BV (bacterial vaginosis)   . Cough    nonproductive cough last 2 weeks  . Depression 2010  . Fibroid 2011  . Gallbladder problem   . GERD (gastroesophageal reflux disease)    occasional  . H/O dysmenorrhea   . H/O varicella   . H/O: menorrhagia 2011  . Hypertension   . Hypokalemia    PAST HX  . Hypothyroid   . Increased BMI   . Kidney problem   . Kidney stones   . Obesity   . OSA (obstructive sleep apnea) 04/22/2013  . Ovarian cyst 2011  . Perimenopausal symptoms 07/15/2010  . Pre-diabetes   . Pregnancy induced hypertension   . Shortness of breath    ONLY WITH ANXIETY  . Sleep apnea    PT USES CPAP SOMETIMES - SETTING IS 3  . Vitamin D deficiency   . Weight loss 2010    Past Surgical History:  Procedure Laterality Date  . C-SECTIONS X 2    . [place]  .  CHOLECYSTECTOMY  1994  . COLONOSCOPY WITH PROPOFOL N/A 10/26/2018   Procedure: COLONOSCOPY WITH PROPOFOL;  Surgeon: Juanita Craver, MD;  Location: [facility];  Service: Endoscopy;  Laterality: N/A;  . NEPHROLITHOTOMY  04/09/2011   Procedure: NEPHROLITHOTOMY PERCUTANEOUS;  Surgeon: Molli Hazard, MD;  Location: [facility];  Service: Urology;  Laterality: Right;      . NEPHROLITHOTOMY  10/15/2011   Procedure: NEPHROLITHOTOMY PERCUTANEOUS;  Surgeon: Molli Hazard, MD;  Location: [facility];  Service: Urology;  Laterality: Left;  . PERCUTANEOUS NEPHROSTOMY AROUND 1996  may 2013   right kidney  . REMOVAL OF STONES  10/15/2011   Procedure: REMOVAL OF STONES;  Surgeon: Molli Hazard, MD;  Location: [facility];  Service: Urology;  Laterality: Left;  . TUBAL LIGATION  1999  . URETEROSCOPY  04/09/2011   Procedure: URETEROSCOPY;  Surgeon: Molli Hazard, MD;  Location: [facility];  Service: Urology;  Laterality: Right;  . UTERINE ABLATION MARCH 2010  march 2011    Family History  Problem Relation Age of Onset  . Diabetes Mother   . Hypertension Mother   . Obesity Mother   . Hypertension Father   . Diabetes Father   . Kidney disease Father   . Stroke Father   .  Sleep apnea Father   . Diabetes Paternal Grandmother   . Diabetes Maternal Grandmother   . Heart disease Maternal Grandmother     Social History   Tobacco Use  . Smoking status: Former Smoker    Packs/day: 0.25    Years: 15.00    Pack years: 3.75  . Smokeless tobacco: Never Used  Substance Use Topics  . Alcohol use: Yes    Comment: occasional  . Drug use: No    No current facility-administered medications for this encounter.  Current Outpatient Medications:  .  fluticasone (FLONASE) 50 MCG/ACT nasal spray, Place 2 sprays into both nostrils daily., Disp: 16 g, Rfl: 0 .  ondansetron (ZOFRAN ODT) 8 MG disintegrating tablet, 1/2- 1 tablet q 8 hr prn nausea, vomiting, Disp: 20 tablet, Rfl: 0 .  allopurinol  (ZYLOPRIM) 100 MG tablet, Take 100 mg by mouth daily. , Disp: , Rfl:  .  amLODipine (NORVASC) 10 MG tablet, Take 1 tablet (10 mg total) by mouth daily., Disp: 90 tablet, Rfl: 1 .  atorvastatin (LIPITOR) 80 MG tablet, Take 1 tablet (80 mg total) by mouth daily., Disp: 90 tablet, Rfl: 1 .  carvedilol (COREG) 25 MG tablet, Take 1 tablet (25 mg total) by mouth 2 (two) times daily with a meal., Disp: 30 tablet, Rfl: 0 .  chlorthalidone (HYGROTON) 25 MG tablet, Take 25 mg by mouth daily., Disp: , Rfl:  .  colchicine 0.6 MG tablet, Take 0.6 mg by mouth daily as needed (gout). , Disp: , Rfl:  .  levothyroxine (SYNTHROID) 25 MCG tablet, TAKE 1 TABLET BY MOUTH EVERY DAY, Disp: 30 tablet, Rfl: 11 .  Multiple Vitamins-Minerals (MULTIVITAMIN WITH MINERALS) tablet, Take 1 tablet by mouth daily., Disp: , Rfl:  .  Semaglutide (RYBELSUS) 7 MG TABS, Take 7 mg by mouth daily., Disp: 90 tablet, Rfl: 1 .  sertraline (ZOLOFT) 50 MG tablet, TAKE 1 TABLET BY MOUTH EVERY DAY, Disp: 30 tablet, Rfl: 2 .  traZODone (DESYREL) 50 MG tablet, Take 1.5 tablets (75 mg total) by mouth at bedtime as needed for sleep., Disp: 135 tablet, Rfl: 1 .  Vitamin D, Ergocalciferol, (DRISDOL) 1.25 MG (50000 UNIT) CAPS capsule, Take 1 capsule (50,000 Units total) by mouth every 7 (seven) days for 12 doses., Disp: 12 capsule, Rfl: 0  Allergies  Allergen Reactions  . Sulfa Antibiotics Hives and Rash     ROS  As noted in HPI.   Physical Exam  BP (!) 157/105 (BP Location: Left Wrist)   Pulse 71   Temp 98.6 F (37 C) (Oral)   Resp (!) 21   SpO2 96%   Constitutional: Well developed, well nourished, no acute distress Eyes: PERRL, EOMI, conjunctiva normal bilaterally HENT: Normocephalic, atraumatic,mucus membranes moist.  Positive nasal congestion. Neck: No cervical lymphadenopathy Respiratory: Clear to auscultation bilaterally, no rales, no wheezing, no rhonchi Cardiovascular: Normal rate and rhythm, no murmurs, no gallops, no  rubs GI: Soft, nondistended, normal bowel sounds, nontender, no rebound, no guarding skin: No rash, skin intact Musculoskeletal: No edema, no tenderness, no deformities Neurologic: Alert & oriented x 3, CN III-XII grossly intact, no motor deficits, sensation grossly intact Psychiatric: Speech and behavior appropriate   ED Course   Medications - No data to display  Orders Placed This Encounter  Procedures  . Resp Panel by RT-PCR (Flu A&B, Covid) Nasopharyngeal Swab    Standing Status:   Standing    Number of Occurrences:   1    Order Specific Question:  Is this test for diagnosis or screening    Answer:   Diagnosis of ill patient    Order Specific Question:   Symptomatic for COVID-19 as defined by CDC    Answer:   Yes    Order Specific Question:   Date of Symptom Onset    Answer:   01/05/2020    Order Specific Question:   Hospitalized for COVID-19    Answer:   No    Order Specific Question:   Admitted to ICU for COVID-19    Answer:   No    Order Specific Question:   Previously tested for COVID-19    Answer:   No    Order Specific Question:   Resident in a congregate (group) care setting    Answer:   No    Order Specific Question:   Employed in healthcare setting    Answer:   No    Order Specific Question:   Pregnant    Answer:   No    Order Specific Question:   Has patient completed COVID vaccination(s) (2 doses of Pfizer/Moderna 1 dose of [employer])    Answer:   Yes   Results for orders placed or performed during the hospital encounter of 01/07/20 (from the past 24 hour(s))  Resp Panel by RT-PCR (Flu A&B, Covid) Nasopharyngeal Swab     Status: Abnormal   Collection Time: 01/07/20  3:08 PM   Specimen: Nasopharyngeal Swab; Nasopharyngeal(NP) swabs in vial transport medium  Result Value Ref Range   SARS Coronavirus 2 by RT PCR POSITIVE (A) NEGATIVE   Influenza A by PCR NEGATIVE NEGATIVE   Influenza B by PCR NEGATIVE NEGATIVE   No results found.  ED Clinical  Impression  1. COVID-19 virus infection   2. Upper respiratory tract infection, unspecified type   3. Encounter for laboratory testing for COVID-19 virus      ED Assessment/Plan  Patient with URI.  Suspect Covid.  Will send home with Zofran, push electrolyte containing fluids, Tylenol, saline nasal irrigation, Flonase.  She will be a candidate for monoclonal body infusion based on medical comorbidities.  Will call in a prescription of Tamiflu if flu is positive.  Advised buying a pulse oximeter and pushing fluids.  Follow-up with PMD as needed.  To the ER if she gets worse.  Covid positive.  Sending message for monoclonal body infusion team for screening.  Believe that she would be a candidate based on chronic kidney disease, hypertension, BMI, diabetes, hypercholesterolemia. Discussed labs, MDM, treatment plan, and plan for follow-up with patient Discussed sn/sx that should prompt return to the ED. patient agrees with plan.   Meds ordered this encounter  Medications  . fluticasone (FLONASE) 50 MCG/ACT nasal spray    Sig: Place 2 sprays into both nostrils daily.    Dispense:  16 g    Refill:  0  . ondansetron (ZOFRAN ODT) 8 MG disintegrating tablet    Sig: 1/2- 1 tablet q 8 hr prn nausea, vomiting    Dispense:  20 tablet    Refill:  0    *This clinic note was created using [employer]. Therefore, there may be occasional mistakes despite careful proofreading.  ?    Melynda Ripple, MD 01/08/20 [phone]

## 2020-01-28 ENCOUNTER — Other Ambulatory Visit: Payer: Self-pay | Admitting: Internal Medicine

## 2020-01-28 DIAGNOSIS — E559 Vitamin D deficiency, unspecified: Secondary | ICD-10-CM

## 2020-02-10 ENCOUNTER — Other Ambulatory Visit: Payer: Self-pay | Admitting: Internal Medicine

## 2020-02-10 DIAGNOSIS — I10 Essential (primary) hypertension: Secondary | ICD-10-CM

## 2020-02-16 ENCOUNTER — Other Ambulatory Visit: Payer: Self-pay

## 2020-02-17 ENCOUNTER — Ambulatory Visit: Payer: 59 | Admitting: Internal Medicine

## 2020-02-21 ENCOUNTER — Other Ambulatory Visit: Payer: Self-pay

## 2020-02-21 ENCOUNTER — Ambulatory Visit: Payer: 59 | Admitting: Internal Medicine

## 2020-02-21 ENCOUNTER — Encounter: Payer: Self-pay | Admitting: Internal Medicine

## 2020-02-21 VITALS — BP 120/85 | HR 76 | Temp 98.2°F | Wt 272.0 lb

## 2020-02-21 DIAGNOSIS — E1122 Type 2 diabetes mellitus with diabetic chronic kidney disease: Secondary | ICD-10-CM | POA: Diagnosis not present

## 2020-02-21 DIAGNOSIS — E038 Other specified hypothyroidism: Secondary | ICD-10-CM | POA: Diagnosis not present

## 2020-02-21 DIAGNOSIS — F339 Major depressive disorder, recurrent, unspecified: Secondary | ICD-10-CM | POA: Diagnosis not present

## 2020-02-21 DIAGNOSIS — E785 Hyperlipidemia, unspecified: Secondary | ICD-10-CM

## 2020-02-21 DIAGNOSIS — N1831 Chronic kidney disease, stage 3a: Secondary | ICD-10-CM | POA: Diagnosis not present

## 2020-02-21 DIAGNOSIS — E1169 Type 2 diabetes mellitus with other specified complication: Secondary | ICD-10-CM

## 2020-02-21 LAB — POCT GLYCOSYLATED HEMOGLOBIN (HGB A1C): Hemoglobin A1C: 5.5 % (ref 4.0–5.6)

## 2020-02-21 LAB — VITAMIN D 25 HYDROXY (VIT D DEFICIENCY, FRACTURES): VITD: 35.98 ng/mL (ref 30.00–100.00)

## 2020-02-21 LAB — TSH: TSH: 3.38 u[IU]/mL (ref 0.35–4.50)

## 2020-02-21 LAB — VITAMIN B12: Vitamin B-12: 514 pg/mL (ref 211–911)

## 2020-02-21 NOTE — Progress Notes (Signed)
Established Patient Office Visit     This visit occurred during the SARS-CoV-2 public health emergency.  Safety protocols were in place, including screening questions prior to the visit, additional usage of staff PPE, and extensive cleaning of exam room while observing appropriate contact time as indicated for disinfecting solutions.    CC/Reason for Visit: Follow-up chronic medical conditions  HPI: Robyn [name] is a 50 y.o. female who is coming in today for the above mentioned reasons. Past Medical History is significant for: Morbid obesity, hypertension that has not been well controlled, obstructive sleep apnea on CPAP, type 2 diabetes, hypothyroidism, chronic kidney disease stage III, chronic gout and depression. She feels like her mood has been more depressed lately. She feels her blood pressure has improved since increasing her amlodipine doses at last visit. Her immunizations are up-to-date.   Past Medical/Surgical History: Past Medical History:  Diagnosis Date  . Abnormal Pap smear   . Allergy   . Anxiety    OCCAS PANIC ATTACKS  . Back pain   . Bilateral swelling of feet   . Blood transfusion 1990'S  . Blood transfusion without reported diagnosis   . BV (bacterial vaginosis)   . Cough    nonproductive cough last 2 weeks  . Depression 2010  . Fibroid 2011  . Gallbladder problem   . GERD (gastroesophageal reflux disease)    occasional  . H/O dysmenorrhea   . H/O varicella   . H/O: menorrhagia 2011  . Hypertension   . Hypokalemia    PAST HX  . Hypothyroid   . Increased BMI   . Kidney problem   . Kidney stones   . Obesity   . OSA (obstructive sleep apnea) 04/22/2013  . Ovarian cyst 2011  . Perimenopausal symptoms 07/15/2010  . Pre-diabetes   . Pregnancy induced hypertension   . Shortness of breath    ONLY WITH ANXIETY  . Sleep apnea    PT USES CPAP SOMETIMES - SETTING IS 3  . Vitamin D deficiency   . Weight loss 2010    Past Surgical History:   Procedure Laterality Date  . C-SECTIONS X 2    . [place]  . CHOLECYSTECTOMY  1994  . COLONOSCOPY WITH PROPOFOL N/A 10/26/2018   Procedure: COLONOSCOPY WITH PROPOFOL;  Surgeon: Juanita Craver, MD;  Location: [facility];  Service: Endoscopy;  Laterality: N/A;  . NEPHROLITHOTOMY  04/09/2011   Procedure: NEPHROLITHOTOMY PERCUTANEOUS;  Surgeon: Molli Hazard, MD;  Location: [facility];  Service: Urology;  Laterality: Right;      . NEPHROLITHOTOMY  10/15/2011   Procedure: NEPHROLITHOTOMY PERCUTANEOUS;  Surgeon: Molli Hazard, MD;  Location: [facility];  Service: Urology;  Laterality: Left;  . PERCUTANEOUS NEPHROSTOMY AROUND 1996  may 2013   right kidney  . REMOVAL OF STONES  10/15/2011   Procedure: REMOVAL OF STONES;  Surgeon: Molli Hazard, MD;  [facility];  Service: Urology;  Laterality: Left;  . TUBAL LIGATION  1999  . URETEROSCOPY  04/09/2011   Procedure: URETEROSCOPY;  Surgeon: Molli Hazard, MD;  Location: [facility];  Service: Urology;  Laterality: Right;  . UTERINE ABLATION MARCH 2010  march 2011    Social History:  reports that she has quit smoking. She has a 3.75 pack-year smoking history. She has never used smokeless tobacco. She reports current alcohol use. She reports that she does not use drugs.  Allergies: Allergies  Allergen Reactions  . Sulfa Antibiotics  Hives and Rash    Family History:  Family History  Problem Relation Age of Onset  . Diabetes Mother   . Hypertension Mother   . Obesity Mother   . Hypertension Father   . Diabetes Father   . Kidney disease Father   . Stroke Father   . Sleep apnea Father   . Diabetes Paternal Grandmother   . Diabetes Maternal Grandmother   . Heart disease Maternal Grandmother      Current Outpatient Medications:  .  allopurinol (ZYLOPRIM) 100 MG tablet, Take 100 mg by mouth daily. , Disp: , Rfl:  .  amLODipine (NORVASC) 10 MG tablet, TAKE 1 TABLET BY MOUTH EVERY DAY, Disp: 90  tablet, Rfl: 1 .  atorvastatin (LIPITOR) 80 MG tablet, Take 1 tablet (80 mg total) by mouth daily., Disp: 90 tablet, Rfl: 1 .  carvedilol (COREG) 25 MG tablet, Take 1 tablet (25 mg total) by mouth 2 (two) times daily with a meal., Disp: 30 tablet, Rfl: 0 .  chlorthalidone (HYGROTON) 25 MG tablet, Take 25 mg by mouth daily., Disp: , Rfl:  .  colchicine 0.6 MG tablet, Take 0.6 mg by mouth daily as needed (gout). , Disp: , Rfl:  .  dapagliflozin propanediol (FARXIGA) 10 MG TABS tablet, Take 10 mg by mouth daily., Disp: , Rfl:  .  fluticasone (FLONASE) 50 MCG/ACT nasal spray, Place 2 sprays into both nostrils daily., Disp: 16 g, Rfl: 0 .  levothyroxine (SYNTHROID) 25 MCG tablet, TAKE 1 TABLET BY MOUTH EVERY DAY, Disp: 30 tablet, Rfl: 11 .  Multiple Vitamins-Minerals (MULTIVITAMIN WITH MINERALS) tablet, Take 1 tablet by mouth daily., Disp: , Rfl:  .  ondansetron (ZOFRAN ODT) 8 MG disintegrating tablet, 1/2- 1 tablet q 8 hr prn nausea, vomiting, Disp: 20 tablet, Rfl: 0 .  Semaglutide (RYBELSUS) 7 MG TABS, Take 7 mg by mouth daily., Disp: 90 tablet, Rfl: 1 .  sertraline (ZOLOFT) 50 MG tablet, TAKE 1 TABLET BY MOUTH EVERY DAY, Disp: 30 tablet, Rfl: 2 .  traZODone (DESYREL) 50 MG tablet, Take 1.5 tablets (75 mg total) by mouth at bedtime as needed for sleep., Disp: 135 tablet, Rfl: 1 .  Vitamin D, Ergocalciferol, (DRISDOL) 1.25 MG (50000 UNIT) CAPS capsule, TAKE 1 CAPSULE (50,000 UNITS TOTAL) BY MOUTH EVERY 7 (SEVEN) DAYS FOR 12 DOSES., Disp: 4 capsule, Rfl: 2  Review of Systems:  Constitutional: Denies fever, chills, diaphoresis, appetite change and fatigue.  HEENT: Denies photophobia, eye pain, redness, hearing loss, ear pain, congestion, sore throat, rhinorrhea, sneezing, mouth sores, trouble swallowing, neck pain, neck stiffness and tinnitus.   Respiratory: Denies SOB, DOE, cough, chest tightness,  and wheezing.   Cardiovascular: Denies chest pain, palpitations and leg swelling.  Gastrointestinal:  Denies nausea, vomiting, abdominal pain, diarrhea, constipation, blood in stool and abdominal distention.  Genitourinary: Denies dysuria, urgency, frequency, hematuria, flank pain and difficulty urinating.  Endocrine: Denies: hot or cold intolerance, sweats, changes in hair or nails, polyuria, polydipsia. Musculoskeletal: Denies myalgias, back pain, joint swelling, arthralgias and gait problem.  Skin: Denies pallor, rash and wound.  Neurological: Denies dizziness, seizures, syncope, weakness, light-headedness, numbness and headaches.  Hematological: Denies adenopathy. Easy bruising, personal or family bleeding history  Psychiatric/Behavioral: Denies suicidal ideation, confusion, nervousness, sleep disturbance and agitation    Physical Exam: Vitals:   02/21/20 1005  BP: 120/85  Pulse: 76  Temp: 98.2 F (36.8 C)  TempSrc: Oral  SpO2: 98%  Weight: 272 lb (123.4 kg)    Body mass index is  46.69 kg/m.   Constitutional: NAD, calm, comfortable, obese Eyes: PERRL, lids and conjunctivae normal ENMT: Mucous membranes are moist.  Respiratory: clear to auscultation bilaterally, no wheezing, no crackles. Normal respiratory effort. No accessory muscle use.  Cardiovascular: Regular rate and rhythm, no murmurs / rubs / gallops. No extremity edema.  Neurologic: Grossly intact and nonfocal Psychiatric: Normal judgment and insight. Alert and oriented x 3. Normal mood.    Impression and Plan:  Type 2 diabetes mellitus with stage 3a chronic kidney disease, without long-term current use of insulin (HCC)  -A1c is well controlled at 5.5 today. She is on Farxiga 10 mg.  Hyperlipidemia associated with type 2 diabetes mellitus ([place]) -She is on atorvastatin 80 mg, recheck lipids next visit.  Stage 3a chronic kidney disease ([place]) -Followed by nephrology.  Other specified hypothyroidism -Check TSH today.  Depression, recurrent ([place] ) - Plan: TSH, Vitamin B12, VITAMIN D 25 Hydroxy (Vit-D  Deficiency, Fractures) -She is on sertraline, have advised that she look into CBT visits, she agrees.  Morbid obesity ([place]), Chronic -Discussed healthy lifestyle, including increased physical activity and better food choices to promote weight loss.    Patient Instructions  -Nice seeing you today!!  -Lab work today; will notify you once results are available.  -Schedule follow up in 3 months for your physical. Please come in fasting that day.  -Remember to schedule your counseling sessions.     Lelon Frohlich, MD [facility]

## 2020-02-21 NOTE — Patient Instructions (Signed)
-  Nice seeing you today!!  -Lab work today; will notify you once results are available.  -Schedule follow up in 3 months for your physical. Please come in fasting that day.  -Remember to schedule your counseling sessions.

## 2020-02-21 NOTE — Addendum Note (Signed)
Addended by: Tessie Fass D on: 02/21/2020 10:41 AM   Modules accepted: Orders

## 2020-02-22 ENCOUNTER — Other Ambulatory Visit: Payer: Self-pay | Admitting: Internal Medicine

## 2020-02-22 DIAGNOSIS — E559 Vitamin D deficiency, unspecified: Secondary | ICD-10-CM

## 2020-02-22 MED ORDER — VITAMIN D (ERGOCALCIFEROL) 1.25 MG (50000 UNIT) PO CAPS
50000.0000 [IU] | ORAL_CAPSULE | ORAL | 0 refills | Status: AC
Start: 2020-02-22 — End: 2020-05-10

## 2020-02-27 ENCOUNTER — Other Ambulatory Visit: Payer: Self-pay | Admitting: Neurology

## 2020-02-27 ENCOUNTER — Telehealth: Payer: Self-pay | Admitting: Neurology

## 2020-02-27 NOTE — Telephone Encounter (Signed)
There is a possibility of trazodone and SSRi causing serotonin disorder, but we have had still good success with the combo - with the ssri taken in AM and Trazodone only at night.  Measure your BP daily at the same time and inform your physician if over 277 mmHG systolic.  Don't forget : Most important prevention is hydration and is smoking cessation

## 2020-03-01 NOTE — Telephone Encounter (Signed)
Spoke with patient and let her know of Dr Dohmeier's message below. Pt was very appreciative for the call. Her questions were answered.

## 2020-03-29 ENCOUNTER — Other Ambulatory Visit: Payer: Self-pay | Admitting: Internal Medicine

## 2020-03-29 DIAGNOSIS — F32A Depression, unspecified: Secondary | ICD-10-CM

## 2020-03-29 DIAGNOSIS — F419 Anxiety disorder, unspecified: Secondary | ICD-10-CM

## 2020-04-14 ENCOUNTER — Other Ambulatory Visit: Payer: Self-pay

## 2020-04-14 ENCOUNTER — Ambulatory Visit (HOSPITAL_COMMUNITY)
Admission: EM | Admit: 2020-04-14 | Discharge: 2020-04-14 | Disposition: A | Discharge status: Home / Self Care | Payer: 59 | Attending: Internal Medicine | Admitting: Internal Medicine

## 2020-04-14 DIAGNOSIS — R059 Cough, unspecified: Secondary | ICD-10-CM | POA: Diagnosis not present

## 2020-04-14 DIAGNOSIS — Z7984 Long term (current) use of oral hypoglycemic drugs: Secondary | ICD-10-CM | POA: Diagnosis not present

## 2020-04-14 DIAGNOSIS — Z6841 Body Mass Index (BMI) 40.0 and over, adult: Secondary | ICD-10-CM | POA: Diagnosis not present

## 2020-04-14 DIAGNOSIS — R1084 Generalized abdominal pain: Secondary | ICD-10-CM

## 2020-04-14 DIAGNOSIS — Z79899 Other long term (current) drug therapy: Secondary | ICD-10-CM | POA: Insufficient documentation

## 2020-04-14 DIAGNOSIS — N390 Urinary tract infection, site not specified: Secondary | ICD-10-CM

## 2020-04-14 DIAGNOSIS — Z8616 Personal history of COVID-19: Secondary | ICD-10-CM | POA: Diagnosis not present

## 2020-04-14 DIAGNOSIS — B349 Viral infection, unspecified: Secondary | ICD-10-CM

## 2020-04-14 DIAGNOSIS — N1831 Chronic kidney disease, stage 3a: Secondary | ICD-10-CM | POA: Insufficient documentation

## 2020-04-14 DIAGNOSIS — Z882 Allergy status to sulfonamides status: Secondary | ICD-10-CM | POA: Insufficient documentation

## 2020-04-14 DIAGNOSIS — J029 Acute pharyngitis, unspecified: Secondary | ICD-10-CM | POA: Insufficient documentation

## 2020-04-14 DIAGNOSIS — Z87891 Personal history of nicotine dependence: Secondary | ICD-10-CM | POA: Insufficient documentation

## 2020-04-14 DIAGNOSIS — Z7901 Long term (current) use of anticoagulants: Secondary | ICD-10-CM | POA: Insufficient documentation

## 2020-04-14 DIAGNOSIS — E1122 Type 2 diabetes mellitus with diabetic chronic kidney disease: Secondary | ICD-10-CM | POA: Diagnosis not present

## 2020-04-14 DIAGNOSIS — G8929 Other chronic pain: Secondary | ICD-10-CM | POA: Diagnosis not present

## 2020-04-14 DIAGNOSIS — R0981 Nasal congestion: Secondary | ICD-10-CM | POA: Insufficient documentation

## 2020-04-14 DIAGNOSIS — F32A Depression, unspecified: Secondary | ICD-10-CM | POA: Insufficient documentation

## 2020-04-14 DIAGNOSIS — R438 Other disturbances of smell and taste: Secondary | ICD-10-CM | POA: Diagnosis not present

## 2020-04-14 DIAGNOSIS — Z20822 Contact with and (suspected) exposure to covid-19: Secondary | ICD-10-CM | POA: Insufficient documentation

## 2020-04-14 DIAGNOSIS — F419 Anxiety disorder, unspecified: Secondary | ICD-10-CM | POA: Insufficient documentation

## 2020-04-14 DIAGNOSIS — R300 Dysuria: Secondary | ICD-10-CM | POA: Diagnosis not present

## 2020-04-14 DIAGNOSIS — I129 Hypertensive chronic kidney disease with stage 1 through stage 4 chronic kidney disease, or unspecified chronic kidney disease: Secondary | ICD-10-CM | POA: Insufficient documentation

## 2020-04-14 LAB — POCT URINALYSIS DIPSTICK, ED / UC
Bilirubin Urine: NEGATIVE
Glucose, UA: NEGATIVE mg/dL
Hgb urine dipstick: NEGATIVE
Ketones, ur: NEGATIVE mg/dL
Leukocytes,Ua: NEGATIVE
Nitrite: NEGATIVE
Protein, ur: >=300 mg/dL — AB
Specific Gravity, Urine: 1.02 (ref 1.005–1.030)
Urobilinogen, UA: 0.2 mg/dL (ref 0.0–1.0)
pH: 5.5 (ref 5.0–8.0)

## 2020-04-14 LAB — CBG MONITORING, ED: Glucose-Capillary: 85 mg/dL (ref 70–99)

## 2020-04-14 LAB — SARS CORONAVIRUS 2 (TAT 6-24 HRS): SARS Coronavirus 2: NEGATIVE

## 2020-04-14 NOTE — ED Triage Notes (Signed)
Pt reports she has lost her smell and taste. Pt alos has sore throat,congestion .

## 2020-04-14 NOTE — Discharge Instructions (Signed)
Your COVID test is pending.  You should self quarantine until the test result is back.    Take Tylenol or ibuprofen as needed for fever or discomfort.  Rest and keep yourself hydrated.    Follow-up with your primary care provider if your symptoms are not improving.     

## 2020-04-14 NOTE — ED Provider Notes (Signed)
[place]    CSN: [id number] Arrival date & time: 04/14/20  1624      History   Chief Complaint Chief Complaint  Patient presents with  . Cough  . Sore Throat  . Nasal Congestion  . Urinary Tract Infection  . Dysuria    HPI Robyn [name] is a 50 y.o. female.   Patient presents with 4-day history of runny nose, nasal congestion, postnasal drip, sore throat, cough, loss of taste/smell.  Treatment attempted at home with OTC allergy medication and Robitussin.  She denies fever, chills, rash, shortness of breath, vomiting, diarrhea, constipation, or other symptoms.  Patient also request a check of her blood sugar and check for UTI.  She reports mild generalized abdominal pain.  Her medical history includes COVID-19 in January 2022, hypertension, CKD, obesity, anxiety, depression, chronic back pain.  The history is provided by the patient and medical records.    Past Medical History:  Diagnosis Date  . Abnormal Pap smear   . Allergy   . Anxiety    OCCAS PANIC ATTACKS  . Back pain   . Bilateral swelling of feet   . Blood transfusion 1990'S  . Blood transfusion without reported diagnosis   . BV (bacterial vaginosis)   . Cough    nonproductive cough last 2 weeks  . Depression 2010  . Fibroid 2011  . Gallbladder problem   . GERD (gastroesophageal reflux disease)    occasional  . H/O dysmenorrhea   . H/O varicella   . H/O: menorrhagia 2011  . Hypertension   . Hypokalemia    PAST HX  . Hypothyroid   . Increased BMI   . Kidney problem   . Kidney stones   . Obesity   . OSA (obstructive sleep apnea) 04/22/2013  . Ovarian cyst 2011  . Perimenopausal symptoms 07/15/2010  . Pre-diabetes   . Pregnancy induced hypertension   . Shortness of breath    ONLY WITH ANXIETY  . Sleep apnea    PT USES CPAP SOMETIMES - SETTING IS 3  . Vitamin D deficiency   . Weight loss 2010    Patient Active Problem List   Diagnosis Date Noted  . Hyperlipidemia associated with  type 2 diabetes mellitus ([place]) 11/18/2019  . Vitamin D deficiency 12/06/2018  . Other hyperlipidemia 12/06/2018  . Type 2 diabetes mellitus with stage 3a chronic kidney disease, without long-term current use of insulin ([place]) 11/10/2018  . Class 3 severe obesity with serious comorbidity and body mass index (BMI) of 45.0 to 49.9 in adult ([place]) 11/10/2018  . Hot flashes due to menopause 04/27/2018  . Other insomnia 04/27/2018  . Poor compliance with CPAP treatment 04/27/2018  . Chronic nasal congestion 04/27/2018  . Uterine leiomyoma 02/06/2018  . Hypothyroidism 04/12/2014  . Hypersomnia, persistent 01/09/2014  . OSA on CPAP 01/09/2014  . Severe obesity (BMI >= 40) ([place]) 01/09/2014  . Unspecified sleep apnea 09/30/2012  . Positive Helicobacter pylori titer 03/19/2012  . Chronic low back pain 01/22/2012  . Subclinical diabetes mellitus 01/22/2012  . Hypertension 12/28/2011  . Nephrolithiasis 10/15/2011  . Hypokalemia 08/22/2011  . CKD (chronic kidney disease), stage III ([place]) 08/22/2011  . Anxiety disorder 07/29/2011  . Tobacco user 07/29/2011    Past Surgical History:  Procedure Laterality Date  . C-SECTIONS X 2    . [place]  . CHOLECYSTECTOMY  1994  . COLONOSCOPY WITH PROPOFOL N/A 10/26/2018   Procedure: COLONOSCOPY WITH PROPOFOL;  Surgeon:  Juanita Craver, MD;  Location: [facility];  Service: Endoscopy;  Laterality: N/A;  . NEPHROLITHOTOMY  04/09/2011   Procedure: NEPHROLITHOTOMY PERCUTANEOUS;  Surgeon: Molli Hazard, MD;  Location: [facility];  Service: Urology;  Laterality: Right;      . NEPHROLITHOTOMY  10/15/2011   Procedure: NEPHROLITHOTOMY PERCUTANEOUS;  Surgeon: Molli Hazard, MD;  Location: [facility];  Service: Urology;  Laterality: Left;  . PERCUTANEOUS NEPHROSTOMY AROUND 1996  may 2013   right kidney  . REMOVAL OF STONES  10/15/2011   Procedure: REMOVAL OF STONES;  Surgeon: Molli Hazard, MD;  Location: [facility];  Service: Urology;   Laterality: Left;  . TUBAL LIGATION  1999  . URETEROSCOPY  04/09/2011   Procedure: URETEROSCOPY;  Surgeon: Molli Hazard, MD;  Location: [facility];  Service: Urology;  Laterality: Right;  . UTERINE ABLATION MARCH 2010  march 2011    OB History    Gravida  2   Para  2   Term  2   Preterm      AB      Living  2     SAB      IAB      Ectopic      Multiple      Live Births  2            Home Medications    Prior to Admission medications   Medication Sig Start Date End Date Taking? Authorizing Provider  allopurinol (ZYLOPRIM) 100 MG tablet Take 100 mg by mouth daily.     [provider]  amLODipine (NORVASC) 10 MG tablet TAKE 1 TABLET BY MOUTH EVERY DAY 02/10/20   Isaac Bliss, Rayford Halsted, MD  atorvastatin (LIPITOR) 80 MG tablet Take 1 tablet (80 mg total) by mouth daily. 11/18/19   Isaac Bliss, Rayford Halsted, MD  carvedilol (COREG) 25 MG tablet Take 1 tablet (25 mg total) by mouth 2 (two) times daily with a meal. 05/15/15   Elby Beck, FNP  chlorthalidone (HYGROTON) 25 MG tablet Take 25 mg by mouth daily.    [provider]  colchicine 0.6 MG tablet Take 0.6 mg by mouth daily as needed (gout).     [provider]  dapagliflozin propanediol (FARXIGA) 10 MG TABS tablet Take 10 mg by mouth daily.    [provider]  fluticasone (FLONASE) 50 MCG/ACT nasal spray Place 2 sprays into both nostrils daily. 01/07/20   Melynda Ripple, MD  levothyroxine (SYNTHROID) 25 MCG tablet TAKE 1 TABLET BY MOUTH EVERY DAY 05/23/19   Forrest Moron, MD  Multiple Vitamins-Minerals (MULTIVITAMIN WITH MINERALS) tablet Take 1 tablet by mouth daily.    [provider]  ondansetron (ZOFRAN ODT) 8 MG disintegrating tablet 1/2- 1 tablet q 8 hr prn nausea, vomiting 01/07/20   Melynda Ripple, MD  Semaglutide (RYBELSUS) 7 MG TABS Take 7 mg by mouth daily. 11/18/19   Isaac Bliss, Rayford Halsted, MD  sertraline (ZOLOFT) 50 MG tablet TAKE 1 TABLET BY  MOUTH EVERY DAY 03/29/20   Isaac Bliss, Rayford Halsted, MD  traZODone (DESYREL) 50 MG tablet TAKE 1.5 TABLETS (75 MG TOTAL) BY MOUTH AT BEDTIME AS NEEDED FOR SLEEP. 02/27/20   Dohmeier, Asencion Partridge, MD  Vitamin D, Ergocalciferol, (DRISDOL) 1.25 MG (50000 UNIT) CAPS capsule Take 1 capsule (50,000 Units total) by mouth every 7 (seven) days for 12 doses. 02/22/20 05/10/20  Erline Hau, MD    Family History Family History  Problem Relation Age of Onset  .  Diabetes Mother   . Hypertension Mother   . Obesity Mother   . Hypertension Father   . Diabetes Father   . Kidney disease Father   . Stroke Father   . Sleep apnea Father   . Diabetes Paternal Grandmother   . Diabetes Maternal Grandmother   . Heart disease Maternal Grandmother     Social History Social History   Tobacco Use  . Smoking status: Former Smoker    Packs/day: 0.25    Years: 15.00    Pack years: 3.75  . Smokeless tobacco: Never Used  Substance Use Topics  . Alcohol use: Yes    Comment: occasional  . Drug use: No     Allergies   Sulfa antibiotics   Review of Systems Review of Systems  Constitutional: Negative for chills and fever.  HENT: Positive for congestion, postnasal drip, rhinorrhea, sinus pressure and sore throat. Negative for ear pain.   Eyes: Negative for pain and visual disturbance.  Respiratory: Positive for cough. Negative for shortness of breath.   Cardiovascular: Negative for chest pain and palpitations.  Gastrointestinal: Positive for abdominal pain. Negative for constipation, diarrhea, nausea and vomiting.  Genitourinary: Negative for dysuria and hematuria.  Musculoskeletal: Negative for arthralgias and back pain.  Skin: Negative for color change and rash.  Neurological: Negative for seizures and syncope.  All other systems reviewed and are negative.    Physical Exam Triage Vital Signs ED Triage Vitals  Enc Vitals Group     BP      Pulse      Resp      Temp      Temp src       SpO2      Weight      Height      Head Circumference      Peak Flow      Pain Score      Pain Loc      Pain Edu?      Excl. in [place]?    No data found.  Updated Vital Signs BP 111/76 (BP Location: Left Arm)   Pulse 83   Temp 98.5 F (36.9 C) (Oral)   Resp 20   SpO2 95%   Visual Acuity Right Eye Distance:   Left Eye Distance:   Bilateral Distance:    Right Eye Near:   Left Eye Near:    Bilateral Near:     Physical Exam Vitals and nursing note reviewed.  Constitutional:      General: She is not in acute distress.    Appearance: She is well-developed. She is obese. She is not ill-appearing.  HENT:     Head: Normocephalic and atraumatic.     Right Ear: Tympanic membrane normal.     Left Ear: Tympanic membrane normal.     Nose: Nose normal.     Mouth/Throat:     Mouth: Mucous membranes are moist.     Pharynx: Oropharynx is clear.  Eyes:     Conjunctiva/sclera: Conjunctivae normal.  Cardiovascular:     Rate and Rhythm: Normal rate and regular rhythm.     Heart sounds: Normal heart sounds.  Pulmonary:     Effort: Pulmonary effort is normal. No respiratory distress.     Breath sounds: Normal breath sounds.  Abdominal:     Palpations: Abdomen is soft.     Tenderness: There is no abdominal tenderness. There is no guarding or rebound.  Musculoskeletal:     Cervical back: Neck supple.  Skin:    General: Skin is warm and dry.     Findings: No rash.  Neurological:     General: No focal deficit present.     Mental Status: She is alert and oriented to person, place, and time.     Gait: Gait normal.  Psychiatric:        Mood and Affect: Mood normal.        Behavior: Behavior normal.      UC Treatments / Results  Labs (all labs ordered are listed, but only abnormal results are displayed) Labs Reviewed  POCT URINALYSIS DIPSTICK, ED / UC - Abnormal; Notable for the following components:      Result Value   Protein, ur >=300 (*)    All other components within  normal limits  SARS CORONAVIRUS 2 (TAT 6-24 HRS)  CBG MONITORING, ED    EKG   Radiology No results found.  Procedures Procedures (including critical care time)  Medications Ordered in UC Medications - No data to display  Initial Impression / Assessment and Plan / UC Course  I have reviewed the triage vital signs and the nursing notes.  Pertinent labs & imaging results that were available during my care of the patient were reviewed by me and considered in my medical decision making (see chart for details).   Viral illness, generalized abdominal pain.  Per patient request, CBG tested today and normal.  Urine does not show signs of infection.  COVID pending.  Instructed patient to self quarantine until the test results are back.  Discussed symptomatic treatment including Tylenol, Mucinex, rest, hydration.  Instructed patient to follow up with PCP if her symptoms are not improving.  Patient agrees to plan of care.    Final Clinical Impressions(s) / UC Diagnoses   Final diagnoses:  Viral illness  Generalized abdominal pain     Discharge Instructions     Your COVID test is pending.  You should self quarantine until the test result is back.    Take Tylenol or ibuprofen as needed for fever or discomfort.  Rest and keep yourself hydrated.    Follow-up with your primary care provider if your symptoms are not improving.        ED Prescriptions    None     PDMP not reviewed this encounter.   Sharion Balloon, NP 04/14/20 [phone]

## 2020-04-14 NOTE — ED Triage Notes (Signed)
Pt requested CBG done bronchospasm at home 165. Pt takes oral agent for BS.

## 2020-04-17 ENCOUNTER — Encounter: Payer: Self-pay | Admitting: Internal Medicine

## 2020-05-10 ENCOUNTER — Other Ambulatory Visit: Payer: Self-pay | Admitting: Internal Medicine

## 2020-05-10 DIAGNOSIS — E785 Hyperlipidemia, unspecified: Secondary | ICD-10-CM

## 2020-05-21 ENCOUNTER — Other Ambulatory Visit: Payer: Self-pay

## 2020-05-22 ENCOUNTER — Encounter: Payer: Self-pay | Admitting: Internal Medicine

## 2020-05-22 ENCOUNTER — Other Ambulatory Visit: Payer: Self-pay | Admitting: Internal Medicine

## 2020-05-22 ENCOUNTER — Ambulatory Visit: Payer: 59 | Admitting: Internal Medicine

## 2020-05-22 VITALS — BP 130/80 | HR 74 | Temp 98.2°F | Ht 64.0 in | Wt 277.4 lb

## 2020-05-22 DIAGNOSIS — E781 Pure hyperglyceridemia: Secondary | ICD-10-CM | POA: Insufficient documentation

## 2020-05-22 DIAGNOSIS — E1122 Type 2 diabetes mellitus with diabetic chronic kidney disease: Secondary | ICD-10-CM

## 2020-05-22 DIAGNOSIS — Z9989 Dependence on other enabling machines and devices: Secondary | ICD-10-CM

## 2020-05-22 DIAGNOSIS — N1831 Chronic kidney disease, stage 3a: Secondary | ICD-10-CM

## 2020-05-22 DIAGNOSIS — E038 Other specified hypothyroidism: Secondary | ICD-10-CM

## 2020-05-22 DIAGNOSIS — E559 Vitamin D deficiency, unspecified: Secondary | ICD-10-CM

## 2020-05-22 DIAGNOSIS — E785 Hyperlipidemia, unspecified: Secondary | ICD-10-CM

## 2020-05-22 DIAGNOSIS — E1169 Type 2 diabetes mellitus with other specified complication: Secondary | ICD-10-CM

## 2020-05-22 DIAGNOSIS — G4709 Other insomnia: Secondary | ICD-10-CM

## 2020-05-22 DIAGNOSIS — G4733 Obstructive sleep apnea (adult) (pediatric): Secondary | ICD-10-CM

## 2020-05-22 LAB — VITAMIN D 25 HYDROXY (VIT D DEFICIENCY, FRACTURES): VITD: 32.88 ng/mL (ref 30.00–100.00)

## 2020-05-22 LAB — POCT GLYCOSYLATED HEMOGLOBIN (HGB A1C): Hemoglobin A1C: 6 % — AB (ref 4.0–5.6)

## 2020-05-22 LAB — LIPID PANEL
Cholesterol: 204 mg/dL — ABNORMAL HIGH (ref 0–200)
HDL: 48.3 mg/dL (ref >39.00)
NonHDL: 155.23
Total CHOL/HDL Ratio: 4
Triglycerides: 261 mg/dL — ABNORMAL HIGH (ref 0.0–149.0)
VLDL: 52.2 mg/dL — ABNORMAL HIGH (ref 0.0–40.0)

## 2020-05-22 LAB — LDL CHOLESTEROL, DIRECT: Direct LDL: 81 mg/dL

## 2020-05-22 MED ORDER — FISH OIL 1000 MG PO CAPS
1.0000 | ORAL_CAPSULE | Freq: Two times a day (BID) | ORAL | 0 refills | Status: DC
Start: 2020-05-22 — End: 2021-03-18

## 2020-05-22 MED ORDER — ERGOCALCIFEROL 1.25 MG (50000 UT) PO CAPS: 50000.0000 [IU] | ORAL_CAPSULE | ORAL | 0 refills | Status: DC

## 2020-05-22 MED ORDER — BLOOD GLUCOSE MONITOR KIT: PACK | 0 refills | Status: DC

## 2020-05-22 MED ORDER — EZETIMIBE 10 MG PO TABS: 10.0000 mg | ORAL_TABLET | Freq: Every day | ORAL | 1 refills | Status: DC

## 2020-05-22 NOTE — Patient Instructions (Signed)
-  Nice seeing you today!!  -Lab work today; will notify you once results are available.  -Schedule follow up in 3 months. 

## 2020-05-22 NOTE — Progress Notes (Signed)
Established Patient Office Visit     This visit occurred during the SARS-CoV-2 public health emergency.  Safety protocols were in place, including screening questions prior to the visit, additional usage of staff PPE, and extensive cleaning of exam room while observing appropriate contact time as indicated for disinfecting solutions.    CC/Reason for Visit: 48-month follow-up chronic medical conditions  HPI: Robyn [name] is a 50 y.o. female who is coming in today for the above mentioned reasons. Past Medical History is significant for: Morbid obesity, hypertension, obstructive sleep apnea with CPAP use, type 2 diabetes that has been well controlled, hypothyroidism, stage III chronic kidney disease, depression and chronic gout.  She is motivated to lose weight.  She joined [employer] this week.  She is tracking her food intake.  She is requesting a prescription for glucose monitor today.  She continues to have issues sleeping.  She takes 75 mg of tramadol 1-1/2 tablet at bedtime.  She is due to have labs rechecked as she was started on cholesterol medication at previous visit.  She has had 3 COVID vaccines.   Past Medical/Surgical History: Past Medical History:  Diagnosis Date  . Abnormal Pap smear   . Allergy   . Anxiety    OCCAS PANIC ATTACKS  . Back pain   . Bilateral swelling of feet   . Blood transfusion 1990'S  . Blood transfusion without reported diagnosis   . BV (bacterial vaginosis)   . Cough    nonproductive cough last 2 weeks  . Depression 2010  . Fibroid 2011  . Gallbladder problem   . GERD (gastroesophageal reflux disease)    occasional  . H/O dysmenorrhea   . H/O varicella   . H/O: menorrhagia 2011  . Hypertension   . Hypokalemia    PAST HX  . Hypothyroid   . Increased BMI   . Kidney problem   . Kidney stones   . Obesity   . OSA (obstructive sleep apnea) 04/22/2013  . Ovarian cyst 2011  . Perimenopausal symptoms 07/15/2010  . Pre-diabetes   .  Pregnancy induced hypertension   . Shortness of breath    ONLY WITH ANXIETY  . Sleep apnea    PT USES CPAP SOMETIMES - SETTING IS 3  . Vitamin D deficiency   . Weight loss 2010    Past Surgical History:  Procedure Laterality Date  . C-SECTIONS X 2    . [place]  . CHOLECYSTECTOMY  1994  . COLONOSCOPY WITH PROPOFOL N/A 10/26/2018   Procedure: COLONOSCOPY WITH PROPOFOL;  Surgeon: Juanita Craver, MD;  Location: [facility];  Service: Endoscopy;  Laterality: N/A;  . NEPHROLITHOTOMY  04/09/2011   Procedure: NEPHROLITHOTOMY PERCUTANEOUS;  Surgeon: Molli Hazard, MD;  Location: [facility];  Service: Urology;  Laterality: Right;      . NEPHROLITHOTOMY  10/15/2011   Procedure: NEPHROLITHOTOMY PERCUTANEOUS;  Surgeon: Molli Hazard, MD;  Location: [facility];  Service: Urology;  Laterality: Left;  . PERCUTANEOUS NEPHROSTOMY AROUND 1996  may 2013   right kidney  . REMOVAL OF STONES  10/15/2011   Procedure: REMOVAL OF STONES;  Surgeon: Molli Hazard, MD;  Location: [facility];  Service: Urology;  Laterality: Left;  . TUBAL LIGATION  1999  . URETEROSCOPY  04/09/2011   Procedure: URETEROSCOPY;  Surgeon: Molli Hazard, MD;  Location: [facility];  Service: Urology;  Laterality: Right;  . UTERINE ABLATION MARCH 2010  march 2011  Social History:  reports that she has quit smoking. She has a 3.75 pack-year smoking history. She has never used smokeless tobacco. She reports current alcohol use. She reports that she does not use drugs.  Allergies: Allergies  Allergen Reactions  . Sulfa Antibiotics Hives and Rash    Family History:  Family History  Problem Relation Age of Onset  . Diabetes Mother   . Hypertension Mother   . Obesity Mother   . Hypertension Father   . Diabetes Father   . Kidney disease Father   . Stroke Father   . Sleep apnea Father   . Diabetes Paternal Grandmother   . Diabetes Maternal Grandmother   . Heart disease Maternal Grandmother       Current Outpatient Medications:  .  allopurinol (ZYLOPRIM) 100 MG tablet, Take 100 mg by mouth daily. , Disp: , Rfl:  .  amLODipine (NORVASC) 10 MG tablet, TAKE 1 TABLET BY MOUTH EVERY DAY, Disp: 90 tablet, Rfl: 1 .  atorvastatin (LIPITOR) 80 MG tablet, TAKE 1 TABLET BY MOUTH EVERY DAY, Disp: 90 tablet, Rfl: 1 .  blood glucose meter kit and supplies KIT, Dispense based on patient and insurance preference. Use one time daily as directed. E11.9, Disp: 1 each, Rfl: 0 .  carvedilol (COREG) 25 MG tablet, Take 1 tablet (25 mg total) by mouth 2 (two) times daily with a meal., Disp: 30 tablet, Rfl: 0 .  chlorthalidone (HYGROTON) 25 MG tablet, Take 25 mg by mouth daily., Disp: , Rfl:  .  colchicine 0.6 MG tablet, Take 0.6 mg by mouth daily as needed (gout). , Disp: , Rfl:  .  ergocalciferol (VITAMIN D2) 1.25 MG (50000 UT) capsule, Take 50,000 Units by mouth once a week., Disp: , Rfl:  .  fluticasone (FLONASE) 50 MCG/ACT nasal spray, Place 2 sprays into both nostrils daily., Disp: 16 g, Rfl: 0 .  levothyroxine (SYNTHROID) 25 MCG tablet, TAKE 1 TABLET BY MOUTH EVERY DAY, Disp: 30 tablet, Rfl: 11 .  Multiple Vitamins-Minerals (MULTIVITAMIN WITH MINERALS) tablet, Take 1 tablet by mouth daily., Disp: , Rfl:  .  Semaglutide (RYBELSUS) 7 MG TABS, Take 7 mg by mouth daily., Disp: 90 tablet, Rfl: 1 .  sertraline (ZOLOFT) 50 MG tablet, TAKE 1 TABLET BY MOUTH EVERY DAY, Disp: 90 tablet, Rfl: 1 .  traZODone (DESYREL) 50 MG tablet, TAKE 1.5 TABLETS (75 MG TOTAL) BY MOUTH AT BEDTIME AS NEEDED FOR SLEEP., Disp: 135 tablet, Rfl: 1  Review of Systems:  Constitutional: Denies fever, chills, diaphoresis, appetite change and fatigue.  HEENT: Denies photophobia, eye pain, redness, hearing loss, ear pain, congestion, sore throat, rhinorrhea, sneezing, mouth sores, trouble swallowing, neck pain, neck stiffness and tinnitus.   Respiratory: Denies SOB, DOE, cough, chest tightness,  and wheezing.   Cardiovascular: Denies  chest pain, palpitations and leg swelling.  Gastrointestinal: Denies nausea, vomiting, abdominal pain, diarrhea, constipation, blood in stool and abdominal distention.  Genitourinary: Denies dysuria, urgency, frequency, hematuria, flank pain and difficulty urinating.  Endocrine: Denies: hot or cold intolerance, sweats, changes in hair or nails, polyuria, polydipsia. Musculoskeletal: Denies myalgias, back pain, joint swelling, arthralgias and gait problem.  Skin: Denies pallor, rash and wound.  Neurological: Denies dizziness, seizures, syncope, weakness, light-headedness, numbness and headaches.  Hematological: Denies adenopathy. Easy bruising, personal or family bleeding history  Psychiatric/Behavioral: Denies suicidal ideation, mood changes, confusion, nervousness and agitation    Physical Exam: Vitals:   05/22/20 1037  BP: 130/80  Pulse: 74  Temp: 98.2 F (36.8 C)  TempSrc: Oral  SpO2: 96%  Weight: 277 lb 6.4 oz (125.8 kg)  Height: $Remove'5\' 4"'WViREEI$  (1.626 m)    Body mass index is 47.62 kg/m.   Constitutional: NAD, calm, comfortable, obese Eyes: PERRL, lids and conjunctivae normal ENMT: Mucous membranes are moist.  Respiratory: clear to auscultation bilaterally, no wheezing, no crackles. Normal respiratory effort. No accessory muscle use.  Cardiovascular: Regular rate and rhythm, no murmurs / rubs / gallops. Neurologic: Grossly intact and nonfocal Psychiatric: Normal judgment and insight. Alert and oriented x 3. Normal mood.    Impression and Plan:  Type 2 diabetes mellitus with stage 3a chronic kidney disease, without long-term current use of insulin (HCC)  -A1c today demonstrates good control at 6.0.  Hyperlipidemia associated with type 2 diabetes mellitus ([place])  - Plan: Lipid panel -Goal LDL is less than 70, lipid panel in November with total cholesterol of 290, LDL 176 and triglycerides 332. -She is currently on atorvastatin 80 mg at bedtime and tolerating well.  Vitamin D  deficiency  - Plan: VITAMIN D 25 Hydroxy (Vit-D Deficiency, Fractures)  Morbid obesity (HCC) -Discussed healthy lifestyle, including increased physical activity and better food choices to promote weight loss. -She is in the action stage of weight loss, she has just joined [employer].  OSA on CPAP -Noted  Other insomnia -We have discussed sleep hygiene techniques, she will take melatonin in addition to trazodone and will attempt to turn on her CPAP machine earlier in the night.  Other specified hypothyroidism -TSH was 3.380 in February 2022, she is on levothyroxine.  Stage 3a chronic kidney disease (HCC) -Stable, creatinine was 1.890 in February.    Patient Instructions  -Nice seeing you today!!  -Lab work today; will notify you once results are available.  -Schedule follow up in 3 months.     Lelon Frohlich, MD [facility]

## 2020-05-23 ENCOUNTER — Other Ambulatory Visit: Payer: Self-pay | Admitting: Internal Medicine

## 2020-05-23 DIAGNOSIS — E559 Vitamin D deficiency, unspecified: Secondary | ICD-10-CM

## 2020-06-23 ENCOUNTER — Other Ambulatory Visit: Payer: Self-pay | Admitting: Internal Medicine

## 2020-06-23 DIAGNOSIS — E1122 Type 2 diabetes mellitus with diabetic chronic kidney disease: Secondary | ICD-10-CM

## 2020-06-23 DIAGNOSIS — E559 Vitamin D deficiency, unspecified: Secondary | ICD-10-CM

## 2020-06-26 ENCOUNTER — Other Ambulatory Visit: Payer: Self-pay | Admitting: Internal Medicine

## 2020-06-26 ENCOUNTER — Other Ambulatory Visit: Payer: Self-pay | Admitting: Neurology

## 2020-06-26 DIAGNOSIS — F32A Depression, unspecified: Secondary | ICD-10-CM

## 2020-07-10 ENCOUNTER — Encounter: Payer: Self-pay | Admitting: Internal Medicine

## 2020-07-11 MED ORDER — LEVOTHYROXINE SODIUM 25 MCG PO TABS
25.0000 ug | ORAL_TABLET | Freq: Every day | ORAL | 1 refills | Status: DC
Start: 2020-07-11 — End: 2021-01-15

## 2020-07-14 ENCOUNTER — Other Ambulatory Visit: Payer: Self-pay | Admitting: Internal Medicine

## 2020-07-14 DIAGNOSIS — I10 Essential (primary) hypertension: Secondary | ICD-10-CM

## 2020-07-18 ENCOUNTER — Telehealth: Payer: 59 | Admitting: Adult Health

## 2020-07-18 ENCOUNTER — Telehealth (INDEPENDENT_AMBULATORY_CARE_PROVIDER_SITE_OTHER): Payer: 59 | Admitting: Adult Health

## 2020-07-18 DIAGNOSIS — Z9989 Dependence on other enabling machines and devices: Secondary | ICD-10-CM

## 2020-07-18 DIAGNOSIS — G4733 Obstructive sleep apnea (adult) (pediatric): Secondary | ICD-10-CM | POA: Diagnosis not present

## 2020-07-18 NOTE — Progress Notes (Signed)
PATIENT: Robyn [name] DOB: [birth year]  REASON FOR VISIT: follow up HISTORY FROM: patient PRIMARY NEUROLOGIST:   Virtual Visit via Video Note  I connected with Robyn [name] on 07/18/20 at  8:30 AM EDT by a video enabled telemedicine application located remotely at [facility] and verified that I am speaking with the correct person using two identifiers who was located at their own home.   I discussed the limitations of evaluation and management by telemedicine and the availability of in person appointments. The patient expressed understanding and agreed to proceed.   PATIENT: Robyn [name] DOB: 08/11/1970  REASON FOR VISIT: follow up HISTORY FROM: patient Primary neurologist: Dr. Brett Fairy  HISTORY OF PRESENT ILLNESS: Today 07/18/20:  [name] is a 50 year old female with a history of obstructive sleep apnea on CPAP.  Her download indicates that she use her machine 27 out of 30 days for compliance of 90%.  She use her machine greater than 4 hours 20 days for compliance of 67%.  On average she uses her machine 5 hours and 39 minutes.  Her residual AHI is 1 on 6 to 18 cm water with EPR of 3.  Leak in the 95th percentile is 8.1 L/min.  She reports that when she uses the machine she does find it beneficial.  She denies any new symptoms.  She returns today for an evaluation.  HISTORY 07/26/19:   [name] is a 50 year old female with a history of obstructive sleep apnea on CPAP.  Her download indicates that she use her machine 23 out of 30 days for compliance of 77%.  She use her machine greater than 4 hours 16 days for compliance of 53%.  On average she uses her machine 5 hours and 10 minutes.  Her residual AHI is 0.1 on 6 to 18 cm of water with EPR of 3.  Leak in the 95th percentile is 6.2 L/min.  Reports that the trazodone has been working fairly well there are some nights that she still has a hard time falling asleep and may have to take 75 mg.  REVIEW  OF SYSTEMS: Out of a complete 14 system review of symptoms, the patient complains only of the following symptoms, and all other reviewed systems are negative.  ALLERGIES: Allergies  Allergen Reactions   Sulfa Antibiotics Hives and Rash    HOME MEDICATIONS: Outpatient Medications Prior to Visit  Medication Sig Dispense Refill   allopurinol (ZYLOPRIM) 100 MG tablet Take 100 mg by mouth daily.      amLODipine (NORVASC) 10 MG tablet TAKE 1 TABLET BY MOUTH EVERY DAY 30 tablet 5   atorvastatin (LIPITOR) 80 MG tablet TAKE 1 TABLET BY MOUTH EVERY DAY 90 tablet 1   blood glucose meter kit and supplies KIT Dispense based on patient and insurance preference. Use one time daily as directed. E11.9 1 each 0   carvedilol (COREG) 25 MG tablet Take 1 tablet (25 mg total) by mouth 2 (two) times daily with a meal. 30 tablet 0   chlorthalidone (HYGROTON) 25 MG tablet Take 25 mg by mouth daily.     colchicine 0.6 MG tablet Take 0.6 mg by mouth daily as needed (gout).      ezetimibe (ZETIA) 10 MG tablet Take 1 tablet (10 mg total) by mouth daily. 90 tablet 1   fluticasone (FLONASE) 50 MCG/ACT nasal spray Place 2 sprays into both nostrils daily. 16 g 0   levothyroxine (SYNTHROID) 25 MCG tablet Take 1 tablet (  25 mcg total) by mouth daily. 90 tablet 1   Multiple Vitamins-Minerals (MULTIVITAMIN WITH MINERALS) tablet Take 1 tablet by mouth daily.     Omega-3 Fatty Acids (FISH OIL) 1000 MG CAPS Take 1 capsule (1,000 mg total) by mouth in the morning and at bedtime.  0   RYBELSUS 7 MG TABS TAKE 1 TABLET DAILY 30 tablet 5   sertraline (ZOLOFT) 50 MG tablet TAKE 1 TABLET BY MOUTH EVERY DAY 90 tablet 1   traZODone (DESYREL) 50 MG tablet TAKE 1.5 TABLETS (75 MG TOTAL) BY MOUTH AT BEDTIME AS NEEDED FOR SLEEP. 135 tablet 1   Vitamin D, Ergocalciferol, (DRISDOL) 1.25 MG (50000 UNIT) CAPS capsule TAKE 1 CAPSULE BY MOUTH ONE TIME PER WEEK 4 capsule 2   No facility-administered medications prior to visit.    PAST MEDICAL  HISTORY: Past Medical History:  Diagnosis Date   Abnormal Pap smear    Allergy    Anxiety    OCCAS PANIC ATTACKS   Back pain    Bilateral swelling of feet    Blood transfusion 1990'S   Blood transfusion without reported diagnosis    BV (bacterial vaginosis)    Cough    nonproductive cough last 2 weeks   Depression 2010   Fibroid 2011   Gallbladder problem    GERD (gastroesophageal reflux disease)    occasional   H/O dysmenorrhea    H/O varicella    H/O: menorrhagia 2011   Hypertension    Hypokalemia    PAST HX   Hypothyroid    Increased BMI    Kidney problem    Kidney stones    Obesity    OSA (obstructive sleep apnea) 04/22/2013   Ovarian cyst 2011   Perimenopausal symptoms 07/15/2010   Pre-diabetes    Pregnancy induced hypertension    Shortness of breath    ONLY WITH ANXIETY   Sleep apnea    PT USES CPAP SOMETIMES - SETTING IS 3   Vitamin D deficiency    Weight loss 2010    PAST SURGICAL HISTORY: Past Surgical History:  Procedure Laterality Date   C-SECTIONS X 2     CESAREAN SECTION  1993 and [place]   COLONOSCOPY WITH PROPOFOL N/A 10/26/2018   Procedure: COLONOSCOPY WITH PROPOFOL;  Surgeon: Juanita Craver, MD;  Location: [facility];  Service: Endoscopy;  Laterality: N/A;   NEPHROLITHOTOMY  04/09/2011   Procedure: NEPHROLITHOTOMY PERCUTANEOUS;  Surgeon: Molli Hazard, MD;  Location: [facility];  Service: Urology;  Laterality: Right;       NEPHROLITHOTOMY  10/15/2011   Procedure: NEPHROLITHOTOMY PERCUTANEOUS;  Surgeon: Molli Hazard, MD;  Location: [facility];  Service: Urology;  Laterality: Left;   PERCUTANEOUS NEPHROSTOMY AROUND 1996  may 2013   right kidney   REMOVAL OF STONES  10/15/2011   Procedure: REMOVAL OF STONES;  Surgeon: Molli Hazard, MD;  Location: [facility];  Service: Urology;  Laterality: Left;   TUBAL LIGATION  1999   URETEROSCOPY  04/09/2011   Procedure: URETEROSCOPY;  Surgeon: Molli Hazard, MD;   Location: [facility];  Service: Urology;  Laterality: Right;   UTERINE ABLATION MARCH 2010  march 2011    FAMILY HISTORY: Family History  Problem Relation Age of Onset   Diabetes Mother    Hypertension Mother    Obesity Mother    Hypertension Father    Diabetes Father    Kidney disease Father    Stroke Father    Sleep  apnea Father    Diabetes Paternal Grandmother    Diabetes Maternal Grandmother    Heart disease Maternal Grandmother     SOCIAL HISTORY: Social History   Socioeconomic History   Marital status: Married    Spouse name: Iona Beard   Number of children: 2   Years of education: 15+   Highest education level: Not on file  Occupational History   Occupation: PROGRAM Theme park manager: [employer]  Tobacco Use   Smoking status: Former    Packs/day: 0.25    Years: 15.00    Pack years: 3.75    Types: Cigarettes   Smokeless tobacco: Never  Substance and Sexual Activity   Alcohol use: Yes    Comment: occasional   Drug use: No   Sexual activity: Yes    Birth control/protection: Surgical    Comment: Overlea  Other Topics Concern   Not on file  Social History Narrative   Patient is married Iona Beard) and lives at home with her husband and daughter.   Patient has two children.   Patient is working and attending college full-time.   Patient has a college education.   Patient is right-handed.   Patient drinks two cups of coffee daily and one cup of soda and tea daily.   Social Determinants of Health   Financial Resource Strain: Not on file  Food Insecurity: Not on file  Transportation Needs: Not on file  Physical Activity: Not on file  Stress: Not on file  Social Connections: Not on file  Intimate Partner Violence: Not on file      PHYSICAL EXAM Generalized: Well developed, in no acute distress   Neurological examination  Mentation: Alert oriented to time, place, history taking. Follows all commands speech and language fluent Cranial nerve  II-XII:Extraocular movements were full. Facial symmetry noted. uvula tongue midline. Head turning and shoulder shrug  were normal and symmetric. Motor: Good strength throughout subjectively per patient Sensory: Sensory testing is intact to soft touch on all 4 extremities subjectively per patient Coordination: Cerebellar testing reveals good finger-nose-finger  Gait and station: Patient is able to stand from a seated position. gait is normal.  Reflexes: UTA  DIAGNOSTIC DATA (LABS, IMAGING, TESTING) - I reviewed patient records, labs, notes, testing and imaging myself where available.  Lab Results  Component Value Date   WBC 8.4 11/17/2019   HGB 14.0 11/17/2019   HCT 39.3 11/17/2019   MCV 94.9 11/17/2019   PLT 257 11/17/2019      Component Value Date/Time   NA 136 11/17/2019 0903   NA 138 11/29/2018 0815   K 3.8 11/17/2019 0903   CL 95 (L) 11/17/2019 0903   CO2 31 11/17/2019 0903   GLUCOSE 169 (H) 11/17/2019 0903   BUN 24 11/17/2019 0903   BUN 17 11/29/2018 0815   CREATININE 1.57 (H) 11/17/2019 0903   CALCIUM 9.9 11/17/2019 0903   PROT 6.8 11/17/2019 0903   PROT 6.6 11/29/2018 0815   ALBUMIN 3.9 05/24/2019 1727   ALBUMIN 4.2 11/29/2018 0815   AST 15 11/17/2019 0903   ALT 24 11/17/2019 0903   ALKPHOS 61 05/24/2019 1727   BILITOT 0.8 11/17/2019 0903   BILITOT 0.8 11/29/2018 0815   GFRNONAA 40 (L) 05/24/2019 1727   GFRAA 47 (L) 05/24/2019 1727   Lab Results  Component Value Date   CHOL 204 (H) 05/22/2020   HDL 48.30 05/22/2020   LDLCALC 176 (H) 11/17/2019   LDLDIRECT 81.0 05/22/2020   TRIG  261.0 (H) 05/22/2020   CHOLHDL 4 05/22/2020   Lab Results  Component Value Date   HGBA1C 6.0 (A) 05/22/2020   Lab Results  Component Value Date   VITAMINB12 514 02/21/2020   Lab Results  Component Value Date   TSH 3.38 02/21/2020      ASSESSMENT AND PLAN 50 y.o. year old female  has a past medical history of Abnormal Pap smear, Allergy, Anxiety, Back pain, Bilateral  swelling of feet, Blood transfusion (1990'S), Blood transfusion without reported diagnosis, BV (bacterial vaginosis), Cough, Depression (2010), Fibroid (2011), Gallbladder problem, GERD (gastroesophageal reflux disease), H/O dysmenorrhea, H/O varicella, H/O: menorrhagia (2011), Hypertension, Hypokalemia, Hypothyroid, Increased BMI, Kidney problem, Kidney stones, Obesity, OSA (obstructive sleep apnea) (04/22/2013), Ovarian cyst (2011), Perimenopausal symptoms (07/15/2010), Pre-diabetes, Pregnancy induced hypertension, Shortness of breath, Sleep apnea, Vitamin D deficiency, and Weight loss (2010). here with:  OSA on CPAP  CPAP compliancehas improved Residual AHI is good Encouraged patient to continue using CPAP nightly and > 4 hours each night F/U in 1 year or sooner if needed   Ward Givens, MSN, NP-C 07/18/2020, 8:52 AM [facility] [address] [place], [place] [address] [phone]

## 2020-07-26 ENCOUNTER — Other Ambulatory Visit: Payer: Self-pay | Admitting: Neurology

## 2020-08-22 ENCOUNTER — Ambulatory Visit: Payer: 59 | Admitting: Internal Medicine

## 2020-08-24 ENCOUNTER — Other Ambulatory Visit: Payer: Self-pay | Admitting: Neurology

## 2020-08-31 ENCOUNTER — Encounter: Payer: Self-pay | Admitting: Internal Medicine

## 2020-08-31 ENCOUNTER — Ambulatory Visit: Payer: 59 | Admitting: Internal Medicine

## 2020-08-31 ENCOUNTER — Other Ambulatory Visit: Payer: Self-pay

## 2020-08-31 VITALS — BP 110/80 | HR 78 | Temp 98.5°F | Wt 271.2 lb

## 2020-08-31 DIAGNOSIS — E781 Pure hyperglyceridemia: Secondary | ICD-10-CM | POA: Diagnosis not present

## 2020-08-31 DIAGNOSIS — N1831 Chronic kidney disease, stage 3a: Secondary | ICD-10-CM

## 2020-08-31 DIAGNOSIS — E1122 Type 2 diabetes mellitus with diabetic chronic kidney disease: Secondary | ICD-10-CM | POA: Diagnosis not present

## 2020-08-31 DIAGNOSIS — I10 Essential (primary) hypertension: Secondary | ICD-10-CM

## 2020-08-31 DIAGNOSIS — E559 Vitamin D deficiency, unspecified: Secondary | ICD-10-CM | POA: Diagnosis not present

## 2020-08-31 DIAGNOSIS — E785 Hyperlipidemia, unspecified: Secondary | ICD-10-CM

## 2020-08-31 DIAGNOSIS — E1169 Type 2 diabetes mellitus with other specified complication: Secondary | ICD-10-CM

## 2020-08-31 LAB — POCT GLYCOSYLATED HEMOGLOBIN (HGB A1C): Hemoglobin A1C: 5.8 % — AB (ref 4.0–5.6)

## 2020-08-31 NOTE — Progress Notes (Signed)
Established Patient Office Visit     This visit occurred during the SARS-CoV-2 public health emergency.  Safety protocols were in place, including screening questions prior to the visit, additional usage of staff PPE, and extensive cleaning of exam room while observing appropriate contact time as indicated for disinfecting solutions.    CC/Reason for Visit: 106-month follow-up chronic medical conditions  HPI: Robyn [name] is a 50 y.o. female who is coming in today for the above mentioned reasons. Past Medical History is significant for: Morbid obesity, hypertension, obstructive sleep apnea with CPAP use, type 2 diabetes that has been well controlled, hypothyroidism, stage III chronic kidney disease, depression and chronic gout.  She has been doing well with no acute concerns or complaints.  She has been going to weight watchers and has been able to lose an additional 7 pounds since her last visit.   Past Medical/Surgical History: Past Medical History:  Diagnosis Date   Abnormal Pap smear    Allergy    Anxiety    OCCAS PANIC ATTACKS   Back pain    Bilateral swelling of feet    Blood transfusion 1990'S   Blood transfusion without reported diagnosis    BV (bacterial vaginosis)    Cough    nonproductive cough last 2 weeks   Depression 2010   Fibroid 2011   Gallbladder problem    GERD (gastroesophageal reflux disease)    occasional   H/O dysmenorrhea    H/O varicella    H/O: menorrhagia 2011   Hypertension    Hypokalemia    PAST HX   Hypothyroid    Increased BMI    Kidney problem    Kidney stones    Obesity    OSA (obstructive sleep apnea) 04/22/2013   Ovarian cyst 2011   Perimenopausal symptoms 07/15/2010   Pre-diabetes    Pregnancy induced hypertension    Shortness of breath    ONLY WITH ANXIETY   Sleep apnea    PT USES CPAP SOMETIMES - SETTING IS 3   Vitamin D deficiency    Weight loss 2010    Past Surgical History:  Procedure Laterality Date    C-SECTIONS X 2     CESAREAN SECTION  1993 and [place]   COLONOSCOPY WITH PROPOFOL N/A 10/26/2018   Procedure: COLONOSCOPY WITH PROPOFOL;  Surgeon: Juanita Craver, MD;  [facility];  Service: Endoscopy;  Laterality: N/A;   NEPHROLITHOTOMY  04/09/2011   Procedure: NEPHROLITHOTOMY PERCUTANEOUS;  Surgeon: Molli Hazard, MD;  [facility];  Service: Urology;  Laterality: Right;       NEPHROLITHOTOMY  10/15/2011   Procedure: NEPHROLITHOTOMY PERCUTANEOUS;  Surgeon: Molli Hazard, MD;  [facility];  Service: Urology;  Laterality: Left;   PERCUTANEOUS NEPHROSTOMY AROUND 1996  may 2013   right kidney   REMOVAL OF STONES  10/15/2011   Procedure: REMOVAL OF STONES;  Surgeon: Molli Hazard, MD;  [facility];  Service: Urology;  Laterality: Left;   TUBAL LIGATION  1999   URETEROSCOPY  04/09/2011   Procedure: URETEROSCOPY;  Surgeon: Molli Hazard, MD;  [facility];  Service: Urology;  Laterality: Right;   UTERINE ABLATION MARCH 2010  march 2011    Social History:  reports that she has quit smoking. Her smoking use included cigarettes. She has a 3.75 pack-year smoking history. She has never used smokeless tobacco. She reports current alcohol use. She reports that she does not use  drugs.  Allergies: Allergies  Allergen Reactions   Sulfa Antibiotics Hives and Rash    Family History:  Family History  Problem Relation Age of Onset   Diabetes Mother    Hypertension Mother    Obesity Mother    Hypertension Father    Diabetes Father    Kidney disease Father    Stroke Father    Sleep apnea Father    Diabetes Paternal Grandmother    Diabetes Maternal Grandmother    Heart disease Maternal Grandmother      Current Outpatient Medications:    allopurinol (ZYLOPRIM) 100 MG tablet, Take 100 mg by mouth daily. , Disp: , Rfl:    amLODipine (NORVASC) 10 MG tablet, TAKE 1 TABLET BY MOUTH EVERY DAY, Disp: 30 tablet, Rfl: 5    atorvastatin (LIPITOR) 80 MG tablet, TAKE 1 TABLET BY MOUTH EVERY DAY, Disp: 90 tablet, Rfl: 1   blood glucose meter kit and supplies KIT, Dispense based on patient and insurance preference. Use one time daily as directed. E11.9, Disp: 1 each, Rfl: 0   carvedilol (COREG) 25 MG tablet, Take 1 tablet (25 mg total) by mouth 2 (two) times daily with a meal., Disp: 30 tablet, Rfl: 0   chlorthalidone (HYGROTON) 25 MG tablet, Take 25 mg by mouth daily., Disp: , Rfl:    colchicine 0.6 MG tablet, Take 0.6 mg by mouth daily as needed (gout). , Disp: , Rfl:    ezetimibe (ZETIA) 10 MG tablet, Take 1 tablet (10 mg total) by mouth daily., Disp: 90 tablet, Rfl: 1   levothyroxine (SYNTHROID) 25 MCG tablet, Take 1 tablet (25 mcg total) by mouth daily., Disp: 90 tablet, Rfl: 1   Multiple Vitamins-Minerals (MULTIVITAMIN WITH MINERALS) tablet, Take 1 tablet by mouth daily., Disp: , Rfl:    Omega-3 Fatty Acids (FISH OIL) 1000 MG CAPS, Take 1 capsule (1,000 mg total) by mouth in the morning and at bedtime., Disp: , Rfl: 0   RYBELSUS 7 MG TABS, TAKE 1 TABLET DAILY, Disp: 30 tablet, Rfl: 5   sertraline (ZOLOFT) 50 MG tablet, TAKE 1 TABLET BY MOUTH EVERY DAY, Disp: 90 tablet, Rfl: 1   traZODone (DESYREL) 50 MG tablet, TAKE 1& 1/2 TABLETS (75 MG TOTAL) BY MOUTH AT BEDTIME AS NEEDED FOR SLEEP., Disp: 135 tablet, Rfl: 1   Vitamin D, Ergocalciferol, (DRISDOL) 1.25 MG (50000 UNIT) CAPS capsule, TAKE 1 CAPSULE BY MOUTH ONE TIME PER WEEK, Disp: 4 capsule, Rfl: 2  Review of Systems:  Constitutional: Denies fever, chills, diaphoresis, appetite change and fatigue.  HEENT: Denies photophobia, eye pain, redness, hearing loss, ear pain, congestion, sore throat, rhinorrhea, sneezing, mouth sores, trouble swallowing, neck pain, neck stiffness and tinnitus.   Respiratory: Denies SOB, DOE, cough, chest tightness,  and wheezing.   Cardiovascular: Denies chest pain, palpitations and leg swelling.  Gastrointestinal: Denies nausea, vomiting,  abdominal pain, diarrhea, constipation, blood in stool and abdominal distention.  Genitourinary: Denies dysuria, urgency, frequency, hematuria, flank pain and difficulty urinating.  Endocrine: Denies: hot or cold intolerance, sweats, changes in hair or nails, polyuria, polydipsia. Musculoskeletal: Denies myalgias, back pain, joint swelling, arthralgias and gait problem.  Skin: Denies pallor, rash and wound.  Neurological: Denies dizziness, seizures, syncope, weakness, light-headedness, numbness and headaches.  Hematological: Denies adenopathy. Easy bruising, personal or family bleeding history  Psychiatric/Behavioral: Denies suicidal ideation, mood changes, confusion, nervousness, sleep disturbance and agitation    Physical Exam: Vitals:   08/31/20 0941  BP: 110/80  Pulse: 78  Temp: 98.5 F (36.9  C)  TempSrc: Oral  SpO2: 98%  Weight: 271 lb 3.2 oz (123 kg)    Body mass index is 46.55 kg/m.   Constitutional: NAD, calm, comfortable, obese Eyes: PERRL, lids and conjunctivae normal ENMT: Mucous membranes are moist.  Respiratory: clear to auscultation bilaterally, no wheezing, no crackles. Normal respiratory effort. No accessory muscle use.  Cardiovascular: Regular rate and rhythm, no murmurs / rubs / gallops. No extremity edema.  Neurologic: Grossly intact and nonfocal Psychiatric: Normal judgment and insight. Alert and oriented x 3. Normal mood.    Impression and Plan:  Type 2 diabetes mellitus with stage 3a chronic kidney disease, without long-term current use of insulin ([place])  -In office A1c today is improved at 5.8.  If she continues to lose weight we may discuss dose reduction of some of her medications.  Hyperlipidemia associated with type 2 diabetes mellitus (HCC) Hypertriglyceridemia -Last lipid panel in May 2020 clear with her total cholesterol 204, triglycerides 261 and LDL of 176. -She is on daily atorvastatin.  Vitamin D deficiency -She is taking high-dose  supplementation, recheck vitamin D levels next visit.  Morbid obesity ([place]) -Discussed healthy lifestyle, including increased physical activity and better food choices to promote weight loss.  Primary hypertension -Blood pressure is very well controlled today.  Stage 3a chronic kidney disease (HCC) -Baseline creatinine is around 1.890.  Time spent: 30 minutes reviewing chart, interviewing and examining patient and formulating plan of care.    Lelon Frohlich, MD [facility]

## 2020-10-11 LAB — HM PAP SMEAR

## 2020-10-11 LAB — HM MAMMOGRAPHY

## 2020-11-24 ENCOUNTER — Other Ambulatory Visit: Payer: Self-pay | Admitting: Internal Medicine

## 2020-11-24 DIAGNOSIS — E1169 Type 2 diabetes mellitus with other specified complication: Secondary | ICD-10-CM

## 2020-12-06 ENCOUNTER — Ambulatory Visit: Payer: 59 | Admitting: Internal Medicine

## 2020-12-18 ENCOUNTER — Encounter: Payer: Self-pay | Admitting: Internal Medicine

## 2020-12-18 ENCOUNTER — Ambulatory Visit (INDEPENDENT_AMBULATORY_CARE_PROVIDER_SITE_OTHER): Payer: Self-pay | Admitting: Internal Medicine

## 2020-12-18 VITALS — BP 112/84 | HR 60 | Temp 98.7°F | Ht 64.0 in | Wt 272.6 lb

## 2020-12-18 DIAGNOSIS — I10 Essential (primary) hypertension: Secondary | ICD-10-CM

## 2020-12-18 DIAGNOSIS — Z23 Encounter for immunization: Secondary | ICD-10-CM

## 2020-12-18 DIAGNOSIS — N1831 Chronic kidney disease, stage 3a: Secondary | ICD-10-CM

## 2020-12-18 DIAGNOSIS — E1169 Type 2 diabetes mellitus with other specified complication: Secondary | ICD-10-CM

## 2020-12-18 DIAGNOSIS — Z6841 Body Mass Index (BMI) 40.0 and over, adult: Secondary | ICD-10-CM

## 2020-12-18 DIAGNOSIS — E785 Hyperlipidemia, unspecified: Secondary | ICD-10-CM

## 2020-12-18 DIAGNOSIS — E1122 Type 2 diabetes mellitus with diabetic chronic kidney disease: Secondary | ICD-10-CM

## 2020-12-18 LAB — POCT GLYCOSYLATED HEMOGLOBIN (HGB A1C): Hemoglobin A1C: 6.2 % — AB (ref 4.0–5.6)

## 2020-12-18 MED ORDER — TIRZEPATIDE 2.5 MG/0.5ML ~~LOC~~ SOAJ: 2.5000 mg | SUBCUTANEOUS | 0 refills | Status: DC

## 2020-12-18 NOTE — Progress Notes (Signed)
Established Patient Office Visit     This visit occurred during the SARS-CoV-2 public health emergency.  Safety protocols were in place, including screening questions prior to the visit, additional usage of staff PPE, and extensive cleaning of exam room while observing appropriate contact time as indicated for disinfecting solutions.    CC/Reason for Visit: 4-monthfollow-up chronic medical conditions  HPI: Robyn TROVATOis a 50y.o. female who is coming in today for the above mentioned reasons. Past Medical History is significant for: Morbid obesity, hypertension, obstructive sleep apnea with CPAP use, type 2 diabetes that has been well controlled, hypothyroidism, stage III chronic kidney disease, depression and chronic gout.  She is concerned because her weight loss has plateaued, she has fallen off the wagon with her lifestyle changes.  She feels fatigued.  She is requesting a flu vaccine.   Past Medical/Surgical History: Past Medical History:  Diagnosis Date   Abnormal Pap smear    Allergy    Anxiety    OCCAS PANIC ATTACKS   Back pain    Bilateral swelling of feet    Blood transfusion 1990'S   Blood transfusion without reported diagnosis    BV (bacterial vaginosis)    Cough    nonproductive cough last 2 weeks   Depression 2010   Fibroid 2011   Gallbladder problem    GERD (gastroesophageal reflux disease)    occasional   H/O dysmenorrhea    H/O varicella    H/O: menorrhagia 2011   Hypertension    Hypokalemia    PAST HX   Hypothyroid    Increased BMI    Kidney problem    Kidney stones    Obesity    OSA (obstructive sleep apnea) 04/22/2013   Ovarian cyst 2011   Perimenopausal symptoms 07/15/2010   Pre-diabetes    Pregnancy induced hypertension    Shortness of breath    ONLY WITH ANXIETY   Sleep apnea    PT USES CPAP SOMETIMES - SETTING IS 3   Vitamin D deficiency    Weight loss 2010    Past Surgical History:  Procedure Laterality Date   C-SECTIONS X  2     CESAREAN SECTION  1993 and 1[name]  COLONOSCOPY WITH PROPOFOL N/A 10/26/2018   Procedure: COLONOSCOPY WITH PROPOFOL;  Surgeon: MJuanita Craver MD;  Location: [facility];  Service: Endoscopy;  Laterality: N/A;   NEPHROLITHOTOMY  04/09/2011   Procedure: NEPHROLITHOTOMY PERCUTANEOUS;  Surgeon: DMolli Hazard MD;  Location: [facility];  Service: Urology;  Laterality: Right;       NEPHROLITHOTOMY  10/15/2011   Procedure: NEPHROLITHOTOMY PERCUTANEOUS;  Surgeon: DMolli Hazard MD;  Location: [facility];  Service: Urology;  Laterality: Left;   PERCUTANEOUS NEPHROSTOMY AROUND 1996  may 2013   right kidney   REMOVAL OF STONES  10/15/2011   Procedure: REMOVAL OF STONES;  Surgeon: DMolli Hazard MD;  Location: [facility];  Service: Urology;  Laterality: Left;   TUBAL LIGATION  1999   URETEROSCOPY  04/09/2011   Procedure: URETEROSCOPY;  Surgeon: DMolli Hazard MD;  Location: [facility];  Service: Urology;  Laterality: Right;   UTERINE ABLATION MARCH 2010  march 2011    Social History:  reports that she has quit smoking. Her smoking use included cigarettes. She has a 3.75 pack-year smoking history. She has never used smokeless tobacco. She reports current alcohol use. She reports that she does not use drugs.  Allergies:  Allergies  Allergen Reactions   Sulfa Antibiotics Hives and Rash    Family History:  Family History  Problem Relation Age of Onset   Diabetes Mother    Hypertension Mother    Obesity Mother    Hypertension Father    Diabetes Father    Kidney disease Father    Stroke Father    Sleep apnea Father    Diabetes Paternal Grandmother    Diabetes Maternal Grandmother    Heart disease Maternal Grandmother      Current Outpatient Medications:    allopurinol (ZYLOPRIM) 100 MG tablet, Take 100 mg by mouth daily. , Disp: , Rfl:    amLODipine (NORVASC) 10 MG tablet, TAKE 1 TABLET BY MOUTH EVERY DAY, Disp: 30 tablet, Rfl: 5   atorvastatin  (LIPITOR) 80 MG tablet, TAKE 1 TABLET BY MOUTH EVERY DAY, Disp: 30 tablet, Rfl: 5   blood glucose meter kit and supplies KIT, Dispense based on patient and insurance preference. Use one time daily as directed. E11.9, Disp: 1 each, Rfl: 0   carvedilol (COREG) 25 MG tablet, Take 1 tablet (25 mg total) by mouth 2 (two) times daily with a meal., Disp: 30 tablet, Rfl: 0   chlorthalidone (HYGROTON) 25 MG tablet, Take 25 mg by mouth daily., Disp: , Rfl:    colchicine 0.6 MG tablet, Take 0.6 mg by mouth daily as needed (gout). , Disp: , Rfl:    ezetimibe (ZETIA) 10 MG tablet, TAKE 1 TABLET BY MOUTH EVERY DAY, Disp: 30 tablet, Rfl: 5   levothyroxine (SYNTHROID) 25 MCG tablet, Take 1 tablet (25 mcg total) by mouth daily., Disp: 90 tablet, Rfl: 1   Multiple Vitamins-Minerals (MULTIVITAMIN WITH MINERALS) tablet, Take 1 tablet by mouth daily., Disp: , Rfl:    Omega-3 Fatty Acids (FISH OIL) 1000 MG CAPS, Take 1 capsule (1,000 mg total) by mouth in the morning and at bedtime., Disp: , Rfl: 0   sertraline (ZOLOFT) 50 MG tablet, TAKE 1 TABLET BY MOUTH EVERY DAY, Disp: 90 tablet, Rfl: 1   tirzepatide (MOUNJARO) 2.5 MG/0.5ML Pen, Inject 2.5 mg into the skin once a week., Disp: 6 mL, Rfl: 0   traZODone (DESYREL) 50 MG tablet, TAKE 1& 1/2 TABLETS (75 MG TOTAL) BY MOUTH AT BEDTIME AS NEEDED FOR SLEEP., Disp: 135 tablet, Rfl: 1   Vitamin D, Ergocalciferol, (DRISDOL) 1.25 MG (50000 UNIT) CAPS capsule, TAKE 1 CAPSULE BY MOUTH ONE TIME PER WEEK, Disp: 4 capsule, Rfl: 2  Review of Systems:  Constitutional: Denies fever, chills, diaphoresis, appetite change. HEENT: Denies photophobia, eye pain, redness, hearing loss, ear pain, congestion, sore throat, rhinorrhea, sneezing, mouth sores, trouble swallowing, neck pain, neck stiffness and tinnitus.   Respiratory: Denies SOB, DOE, cough, chest tightness,  and wheezing.   Cardiovascular: Denies chest pain, palpitations and leg swelling.  Gastrointestinal: Denies nausea, vomiting,  abdominal pain, diarrhea, constipation, blood in stool and abdominal distention.  Genitourinary: Denies dysuria, urgency, frequency, hematuria, flank pain and difficulty urinating.  Endocrine: Denies: hot or cold intolerance, sweats, changes in hair or nails, polyuria, polydipsia. Musculoskeletal: Denies myalgias, back pain, joint swelling, arthralgias and gait problem.  Skin: Denies pallor, rash and wound.  Neurological: Denies dizziness, seizures, syncope, weakness, light-headedness, numbness and headaches.  Hematological: Denies adenopathy. Easy bruising, personal or family bleeding history  Psychiatric/Behavioral: Denies suicidal ideation, mood changes, confusion, nervousness, sleep disturbance and agitation    Physical Exam: Vitals:   12/18/20 1534  BP: 112/84  Pulse: 60  Temp: 98.7 F (37.1 C)  TempSrc: Oral  SpO2: 98%  Weight: 272 lb 9.6 oz (123.7 kg)  Height: _0  (1.626 m)    Body mass index is 46.79 kg/m.   Constitutional: NAD, calm, comfortable, obese Eyes: PERRL, lids and conjunctivae normal ENMT: Mucous membranes are moist.  Respiratory: clear to auscultation bilaterally, no wheezing, no crackles. Normal respiratory effort. No accessory muscle use.  Cardiovascular: Regular rate and rhythm, no murmurs / rubs / gallops. No extremity edema.  Neurologic: Grossly intact and nonfocal Psychiatric: Normal judgment and insight. Alert and oriented x 3. Normal mood.    Impression and Plan:  Type 2 diabetes mellitus with stage 3a chronic kidney disease, without long-term current use of insulin ([place])  - Plan: POCT glycosylated hemoglobin (Hb A1C), tirzepatide (MOUNJARO) 2.5 MG/0.5ML Pen -A1c has increased a little to 6.2 but certainly well within goal. -I believe she would do better on mounjaro as opposed to Rybelsus.  I will make the switch today. -Start 2.5 mg weekly, follow-up in 3 months.  Hyperlipidemia associated with type 2 diabetes mellitus ([place]) -She is due to  have lipids rechecked next visit.  Class 3 severe obesity with serious comorbidity and body mass index (BMI) of 45.0 to 49.9 in adult, unspecified obesity type ([place]) -Discussed healthy lifestyle, including increased physical activity and better food choices to promote weight loss.  Primary hypertension -Blood pressures well controlled.  Stage 3a chronic kidney disease (H[facility]) -Baseline creatinine is around 1.9.  Need for influenza vaccine -Flu vaccine administered today.  Time spent: 33 minutes reviewing chart, interviewing and examining patient and formulating plan of care.   Patient Instructions  -Nice seeing you today!!  -Stop Rybelsus.  -Start Mounjaro 2.5 mg injected weekly.  -Schedule follow up in 3 months.    Lelon Frohlich, MD [facility]

## 2020-12-18 NOTE — Patient Instructions (Signed)
-  Nice seeing you today!!  -Stop Rybelsus.  -Start Mounjaro 2.5 mg injected weekly.  -Schedule follow up in 3 months.

## 2020-12-19 ENCOUNTER — Ambulatory Visit: Payer: 59 | Admitting: Internal Medicine

## 2020-12-23 ENCOUNTER — Other Ambulatory Visit: Payer: Self-pay | Admitting: Internal Medicine

## 2020-12-23 DIAGNOSIS — F419 Anxiety disorder, unspecified: Secondary | ICD-10-CM

## 2021-01-08 ENCOUNTER — Encounter: Payer: Self-pay | Admitting: Internal Medicine

## 2021-01-09 NOTE — Telephone Encounter (Signed)
PA completed on covermymeds.  FYI  Key: [id number]  PA Case ID: [id number]  Rx #: [id number]

## 2021-01-10 ENCOUNTER — Other Ambulatory Visit: Payer: Self-pay | Admitting: Adult Health

## 2021-01-14 ENCOUNTER — Other Ambulatory Visit: Payer: Self-pay | Admitting: Internal Medicine

## 2021-01-14 LAB — BASIC METABOLIC PANEL
Chloride: 100 (ref 99–108)
Creatinine: 2.2 — AB (ref 0.5–1.1)
Glucose: 104
Potassium: 3.8 (ref 3.4–5.3)
Sodium: 139 (ref 137–147)

## 2021-01-14 LAB — COMPREHENSIVE METABOLIC PANEL
Albumin: 4.2 (ref 3.5–5.0)
Calcium: 9.9 (ref 8.7–10.7)
GFR calc Af Amer: 26

## 2021-01-14 LAB — CBC AND DIFFERENTIAL
HCT: 38 (ref 36–46)
Hemoglobin: 13 (ref 12.0–16.0)
Neutrophils Absolute: 4.2
Platelets: 319 (ref 150–399)
WBC: 6.8

## 2021-01-14 LAB — CBC: RBC: 4.13 (ref 3.87–5.11)

## 2021-01-17 ENCOUNTER — Other Ambulatory Visit: Payer: Self-pay | Admitting: Internal Medicine

## 2021-01-17 DIAGNOSIS — N1831 Chronic kidney disease, stage 3a: Secondary | ICD-10-CM

## 2021-01-17 DIAGNOSIS — E1122 Type 2 diabetes mellitus with diabetic chronic kidney disease: Secondary | ICD-10-CM

## 2021-01-17 MED ORDER — OZEMPIC (0.25 OR 0.5 MG/DOSE) 2 MG/1.5ML ~~LOC~~ SOPN: 0.5000 mg | PEN_INJECTOR | SUBCUTANEOUS | 2 refills | Status: DC

## 2021-01-25 ENCOUNTER — Encounter: Payer: Self-pay | Admitting: Internal Medicine

## 2021-01-29 ENCOUNTER — Other Ambulatory Visit: Payer: Self-pay | Admitting: Internal Medicine

## 2021-01-29 DIAGNOSIS — I10 Essential (primary) hypertension: Secondary | ICD-10-CM

## 2021-02-04 ENCOUNTER — Encounter: Payer: Self-pay | Admitting: Adult Health

## 2021-02-21 ENCOUNTER — Encounter: Payer: Self-pay | Admitting: Internal Medicine

## 2021-03-18 ENCOUNTER — Ambulatory Visit (INDEPENDENT_AMBULATORY_CARE_PROVIDER_SITE_OTHER): Payer: 59 | Admitting: Internal Medicine

## 2021-03-18 ENCOUNTER — Encounter: Payer: Self-pay | Admitting: Internal Medicine

## 2021-03-18 VITALS — BP 130/86 | HR 64 | Temp 98.7°F | Wt 267.8 lb

## 2021-03-18 DIAGNOSIS — G4733 Obstructive sleep apnea (adult) (pediatric): Secondary | ICD-10-CM

## 2021-03-18 DIAGNOSIS — E785 Hyperlipidemia, unspecified: Secondary | ICD-10-CM

## 2021-03-18 DIAGNOSIS — N1831 Chronic kidney disease, stage 3a: Secondary | ICD-10-CM

## 2021-03-18 DIAGNOSIS — E1169 Type 2 diabetes mellitus with other specified complication: Secondary | ICD-10-CM | POA: Diagnosis not present

## 2021-03-18 DIAGNOSIS — I10 Essential (primary) hypertension: Secondary | ICD-10-CM | POA: Diagnosis not present

## 2021-03-18 DIAGNOSIS — E1122 Type 2 diabetes mellitus with diabetic chronic kidney disease: Secondary | ICD-10-CM

## 2021-03-18 DIAGNOSIS — E038 Other specified hypothyroidism: Secondary | ICD-10-CM

## 2021-03-18 LAB — POCT GLYCOSYLATED HEMOGLOBIN (HGB A1C): Hemoglobin A1C: 6 % — AB (ref 4.0–5.6)

## 2021-03-18 MED ORDER — BLOOD GLUCOSE MONITOR KIT: PACK | 0 refills | Status: AC

## 2021-03-18 NOTE — Progress Notes (Signed)
Established Patient Office Visit     This visit occurred during the SARS-CoV-2 public health emergency.  Safety protocols were in place, including screening questions prior to the visit, additional usage of staff PPE, and extensive cleaning of exam room while observing appropriate contact time as indicated for disinfecting solutions.    CC/Reason for Visit: 39-monthfollow-up chronic medical conditions  HPI: Robyn OSBOURNis a 51y.o. female who is coming in today for the above mentioned reasons. Past Medical History is significant for: Morbid obesity, hypertension, obstructive sleep apnea, type 2 diabetes, hypothyroidism, stage III chronic kidney disease, depression, gout.  She has been doing well and has no acute concerns or complaints.  She is now taking 0.5 mg of Ozempic and is tolerating it well.  She has lost an additional 6 pounds since her last in office visit.  She is overdue for CPE.   Past Medical/Surgical History: Past Medical History:  Diagnosis Date   Abnormal Pap smear    Allergy    Anxiety    OCCAS PANIC ATTACKS   Back pain    Bilateral swelling of feet    Blood transfusion 1990'S   Blood transfusion without reported diagnosis    BV (bacterial vaginosis)    Cough    nonproductive cough last 2 weeks   Depression 2010   Fibroid 2011   Gallbladder problem    GERD (gastroesophageal reflux disease)    occasional   H/O dysmenorrhea    H/O varicella    H/O: menorrhagia 2011   Hypertension    Hypokalemia    PAST HX   Hypothyroid    Increased BMI    Kidney problem    Kidney stones    Obesity    OSA (obstructive sleep apnea) 04/22/2013   Ovarian cyst 2011   Perimenopausal symptoms 07/15/2010   Pre-diabetes    Pregnancy induced hypertension    Shortness of breath    ONLY WITH ANXIETY   Sleep apnea    PT USES CPAP SOMETIMES - SETTING IS 3   Vitamin D deficiency    Weight loss 2010    Past Surgical History:  Procedure Laterality Date   C-SECTIONS X  2     CESAREAN SECTION  1993 and 1[facility]  COLONOSCOPY WITH PROPOFOL N/A 10/26/2018   Procedure: COLONOSCOPY WITH PROPOFOL;  Surgeon: MJuanita Craver MD;  Location: [facility];  Service: Endoscopy;  Laterality: N/A;   NEPHROLITHOTOMY  04/09/2011   Procedure: NEPHROLITHOTOMY PERCUTANEOUS;  Surgeon: DMolli Hazard MD;  [facility]: [facility];  Service: Urology;  Laterality: Right;       NEPHROLITHOTOMY  10/15/2011   Procedure: NEPHROLITHOTOMY PERCUTANEOUS;  Surgeon: DMolli Hazard MD;  Location: [facility];  Service: Urology;  Laterality: Left;   PERCUTANEOUS NEPHROSTOMY AROUND 1996  may 2013   right kidney   REMOVAL OF STONES  10/15/2011   Procedure: REMOVAL OF STONES;  Surgeon: DMolli Hazard MD;  Location: [facility];  Service: Urology;  Laterality: Left;   TUBAL LIGATION  1999   URETEROSCOPY  04/09/2011   Procedure: URETEROSCOPY;  Surgeon: DMolli Hazard MD;  Location: [facility];  Service: Urology;  Laterality: Right;   UTERINE ABLATION MARCH 2010  march 2011    Social History:  reports that she has quit smoking. Her smoking use included cigarettes. She has a 3.75 pack-year smoking history. She has never used smokeless tobacco. She reports current alcohol use. She reports that she  does not use drugs.  Allergies: Allergies  Allergen Reactions   Sulfa Antibiotics Hives and Rash    Family History:  Family History  Problem Relation Age of Onset   Diabetes Mother    Hypertension Mother    Obesity Mother    Hypertension Father    Diabetes Father    Kidney disease Father    Stroke Father    Sleep apnea Father    Diabetes Paternal Grandmother    Diabetes Maternal Grandmother    Heart disease Maternal Grandmother      Current Outpatient Medications:    allopurinol (ZYLOPRIM) 100 MG tablet, Take 100 mg by mouth daily. , Disp: , Rfl:    amLODipine (NORVASC) 10 MG tablet, TAKE 1 TABLET BY MOUTH EVERY DAY, Disp: 30 tablet, Rfl: 5   atorvastatin  (LIPITOR) 80 MG tablet, TAKE 1 TABLET BY MOUTH EVERY DAY, Disp: 30 tablet, Rfl: 5   carvedilol (COREG) 25 MG tablet, Take 1 tablet (25 mg total) by mouth 2 (two) times daily with a meal., Disp: 30 tablet, Rfl: 0   chlorthalidone (HYGROTON) 25 MG tablet, Take 25 mg by mouth daily., Disp: , Rfl:    colchicine 0.6 MG tablet, Take 0.6 mg by mouth daily as needed (gout). , Disp: , Rfl:    ezetimibe (ZETIA) 10 MG tablet, TAKE 1 TABLET BY MOUTH EVERY DAY, Disp: 30 tablet, Rfl: 5   levothyroxine (SYNTHROID) 25 MCG tablet, Take 1 tablet (25 mcg total) by mouth daily., Disp: 90 tablet, Rfl: 1   Multiple Vitamins-Minerals (MULTIVITAMIN WITH MINERALS) tablet, Take 1 tablet by mouth daily., Disp: , Rfl:    Semaglutide,0.25 or 0.5MG/DOS, (OZEMPIC, 0.25 OR 0.5 MG/DOSE,) 2 MG/1.5ML SOPN, Inject 0.5 mg into the skin once a week. Start with 0.25 mg weekly x 4 and then increase to 0.5 mg weekly., Disp: 1.5 mL, Rfl: 2   sertraline (ZOLOFT) 50 MG tablet, TAKE 1 TABLET BY MOUTH EVERY DAY, Disp: 90 tablet, Rfl: 1   traZODone (DESYREL) 50 MG tablet, TAKE 1& 1/2 TABLETS (75 MG TOTAL) BY MOUTH AT BEDTIME AS NEEDED FOR SLEEP., Disp: 135 tablet, Rfl: 1   Vitamin D, Ergocalciferol, (DRISDOL) 1.25 MG (50000 UNIT) CAPS capsule, TAKE 1 CAPSULE BY MOUTH ONE TIME PER WEEK, Disp: 4 capsule, Rfl: 2   blood glucose meter kit and supplies KIT, Dispense based on patient and insurance preference. Use one time daily as directed. E11.9, Disp: 1 each, Rfl: 0  Review of Systems:  Constitutional: Denies fever, chills, diaphoresis, appetite change and fatigue.  HEENT: Denies photophobia, eye pain, redness, hearing loss, ear pain, congestion, sore throat, rhinorrhea, sneezing, mouth sores, trouble swallowing, neck pain, neck stiffness and tinnitus.   Respiratory: Denies SOB, DOE, cough, chest tightness,  and wheezing.   Cardiovascular: Denies chest pain, palpitations and leg swelling.  Gastrointestinal: Denies nausea, vomiting, abdominal pain,  diarrhea, constipation, blood in stool and abdominal distention.  Genitourinary: Denies dysuria, urgency, frequency, hematuria, flank pain and difficulty urinating.  Endocrine: Denies: hot or cold intolerance, sweats, changes in hair or nails, polyuria, polydipsia. Musculoskeletal: Denies myalgias, back pain, joint swelling, arthralgias and gait problem.  Skin: Denies pallor, rash and wound.  Neurological: Denies dizziness, seizures, syncope, weakness, light-headedness, numbness and headaches.  Hematological: Denies adenopathy. Easy bruising, personal or family bleeding history  Psychiatric/Behavioral: Denies suicidal ideation, mood changes, confusion, nervousness, sleep disturbance and agitation    Physical Exam: Vitals:   03/18/21 1445  BP: 130/86  Pulse: 64  Temp: 98.7 F (37.1 C)  TempSrc: Oral  SpO2: 98%  Weight: 267 lb 12.8 oz (121.5 kg)    Body mass index is 45.97 kg/m.   Constitutional: NAD, calm, comfortable Eyes: PERRL, lids and conjunctivae normal ENMT: Mucous membranes are moist.  Respiratory: clear to auscultation bilaterally, no wheezing, no crackles. Normal respiratory effort. No accessory muscle use.  Cardiovascular: Regular rate and rhythm, no murmurs / rubs / gallops. No extremity edema.  Neurologic: Grossly intact and nonfocal Psychiatric: Normal judgment and insight. Alert and oriented x 3. Normal mood.    Impression and Plan:  Type 2 diabetes mellitus with stage 3a chronic kidney disease, without long-term current use of insulin ([place])  - Plan: POCT glycosylated hemoglobin (Hb A1C) -A1c demonstrates good control at 6.0. -Continue Ozempic 0.5 mg.  Hyperlipidemia associated with type 2 diabetes mellitus ([place]) -Overdue for lipid recheck, she is on maximal Lipitor dose and ezetimibe 10 mg.  Morbid obesity ([place]) -Discussed healthy lifestyle, including increased physical activity and better food choices to promote weight loss.  Primary  hypertension -Fair control.  Other specified hypothyroidism -Check TSH when she returns for CPE  OSA (obstructive sleep apnea) -Noted  Time spent: 33 minutes reviewing chart, interviewing and examining patient and formulating plan of care.     Lelon Frohlich, MD [facility]

## 2021-03-21 ENCOUNTER — Encounter: Payer: Self-pay | Admitting: Internal Medicine

## 2021-03-26 ENCOUNTER — Other Ambulatory Visit: Payer: Self-pay | Admitting: Internal Medicine

## 2021-03-26 DIAGNOSIS — E1122 Type 2 diabetes mellitus with diabetic chronic kidney disease: Secondary | ICD-10-CM

## 2021-04-04 ENCOUNTER — Other Ambulatory Visit: Payer: Self-pay | Admitting: Internal Medicine

## 2021-04-04 DIAGNOSIS — E1169 Type 2 diabetes mellitus with other specified complication: Secondary | ICD-10-CM

## 2021-04-08 ENCOUNTER — Encounter: Payer: 59 | Admitting: Internal Medicine

## 2021-05-15 ENCOUNTER — Encounter: Payer: Self-pay | Admitting: Internal Medicine

## 2021-05-15 ENCOUNTER — Ambulatory Visit (INDEPENDENT_AMBULATORY_CARE_PROVIDER_SITE_OTHER): Payer: 59 | Admitting: Internal Medicine

## 2021-05-15 VITALS — BP 136/90 | HR 87 | Temp 98.2°F | Ht 63.5 in | Wt 266.6 lb

## 2021-05-15 DIAGNOSIS — E785 Hyperlipidemia, unspecified: Secondary | ICD-10-CM | POA: Diagnosis not present

## 2021-05-15 DIAGNOSIS — I10 Essential (primary) hypertension: Secondary | ICD-10-CM

## 2021-05-15 DIAGNOSIS — N1831 Chronic kidney disease, stage 3a: Secondary | ICD-10-CM | POA: Diagnosis not present

## 2021-05-15 DIAGNOSIS — Z9989 Dependence on other enabling machines and devices: Secondary | ICD-10-CM

## 2021-05-15 DIAGNOSIS — E1122 Type 2 diabetes mellitus with diabetic chronic kidney disease: Secondary | ICD-10-CM

## 2021-05-15 DIAGNOSIS — G4733 Obstructive sleep apnea (adult) (pediatric): Secondary | ICD-10-CM

## 2021-05-15 DIAGNOSIS — Z1159 Encounter for screening for other viral diseases: Secondary | ICD-10-CM

## 2021-05-15 DIAGNOSIS — E1169 Type 2 diabetes mellitus with other specified complication: Secondary | ICD-10-CM | POA: Diagnosis not present

## 2021-05-15 DIAGNOSIS — Z0000 Encounter for general adult medical examination without abnormal findings: Secondary | ICD-10-CM | POA: Diagnosis not present

## 2021-05-15 DIAGNOSIS — E038 Other specified hypothyroidism: Secondary | ICD-10-CM | POA: Diagnosis not present

## 2021-05-15 LAB — LIPID PANEL
Cholesterol: 154 mg/dL (ref 0–200)
HDL: 49.3 mg/dL (ref >39.00)
NonHDL: 104.57
Total CHOL/HDL Ratio: 3
Triglycerides: 378 mg/dL — ABNORMAL HIGH (ref 0.0–149.0)
VLDL: 75.6 mg/dL — ABNORMAL HIGH (ref 0.0–40.0)

## 2021-05-15 LAB — COMPREHENSIVE METABOLIC PANEL
ALT: 27 U/L (ref 0–35)
AST: 18 U/L (ref 0–37)
Albumin: 4.5 g/dL (ref 3.5–5.2)
Alkaline Phosphatase: 86 U/L (ref 39–117)
BUN: 35 mg/dL — ABNORMAL HIGH (ref 6–23)
CO2: 23 mEq/L (ref 19–32)
Calcium: 9.6 mg/dL (ref 8.4–10.5)
Chloride: 100 mEq/L (ref 96–112)
Creatinine, Ser: 2.18 mg/dL — ABNORMAL HIGH (ref 0.40–1.20)
GFR: 25.91 mL/min — ABNORMAL LOW (ref >60.00)
Glucose, Bld: 116 mg/dL — ABNORMAL HIGH (ref 70–99)
Potassium: 3.8 mEq/L (ref 3.5–5.1)
Sodium: 137 mEq/L (ref 135–145)
Total Bilirubin: 0.9 mg/dL (ref 0.2–1.2)
Total Protein: 7.8 g/dL (ref 6.0–8.3)

## 2021-05-15 LAB — MICROALBUMIN / CREATININE URINE RATIO
Creatinine,U: 160.8 mg/dL
Microalb Creat Ratio: 81 mg/g — ABNORMAL HIGH (ref 0.0–30.0)
Microalb, Ur: 130.3 mg/dL — ABNORMAL HIGH (ref 0.0–1.9)

## 2021-05-15 LAB — CBC WITH DIFFERENTIAL/PLATELET
Basophils Absolute: 0.1 10*3/uL (ref 0.0–0.1)
Basophils Relative: 0.7 % (ref 0.0–3.0)
Eosinophils Absolute: 0.3 10*3/uL (ref 0.0–0.7)
Eosinophils Relative: 3.9 % (ref 0.0–5.0)
HCT: 39 % (ref 36.0–46.0)
Hemoglobin: 13.5 g/dL (ref 12.0–15.0)
Lymphocytes Relative: 18.3 % (ref 12.0–46.0)
Lymphs Abs: 1.4 10*3/uL (ref 0.7–4.0)
MCHC: 34.7 g/dL (ref 30.0–36.0)
MCV: 92.4 fl (ref 78.0–100.0)
Monocytes Absolute: 0.5 10*3/uL (ref 0.1–1.0)
Monocytes Relative: 6.4 % (ref 3.0–12.0)
Neutro Abs: 5.5 10*3/uL (ref 1.4–7.7)
Neutrophils Relative %: 70.7 % (ref 43.0–77.0)
Platelets: 275 10*3/uL (ref 150.0–400.0)
RBC: 4.22 Mil/uL (ref 3.87–5.11)
RDW: 13.9 % (ref 11.5–15.5)
WBC: 7.8 10*3/uL (ref 4.0–10.5)

## 2021-05-15 LAB — HEMOGLOBIN A1C: Hgb A1c MFr Bld: 5.6 % (ref 4.6–6.5)

## 2021-05-15 LAB — VITAMIN B12: Vitamin B-12: 342 pg/mL (ref 211–911)

## 2021-05-15 LAB — LDL CHOLESTEROL, DIRECT: Direct LDL: 43 mg/dL

## 2021-05-15 LAB — VITAMIN D 25 HYDROXY (VIT D DEFICIENCY, FRACTURES): VITD: 38.24 ng/mL (ref 30.00–100.00)

## 2021-05-15 LAB — TSH: TSH: 2.68 u[IU]/mL (ref 0.35–5.50)

## 2021-05-15 MED ORDER — SEMAGLUTIDE (1 MG/DOSE) 4 MG/3ML ~~LOC~~ SOPN: 1.0000 mg | PEN_INJECTOR | SUBCUTANEOUS | 0 refills | Status: DC

## 2021-05-15 MED ORDER — VALSARTAN-HYDROCHLOROTHIAZIDE 160-25 MG PO TABS: 1.0000 | ORAL_TABLET | Freq: Every day | ORAL | 3 refills | Status: DC

## 2021-05-15 NOTE — Patient Instructions (Signed)
-  Nice seeing you today!! ? ?-Lab work today; will notify you once results are available. ? ?-Remember your COVID booster at the pharmacy. ? ?-Stop chlorthalidone. ? ?-Start valsartan HCT 160/25 mg daily. ? ?-Increase Ozempic to 1 mg injected weekly. ? ?-Schedule follow up in 3 months. ? ? ?

## 2021-05-15 NOTE — Progress Notes (Signed)
? ? ? ?Established Patient Office Visit ? ? ? ? ?CC/Reason for Visit: Annual preventive exam, follow-up chronic medical conditions ? ?HPI: Robyn [name] is a 51 y.o. female who is coming in today for the above mentioned reasons. Past Medical History is significant for: Morbid obesity, hypertension, hyperlipidemia, type 2 diabetes, stage III chronic kidney disease, obstructive sleep apnea on CPAP, hypothyroidism, gout and depression.  She is doing well.  She has routine eye and dental care.  She has not been exercising routinely.  She is overdue for a COVID vaccination.  Mammogram colonoscopy and Pap smear are up-to-date.  Blood pressure is above goal. ? ? ?Past Medical/Surgical History: ?Past Medical History:  ?Diagnosis Date  ? Abnormal Pap smear   ? Allergy   ? Anxiety   ? OCCAS PANIC ATTACKS  ? Back pain   ? Bilateral swelling of feet   ? Blood transfusion 1990'S  ? Blood transfusion without reported diagnosis   ? BV (bacterial vaginosis)   ? Cough   ? nonproductive cough last 2 weeks  ? Depression 2010  ? Fibroid 2011  ? Gallbladder problem   ? GERD (gastroesophageal reflux disease)   ? occasional  ? H/O dysmenorrhea   ? H/O varicella   ? H/O: menorrhagia 2011  ? Hypertension   ? Hypokalemia   ? PAST HX  ? Hypothyroid   ? Increased BMI   ? Kidney problem   ? Kidney stones   ? Obesity   ? OSA (obstructive sleep apnea) 04/22/2013  ? Ovarian cyst 2011  ? Perimenopausal symptoms 07/15/2010  ? Pre-diabetes   ? Pregnancy induced hypertension   ? Shortness of breath   ? ONLY WITH ANXIETY  ? Sleep apnea   ? PT USES CPAP SOMETIMES - SETTING IS 3  ? Vitamin D deficiency   ? Weight loss 2010  ? ? ?Past Surgical History:  ?Procedure Laterality Date  ? C-SECTIONS X 2    ? [place]  ? CHOLECYSTECTOMY  1994  ? COLONOSCOPY WITH PROPOFOL N/A 10/26/2018  ? Procedure: COLONOSCOPY WITH PROPOFOL;  Surgeon: Juanita Craver, MD;  Location: [facility];  Service: Endoscopy;  Laterality: N/A;  ? NEPHROLITHOTOMY   04/09/2011  ? Procedure: NEPHROLITHOTOMY PERCUTANEOUS;  Surgeon: Molli Hazard, MD;  Location: [facility];  Service: Urology;  Laterality: Right;   ? ?  ? NEPHROLITHOTOMY  10/15/2011  ? Procedure: NEPHROLITHOTOMY PERCUTANEOUS;  Surgeon: Molli Hazard, MD;  Location: [facility];  Service: Urology;  Laterality: Left;  ? PERCUTANEOUS NEPHROSTOMY AROUND 1996  may 2013  ? right kidney  ? REMOVAL OF STONES  10/15/2011  ? Procedure: REMOVAL OF STONES;  Surgeon: Molli Hazard, MD;  Location: [facility];  Service: Urology;  Laterality: Left;  ? TUBAL LIGATION  1999  ? URETEROSCOPY  04/09/2011  ? Procedure: URETEROSCOPY;  Surgeon: Molli Hazard, MD;  Location: [facility];  Service: Urology;  Laterality: Right;  ? UTERINE ABLATION MARCH 2010  march 2011  ? ? ?Social History: ? reports that she has quit smoking. Her smoking use included cigarettes. She has a 3.75 pack-year smoking history. She has never used smokeless tobacco. She reports current alcohol use. She reports that she does not use drugs. ? ?Allergies: ?Allergies  ?Allergen Reactions  ? Sulfa Antibiotics Hives and Rash  ? ? ?Family History:  ?Family History  ?Problem Relation Age of Onset  ? Diabetes Mother   ? Hypertension Mother   ? Obesity Mother   ?  Hypertension Father   ? Diabetes Father   ? Kidney disease Father   ? Stroke Father   ? Sleep apnea Father   ? Diabetes Paternal Grandmother   ? Diabetes Maternal Grandmother   ? Heart disease Maternal Grandmother   ? ? ? ?Current Outpatient Medications:  ?  Semaglutide, 1 MG/DOSE, 4 MG/3ML SOPN, Inject 1 mg as directed once a week., Disp: 9 mL, Rfl: 0 ?  valsartan-hydrochlorothiazide (DIOVAN-HCT) 160-25 MG tablet, Take 1 tablet by mouth daily., Disp: 90 tablet, Rfl: 3 ?  allopurinol (ZYLOPRIM) 100 MG tablet, Take 100 mg by mouth daily. , Disp: , Rfl:  ?  amLODipine (NORVASC) 10 MG tablet, TAKE 1 TABLET BY MOUTH EVERY DAY, Disp: 30 tablet, Rfl: 5 ?  atorvastatin (LIPITOR) 80 MG tablet, TAKE 1 TABLET BY MOUTH  EVERY DAY, Disp: 90 tablet, Rfl: 0 ?  blood glucose meter kit and supplies KIT, Dispense based on patient and insurance preference. Use one time daily as directed. E11.9, Disp: 1 each, Rfl: 0 ?  carvedilol (COREG) 25 MG tablet, Take 1 tablet (25 mg total) by mouth 2 (two) times daily with a meal., Disp: 30 tablet, Rfl: 0 ?  colchicine 0.6 MG tablet, Take 0.6 mg by mouth daily as needed (gout). , Disp: , Rfl:  ?  ezetimibe (ZETIA) 10 MG tablet, TAKE 1 TABLET BY MOUTH EVERY DAY, Disp: 90 tablet, Rfl: 0 ?  levothyroxine (SYNTHROID) 25 MCG tablet, Take 1 tablet (25 mcg total) by mouth daily., Disp: 90 tablet, Rfl: 1 ?  Multiple Vitamins-Minerals (MULTIVITAMIN WITH MINERALS) tablet, Take 1 tablet by mouth daily., Disp: , Rfl:  ?  sertraline (ZOLOFT) 50 MG tablet, TAKE 1 TABLET BY MOUTH EVERY DAY, Disp: 90 tablet, Rfl: 1 ?  traZODone (DESYREL) 50 MG tablet, TAKE 1& 1/2 TABLETS (75 MG TOTAL) BY MOUTH AT BEDTIME AS NEEDED FOR SLEEP., Disp: 135 tablet, Rfl: 1 ?  Vitamin D, Ergocalciferol, (DRISDOL) 1.25 MG (50000 UNIT) CAPS capsule, TAKE 1 CAPSULE BY MOUTH ONE TIME PER WEEK, Disp: 4 capsule, Rfl: 2 ? ?Review of Systems:  ?Constitutional: Denies fever, chills, diaphoresis, appetite change and fatigue.  ?HEENT: Denies photophobia, eye pain, redness, hearing loss, ear pain, congestion, sore throat, rhinorrhea, sneezing, mouth sores, trouble swallowing, neck pain, neck stiffness and tinnitus.   ?Respiratory: Denies SOB, DOE, cough, chest tightness,  and wheezing.   ?Cardiovascular: Denies chest pain, palpitations and leg swelling.  ?Gastrointestinal: Denies nausea, vomiting, abdominal pain, diarrhea, constipation, blood in stool and abdominal distention.  ?Genitourinary: Denies dysuria, urgency, frequency, hematuria, flank pain and difficulty urinating.  ?Endocrine: Denies: hot or cold intolerance, sweats, changes in hair or nails, polyuria, polydipsia. ?Musculoskeletal: Denies myalgias, back pain, joint swelling, arthralgias  and gait problem.  ?Skin: Denies pallor, rash and wound.  ?Neurological: Denies dizziness, seizures, syncope, weakness, light-headedness, numbness and headaches.  ?Hematological: Denies adenopathy. Easy bruising, personal or family bleeding history  ?Psychiatric/Behavioral: Denies suicidal ideation, mood changes, confusion, nervousness, sleep disturbance and agitation ? ? ? ?Physical Exam: ?Vitals:  ? 05/15/21 0838  ?BP: 136/90  ?Pulse: 87  ?Temp: 98.2 ?F (36.8 ?C)  ?TempSrc: Oral  ?Weight: 266 lb 9.6 oz (120.9 kg)  ?Height: 5' 3.5" (1.613 m)  ? ? ?Body mass index is 46.49 kg/m?. ? ? ?Constitutional: NAD, calm, comfortable, obese ?Eyes: PERRL, lids and conjunctivae normal ?ENMT: Mucous membranes are moist. Posterior pharynx clear of any exudate or lesions. Normal dentition. Tympanic membrane is pearly white, no erythema or bulging. ?Neck: normal, supple, no  masses, no thyromegaly ?Respiratory: clear to auscultation bilaterally, no wheezing, no crackles. Normal respiratory effort. No accessory muscle use.  ?Cardiovascular: Regular rate and rhythm, no murmurs / rubs / gallops. No extremity edema. 2+ pedal pulses. No carotid bruits.  ?Abdomen: no tenderness, no masses palpated. No hepatosplenomegaly. Bowel sounds positive.  ?Musculoskeletal: no clubbing / cyanosis. No joint deformity upper and lower extremities. Good ROM, no contractures. Normal muscle tone.  ?Skin: no rashes, lesions, ulcers. No induration ?Neurologic: CN 2-12 grossly intact. Sensation intact, DTR normal. Strength 5/5 in all 4.  ?Psychiatric: Normal judgment and insight. Alert and oriented x 3. Normal mood.  ? ? ?Impression and Plan: ? ?Encounter for preventive health examination ?-Recommend routine eye and dental care. ?-Immunizations: Advised COVID vaccination at pharmacy, otherwise up-to-date ?-Healthy lifestyle discussed in detail. ?-Labs to be updated today. ?-Colon cancer screening: 10/2018 ?-Breast cancer screening: 10/2020 ?-Cervical cancer  screening: 10/2020 ?-Lung cancer screening: Not applicable ?-Prostate cancer screening: Not applicable ?-DEXA: Not applicable ? ?Hyperlipidemia associated with type 2 diabetes mellitus ([place])  ?- Plan: Lip

## 2021-05-16 ENCOUNTER — Encounter: Payer: Self-pay | Admitting: Internal Medicine

## 2021-05-16 ENCOUNTER — Other Ambulatory Visit: Payer: Self-pay | Admitting: Internal Medicine

## 2021-05-16 LAB — HEPATITIS C ANTIBODY
Hepatitis C Ab: NONREACTIVE
SIGNAL TO CUT-OFF: 0.03 (ref <1.00)

## 2021-05-16 MED ORDER — CLONIDINE HCL 0.1 MG PO TABS
0.1000 mg | ORAL_TABLET | Freq: Two times a day (BID) | ORAL | 1 refills | Status: DC
Start: 2021-05-16 — End: 2021-11-18

## 2021-06-04 ENCOUNTER — Encounter: Payer: Self-pay | Admitting: Internal Medicine

## 2021-06-06 NOTE — Telephone Encounter (Signed)
Prior Josem Kaufmann has been started for   Ozempic [id number]

## 2021-06-17 ENCOUNTER — Other Ambulatory Visit: Payer: Self-pay | Admitting: Internal Medicine

## 2021-07-02 ENCOUNTER — Other Ambulatory Visit: Payer: Self-pay | Admitting: Internal Medicine

## 2021-07-02 DIAGNOSIS — E1169 Type 2 diabetes mellitus with other specified complication: Secondary | ICD-10-CM

## 2021-07-02 DIAGNOSIS — E785 Hyperlipidemia, unspecified: Secondary | ICD-10-CM

## 2021-07-02 DIAGNOSIS — F419 Anxiety disorder, unspecified: Secondary | ICD-10-CM

## 2021-07-02 LAB — HM DIABETES EYE EXAM

## 2021-07-03 ENCOUNTER — Encounter: Payer: Self-pay | Admitting: Internal Medicine

## 2021-07-05 ENCOUNTER — Ambulatory Visit (INDEPENDENT_AMBULATORY_CARE_PROVIDER_SITE_OTHER): Payer: 59 | Admitting: Family

## 2021-07-05 ENCOUNTER — Encounter: Payer: Self-pay | Admitting: Family

## 2021-07-05 VITALS — BP 124/88 | HR 76 | Temp 98.2°F | Ht 63.5 in | Wt 270.5 lb

## 2021-07-05 DIAGNOSIS — N1831 Chronic kidney disease, stage 3a: Secondary | ICD-10-CM

## 2021-07-05 DIAGNOSIS — R5383 Other fatigue: Secondary | ICD-10-CM

## 2021-07-05 DIAGNOSIS — T148XXA Other injury of unspecified body region, initial encounter: Secondary | ICD-10-CM

## 2021-07-05 DIAGNOSIS — E559 Vitamin D deficiency, unspecified: Secondary | ICD-10-CM | POA: Diagnosis not present

## 2021-07-05 DIAGNOSIS — E538 Deficiency of other specified B group vitamins: Secondary | ICD-10-CM | POA: Diagnosis not present

## 2021-07-05 DIAGNOSIS — E1122 Type 2 diabetes mellitus with diabetic chronic kidney disease: Secondary | ICD-10-CM

## 2021-07-05 LAB — COMPREHENSIVE METABOLIC PANEL
ALT: 41 U/L — ABNORMAL HIGH (ref 0–35)
AST: 22 U/L (ref 0–37)
Albumin: 4.5 g/dL (ref 3.5–5.2)
Alkaline Phosphatase: 80 U/L (ref 39–117)
BUN: 23 mg/dL (ref 6–23)
CO2: 27 mEq/L (ref 19–32)
Calcium: 10 mg/dL (ref 8.4–10.5)
Chloride: 105 mEq/L (ref 96–112)
Creatinine, Ser: 1.76 mg/dL — ABNORMAL HIGH (ref 0.40–1.20)
GFR: 33.46 mL/min — ABNORMAL LOW (ref >60.00)
Glucose, Bld: 98 mg/dL (ref 70–99)
Potassium: 4.3 mEq/L (ref 3.5–5.1)
Sodium: 139 mEq/L (ref 135–145)
Total Bilirubin: 0.8 mg/dL (ref 0.2–1.2)
Total Protein: 7.4 g/dL (ref 6.0–8.3)

## 2021-07-05 LAB — CBC WITH DIFFERENTIAL/PLATELET
Basophils Absolute: 0 10*3/uL (ref 0.0–0.1)
Basophils Relative: 0.5 % (ref 0.0–3.0)
Eosinophils Absolute: 0.4 10*3/uL (ref 0.0–0.7)
Eosinophils Relative: 5.2 % — ABNORMAL HIGH (ref 0.0–5.0)
HCT: 38.6 % (ref 36.0–46.0)
Hemoglobin: 13.2 g/dL (ref 12.0–15.0)
Lymphocytes Relative: 25.9 % (ref 12.0–46.0)
Lymphs Abs: 1.9 10*3/uL (ref 0.7–4.0)
MCHC: 34.2 g/dL (ref 30.0–36.0)
MCV: 94.4 fl (ref 78.0–100.0)
Monocytes Absolute: 0.6 10*3/uL (ref 0.1–1.0)
Monocytes Relative: 7.9 % (ref 3.0–12.0)
Neutro Abs: 4.6 10*3/uL (ref 1.4–7.7)
Neutrophils Relative %: 60.5 % (ref 43.0–77.0)
Platelets: 262 10*3/uL (ref 150.0–400.0)
RBC: 4.09 Mil/uL (ref 3.87–5.11)
RDW: 14 % (ref 11.5–15.5)
WBC: 7.5 10*3/uL (ref 4.0–10.5)

## 2021-07-05 LAB — VITAMIN D 25 HYDROXY (VIT D DEFICIENCY, FRACTURES): VITD: 52.79 ng/mL (ref 30.00–100.00)

## 2021-07-05 LAB — PROTIME-INR
INR: 0.9 ratio (ref 0.8–1.0)
Prothrombin Time: 10.2 s (ref 9.6–13.1)

## 2021-07-05 LAB — VITAMIN B12: Vitamin B-12: 509 pg/mL (ref 211–911)

## 2021-07-07 NOTE — Progress Notes (Signed)
Acute Office Visit  Subjective:     Patient ID: Robyn [name], female    DOB: 05-21-1970, 51 y.o.   MRN: [id number]  Chief Complaint  Patient presents with   Bleeding/Bruising    Patient complains of sudden onset of bruising noted on the left upper arm and right upper arms x2 weeks, no known injury   Fatigue    X2 weeks    HPI Patient is in today  with c/o increased bruising and fatigue x 3 weeks. She has a history of stage 4 kidney disease.   Review of Systems  Constitutional:  Positive for malaise/fatigue.       Bruising   All other systems reviewed and are negative.       Objective:    BP 124/88 (BP Location: Left Arm, Patient Position: Sitting, Cuff Size: Large)   Pulse 76   Temp 98.2 F (36.8 C) (Oral)   Ht 5' 3.5" (1.613 m)   Wt 270 lb 8 oz (122.7 kg)   SpO2 98%   BMI 47.17 kg/m    Physical Exam Vitals and nursing note reviewed.  Constitutional:      Appearance: Normal appearance.  Eyes:     Extraocular Movements: Extraocular movements intact.     Pupils: Pupils are equal, round, and reactive to light.  Cardiovascular:     Rate and Rhythm: Normal rate and regular rhythm.  Pulmonary:     Effort: Pulmonary effort is normal.     Breath sounds: Normal breath sounds.  Abdominal:     General: Abdomen is flat.     Palpations: Abdomen is soft.  Musculoskeletal:        General: Normal range of motion.     Cervical back: Normal range of motion and neck supple.  Skin:    General: Skin is warm and dry.     Findings: Bruising present.     Comments: Bruising on the upper arms bilaterally   Neurological:     General: No focal deficit present.     Mental Status: She is alert and oriented to person, place, and time.  Psychiatric:        Mood and Affect: Mood normal.        Behavior: Behavior normal.     Results for orders placed or performed in visit on 07/05/21  CBC with Differential/Platelets  Result Value Ref Range   WBC 7.5 4.0 - 10.5 K/uL   RBC  4.09 3.87 - 5.11 Mil/uL   Hemoglobin 13.2 12.0 - 15.0 g/dL   HCT 38.6 36.0 - 46.0 %   MCV 94.4 78.0 - 100.0 fl   MCHC 34.2 30.0 - 36.0 g/dL   RDW 14.0 11.5 - 15.5 %   Platelets 262.0 150.0 - 400.0 K/uL   Neutrophils Relative % 60.5 43.0 - 77.0 %   Lymphocytes Relative 25.9 12.0 - 46.0 %   Monocytes Relative 7.9 3.0 - 12.0 %   Eosinophils Relative 5.2 (H) 0.0 - 5.0 %   Basophils Relative 0.5 0.0 - 3.0 %   Neutro Abs 4.6 1.4 - 7.7 K/uL   Lymphs Abs 1.9 0.7 - 4.0 K/uL   Monocytes Absolute 0.6 0.1 - 1.0 K/uL   Eosinophils Absolute 0.4 0.0 - 0.7 K/uL   Basophils Absolute 0.0 0.0 - 0.1 K/uL  Protime-INR  Result Value Ref Range   INR 0.9 0.8 - 1.0 ratio   Prothrombin Time 10.2 9.6 - 13.1 sec  Vitamin B12  Result Value Ref Range  Vitamin B-12 509 211 - 911 pg/mL  Vitamin D, 25-hydroxy  Result Value Ref Range   VITD 52.79 30.00 - 100.00 ng/mL  CMP  Result Value Ref Range   Sodium 139 135 - 145 mEq/L   Potassium 4.3 3.5 - 5.1 mEq/L   Chloride 105 96 - 112 mEq/L   CO2 27 19 - 32 mEq/L   Glucose, Bld 98 70 - 99 mg/dL   BUN 23 6 - 23 mg/dL   Creatinine, Ser 1.76 (H) 0.40 - 1.20 mg/dL   Total Bilirubin 0.8 0.2 - 1.2 mg/dL   Alkaline Phosphatase 80 39 - 117 U/L   AST 22 0 - 37 U/L   ALT 41 (H) 0 - 35 U/L   Total Protein 7.4 6.0 - 8.3 g/dL   Albumin 4.5 3.5 - 5.2 g/dL   GFR 33.46 (L) >60.00 mL/min   Calcium 10.0 8.4 - 10.5 mg/dL        Assessment & Plan:   Problem List Items Addressed This Visit     Vitamin D deficiency   Relevant Orders   CBC with Differential/Platelets (Completed)   Vitamin D, 25-hydroxy (Completed)   Type 2 diabetes mellitus with stage 3a chronic kidney disease, without long-term current use of insulin ([place])   Relevant Orders   CMP (Completed)   Other Visit Diagnoses     Bruising    -  Primary   Relevant Orders   CBC with Differential/Platelets (Completed)   Protime-INR (Completed)   Vitamin B12 (Completed)   Fatigue, unspecified type        Relevant Orders   CBC with Differential/Platelets (Completed)   Protime-INR (Completed)   Vitamin B12 (Completed)   CMP (Completed)   Stage 3a chronic kidney disease ([place])       Relevant Orders   CMP (Completed)   B12 deficiency       Relevant Orders   CBC with Differential/Platelets (Completed)   Vitamin B12 (Completed)      Call the office if symptoms worsen or persist. Follow-up pending labs    No orders of the defined types were placed in this encounter.   No follow-ups on file.  Kennyth Arnold, FNP

## 2021-07-15 NOTE — Progress Notes (Signed)
PATIENT: Robyn [name] DOB: [birth year]  REASON FOR VISIT: follow up HISTORY FROM: patient PRIMARY NEUROLOGIST:   Virtual Visit via Video Note  I connected with Robyn [name] on 07/16/21 at  2:45 PM EDT by a video enabled telemedicine application located remotely at [facility] and verified that I am speaking with the correct person using two identifiers who was located at their own home.   I discussed the limitations of evaluation and management by telemedicine and the availability of in person appointments. The patient expressed understanding and agreed to proceed.   PATIENT: Robyn [name] DOB: [birth year]  REASON FOR VISIT: follow up HISTORY FROM: patient Primary neurologist: Dr. Vickey Huger  HISTORY OF PRESENT ILLNESS: Today 07/16/21:  Robyn [name] is a 51 year old female with a history of obstructive sleep apnea on CPAP.  She returns today for follow-up.  She reports that the CPAP is working great.  She is having trouble falling asleep. Has tried melatonin and trazodone. Neither one has helped. This morning she did not fall asleep until 2AM. This has been going on for the last 3 months. Stress with work and family. Maybe that is contributing. Usually watches TV in bed, most of the time she falls asleep with the TV on. Only drinks 2 cups of coffee in the morning and the camomile tea throughout the day. Will pull out her tablet/phone if she has been trying to go to sleep but can't     07/18/20: Robyn [name] is a 51 year old female with a history of obstructive sleep apnea on CPAP.  Her download indicates that she use her machine 27 out of 30 days for compliance of 90%.  She use her machine greater than 4 hours 20 days for compliance of 67%.  On average she uses her machine 5 hours and 39 minutes.  Her residual AHI is 1 on 6 to 18 cm water with EPR of 3.  Leak in the 95th percentile is 8.1 L/min.  She reports that when she uses the machine she does find it  beneficial.  She denies any new symptoms.  She returns today for an evaluation.  HISTORY 07/26/19:   Robyn [name] is a 50 year old female with a history of obstructive sleep apnea on CPAP.  Her download indicates that she use her machine 23 out of 30 days for compliance of 77%.  She use her machine greater than 4 hours 16 days for compliance of 53%.  On average she uses her machine 5 hours and 10 minutes.  Her residual AHI is 0.1 on 6 to 18 cm of water with EPR of 3.  Leak in the 95th percentile is 6.2 L/min.  Reports that the trazodone has been working fairly well there are some nights that she still has a hard time falling asleep and may have to take 75 mg.  REVIEW OF SYSTEMS: Out of a complete 14 system review of symptoms, the patient complains only of the following symptoms, and all other reviewed systems are negative.  ALLERGIES: Allergies  Allergen Reactions   Sulfa Antibiotics Hives and Rash    HOME MEDICATIONS: Outpatient Medications Prior to Visit  Medication Sig Dispense Refill   ACCU-CHEK GUIDE test strip USE TO TEST BLOOD SUGAR EVERY DAY AS DIRECTED BY DOCTOR 100 strip 0   allopurinol (ZYLOPRIM) 100 MG tablet Take 100 mg by mouth daily.      amLODipine (NORVASC) 10 MG tablet TAKE 1 TABLET BY MOUTH EVERY DAY 30 tablet 5  atorvastatin (LIPITOR) 80 MG tablet TAKE 1 TABLET BY MOUTH EVERY DAY 90 tablet 1   blood glucose meter kit and supplies KIT Dispense based on patient and insurance preference. Use one time daily as directed. E11.9 1 each 0   carvedilol (COREG) 25 MG tablet Take 1 tablet (25 mg total) by mouth 2 (two) times daily with a meal. 30 tablet 0   cloNIDine (CATAPRES) 0.1 MG tablet Take 1 tablet (0.1 mg total) by mouth 2 (two) times daily. 180 tablet 1   colchicine 0.6 MG tablet Take 0.6 mg by mouth daily as needed (gout).      ezetimibe (ZETIA) 10 MG tablet TAKE 1 TABLET BY MOUTH EVERY DAY 90 tablet 1   levothyroxine (SYNTHROID) 25 MCG tablet Take 1 tablet (25 mcg total)  by mouth daily. 90 tablet 1   Multiple Vitamins-Minerals (MULTIVITAMIN WITH MINERALS) tablet Take 1 tablet by mouth daily.     Semaglutide, 1 MG/DOSE, 4 MG/3ML SOPN Inject 1 mg as directed once a week. 9 mL 0   sertraline (ZOLOFT) 50 MG tablet TAKE 1 TABLET BY MOUTH EVERY DAY 90 tablet 1   traZODone (DESYREL) 50 MG tablet TAKE 1& 1/2 TABLETS (75 MG TOTAL) BY MOUTH AT BEDTIME AS NEEDED FOR SLEEP. 135 tablet 1   Vitamin D, Ergocalciferol, (DRISDOL) 1.25 MG (50000 UNIT) CAPS capsule TAKE 1 CAPSULE BY MOUTH ONE TIME PER WEEK 4 capsule 2   No facility-administered medications prior to visit.    PAST MEDICAL HISTORY: Past Medical History:  Diagnosis Date   Abnormal Pap smear    Allergy    Anxiety    OCCAS PANIC ATTACKS   Back pain    Bilateral swelling of feet    Blood transfusion 1990'S   Blood transfusion without reported diagnosis    BV (bacterial vaginosis)    Cough    nonproductive cough last 2 weeks   Depression 2010   Fibroid 2011   Gallbladder problem    GERD (gastroesophageal reflux disease)    occasional   H/O dysmenorrhea    H/O varicella    H/O: menorrhagia 2011   Hypertension    Hypokalemia    PAST HX   Hypothyroid    Increased BMI    Kidney problem    Kidney stones    Obesity    OSA (obstructive sleep apnea) 04/22/2013   Ovarian cyst 2011   Perimenopausal symptoms 07/15/2010   Pre-diabetes    Pregnancy induced hypertension    Shortness of breath    ONLY WITH ANXIETY   Sleep apnea    PT USES CPAP SOMETIMES - SETTING IS 3   Vitamin D deficiency    Weight loss 2010    PAST SURGICAL HISTORY: Past Surgical History:  Procedure Laterality Date   C-SECTIONS X 2     CESAREAN SECTION  1993 and 1999   CHOLECYSTECTOMY  1994   COLONOSCOPY WITH PROPOFOL N/A 10/26/2018   Procedure: COLONOSCOPY WITH PROPOFOL;  Surgeon: Charna Elizabeth, MD;  Location: [facility];  Service: Endoscopy;  Laterality: N/A;   NEPHROLITHOTOMY  04/09/2011   Procedure: NEPHROLITHOTOMY  PERCUTANEOUS;  Surgeon: Milford Cage, MD;  Location: [facility];  Service: Urology;  Laterality: Right;       NEPHROLITHOTOMY  10/15/2011   Procedure: NEPHROLITHOTOMY PERCUTANEOUS;  Surgeon: Milford Cage, MD;  Location: [facility];  Service: Urology;  Laterality: Left;   PERCUTANEOUS NEPHROSTOMY AROUND 1996  may 2013   right kidney   REMOVAL OF STONES  10/15/2011  Procedure: REMOVAL OF STONES;  Surgeon: Milford Cage, MD;  Location: [facility];  Service: Urology;  Laterality: Left;   TUBAL LIGATION  1999   URETEROSCOPY  04/09/2011   Procedure: URETEROSCOPY;  Surgeon: Milford Cage, MD;  Location: [facility];  Service: Urology;  Laterality: Right;   UTERINE ABLATION MARCH 2010  march 2011    FAMILY HISTORY: Family History  Problem Relation Age of Onset   Diabetes Mother    Hypertension Mother    Obesity Mother    Hypertension Father    Diabetes Father    Kidney disease Father    Stroke Father    Sleep apnea Father    Diabetes Paternal Grandmother    Diabetes Maternal Grandmother    Heart disease Maternal Grandmother     SOCIAL HISTORY: Social History   Socioeconomic History   Marital status: Married    Spouse name: Greggory Stallion   Number of children: 2   Years of education: 15+   Highest education level: Bachelor's degree (e.g., BA, AB, BS)  Occupational History   Occupation: PROGRAM Aeronautical engineer: [employer]  Tobacco Use   Smoking status: Former    Packs/day: 0.25    Years: 15.00    Total pack years: 3.75    Types: Cigarettes   Smokeless tobacco: Never  Substance and Sexual Activity   Alcohol use: Yes    Comment: occasional   Drug use: No   Sexual activity: Yes    Birth control/protection: Surgical    Comment: BTL 1999  Other Topics Concern   Not on file  Social History Narrative   Patient is married Greggory Stallion) and lives at home with her husband and daughter.   Patient has two children.   Patient is working and attending college full-time.    Patient has a college education.   Patient is right-handed.   Patient drinks two cups of coffee daily and one cup of soda and tea daily.   Social Determinants of Health   Financial Resource Strain: Medium Risk (07/03/2021)   Overall Financial Resource Strain (CARDIA)    Difficulty of Paying Living Expenses: Somewhat hard  Food Insecurity: No Food Insecurity (07/03/2021)   Hunger Vital Sign    Worried About Running Out of Food in the Last Year: Never true    Ran Out of Food in the Last Year: Never true  Transportation Needs: No Transportation Needs (07/03/2021)   PRAPARE - Administrator, Civil Service (Medical): No    Lack of Transportation (Non-Medical): No  Physical Activity: Insufficiently Active (07/03/2021)   Exercise Vital Sign    Days of Exercise per Week: 2 days    Minutes of Exercise per Session: 60 min  Stress: Stress Concern Present (07/03/2021)   [employer] of Occupational Health - Occupational Stress Questionnaire    Feeling of Stress : Rather much  Social Connections: Socially Integrated (07/03/2021)   Social Connection and Isolation Panel [NHANES]    Frequency of Communication with Friends and Family: More than three times a week    Frequency of Social Gatherings with Friends and Family: Once a week    Attends Religious Services: More than 4 times per year    Active Member of [employer] or Organizations: Yes    Attends Engineer, structural: More than 4 times per year    Marital Status: Married  Catering manager Violence: Not on file      PHYSICAL EXAM Generalized: Well developed, in  no acute distress   Neurological examination  Mentation: Alert oriented to time, place, history taking. Follows all commands speech and language fluent Cranial nerve II-XII:Extraocular movements were full. Facial symmetry noted. Head turning and shoulder shrug  were normal and symmetric.   DIAGNOSTIC DATA (LABS, IMAGING, TESTING) - I reviewed patient records,  labs, notes, testing and imaging myself where available.  Lab Results  Component Value Date   WBC 7.5 07/05/2021   HGB 13.2 07/05/2021   HCT 38.6 07/05/2021   MCV 94.4 07/05/2021   PLT 262.0 07/05/2021      Component Value Date/Time   NA 139 07/05/2021 1203   NA 139 01/14/2021 0000   K 4.3 07/05/2021 1203   CL 105 07/05/2021 1203   CO2 27 07/05/2021 1203   GLUCOSE 98 07/05/2021 1203   BUN 23 07/05/2021 1203   BUN 17 11/29/2018 0815   CREATININE 1.76 (H) 07/05/2021 1203   CREATININE 1.57 (H) 11/17/2019 0903   CALCIUM 10.0 07/05/2021 1203   PROT 7.4 07/05/2021 1203   PROT 6.6 11/29/2018 0815   ALBUMIN 4.5 07/05/2021 1203   ALBUMIN 4.2 11/29/2018 0815   AST 22 07/05/2021 1203   ALT 41 (H) 07/05/2021 1203   ALKPHOS 80 07/05/2021 1203   BILITOT 0.8 07/05/2021 1203   BILITOT 0.8 11/29/2018 0815   GFRNONAA 40 (L) 05/24/2019 1727   GFRAA 26 01/14/2021 0000   Lab Results  Component Value Date   CHOL 154 05/15/2021   HDL 49.30 05/15/2021   LDLCALC 176 (H) 11/17/2019   LDLDIRECT 43.0 05/15/2021   TRIG 378.0 (H) 05/15/2021   CHOLHDL 3 05/15/2021   Lab Results  Component Value Date   HGBA1C 5.6 05/15/2021   Lab Results  Component Value Date   VITAMINB12 509 07/05/2021   Lab Results  Component Value Date   TSH 2.68 05/15/2021      ASSESSMENT AND PLAN 51 y.o. year old female  has a past medical history of Abnormal Pap smear, Allergy, Anxiety, Back pain, Bilateral swelling of feet, Blood transfusion (1990'S), Blood transfusion without reported diagnosis, BV (bacterial vaginosis), Cough, Depression (2010), Fibroid (2011), Gallbladder problem, GERD (gastroesophageal reflux disease), H/O dysmenorrhea, H/O varicella, H/O: menorrhagia (2011), Hypertension, Hypokalemia, Hypothyroid, Increased BMI, Kidney problem, Kidney stones, Obesity, OSA (obstructive sleep apnea) (04/22/2013), Ovarian cyst (2011), Perimenopausal symptoms (07/15/2010), Pre-diabetes, Pregnancy induced  hypertension, Shortness of breath, Sleep apnea, Vitamin D deficiency, and Weight loss (2010). here with:  OSA on CPAP  CPAP compliance has improved Residual AHI is good Encouraged patient to continue using CPAP nightly and > 4 hours each night   Insomnia   Discussed sleep hygiene.  Encouraged the patient to eliminate screen time including the TV, tablet and phone at least 1 hour before her desired bedtime. She will restart trazodone 75 mg at bedtime Currently on Zoloft but only taking it as needed.  Advised that Zoloft taken with trazodone can cause serotonin syndrome although this is rare and unlikely.  Patient states that she will avoid taking Zoloft while on trazodone.  She will follow-up in 3 months for evaluation   Robyn Penny, MSN, NP-C 07/16/2021, 2:41 PM [facility] [address] [place], [place] [address] [phone]

## 2021-07-16 ENCOUNTER — Telehealth (INDEPENDENT_AMBULATORY_CARE_PROVIDER_SITE_OTHER): Payer: 59 | Admitting: Adult Health

## 2021-07-16 ENCOUNTER — Other Ambulatory Visit: Payer: Self-pay | Admitting: Internal Medicine

## 2021-07-16 DIAGNOSIS — G4709 Other insomnia: Secondary | ICD-10-CM

## 2021-07-16 DIAGNOSIS — G4733 Obstructive sleep apnea (adult) (pediatric): Secondary | ICD-10-CM

## 2021-07-16 DIAGNOSIS — Z9989 Dependence on other enabling machines and devices: Secondary | ICD-10-CM

## 2021-07-16 MED ORDER — TRAZODONE HCL 50 MG PO TABS: ORAL_TABLET | ORAL | 1 refills | Status: DC

## 2021-07-25 ENCOUNTER — Other Ambulatory Visit: Payer: Self-pay | Admitting: Internal Medicine

## 2021-07-25 DIAGNOSIS — N1831 Chronic kidney disease, stage 3a: Secondary | ICD-10-CM

## 2021-07-25 DIAGNOSIS — E1122 Type 2 diabetes mellitus with diabetic chronic kidney disease: Secondary | ICD-10-CM

## 2021-08-14 ENCOUNTER — Encounter (INDEPENDENT_AMBULATORY_CARE_PROVIDER_SITE_OTHER): Payer: Self-pay

## 2021-08-22 ENCOUNTER — Ambulatory Visit: Payer: 59 | Admitting: Internal Medicine

## 2021-08-26 ENCOUNTER — Encounter: Payer: Self-pay | Admitting: Internal Medicine

## 2021-08-29 ENCOUNTER — Ambulatory Visit: Payer: 59 | Admitting: Internal Medicine

## 2021-09-02 ENCOUNTER — Ambulatory Visit (INDEPENDENT_AMBULATORY_CARE_PROVIDER_SITE_OTHER): Payer: 59 | Admitting: Internal Medicine

## 2021-09-02 ENCOUNTER — Encounter: Payer: Self-pay | Admitting: Internal Medicine

## 2021-09-02 VITALS — BP 124/88 | HR 80 | Temp 98.4°F | Wt 272.9 lb

## 2021-09-02 DIAGNOSIS — E038 Other specified hypothyroidism: Secondary | ICD-10-CM

## 2021-09-02 DIAGNOSIS — E1122 Type 2 diabetes mellitus with diabetic chronic kidney disease: Secondary | ICD-10-CM | POA: Diagnosis not present

## 2021-09-02 DIAGNOSIS — N1831 Chronic kidney disease, stage 3a: Secondary | ICD-10-CM

## 2021-09-02 DIAGNOSIS — N184 Chronic kidney disease, stage 4 (severe): Secondary | ICD-10-CM

## 2021-09-02 DIAGNOSIS — I10 Essential (primary) hypertension: Secondary | ICD-10-CM

## 2021-09-02 DIAGNOSIS — E1169 Type 2 diabetes mellitus with other specified complication: Secondary | ICD-10-CM | POA: Diagnosis not present

## 2021-09-02 DIAGNOSIS — E785 Hyperlipidemia, unspecified: Secondary | ICD-10-CM

## 2021-09-02 LAB — POCT GLYCOSYLATED HEMOGLOBIN (HGB A1C): Hemoglobin A1C: 5.5 % (ref 4.0–5.6)

## 2021-09-02 NOTE — Progress Notes (Signed)
Established Patient Office Visit     CC/Reason for Visit: 41-month follow-up chronic medical conditions  HPI: Robyn [name] is a 51 y.o. female who is coming in today for the above mentioned reasons. Past Medical History is significant for: Morbid obesity, hypertension, hyperlipidemia, type 2 diabetes, stage III chronic kidney disease, obstructive sleep apnea on CPAP, hypothyroidism, gout and depression.  She has been doing well.  She remains concerned about her weight.  She is willing to consider weight loss surgery.  She has scheduled to follow-up with her GYN.  She had her diabetic eye exam in June.  Past Medical/Surgical History: Past Medical History:  Diagnosis Date   Abnormal Pap smear    Allergy    Anxiety    OCCAS PANIC ATTACKS   Back pain    Bilateral swelling of feet    Blood transfusion 1990'S   Blood transfusion without reported diagnosis    BV (bacterial vaginosis)    Cough    nonproductive cough last 2 weeks   Depression 2010   Fibroid 2011   Gallbladder problem    GERD (gastroesophageal reflux disease)    occasional   H/O dysmenorrhea    H/O varicella    H/O: menorrhagia 2011   Hypertension    Hypokalemia    PAST HX   Hypothyroid    Increased BMI    Kidney problem    Kidney stones    Obesity    OSA (obstructive sleep apnea) 04/22/2013   Ovarian cyst 2011   Perimenopausal symptoms 07/15/2010   Pre-diabetes    Pregnancy induced hypertension    Shortness of breath    ONLY WITH ANXIETY   Sleep apnea    PT USES CPAP SOMETIMES - SETTING IS 3   Vitamin D deficiency    Weight loss 2010    Past Surgical History:  Procedure Laterality Date   C-SECTIONS X 2     CESAREAN SECTION  1993 and [place]   COLONOSCOPY WITH PROPOFOL N/A 10/26/2018   Procedure: COLONOSCOPY WITH PROPOFOL;  Surgeon: Juanita Craver, MD;  [facility];  Service: Endoscopy;  Laterality: N/A;   NEPHROLITHOTOMY  04/09/2011   Procedure: NEPHROLITHOTOMY  PERCUTANEOUS;  Surgeon: Molli Hazard, MD;  [facility];  Service: Urology;  Laterality: Right;       NEPHROLITHOTOMY  10/15/2011   Procedure: NEPHROLITHOTOMY PERCUTANEOUS;  Surgeon: Molli Hazard, MD;  [facility];  Service: Urology;  Laterality: Left;   PERCUTANEOUS NEPHROSTOMY AROUND 1996  may 2013   right kidney   REMOVAL OF STONES  10/15/2011   Procedure: REMOVAL OF STONES;  Surgeon: Molli Hazard, MD;  [facility];  Service: Urology;  Laterality: Left;   TUBAL LIGATION  1999   URETEROSCOPY  04/09/2011   Procedure: URETEROSCOPY;  Surgeon: Molli Hazard, MD;  [facility];  Service: Urology;  Laterality: Right;   UTERINE ABLATION MARCH 2010  march 2011    Social History:  reports that she has quit smoking. Her smoking use included cigarettes. She has a 3.75 pack-year smoking history. She has never used smokeless tobacco. She reports current alcohol use. She reports that she does not use drugs.  Allergies: Allergies  Allergen Reactions   Sulfa Antibiotics Hives and Rash    Family History:  Family History  Problem Relation Age of Onset   Diabetes Mother    Hypertension Mother    Obesity Mother    Hypertension Father  Diabetes Father    Kidney disease Father    Stroke Father    Sleep apnea Father    Diabetes Paternal Grandmother    Diabetes Maternal Grandmother    Heart disease Maternal Grandmother      Current Outpatient Medications:    ACCU-CHEK GUIDE test strip, USE TO TEST BLOOD SUGAR EVERY DAY AS DIRECTED BY DOCTOR, Disp: 100 strip, Rfl: 0   allopurinol (ZYLOPRIM) 100 MG tablet, Take 100 mg by mouth daily. , Disp: , Rfl:    amLODipine (NORVASC) 10 MG tablet, TAKE 1 TABLET BY MOUTH EVERY DAY, Disp: 30 tablet, Rfl: 5   atorvastatin (LIPITOR) 80 MG tablet, TAKE 1 TABLET BY MOUTH EVERY DAY, Disp: 90 tablet, Rfl: 1   blood glucose meter kit and supplies KIT, Dispense based on patient and insurance preference. Use one time  daily as directed. E11.9, Disp: 1 each, Rfl: 0   carvedilol (COREG) 25 MG tablet, Take 1 tablet (25 mg total) by mouth 2 (two) times daily with a meal., Disp: 30 tablet, Rfl: 0   cloNIDine (CATAPRES) 0.1 MG tablet, Take 1 tablet (0.1 mg total) by mouth 2 (two) times daily., Disp: 180 tablet, Rfl: 1   colchicine 0.6 MG tablet, Take 0.6 mg by mouth daily as needed (gout). , Disp: , Rfl:    ezetimibe (ZETIA) 10 MG tablet, TAKE 1 TABLET BY MOUTH EVERY DAY, Disp: 90 tablet, Rfl: 1   levothyroxine (SYNTHROID) 25 MCG tablet, TAKE 1 TABLET BY MOUTH EVERY DAY, Disp: 90 tablet, Rfl: 1   Multiple Vitamins-Minerals (MULTIVITAMIN WITH MINERALS) tablet, Take 1 tablet by mouth daily., Disp: , Rfl:    OZEMPIC, 1 MG/DOSE, 4 MG/3ML SOPN, INJECT 1 MG ONCE A WEEK AS DIRECTED, Disp: 9 mL, Rfl: 2   traZODone (DESYREL) 50 MG tablet, TAKE 1& 1/2 TABLETS (75 MG TOTAL) BY MOUTH AT BEDTIME AS NEEDED FOR SLEEP., Disp: 135 tablet, Rfl: 1   Vitamin D, Ergocalciferol, (DRISDOL) 1.25 MG (50000 UNIT) CAPS capsule, TAKE 1 CAPSULE BY MOUTH ONE TIME PER WEEK, Disp: 4 capsule, Rfl: 2   sertraline (ZOLOFT) 50 MG tablet, TAKE 1 TABLET BY MOUTH EVERY DAY (Patient not taking: Reported on 09/02/2021), Disp: 90 tablet, Rfl: 1  Review of Systems:  Constitutional: Denies fever, chills, diaphoresis, appetite change and fatigue.  HEENT: Denies photophobia, eye pain, redness, hearing loss, ear pain, congestion, sore throat, rhinorrhea, sneezing, mouth sores, trouble swallowing, neck pain, neck stiffness and tinnitus.   Respiratory: Denies SOB, DOE, cough, chest tightness,  and wheezing.   Cardiovascular: Denies chest pain, palpitations and leg swelling.  Gastrointestinal: Denies nausea, vomiting, abdominal pain, diarrhea, constipation, blood in stool and abdominal distention.  Genitourinary: Denies dysuria, urgency, frequency, hematuria, flank pain and difficulty urinating.  Endocrine: Denies: hot or cold intolerance, sweats, changes in hair or  nails, polyuria, polydipsia. Musculoskeletal: Denies myalgias, back pain, joint swelling, arthralgias and gait problem.  Skin: Denies pallor, rash and wound.  Neurological: Denies dizziness, seizures, syncope, weakness, light-headedness, numbness and headaches.  Hematological: Denies adenopathy. Easy bruising, personal or family bleeding history  Psychiatric/Behavioral: Denies suicidal ideation, mood changes, confusion, nervousness, sleep disturbance and agitation    Physical Exam: Vitals:   09/02/21 0913  BP: 124/88  Pulse: 80  Temp: 98.4 F (36.9 C)  TempSrc: Oral  SpO2: 95%  Weight: 272 lb 14.4 oz (123.8 kg)    Body mass index is 47.58 kg/m.   Constitutional: NAD, calm, comfortable, obese Eyes: PERRL, lids and conjunctivae normal ENMT: Mucous membranes are  moist. Respiratory: clear to auscultation bilaterally, no wheezing, no crackles. Normal respiratory effort. No accessory muscle use.  Cardiovascular: Regular rate and rhythm, no murmurs / rubs / gallops. No extremity edema.  Psychiatric: Normal judgment and insight. Alert and oriented x 3. Normal mood.    Impression and Plan:  Type 2 diabetes mellitus with stage 3a chronic kidney disease, without long-term current use of insulin ([place])  - Plan: POCT glycosylated hemoglobin (Hb A1C) -A1c demonstrates excellent control at 5.5, microalbumin and eye exam are up-to-date.  Hyperlipidemia associated with type 2 diabetes mellitus (HCC) -On atorvastatin 80 mg daily and ezetimibe 10 mg daily.  Check fasting lipids next visit.  Other specified hypothyroidism -TSH was 2.680 in May, on levothyroxine 25 mcg daily.  Morbid obesity ([place])  - Plan: Amb Referral to Bariatric Surgery -Discussed healthy lifestyle, including increased physical activity and better food choices to promote weight loss.  Primary hypertension -Well-controlled on amlodipine and clonidine.  CKD (chronic kidney disease) stage 4, GFR 15-29 ml/min  (HCC) -Baseline creatinine is around 1.7 for a GFR of 33.    Time spent:33 minutes reviewing chart, interviewing and examining patient and formulating plan of care.     Lelon Frohlich, MD [facility]

## 2021-10-17 ENCOUNTER — Encounter: Payer: Self-pay | Admitting: *Deleted

## 2021-10-17 NOTE — Progress Notes (Signed)
PATIENT: Robyn [name] DOB: Dec 24, 1970  REASON FOR VISIT: follow up HISTORY FROM: patient PRIMARY NEUROLOGIST:   Virtual Visit via Video Note  I connected with Robyn [name] on 10/18/21 at 11:00 AM EDT by a video enabled telemedicine application located remotely at [facility] and verified that I am speaking with the correct person using two identifiers who was located at their own home.   I discussed the limitations of evaluation and management by telemedicine and the availability of in person appointments. The patient expressed understanding and agreed to proceed.   PATIENT: Robyn [name] DOB: [birth year]  REASON FOR VISIT: follow up HISTORY FROM: patient Primary neurologist: Dr. Brett Fairy  HISTORY OF PRESENT ILLNESS: Today 10/18/21:  Robyn [name] is a 51 year old female with a history of obstructive sleep apnea on CPAP.  She returns today for follow-up.  In regards to his CPAP her download is below.  Reports it is working well for her.  Reports that she is still struggling with insomnia.  Reports that it can take her several hours to actually fall asleep.  She states that she has improved her sleep hygiene based off her last visit.  Takes trazodone 75 mg right before bedtime.   07/16/21:  Robyn [name] is a 51 year old female with a history of obstructive sleep apnea on CPAP.  She returns today for follow-up.  She reports that the CPAP is working great.  She is having trouble falling asleep. Has tried melatonin and trazodone. Neither one has helped. This morning she did not fall asleep until 2AM. This has been going on for the last 3 months. Stress with work and family. Maybe that is contributing. Usually watches TV in bed, most of the time she falls asleep with the TV on. Only drinks 2 cups of coffee in the morning and the camomile tea throughout the day. Will pull out her tablet/phone if she has been trying to go to sleep but can't     07/18/20: Robyn [name]  is a 51 year old female with a history of obstructive sleep apnea on CPAP.  Her download indicates that she use her machine 27 out of 30 days for compliance of 90%.  She use her machine greater than 4 hours 20 days for compliance of 67%.  On average she uses her machine 5 hours and 39 minutes.  Her residual AHI is 1 on 6 to 18 cm water with EPR of 3.  Leak in the 95th percentile is 8.1 L/min.  She reports that when she uses the machine she does find it beneficial.  She denies any new symptoms.  She returns today for an evaluation.  HISTORY 07/26/19:   Robyn [name] is a 51 year old female with a history of obstructive sleep apnea on CPAP.  Her download indicates that she use her machine 23 out of 30 days for compliance of 77%.  She use her machine greater than 4 hours 16 days for compliance of 53%.  On average she uses her machine 5 hours and 10 minutes.  Her residual AHI is 0.1 on 6 to 18 cm of water with EPR of 3.  Leak in the 95th percentile is 6.2 L/min.  Reports that the trazodone has been working fairly well there are some nights that she still has a hard time falling asleep and may have to take 75 mg.  REVIEW OF SYSTEMS: Out of a complete 14 system review of symptoms, the patient complains only of the following symptoms, and all  other reviewed systems are negative.  ALLERGIES: Allergies  Allergen Reactions   Sulfa Antibiotics Hives and Rash    HOME MEDICATIONS: Outpatient Medications Prior to Visit  Medication Sig Dispense Refill   ACCU-CHEK GUIDE test strip USE TO TEST BLOOD SUGAR EVERY DAY AS DIRECTED BY DOCTOR 100 strip 0   allopurinol (ZYLOPRIM) 100 MG tablet Take 100 mg by mouth daily.      amLODipine (NORVASC) 10 MG tablet TAKE 1 TABLET BY MOUTH EVERY DAY 30 tablet 5   atorvastatin (LIPITOR) 80 MG tablet TAKE 1 TABLET BY MOUTH EVERY DAY 90 tablet 1   blood glucose meter kit and supplies KIT Dispense based on patient and insurance preference. Use one time daily as directed. E11.9 1 each  0   carvedilol (COREG) 25 MG tablet Take 1 tablet (25 mg total) by mouth 2 (two) times daily with a meal. 30 tablet 0   cloNIDine (CATAPRES) 0.1 MG tablet Take 1 tablet (0.1 mg total) by mouth 2 (two) times daily. 180 tablet 1   colchicine 0.6 MG tablet Take 0.6 mg by mouth daily as needed (gout).      ezetimibe (ZETIA) 10 MG tablet TAKE 1 TABLET BY MOUTH EVERY DAY 90 tablet 1   levothyroxine (SYNTHROID) 25 MCG tablet TAKE 1 TABLET BY MOUTH EVERY DAY 90 tablet 1   Multiple Vitamins-Minerals (MULTIVITAMIN WITH MINERALS) tablet Take 1 tablet by mouth daily.     OZEMPIC, 1 MG/DOSE, 4 MG/3ML SOPN INJECT 1 MG ONCE A WEEK AS DIRECTED 9 mL 2   sertraline (ZOLOFT) 50 MG tablet TAKE 1 TABLET BY MOUTH EVERY DAY (Patient not taking: Reported on 09/02/2021) 90 tablet 1   traZODone (DESYREL) 50 MG tablet TAKE 1& 1/2 TABLETS (75 MG TOTAL) BY MOUTH AT BEDTIME AS NEEDED FOR SLEEP. 135 tablet 1   Vitamin D, Ergocalciferol, (DRISDOL) 1.25 MG (50000 UNIT) CAPS capsule TAKE 1 CAPSULE BY MOUTH ONE TIME PER WEEK 4 capsule 2   No facility-administered medications prior to visit.    PAST MEDICAL HISTORY: Past Medical History:  Diagnosis Date   Abnormal Pap smear    Allergy    Anxiety    OCCAS PANIC ATTACKS   Back pain    Bilateral swelling of feet    Blood transfusion 1990'S   Blood transfusion without reported diagnosis    BV (bacterial vaginosis)    Cough    nonproductive cough last 2 weeks   Depression 2010   Fibroid 2011   Gallbladder problem    GERD (gastroesophageal reflux disease)    occasional   H/O dysmenorrhea    H/O varicella    H/O: menorrhagia 2011   Hypertension    Hypokalemia    PAST HX   Hypothyroid    Increased BMI    Kidney problem    Kidney stones    Obesity    OSA (obstructive sleep apnea) 04/22/2013   Ovarian cyst 2011   Perimenopausal symptoms 07/15/2010   Pre-diabetes    Pregnancy induced hypertension    Shortness of breath    ONLY WITH ANXIETY   Sleep apnea    PT USES  CPAP SOMETIMES - SETTING IS 3   Vitamin D deficiency    Weight loss 2010    PAST SURGICAL HISTORY: Past Surgical History:  Procedure Laterality Date   C-SECTIONS X 2     CESAREAN SECTION  1993 and [place] WITH PROPOFOL N/A 10/26/2018   Procedure: COLONOSCOPY WITH  PROPOFOL;  Surgeon: Juanita Craver, MD;  Location: [facility];  Service: Endoscopy;  Laterality: N/A;   NEPHROLITHOTOMY  04/09/2011   Procedure: NEPHROLITHOTOMY PERCUTANEOUS;  Surgeon: Molli Hazard, MD;  Location: [facility];  Service: Urology;  Laterality: Right;       NEPHROLITHOTOMY  10/15/2011   Procedure: NEPHROLITHOTOMY PERCUTANEOUS;  Surgeon: Molli Hazard, MD;  Location: [facility];  Service: Urology;  Laterality: Left;   PERCUTANEOUS NEPHROSTOMY AROUND 1996  may 2013   right kidney   REMOVAL OF STONES  10/15/2011   Procedure: REMOVAL OF STONES;  Surgeon: Molli Hazard, MD;  Location: [facility];  Service: Urology;  Laterality: Left;   TUBAL LIGATION  1999   URETEROSCOPY  04/09/2011   Procedure: URETEROSCOPY;  Surgeon: Molli Hazard, MD;  Location: [facility];  Service: Urology;  Laterality: Right;   UTERINE ABLATION MARCH 2010  march 2011    FAMILY HISTORY: Family History  Problem Relation Age of Onset   Diabetes Mother    Hypertension Mother    Obesity Mother    Hypertension Father    Diabetes Father    Kidney disease Father    Stroke Father    Sleep apnea Father    Diabetes Paternal Grandmother    Diabetes Maternal Grandmother    Heart disease Maternal Grandmother     SOCIAL HISTORY: Social History   Socioeconomic History   Marital status: Married    Spouse name: Iona Beard   Number of children: 2   Years of education: 15+   Highest education level: Bachelor's degree (e.g., BA, AB, BS)  Occupational History   Occupation: PROGRAM Theme park manager: [employer]  Tobacco Use   Smoking status: Former    Packs/day: 0.25    Years: 15.00    Total  pack years: 3.75    Types: Cigarettes   Smokeless tobacco: Never  Substance and Sexual Activity   Alcohol use: Yes    Comment: occasional   Drug use: No   Sexual activity: Yes    Birth control/protection: Surgical    Comment: BTL 1999  Other Topics Concern   Not on file  Social History Narrative   Patient is married Iona Beard) and lives at home with her husband and daughter.   Patient has two children.   Patient is working and attending college full-time.   Patient has a college education.   Patient is right-handed.   Patient drinks two cups of coffee daily and one cup of soda and tea daily.   Social Determinants of Health   Financial Resource Strain: Medium Risk (07/03/2021)   Overall Financial Resource Strain (CARDIA)    Difficulty of Paying Living Expenses: Somewhat hard  Food Insecurity: No Food Insecurity (07/03/2021)   Hunger Vital Sign    Worried About Running Out of Food in the Last Year: Never true    Ran Out of Food in the Last Year: Never true  Transportation Needs: No Transportation Needs (07/03/2021)   PRAPARE - Hydrologist (Medical): No    Lack of Transportation (Non-Medical): No  Physical Activity: Insufficiently Active (07/03/2021)   Exercise Vital Sign    Days of Exercise per Week: 2 days    Minutes of Exercise per Session: 60 min  Stress: Stress Concern Present (07/03/2021)   Continental    Feeling of Stress : Rather much  Social Connections: [place] (07/03/2021)   Social Connection  and Isolation Panel [NHANES]    Frequency of Communication with Friends and Family: More than three times a week    Frequency of Social Gatherings with Friends and Family: Once a week    Attends Religious Services: More than 4 times per year    Active Member of [employer] or Organizations: Yes    Attends Music therapist: More than 4 times per year    Marital Status:  Married  Human resources officer Violence: Not on file      PHYSICAL EXAM Generalized: Well developed, in no acute distress   Neurological examination  Mentation: Alert oriented to time, place, history taking. Follows all commands speech and language fluent Cranial nerve II-XII:Extraocular movements were full. Facial symmetry noted. Head turning and shoulder shrug  were normal and symmetric.   DIAGNOSTIC DATA (LABS, IMAGING, TESTING) - I reviewed patient records, labs, notes, testing and imaging myself where available.  Lab Results  Component Value Date   WBC 7.5 07/05/2021   HGB 13.2 07/05/2021   HCT 38.6 07/05/2021   MCV 94.4 07/05/2021   PLT 262.0 07/05/2021      Component Value Date/Time   NA 139 07/05/2021 1203   NA 139 01/14/2021 0000   K 4.3 07/05/2021 1203   CL 105 07/05/2021 1203   CO2 27 07/05/2021 1203   GLUCOSE 98 07/05/2021 1203   BUN 23 07/05/2021 1203   BUN 17 11/29/2018 0815   CREATININE 1.76 (H) 07/05/2021 1203   CREATININE 1.57 (H) 11/17/2019 0903   CALCIUM 10.0 07/05/2021 1203   PROT 7.4 07/05/2021 1203   PROT 6.6 11/29/2018 0815   ALBUMIN 4.5 07/05/2021 1203   ALBUMIN 4.2 11/29/2018 0815   AST 22 07/05/2021 1203   ALT 41 (H) 07/05/2021 1203   ALKPHOS 80 07/05/2021 1203   BILITOT 0.8 07/05/2021 1203   BILITOT 0.8 11/29/2018 0815   GFRNONAA 40 (L) 05/24/2019 1727   GFRAA 26 01/14/2021 0000   Lab Results  Component Value Date   CHOL 154 05/15/2021   HDL 49.30 05/15/2021   LDLCALC 176 (H) 11/17/2019   LDLDIRECT 43.0 05/15/2021   TRIG 378.0 (H) 05/15/2021   CHOLHDL 3 05/15/2021   Lab Results  Component Value Date   HGBA1C 5.5 09/02/2021   Lab Results  Component Value Date   VITAMINB12 509 07/05/2021   Lab Results  Component Value Date   TSH 2.68 05/15/2021      ASSESSMENT AND PLAN 51 y.o. year old female  has a past medical history of Abnormal Pap smear, Allergy, Anxiety, Back pain, Bilateral swelling of feet, Blood transfusion  (1990'S), Blood transfusion without reported diagnosis, BV (bacterial vaginosis), Cough, Depression (2010), Fibroid (2011), Gallbladder problem, GERD (gastroesophageal reflux disease), H/O dysmenorrhea, H/O varicella, H/O: menorrhagia (2011), Hypertension, Hypokalemia, Hypothyroid, Increased BMI, Kidney problem, Kidney stones, Obesity, OSA (obstructive sleep apnea) (04/22/2013), Ovarian cyst (2011), Perimenopausal symptoms (07/15/2010), Pre-diabetes, Pregnancy induced hypertension, Shortness of breath, Sleep apnea, Vitamin D deficiency, and Weight loss (2010). here with:  OSA on CPAP  CPAP compliance has improved Residual AHI is good Encouraged patient to continue using CPAP nightly and > 4 hours each night   Insomnia    Increase trazodone 100 mg at bedtime  She will follow-up in 3-70months for evaluation   Ward Givens, MSN, NP-C 10/18/2021, 8:28 AM [facility] [address] [place], [place] [address] [phone]

## 2021-10-18 ENCOUNTER — Telehealth (INDEPENDENT_AMBULATORY_CARE_PROVIDER_SITE_OTHER): Payer: 59 | Admitting: Adult Health

## 2021-10-18 DIAGNOSIS — G4733 Obstructive sleep apnea (adult) (pediatric): Secondary | ICD-10-CM | POA: Diagnosis not present

## 2021-10-18 DIAGNOSIS — G4709 Other insomnia: Secondary | ICD-10-CM

## 2021-10-18 MED ORDER — TRAZODONE HCL 50 MG PO TABS: 100.0000 mg | ORAL_TABLET | Freq: Every day | ORAL | 1 refills | Status: DC

## 2021-10-31 LAB — BASIC METABOLIC PANEL
BUN: 23 — AB (ref 4–21)
CO2: 28 — AB (ref 13–22)
Chloride: 102 (ref 99–108)
Creatinine: 2.9 — AB (ref 0.5–1.1)
Glucose: 157
Potassium: 4.1 mEq/L (ref 3.5–5.1)
Sodium: 140 (ref 137–147)

## 2021-10-31 LAB — CBC AND DIFFERENTIAL
HCT: 38 (ref 36–46)
Hemoglobin: 13 (ref 12.0–16.0)
Platelets: 279 10*3/uL (ref 150–400)
WBC: 9

## 2021-10-31 LAB — VITAMIN D 25 HYDROXY (VIT D DEFICIENCY, FRACTURES): Vit D, 25-Hydroxy: 42.1

## 2021-10-31 LAB — COMPREHENSIVE METABOLIC PANEL
Albumin: 4.6 (ref 3.5–5.0)
Calcium: 9.6 (ref 8.7–10.7)

## 2021-11-13 ENCOUNTER — Encounter: Payer: Self-pay | Admitting: Internal Medicine

## 2021-11-17 ENCOUNTER — Other Ambulatory Visit: Payer: Self-pay | Admitting: Internal Medicine

## 2021-11-17 DIAGNOSIS — I10 Essential (primary) hypertension: Secondary | ICD-10-CM

## 2021-12-03 ENCOUNTER — Other Ambulatory Visit: Payer: Self-pay | Admitting: Internal Medicine

## 2021-12-03 ENCOUNTER — Encounter: Payer: Self-pay | Admitting: Internal Medicine

## 2021-12-03 ENCOUNTER — Encounter: Payer: Self-pay | Admitting: *Deleted

## 2021-12-03 ENCOUNTER — Ambulatory Visit (INDEPENDENT_AMBULATORY_CARE_PROVIDER_SITE_OTHER): Payer: 59 | Admitting: Internal Medicine

## 2021-12-03 VITALS — BP 120/84 | HR 64 | Temp 98.2°F | Wt 280.6 lb

## 2021-12-03 DIAGNOSIS — G4733 Obstructive sleep apnea (adult) (pediatric): Secondary | ICD-10-CM

## 2021-12-03 DIAGNOSIS — N1831 Chronic kidney disease, stage 3a: Secondary | ICD-10-CM

## 2021-12-03 DIAGNOSIS — N184 Chronic kidney disease, stage 4 (severe): Secondary | ICD-10-CM

## 2021-12-03 DIAGNOSIS — E785 Hyperlipidemia, unspecified: Secondary | ICD-10-CM

## 2021-12-03 DIAGNOSIS — E1169 Type 2 diabetes mellitus with other specified complication: Secondary | ICD-10-CM

## 2021-12-03 DIAGNOSIS — E1122 Type 2 diabetes mellitus with diabetic chronic kidney disease: Secondary | ICD-10-CM

## 2021-12-03 DIAGNOSIS — E039 Hypothyroidism, unspecified: Secondary | ICD-10-CM

## 2021-12-03 LAB — HEMOGLOBIN A1C: Hgb A1c MFr Bld: 6 % (ref 4.6–6.5)

## 2021-12-03 MED ORDER — SEMAGLUTIDE (2 MG/DOSE) 8 MG/3ML ~~LOC~~ SOPN: 2.0000 mg | PEN_INJECTOR | SUBCUTANEOUS | 2 refills | Status: DC

## 2021-12-03 NOTE — Progress Notes (Signed)
Established Patient Office Visit     CC/Reason for Visit: Follow-up chronic medical conditions  HPI: Robyn [name] is a 51 y.o. female who is coming in today for the above mentioned reasons. Past Medical History is significant for:  Morbid obesity, hypertension, hyperlipidemia, type 2 diabetes, stage III chronic kidney disease, obstructive sleep apnea on CPAP, hypothyroidism, gout and depression.  She has been feeling well.  She is again discouraged by her weight.  She never received a call in regards to bariatric referral that was placed at last visit.  I think she would be an excellent candidate for bariatric surgery.  Past Medical/Surgical History: Past Medical History:  Diagnosis Date   Abnormal Pap smear    Allergy    Anxiety    OCCAS PANIC ATTACKS   Back pain    Bilateral swelling of feet    Blood transfusion 1990'S   Blood transfusion without reported diagnosis    BV (bacterial vaginosis)    Cough    nonproductive cough last 2 weeks   Depression 2010   Fibroid 2011   Gallbladder problem    GERD (gastroesophageal reflux disease)    occasional   H/O dysmenorrhea    H/O varicella    H/O: menorrhagia 2011   Hypertension    Hypokalemia    PAST HX   Hypothyroid    Increased BMI    Kidney problem    Kidney stones    Obesity    OSA (obstructive sleep apnea) 04/22/2013   Ovarian cyst 2011   Perimenopausal symptoms 07/15/2010   Pre-diabetes    Pregnancy induced hypertension    Shortness of breath    ONLY WITH ANXIETY   Sleep apnea    PT USES CPAP SOMETIMES - SETTING IS 3   Vitamin D deficiency    Weight loss 2010    Past Surgical History:  Procedure Laterality Date   C-SECTIONS X 2     CESAREAN SECTION  1993 and [place]   COLONOSCOPY WITH PROPOFOL N/A 10/26/2018   Procedure: COLONOSCOPY WITH PROPOFOL;  Surgeon: Juanita Craver, MD;  Location: [facility];  Service: Endoscopy;  Laterality: N/A;   NEPHROLITHOTOMY  04/09/2011    Procedure: NEPHROLITHOTOMY PERCUTANEOUS;  Surgeon: Molli Hazard, MD;  Location: [facility];  Service: Urology;  Laterality: Right;       NEPHROLITHOTOMY  10/15/2011   Procedure: NEPHROLITHOTOMY PERCUTANEOUS;  Surgeon: Molli Hazard, MD;  Location: [facility];  Service: Urology;  Laterality: Left;   PERCUTANEOUS NEPHROSTOMY AROUND 1996  may 2013   right kidney   REMOVAL OF STONES  10/15/2011   Procedure: REMOVAL OF STONES;  Surgeon: Molli Hazard, MD;  Location: [facility];  Service: Urology;  Laterality: Left;   TUBAL LIGATION  1999   URETEROSCOPY  04/09/2011   Procedure: URETEROSCOPY;  Surgeon: Molli Hazard, MD;  Location: [facility];  Service: Urology;  Laterality: Right;   UTERINE ABLATION MARCH 2010  march 2011    Social History:  reports that she has quit smoking. Her smoking use included cigarettes. She has a 3.75 pack-year smoking history. She has never used smokeless tobacco. She reports current alcohol use. She reports that she does not use drugs.  Allergies: Allergies  Allergen Reactions   Sulfa Antibiotics Hives and Rash    Family History:  Family History  Problem Relation Age of Onset   Diabetes Mother    Hypertension Mother    Obesity Mother  Hypertension Father    Diabetes Father    Kidney disease Father    Stroke Father    Sleep apnea Father    Diabetes Paternal Grandmother    Diabetes Maternal Grandmother    Heart disease Maternal Grandmother      Current Outpatient Medications:    ACCU-CHEK GUIDE test strip, USE TO TEST BLOOD SUGAR EVERY DAY AS DIRECTED BY DOCTOR, Disp: 100 strip, Rfl: 0   allopurinol (ZYLOPRIM) 100 MG tablet, Take 100 mg by mouth daily. , Disp: , Rfl:    amLODipine (NORVASC) 10 MG tablet, TAKE 1 TABLET BY MOUTH EVERY DAY, Disp: 90 tablet, Rfl: 1   atorvastatin (LIPITOR) 80 MG tablet, TAKE 1 TABLET BY MOUTH EVERY DAY, Disp: 90 tablet, Rfl: 1   blood glucose meter kit and supplies KIT, Dispense based on patient and  insurance preference. Use one time daily as directed. E11.9, Disp: 1 each, Rfl: 0   carvedilol (COREG) 25 MG tablet, Take 1 tablet (25 mg total) by mouth 2 (two) times daily with a meal., Disp: 30 tablet, Rfl: 0   cloNIDine (CATAPRES) 0.1 MG tablet, TAKE 1 TABLET BY MOUTH 2 TIMES DAILY., Disp: 180 tablet, Rfl: 1   colchicine 0.6 MG tablet, Take 0.6 mg by mouth daily as needed (gout). , Disp: , Rfl:    ezetimibe (ZETIA) 10 MG tablet, TAKE 1 TABLET BY MOUTH EVERY DAY, Disp: 90 tablet, Rfl: 1   levothyroxine (SYNTHROID) 25 MCG tablet, TAKE 1 TABLET BY MOUTH EVERY DAY, Disp: 90 tablet, Rfl: 1   Multiple Vitamins-Minerals (MULTIVITAMIN WITH MINERALS) tablet, Take 1 tablet by mouth daily., Disp: , Rfl:    Semaglutide, 2 MG/DOSE, 8 MG/3ML SOPN, Inject 2 mg as directed once a week., Disp: 3 mL, Rfl: 2   traZODone (DESYREL) 50 MG tablet, Take 2 tablets (100 mg total) by mouth at bedtime. TAKE 1& 1/2 TABLETS (75 MG TOTAL) BY MOUTH AT BEDTIME AS NEEDED FOR SLEEP., Disp: 180 tablet, Rfl: 1   Vitamin D, Ergocalciferol, (DRISDOL) 1.25 MG (50000 UNIT) CAPS capsule, TAKE 1 CAPSULE BY MOUTH ONE TIME PER WEEK, Disp: 4 capsule, Rfl: 2  Review of Systems:  Constitutional: Denies fever, chills, diaphoresis, appetite change and fatigue.  HEENT: Denies photophobia, eye pain, redness, hearing loss, ear pain, congestion, sore throat, rhinorrhea, sneezing, mouth sores, trouble swallowing, neck pain, neck stiffness and tinnitus.   Respiratory: Denies SOB, DOE, cough, chest tightness,  and wheezing.   Cardiovascular: Denies chest pain, palpitations and leg swelling.  Gastrointestinal: Denies nausea, vomiting, abdominal pain, diarrhea, constipation, blood in stool and abdominal distention.  Genitourinary: Denies dysuria, urgency, frequency, hematuria, flank pain and difficulty urinating.  Endocrine: Denies: hot or cold intolerance, sweats, changes in hair or nails, polyuria, polydipsia. Musculoskeletal: Denies myalgias, back  pain, joint swelling, arthralgias and gait problem.  Skin: Denies pallor, rash and wound.  Neurological: Denies dizziness, seizures, syncope, weakness, light-headedness, numbness and headaches.  Hematological: Denies adenopathy. Easy bruising, personal or family bleeding history  Psychiatric/Behavioral: Denies suicidal ideation, mood changes, confusion, nervousness, sleep disturbance and agitation    Physical Exam: Vitals:   12/03/21 1031  BP: 120/84  Pulse: 64  Temp: 98.2 F (36.8 C)  TempSrc: Oral  SpO2: 98%  Weight: 280 lb 9.6 oz (127.3 kg)    Body mass index is 48.93 kg/m.   Constitutional: NAD, calm, comfortable Eyes: PERRL, lids and conjunctivae normal ENMT: Mucous membranes are moist.  Respiratory: clear to auscultation bilaterally, no wheezing, no crackles. Normal respiratory effort. No  accessory muscle use.  Cardiovascular: Regular rate and rhythm, no murmurs / rubs / gallops. No extremity edema.   Psychiatric: Normal judgment and insight. Alert and oriented x 3. Normal mood.    Impression and Plan:  Type 2 diabetes mellitus with stage 3a chronic kidney disease, without long-term current use of insulin (HCC) - Plan: Hemoglobin A1c, Semaglutide, 2 MG/DOSE, 8 MG/3ML SOPN  Hyperlipidemia associated with type 2 diabetes mellitus (HCC)  OSA on CPAP  Severe obesity (BMI >= 40) (HCC) - Plan: Amb Referral to Bariatric Surgery  Acquired hypothyroidism  CKD (chronic kidney disease) stage 4, GFR 15-29 ml/min (HCC)  -Blood pressure is fairly well-controlled. -She continues to follow with her nephrologist in regards to her CKD. -Discussed healthy lifestyle, including increased physical activity and better food choices to promote weight loss. -I will again place referral to bariatric surgery. -She is requesting Ozempic increased to next dose, will send.  Time spent:32 minutes reviewing chart, interviewing and examining patient and formulating plan of  care.      Lelon Frohlich, MD [facility]

## 2021-12-04 ENCOUNTER — Other Ambulatory Visit: Payer: Self-pay | Admitting: Internal Medicine

## 2021-12-04 ENCOUNTER — Encounter (INDEPENDENT_AMBULATORY_CARE_PROVIDER_SITE_OTHER): Payer: Self-pay

## 2021-12-04 DIAGNOSIS — E1122 Type 2 diabetes mellitus with diabetic chronic kidney disease: Secondary | ICD-10-CM

## 2021-12-04 LAB — HM MAMMOGRAPHY

## 2021-12-09 ENCOUNTER — Other Ambulatory Visit: Payer: Self-pay | Admitting: Internal Medicine

## 2021-12-09 DIAGNOSIS — E1122 Type 2 diabetes mellitus with diabetic chronic kidney disease: Secondary | ICD-10-CM

## 2021-12-10 ENCOUNTER — Other Ambulatory Visit: Payer: Self-pay | Admitting: Internal Medicine

## 2021-12-10 DIAGNOSIS — E1122 Type 2 diabetes mellitus with diabetic chronic kidney disease: Secondary | ICD-10-CM

## 2021-12-10 MED ORDER — SEMAGLUTIDE (1 MG/DOSE) 4 MG/3ML ~~LOC~~ SOPN: 1.0000 mg | PEN_INJECTOR | SUBCUTANEOUS | 2 refills | Status: DC

## 2021-12-10 NOTE — Telephone Encounter (Signed)
Left detailed message on machine for patient. Would she rather switch back to 1 mg or call around to see who has 2 mg of ozempic? Per Dr Jerilee Hoh

## 2021-12-10 NOTE — Telephone Encounter (Signed)
Note sent to [facility]

## 2021-12-10 NOTE — Telephone Encounter (Signed)
Left message on machine for patient to return our call 

## 2021-12-10 NOTE — Telephone Encounter (Signed)
Pt called, returning CMA's call. CMA was unavailable. Pt asked that CMA call back at their earliest convenience. 

## 2021-12-18 ENCOUNTER — Other Ambulatory Visit: Payer: Self-pay | Admitting: Internal Medicine

## 2021-12-18 ENCOUNTER — Encounter: Payer: Self-pay | Admitting: Internal Medicine

## 2021-12-28 ENCOUNTER — Other Ambulatory Visit: Payer: Self-pay | Admitting: Internal Medicine

## 2021-12-28 DIAGNOSIS — E1169 Type 2 diabetes mellitus with other specified complication: Secondary | ICD-10-CM

## 2021-12-31 ENCOUNTER — Encounter: Payer: Self-pay | Admitting: Internal Medicine

## 2021-12-31 ENCOUNTER — Other Ambulatory Visit: Payer: Self-pay | Admitting: Internal Medicine

## 2021-12-31 DIAGNOSIS — F32A Depression, unspecified: Secondary | ICD-10-CM

## 2021-12-31 NOTE — Telephone Encounter (Signed)
Left message on machine for patient to return our call 

## 2022-01-01 ENCOUNTER — Encounter: Payer: Self-pay | Admitting: Family Medicine

## 2022-01-01 ENCOUNTER — Telehealth (INDEPENDENT_AMBULATORY_CARE_PROVIDER_SITE_OTHER): Payer: 59 | Admitting: Family Medicine

## 2022-01-01 VITALS — Ht 63.5 in | Wt 280.6 lb

## 2022-01-01 DIAGNOSIS — U071 COVID-19: Secondary | ICD-10-CM

## 2022-01-01 MED ORDER — MOLNUPIRAVIR EUA 200MG CAPSULE: 4.0000 | ORAL_CAPSULE | Freq: Two times a day (BID) | ORAL | 0 refills | Status: AC

## 2022-01-01 NOTE — Progress Notes (Signed)
Patient ID: Robyn [name], female   DOB: [birth year], 51 y.o.   MRN: [id number]   Virtual Visit via Video Note  I connected with Robyn [name] on 01/01/22 at 10:00 AM EST by a video enabled telemedicine application and verified that I am speaking with the correct person using two identifiers.  Location patient: home Location provider:work or home office Persons participating in the virtual visit: patient, provider  I discussed the limitations of evaluation and management by telemedicine and the availability of in person appointments. The patient expressed understanding and agreed to proceed.   HPI:  Ms. Shampine has COVID-19 infection.  She developed symptoms few days ago of headache and bodyaches followed by congestion and sore throat and cough.  She had some very mild dyspnea when lying supine.  No increased peripheral edema.  She has been dealing with the stress that her father fell and had hip fracture Christmas Eve and then died following surgery.  She is currently with her family in [place] making arrangements for his service this weekend.  She has been taking Mucinex D and NyQuil.  She does have hypertension.  Her other comorbidities include history of type 2 diabetes, chronic kidney disease stage IV, obesity, obstructive sleep apnea.   Past Medical History:  Diagnosis Date   Abnormal Pap smear    Allergy    Anxiety    OCCAS PANIC ATTACKS   Back pain    Bilateral swelling of feet    Blood transfusion 1990'S   Blood transfusion without reported diagnosis    BV (bacterial vaginosis)    Cough    nonproductive cough last 2 weeks   Depression 2010   Fibroid 2011   Gallbladder problem    GERD (gastroesophageal reflux disease)    occasional   H/O dysmenorrhea    H/O varicella    H/O: menorrhagia 2011   Hypertension    Hypokalemia    PAST HX   Hypothyroid    Increased BMI    Kidney problem    Kidney stones    Obesity    OSA (obstructive sleep apnea)  04/22/2013   Ovarian cyst 2011   Perimenopausal symptoms 07/15/2010   Pre-diabetes    Pregnancy induced hypertension    Shortness of breath    ONLY WITH ANXIETY   Sleep apnea    PT USES CPAP SOMETIMES - SETTING IS 3   Vitamin D deficiency    Weight loss 2010   Past Surgical History:  Procedure Laterality Date   C-SECTIONS X 2     CESAREAN SECTION  1993 and [place]   COLONOSCOPY WITH PROPOFOL N/A 10/26/2018   Procedure: COLONOSCOPY WITH PROPOFOL;  Surgeon: Juanita Craver, MD;  Location: [facility];  Service: Endoscopy;  Laterality: N/A;   NEPHROLITHOTOMY  04/09/2011   Procedure: NEPHROLITHOTOMY PERCUTANEOUS;  Surgeon: Molli Hazard, MD;  Location: [facility];  Service: Urology;  Laterality: Right;       NEPHROLITHOTOMY  10/15/2011   Procedure: NEPHROLITHOTOMY PERCUTANEOUS;  Surgeon: Molli Hazard, MD;  Location: [facility];  Service: Urology;  Laterality: Left;   PERCUTANEOUS NEPHROSTOMY AROUND 1996  may 2013   right kidney   REMOVAL OF STONES  10/15/2011   Procedure: REMOVAL OF STONES;  Surgeon: Molli Hazard, MD;  Location: [facility];  Service: Urology;  Laterality: Left;   TUBAL LIGATION  1999   URETEROSCOPY  04/09/2011   Procedure: URETEROSCOPY;  Surgeon: Molli Hazard, MD;  Location: [facility];  Service: Urology;  Laterality: Right;   UTERINE ABLATION MARCH 2010  march 2011    reports that she has quit smoking. Her smoking use included cigarettes. She has a 3.75 pack-year smoking history. She has never used smokeless tobacco. She reports current alcohol use. She reports that she does not use drugs. family history includes Diabetes in her father, maternal grandmother, mother, and paternal grandmother; Heart disease in her maternal grandmother; Hypertension in her father and mother; Kidney disease in her father; Obesity in her mother; Sleep apnea in her father; Stroke in her father. Allergies  Allergen Reactions   Sulfa Antibiotics Hives and Rash       ROS: See pertinent positives and negatives per HPI.  Past Medical History:  Diagnosis Date   Abnormal Pap smear    Allergy    Anxiety    OCCAS PANIC ATTACKS   Back pain    Bilateral swelling of feet    Blood transfusion 1990'S   Blood transfusion without reported diagnosis    BV (bacterial vaginosis)    Cough    nonproductive cough last 2 weeks   Depression 2010   Fibroid 2011   Gallbladder problem    GERD (gastroesophageal reflux disease)    occasional   H/O dysmenorrhea    H/O varicella    H/O: menorrhagia 2011   Hypertension    Hypokalemia    PAST HX   Hypothyroid    Increased BMI    Kidney problem    Kidney stones    Obesity    OSA (obstructive sleep apnea) 04/22/2013   Ovarian cyst 2011   Perimenopausal symptoms 07/15/2010   Pre-diabetes    Pregnancy induced hypertension    Shortness of breath    ONLY WITH ANXIETY   Sleep apnea    PT USES CPAP SOMETIMES - SETTING IS 3   Vitamin D deficiency    Weight loss 2010    Past Surgical History:  Procedure Laterality Date   C-SECTIONS X 2     CESAREAN SECTION  1993 and [place]   COLONOSCOPY WITH PROPOFOL N/A 10/26/2018   Procedure: COLONOSCOPY WITH PROPOFOL;  Surgeon: Juanita Craver, MD;  [facility];  Service: Endoscopy;  Laterality: N/A;   NEPHROLITHOTOMY  04/09/2011   Procedure: NEPHROLITHOTOMY PERCUTANEOUS;  Surgeon: Molli Hazard, MD;  [facility];  Service: Urology;  Laterality: Right;       NEPHROLITHOTOMY  10/15/2011   Procedure: NEPHROLITHOTOMY PERCUTANEOUS;  Surgeon: Molli Hazard, MD;  [facility];  Service: Urology;  Laterality: Left;   PERCUTANEOUS NEPHROSTOMY AROUND 1996  may 2013   right kidney   REMOVAL OF STONES  10/15/2011   Procedure: REMOVAL OF STONES;  Surgeon: Molli Hazard, MD;  [facility];  Service: Urology;  Laterality: Left;   TUBAL LIGATION  1999   URETEROSCOPY  04/09/2011   Procedure: URETEROSCOPY;  Surgeon:  Molli Hazard, MD;  Location[facility];  Service: Urology;  Laterality: Right;   UTERINE ABLATION MARCH 2010  march 2011    Family History  Problem Relation Age of Onset   Diabetes Mother    Hypertension Mother    Obesity Mother    Hypertension Father    Diabetes Father    Kidney disease Father    Stroke Father    Sleep apnea Father    Diabetes Paternal Grandmother    Diabetes Maternal Grandmother    Heart disease Maternal Grandmother  SOCIAL HX: Non-smoker   Current Outpatient Medications:    ACCU-CHEK GUIDE test strip, USE TO TEST BLOOD SUGAR EVERY DAY AS DIRECTED BY DOCTOR, Disp: 100 strip, Rfl: 0   allopurinol (ZYLOPRIM) 100 MG tablet, Take 100 mg by mouth daily. , Disp: , Rfl:    amLODipine (NORVASC) 10 MG tablet, TAKE 1 TABLET BY MOUTH EVERY DAY, Disp: 90 tablet, Rfl: 1   atorvastatin (LIPITOR) 80 MG tablet, TAKE 1 TABLET BY MOUTH EVERY DAY, Disp: 90 tablet, Rfl: 1   blood glucose meter kit and supplies KIT, Dispense based on patient and insurance preference. Use one time daily as directed. E11.9, Disp: 1 each, Rfl: 0   carvedilol (COREG) 25 MG tablet, Take 1 tablet (25 mg total) by mouth 2 (two) times daily with a meal., Disp: 30 tablet, Rfl: 0   cloNIDine (CATAPRES) 0.1 MG tablet, TAKE 1 TABLET BY MOUTH 2 TIMES DAILY., Disp: 180 tablet, Rfl: 1   colchicine 0.6 MG tablet, Take 0.6 mg by mouth daily as needed (gout). , Disp: , Rfl:    ezetimibe (ZETIA) 10 MG tablet, TAKE 1 TABLET BY MOUTH EVERY DAY, Disp: 90 tablet, Rfl: 1   levothyroxine (SYNTHROID) 25 MCG tablet, TAKE 1 TABLET BY MOUTH EVERY DAY, Disp: 90 tablet, Rfl: 1   molnupiravir EUA (LAGEVRIO) 200 mg CAPS capsule, Take 4 capsules (800 mg total) by mouth 2 (two) times daily for 5 days., Disp: 40 capsule, Rfl: 0   Multiple Vitamins-Minerals (MULTIVITAMIN WITH MINERALS) tablet, Take 1 tablet by mouth daily., Disp: , Rfl:    Semaglutide, 1 MG/DOSE, 4 MG/3ML SOPN, Inject 1 mg as directed once a week., Disp: 3 mL,  Rfl: 2   traZODone (DESYREL) 50 MG tablet, Take 2 tablets (100 mg total) by mouth at bedtime. TAKE 1& 1/2 TABLETS (75 MG TOTAL) BY MOUTH AT BEDTIME AS NEEDED FOR SLEEP., Disp: 180 tablet, Rfl: 1   Vitamin D, Ergocalciferol, (DRISDOL) 1.25 MG (50000 UNIT) CAPS capsule, TAKE 1 CAPSULE BY MOUTH ONE TIME PER WEEK, Disp: 4 capsule, Rfl: 2  EXAM:  VITALS per patient if applicable:  GENERAL: alert, oriented, appears well and in no acute distress  HEENT: atraumatic, conjunttiva clear, no obvious abnormalities on inspection of external nose and ears  NECK: normal movements of the head and neck  LUNGS: on inspection no signs of respiratory distress, breathing rate appears normal, no obvious gross SOB, gasping or wheezing  CV: no obvious cyanosis  MS: moves all visible extremities without noticeable abnormality  PSYCH/NEURO: pleasant and cooperative, no obvious depression or anxiety, speech and thought processing grossly intact  ASSESSMENT AND PLAN:  Discussed the following assessment and plan:  COVID-19 infection.  Patient is currently on day 3-4.  Multiple comorbidities as above.  Currently stable.  -We did discuss pros and cons of antiviral therapy.  She is moderately high risk.  She is not a good candidate for Paxlovid because of her chronic kidney disease and multiple other medications.  We did discuss using molnupiravir 4 capsules twice daily for 5 days. -Discussed isolation precautions -Seek further medical care for any increased dyspnea or other concerns     I discussed the assessment and treatment plan with the patient. The patient was provided an opportunity to ask questions and all were answered. The patient agreed with the plan and demonstrated an understanding of the instructions.   The patient was advised to call back or seek an in-person evaluation if the symptoms worsen or if the condition fails to  improve as anticipated.     Carolann Littler, MD

## 2022-01-08 ENCOUNTER — Other Ambulatory Visit: Payer: Self-pay | Admitting: *Deleted

## 2022-01-08 MED ORDER — TRAZODONE HCL 50 MG PO TABS: 100.0000 mg | ORAL_TABLET | Freq: Every day | ORAL | 1 refills | Status: DC

## 2022-01-08 NOTE — Telephone Encounter (Signed)
Received clarification from pharmacy on instructions for Trazodone 50 mg tablet. Rx clarified electronically w/ [facility] on [address]. Take 2 tablets by mouth at bedtime.

## 2022-01-09 ENCOUNTER — Other Ambulatory Visit: Payer: Self-pay | Admitting: Internal Medicine

## 2022-01-15 ENCOUNTER — Other Ambulatory Visit: Payer: Self-pay | Admitting: Internal Medicine

## 2022-01-15 DIAGNOSIS — E1122 Type 2 diabetes mellitus with diabetic chronic kidney disease: Secondary | ICD-10-CM

## 2022-02-02 ENCOUNTER — Encounter: Payer: Self-pay | Admitting: Internal Medicine

## 2022-02-07 ENCOUNTER — Encounter: Payer: Self-pay | Admitting: Internal Medicine

## 2022-02-10 ENCOUNTER — Other Ambulatory Visit: Payer: Self-pay | Admitting: Internal Medicine

## 2022-02-10 DIAGNOSIS — N1831 Chronic kidney disease, stage 3a: Secondary | ICD-10-CM

## 2022-02-10 MED ORDER — TIRZEPATIDE 2.5 MG/0.5ML ~~LOC~~ SOAJ: 2.5000 mg | SUBCUTANEOUS | 2 refills | Status: DC

## 2022-02-25 ENCOUNTER — Encounter (INDEPENDENT_AMBULATORY_CARE_PROVIDER_SITE_OTHER): Payer: Self-pay

## 2022-03-17 ENCOUNTER — Encounter: Payer: Self-pay | Admitting: Internal Medicine

## 2022-04-09 ENCOUNTER — Ambulatory Visit (INDEPENDENT_AMBULATORY_CARE_PROVIDER_SITE_OTHER): Payer: 59 | Admitting: Internal Medicine

## 2022-04-09 ENCOUNTER — Telehealth: Payer: Self-pay | Admitting: *Deleted

## 2022-04-09 ENCOUNTER — Encounter (HOSPITAL_BASED_OUTPATIENT_CLINIC_OR_DEPARTMENT_OTHER): Payer: Self-pay

## 2022-04-09 ENCOUNTER — Encounter: Payer: Self-pay | Admitting: Internal Medicine

## 2022-04-09 ENCOUNTER — Emergency Department (HOSPITAL_BASED_OUTPATIENT_CLINIC_OR_DEPARTMENT_OTHER): Payer: 59

## 2022-04-09 ENCOUNTER — Other Ambulatory Visit: Payer: Self-pay

## 2022-04-09 ENCOUNTER — Inpatient Hospital Stay (HOSPITAL_BASED_OUTPATIENT_CLINIC_OR_DEPARTMENT_OTHER)
Admission: EM | Admit: 2022-04-09 | Discharge: 2022-04-15 | DRG: 682 | Disposition: A | Discharge status: Home / Self Care | Payer: 59 | Attending: Internal Medicine | Admitting: Internal Medicine

## 2022-04-09 VITALS — BP 126/79 | HR 78 | Temp 98.4°F | Wt 282.6 lb

## 2022-04-09 DIAGNOSIS — Z87442 Personal history of urinary calculi: Secondary | ICD-10-CM

## 2022-04-09 DIAGNOSIS — F410 Panic disorder [episodic paroxysmal anxiety] without agoraphobia: Secondary | ICD-10-CM | POA: Diagnosis present

## 2022-04-09 DIAGNOSIS — E1122 Type 2 diabetes mellitus with diabetic chronic kidney disease: Secondary | ICD-10-CM | POA: Diagnosis present

## 2022-04-09 DIAGNOSIS — E039 Hypothyroidism, unspecified: Secondary | ICD-10-CM | POA: Diagnosis present

## 2022-04-09 DIAGNOSIS — E875 Hyperkalemia: Secondary | ICD-10-CM | POA: Diagnosis present

## 2022-04-09 DIAGNOSIS — I10 Essential (primary) hypertension: Secondary | ICD-10-CM

## 2022-04-09 DIAGNOSIS — Z882 Allergy status to sulfonamides status: Secondary | ICD-10-CM

## 2022-04-09 DIAGNOSIS — E785 Hyperlipidemia, unspecified: Secondary | ICD-10-CM | POA: Diagnosis present

## 2022-04-09 DIAGNOSIS — M109 Gout, unspecified: Secondary | ICD-10-CM | POA: Diagnosis present

## 2022-04-09 DIAGNOSIS — Z87891 Personal history of nicotine dependence: Secondary | ICD-10-CM | POA: Diagnosis not present

## 2022-04-09 DIAGNOSIS — J189 Pneumonia, unspecified organism: Secondary | ICD-10-CM | POA: Diagnosis present

## 2022-04-09 DIAGNOSIS — G4733 Obstructive sleep apnea (adult) (pediatric): Secondary | ICD-10-CM | POA: Diagnosis present

## 2022-04-09 DIAGNOSIS — F419 Anxiety disorder, unspecified: Secondary | ICD-10-CM | POA: Diagnosis not present

## 2022-04-09 DIAGNOSIS — Z841 Family history of disorders of kidney and ureter: Secondary | ICD-10-CM | POA: Diagnosis not present

## 2022-04-09 DIAGNOSIS — R252 Cramp and spasm: Secondary | ICD-10-CM

## 2022-04-09 DIAGNOSIS — E876 Hypokalemia: Secondary | ICD-10-CM | POA: Diagnosis not present

## 2022-04-09 DIAGNOSIS — N1831 Chronic kidney disease, stage 3a: Secondary | ICD-10-CM

## 2022-04-09 DIAGNOSIS — N170 Acute kidney failure with tubular necrosis: Secondary | ICD-10-CM | POA: Diagnosis not present

## 2022-04-09 DIAGNOSIS — Z7985 Long-term (current) use of injectable non-insulin antidiabetic drugs: Secondary | ICD-10-CM

## 2022-04-09 DIAGNOSIS — E1169 Type 2 diabetes mellitus with other specified complication: Secondary | ICD-10-CM | POA: Diagnosis present

## 2022-04-09 DIAGNOSIS — N184 Chronic kidney disease, stage 4 (severe): Secondary | ICD-10-CM | POA: Diagnosis not present

## 2022-04-09 DIAGNOSIS — Z6841 Body Mass Index (BMI) 40.0 and over, adult: Secondary | ICD-10-CM

## 2022-04-09 DIAGNOSIS — N179 Acute kidney failure, unspecified: Secondary | ICD-10-CM | POA: Diagnosis present

## 2022-04-09 DIAGNOSIS — E119 Type 2 diabetes mellitus without complications: Secondary | ICD-10-CM

## 2022-04-09 DIAGNOSIS — Z9049 Acquired absence of other specified parts of digestive tract: Secondary | ICD-10-CM

## 2022-04-09 DIAGNOSIS — F32A Depression, unspecified: Secondary | ICD-10-CM | POA: Diagnosis present

## 2022-04-09 DIAGNOSIS — Z833 Family history of diabetes mellitus: Secondary | ICD-10-CM | POA: Diagnosis not present

## 2022-04-09 DIAGNOSIS — Z79890 Hormone replacement therapy (postmenopausal): Secondary | ICD-10-CM

## 2022-04-09 DIAGNOSIS — M62838 Other muscle spasm: Secondary | ICD-10-CM | POA: Diagnosis present

## 2022-04-09 DIAGNOSIS — Z8249 Family history of ischemic heart disease and other diseases of the circulatory system: Secondary | ICD-10-CM | POA: Diagnosis not present

## 2022-04-09 DIAGNOSIS — Z823 Family history of stroke: Secondary | ICD-10-CM | POA: Diagnosis not present

## 2022-04-09 DIAGNOSIS — Z79899 Other long term (current) drug therapy: Secondary | ICD-10-CM

## 2022-04-09 DIAGNOSIS — I129 Hypertensive chronic kidney disease with stage 1 through stage 4 chronic kidney disease, or unspecified chronic kidney disease: Secondary | ICD-10-CM | POA: Diagnosis present

## 2022-04-09 DIAGNOSIS — N390 Urinary tract infection, site not specified: Secondary | ICD-10-CM | POA: Diagnosis present

## 2022-04-09 LAB — COMPREHENSIVE METABOLIC PANEL
ALT: 24 U/L (ref 0–35)
ALT: 24 U/L (ref 0–44)
AST: 16 U/L (ref 0–37)
AST: 15 U/L (ref 15–41)
Albumin: 4.3 g/dL (ref 3.5–5.2)
Albumin: 4.7 g/dL (ref 3.5–5.0)
Alkaline Phosphatase: 85 U/L (ref 39–117)
Alkaline Phosphatase: 80 U/L (ref 38–126)
Anion gap: 11 (ref 5–15)
BUN: 64 mg/dL — ABNORMAL HIGH (ref 6–23)
BUN: 67 mg/dL — ABNORMAL HIGH (ref 6–20)
CO2: 21 mEq/L (ref 19–32)
CO2: 20 mmol/L — ABNORMAL LOW (ref 22–32)
Calcium: 9.4 mg/dL (ref 8.4–10.5)
Calcium: 10.1 mg/dL (ref 8.9–10.3)
Chloride: 106 mEq/L (ref 96–112)
Chloride: 104 mmol/L (ref 98–111)
Creatinine, Ser: 5.19 mg/dL (ref 0.40–1.20)
Creatinine, Ser: 5.02 mg/dL — ABNORMAL HIGH (ref 0.44–1.00)
GFR, Estimated: 10 mL/min — ABNORMAL LOW (ref >60)
GFR: 9.09 mL/min — CL (ref >60.00)
Glucose, Bld: 118 mg/dL — ABNORMAL HIGH (ref 70–99)
Glucose, Bld: 100 mg/dL — ABNORMAL HIGH (ref 70–99)
Potassium: 5.3 mEq/L — ABNORMAL HIGH (ref 3.5–5.1)
Potassium: 5.1 mmol/L (ref 3.5–5.1)
Sodium: 133 mEq/L — ABNORMAL LOW (ref 135–145)
Sodium: 135 mmol/L (ref 135–145)
Total Bilirubin: 0.8 mg/dL (ref 0.2–1.2)
Total Bilirubin: 1.1 mg/dL (ref 0.3–1.2)
Total Protein: 7.4 g/dL (ref 6.0–8.3)
Total Protein: 8.1 g/dL (ref 6.5–8.1)

## 2022-04-09 LAB — TSH: TSH: 3.49 u[IU]/mL (ref 0.35–5.50)

## 2022-04-09 LAB — URINALYSIS, ROUTINE W REFLEX MICROSCOPIC
Bilirubin Urine: NEGATIVE
Glucose, UA: 250 mg/dL — AB
Hgb urine dipstick: NEGATIVE
Ketones, ur: NEGATIVE mg/dL
Leukocytes,Ua: NEGATIVE
Nitrite: NEGATIVE
Protein, ur: >300 mg/dL — AB
Specific Gravity, Urine: 1.011 (ref 1.005–1.030)
pH: 6 (ref 5.0–8.0)

## 2022-04-09 LAB — CBC
HCT: 34.7 % — ABNORMAL LOW (ref 36.0–46.0)
Hemoglobin: 12.4 g/dL (ref 12.0–15.0)
MCH: 32.5 pg (ref 26.0–34.0)
MCHC: 35.7 g/dL (ref 30.0–36.0)
MCV: 90.8 fL (ref 80.0–100.0)
Platelets: 273 10*3/uL (ref 150–400)
RBC: 3.82 MIL/uL — ABNORMAL LOW (ref 3.87–5.11)
RDW: 13 % (ref 11.5–15.5)
WBC: 9.7 10*3/uL (ref 4.0–10.5)
nRBC: 0 % (ref 0.0–0.2)

## 2022-04-09 LAB — GLUCOSE, CAPILLARY: Glucose-Capillary: 173 mg/dL — ABNORMAL HIGH (ref 70–99)

## 2022-04-09 LAB — POCT GLYCOSYLATED HEMOGLOBIN (HGB A1C): Hemoglobin A1C: 5.9 % — AB (ref 4.0–5.6)

## 2022-04-09 LAB — MAGNESIUM: Magnesium: 2 mg/dL (ref 1.5–2.5)

## 2022-04-09 MED ORDER — SODIUM CHLORIDE 0.9 % IV SOLN
1.0000 g | INTRAVENOUS | Status: DC
  Administered 2022-04-09 – 2022-04-14 (×6): 1 g via INTRAVENOUS
  Filled 2022-04-09 (×6): qty 10

## 2022-04-09 MED ORDER — SODIUM CHLORIDE 0.9 % IV SOLN: INTRAVENOUS | Status: DC

## 2022-04-09 NOTE — Progress Notes (Signed)
Received call from [facility]. Patient presents with abnormal labs-AKI, agree with admission. Sees Dr. Posey Pronto at our office for [place] care. Last known renal function was on 10/31/2021 which showed a Cr of 2.09, eGFR 28, K 4.1. Agree with admission for supportive care. Would recommend holding any ACEI/ARBs, SGLT2i, and/or MRAs if on any. UA does reveal a potential of a UTI therefore would recommend treatment for this as this may be a culprit-will defer to primary service for this in regards to abx but can likely start with Rocephin. Renal u/s per ER pending. Please let us know if a consultation is needed if patient fails to improve with supportive care. Please call with any questions/concerns in the interim.  Gean Quint, MD [facility]

## 2022-04-09 NOTE — Telephone Encounter (Signed)
CRITICAL VALUE STICKER  CRITICAL VALUE: Creatinine 5.19, GFR 9.09  RECEIVER (on-site recipient of call): Apolonio Schneiders  DATE & TIME NOTIFIED: 04/09/22 3.25 pm  MESSENGER (representative from lab):Sah  MD NOTIFIED: Jerilee Hoh MD  TIME OF NOTIFICATION:3:25 pm  RESPONSE:  patient needs to go to the ED.  Left message on machine for patient to return our call. Husband is aware per DPR. Patient is aware. Awaiting patient's arrival

## 2022-04-09 NOTE — ED Provider Notes (Signed)
[facility] Provider Note   CSN: [id number] Arrival date & time: 04/09/22  1552     History  Chief Complaint  Patient presents with   Abnormal Lab    Robyn [name] is a 52 y.o. female.   Abnormal Lab Patient presents with worsening kidney function.  Had fatigue and some mild change with urination over the last few weeks.  States went to PCP today for normal visit and was found to have creatinine up to 5.  Potassium also mildly elevated 5.3.  Last creatinine was under 3 but was around 6 months ago.  Sent in for further evaluation.  States she feels as if she could be a little dehydrated.  States she does not drink enough water.    Past Medical History:  Diagnosis Date   Abnormal Pap smear    Allergy    Anxiety    OCCAS PANIC ATTACKS   Back pain    Bilateral swelling of feet    Blood transfusion 1990'S   Blood transfusion without reported diagnosis    BV (bacterial vaginosis)    Cough    nonproductive cough last 2 weeks   Depression 2010   Fibroid 2011   Gallbladder problem    GERD (gastroesophageal reflux disease)    occasional   H/O dysmenorrhea    H/O varicella    H/O: menorrhagia 2011   Hypertension    Hypokalemia    PAST HX   Hypothyroid    Increased BMI    Kidney problem    Kidney stones    Obesity    OSA (obstructive sleep apnea) 04/22/2013   Ovarian cyst 2011   Perimenopausal symptoms 07/15/2010   Pre-diabetes    Pregnancy induced hypertension    Shortness of breath    ONLY WITH ANXIETY   Sleep apnea    PT USES CPAP SOMETIMES - SETTING IS 3   Vitamin D deficiency    Weight loss 2010    Home Medications Prior to Admission medications   Medication Sig Start Date End Date Taking? Authorizing Provider  ACCU-CHEK GUIDE test strip USE TO TEST BLOOD SUGAR EVERY DAY AS DIRECTED BY DOCTOR 06/17/21   Isaac Bliss, Rayford Halsted, MD  allopurinol (ZYLOPRIM) 100 MG tablet Take 100 mg by mouth daily.     [provider]  amLODipine (NORVASC) 10 MG tablet TAKE 1 TABLET BY MOUTH EVERY DAY 11/18/21   Isaac Bliss, Rayford Halsted, MD  atorvastatin (LIPITOR) 80 MG tablet TAKE 1 TABLET BY MOUTH EVERY DAY 12/30/21   Isaac Bliss, Rayford Halsted, MD  blood glucose meter kit and supplies KIT Dispense based on patient and insurance preference. Use one time daily as directed. E11.9 03/18/21   Isaac Bliss, Rayford Halsted, MD  carvedilol (COREG) 25 MG tablet Take 1 tablet (25 mg total) by mouth 2 (two) times daily with a meal. 05/15/15   Elby Beck, FNP  cloNIDine (CATAPRES) 0.1 MG tablet TAKE 1 TABLET BY MOUTH 2 TIMES DAILY. 11/18/21   Isaac Bliss, Rayford Halsted, MD  colchicine 0.6 MG tablet Take 0.6 mg by mouth daily as needed (gout).     [provider]  ezetimibe (ZETIA) 10 MG tablet TAKE 1 TABLET BY MOUTH EVERY DAY 12/30/21   Isaac Bliss, Rayford Halsted, MD  levothyroxine (SYNTHROID) 25 MCG tablet TAKE 1 TABLET BY MOUTH EVERY DAY 01/09/22   Isaac Bliss, Rayford Halsted, MD  Multiple Vitamins-Minerals (MULTIVITAMIN WITH MINERALS) tablet Take 1 tablet by  mouth daily.    [provider]  tirzepatide Darcel Bayley) 2.5 MG/0.5ML Pen Inject 2.5 mg into the skin once a week. 02/10/22   Isaac Bliss, Rayford Halsted, MD  traZODone (DESYREL) 50 MG tablet Take 2 tablets (100 mg total) by mouth at bedtime. 01/08/22   Ward Givens, NP  Vitamin D, Ergocalciferol, (DRISDOL) 1.25 MG (50000 UNIT) CAPS capsule TAKE 1 CAPSULE BY MOUTH ONE TIME PER WEEK 06/26/20   Isaac Bliss, Rayford Halsted, MD      Allergies    Sulfa antibiotics    Review of Systems   Review of Systems  Physical Exam Updated Vital Signs BP (!) 157/98 (BP Location: Right Wrist)   Pulse 90   Temp 98.1 F (36.7 C)   Resp 18   Ht 5\' 4"  (1.626 m)   Wt 128.4 kg   SpO2 (!) 87%   BMI 48.59 kg/m  Physical Exam Vitals reviewed.  Constitutional:      Appearance: She is obese.  Pulmonary:     Breath sounds: No wheezing.  Abdominal:      Tenderness: There is no abdominal tenderness.     Comments: No suprapubic mass.  Musculoskeletal:     Comments: Obese but has mild edema on bilateral lower extremities.  Skin:    General: Skin is warm.     Capillary Refill: Capillary refill takes less than 2 seconds.  Neurological:     Mental Status: She is oriented to person, place, and time.     ED Results / Procedures / Treatments   Labs (all labs ordered are listed, but only abnormal results are displayed) Labs Reviewed  COMPREHENSIVE METABOLIC PANEL - Abnormal; Notable for the following components:      Result Value   CO2 20 (*)    Glucose, Bld 100 (*)    BUN 67 (*)    Creatinine, Ser 5.02 (*)    GFR, Estimated 10 (*)    All other components within normal limits  CBC - Abnormal; Notable for the following components:   RBC 3.82 (*)    HCT 34.7 (*)    All other components within normal limits  URINALYSIS, ROUTINE W REFLEX MICROSCOPIC - Abnormal; Notable for the following components:   APPearance HAZY (*)    Glucose, UA 250 (*)    Protein, ur >300 (*)    Bacteria, UA MANY (*)    All other components within normal limits  GLUCOSE, CAPILLARY - Abnormal; Notable for the following components:   Glucose-Capillary 173 (*)    All other components within normal limits    EKG None  Radiology DG Chest Portable 1 View  Result Date: 04/09/2022 CLINICAL DATA:  Shortness of breath EXAM: PORTABLE CHEST 1 VIEW COMPARISON:  X-ray 07/25/2012 FINDINGS: Is some linear opacity at the right lung base likely scar or atelectasis. Normal cardiopericardial silhouette. No consolidation, pneumothorax or effusion. No edema. Overlapping cardiac leads. Degenerative changes along the spine with slight curvature. IMPRESSION: Mild right basilar atelectasis.  No consolidation. Electronically Signed   By: Jill Side M.D.   On: 04/09/2022 19:47   US Renal  Result Date: 04/09/2022 CLINICAL DATA:  Renal dysfunction EXAM: RENAL / URINARY TRACT ULTRASOUND  COMPLETE COMPARISON:  CT done on 12/29/2011 FINDINGS: Examination was technically difficult due to patient's body habitus. Right Kidney: Renal measurements: 9.9 x 5.2 x 5 cm = volume: 134.3 mL. There is increased cortical echogenicity suggesting medical renal disease. There is 6 mm hyperechoic focus in the midportion of right kidney,  possibly renal stone or calcification in the renal cortex. There is 1.2 cm anechoic structure in right kidney. Focal bulge in the lateral upper pole of right kidney seen in the previous CT could not be distinctly visualized in the submitted images. Left Kidney: Renal measurements: 10.3 x 5.1 x 6.5 cm = volume: 179.1 mL. There is no hydronephrosis. There is increased cortical echogenicity. There is 10 mm echogenic focus in the midportion of left kidney, possibly a calculus or calcification in the renal cortex. Bladder: Appears normal for degree of bladder distention. Other: None. IMPRESSION: There is no hydronephrosis. There is increased cortical echogenicity in both kidneys suggesting medical renal disease. There are small hyperechoic foci in both kidneys suggesting possible renal stones. There is possible 1.2 cm cyst in the right kidney. Electronically Signed   By: Elmer Picker M.D.   On: 04/09/2022 19:24    Procedures Procedures    Medications Ordered in ED Medications  cefTRIAXone (ROCEPHIN) 1 g in sodium chloride 0.9 % 100 mL IVPB (1 g Intravenous New Bag/Given 04/09/22 2136)  0.9 %  sodium chloride infusion (has no administration in time range)    ED Course/ Medical Decision Making/ A&P                             Medical Decision Making Amount and/or Complexity of Data Reviewed Labs: ordered. Radiology: ordered.  Risk Decision regarding hospitalization.   Patient presents with potential acute kidney injury.  Creatinine now up to 5 with recent baseline of 6 months ago of under 3.  Potassium was mildly elevated earlier today but normal now.  I reviewed  PCP note from earlier today.  Also discussed with Dr. Candiss Norse from nephrology.  He recommends admission to the hospital for further workup.  Will get renal ultrasound and chest x-ray here.  Vital signs overall reassuring.  Will discuss with hospitalist for admission.         Final Clinical Impression(s) / ED Diagnoses Final diagnoses:  AKI (acute kidney injury)    Rx / DC Orders ED Discharge Orders     None         Davonna Belling, MD 04/09/22 2321

## 2022-04-09 NOTE — ED Triage Notes (Signed)
Patient here POV from Home.  Endorses Recent Fatigue and Polyuria for a few days. Mild Nausea. No Vomiting/Diarrhea. No Discernable Pain. No Fevers.  Seen by PCP this AM at 1015. Had Blood Specimens collected and was sent for Evaluation for Lower Kidney Function.   NAD Noted during Triage. A&Ox4. GCS 15. Ambulatory.

## 2022-04-09 NOTE — Progress Notes (Signed)
Established Patient Office Visit     CC/Reason for Visit: Follow-up chronic conditions  HPI: Robyn [name] is a 52 y.o. female who is coming in today for the above mentioned reasons. Past Medical History is significant for: Hypertension, hyperlipidemia, morbid obesity, type 2 diabetes, OSA, chronic kidney disease stage III.  She lost her father unexpectedly on Christmas eve.  She has been dealing with anxiety and grief since.  She has had a couple episodes of transient right hand cramping, she has had something similar in the past when she has been hypokalemic.   Past Medical/Surgical History: Past Medical History:  Diagnosis Date   Abnormal Pap smear    Allergy    Anxiety    OCCAS PANIC ATTACKS   Back pain    Bilateral swelling of feet    Blood transfusion 1990'S   Blood transfusion without reported diagnosis    BV (bacterial vaginosis)    Cough    nonproductive cough last 2 weeks   Depression 2010   Fibroid 2011   Gallbladder problem    GERD (gastroesophageal reflux disease)    occasional   H/O dysmenorrhea    H/O varicella    H/O: menorrhagia 2011   Hypertension    Hypokalemia    PAST HX   Hypothyroid    Increased BMI    Kidney problem    Kidney stones    Obesity    OSA (obstructive sleep apnea) 04/22/2013   Ovarian cyst 2011   Perimenopausal symptoms 07/15/2010   Pre-diabetes    Pregnancy induced hypertension    Shortness of breath    ONLY WITH ANXIETY   Sleep apnea    PT USES CPAP SOMETIMES - SETTING IS 3   Vitamin D deficiency    Weight loss 2010    Past Surgical History:  Procedure Laterality Date   C-SECTIONS X 2     CESAREAN SECTION  1993 and [place]   COLONOSCOPY WITH PROPOFOL N/A 10/26/2018   Procedure: COLONOSCOPY WITH PROPOFOL;  Surgeon: Juanita Craver, MD;  Location: [facility];  Service: Endoscopy;  Laterality: N/A;   NEPHROLITHOTOMY  04/09/2011   Procedure: NEPHROLITHOTOMY PERCUTANEOUS;  Surgeon: Molli Hazard, MD;  Location: [facility];  Service: Urology;  Laterality: Right;       NEPHROLITHOTOMY  10/15/2011   Procedure: NEPHROLITHOTOMY PERCUTANEOUS;  Surgeon: Molli Hazard, MD;  Location: [facility];  Service: Urology;  Laterality: Left;   PERCUTANEOUS NEPHROSTOMY AROUND 1996  may 2013   right kidney   REMOVAL OF STONES  10/15/2011   Procedure: REMOVAL OF STONES;  Surgeon: Molli Hazard, MD;  Location: [facility];  Service: Urology;  Laterality: Left;   TUBAL LIGATION  1999   URETEROSCOPY  04/09/2011   Procedure: URETEROSCOPY;  Surgeon: Molli Hazard, MD;  Location: [facility];  Service: Urology;  Laterality: Right;   UTERINE ABLATION MARCH 2010  march 2011    Social History:  reports that she has quit smoking. Her smoking use included cigarettes. She has a 3.75 pack-year smoking history. She has never used smokeless tobacco. She reports current alcohol use. She reports that she does not use drugs.  Allergies: Allergies  Allergen Reactions   Sulfa Antibiotics Hives and Rash    Family History:  Family History  Problem Relation Age of Onset   Diabetes Mother    Hypertension Mother    Obesity Mother    Hypertension Father    Diabetes Father  Kidney disease Father    Stroke Father    Sleep apnea Father    Diabetes Paternal Grandmother    Diabetes Maternal Grandmother    Heart disease Maternal Grandmother      Current Outpatient Medications:    ACCU-CHEK GUIDE test strip, USE TO TEST BLOOD SUGAR EVERY DAY AS DIRECTED BY DOCTOR, Disp: 100 strip, Rfl: 0   allopurinol (ZYLOPRIM) 100 MG tablet, Take 100 mg by mouth daily. , Disp: , Rfl:    amLODipine (NORVASC) 10 MG tablet, TAKE 1 TABLET BY MOUTH EVERY DAY, Disp: 90 tablet, Rfl: 1   atorvastatin (LIPITOR) 80 MG tablet, TAKE 1 TABLET BY MOUTH EVERY DAY, Disp: 90 tablet, Rfl: 1   blood glucose meter kit and supplies KIT, Dispense based on patient and insurance preference. Use one time daily as directed. E11.9, Disp: 1  each, Rfl: 0   carvedilol (COREG) 25 MG tablet, Take 1 tablet (25 mg total) by mouth 2 (two) times daily with a meal., Disp: 30 tablet, Rfl: 0   cloNIDine (CATAPRES) 0.1 MG tablet, TAKE 1 TABLET BY MOUTH 2 TIMES DAILY., Disp: 180 tablet, Rfl: 1   colchicine 0.6 MG tablet, Take 0.6 mg by mouth daily as needed (gout). , Disp: , Rfl:    ezetimibe (ZETIA) 10 MG tablet, TAKE 1 TABLET BY MOUTH EVERY DAY, Disp: 90 tablet, Rfl: 1   levothyroxine (SYNTHROID) 25 MCG tablet, TAKE 1 TABLET BY MOUTH EVERY DAY, Disp: 90 tablet, Rfl: 1   Multiple Vitamins-Minerals (MULTIVITAMIN WITH MINERALS) tablet, Take 1 tablet by mouth daily., Disp: , Rfl:    tirzepatide (MOUNJARO) 2.5 MG/0.5ML Pen, Inject 2.5 mg into the skin once a week., Disp: 2 mL, Rfl: 2   traZODone (DESYREL) 50 MG tablet, Take 2 tablets (100 mg total) by mouth at bedtime., Disp: 180 tablet, Rfl: 1   Vitamin D, Ergocalciferol, (DRISDOL) 1.25 MG (50000 UNIT) CAPS capsule, TAKE 1 CAPSULE BY MOUTH ONE TIME PER WEEK, Disp: 4 capsule, Rfl: 2  Review of Systems:  Negative unless indicated in HPI.   Physical Exam: Vitals:   04/09/22 1034  BP: 126/79  Pulse: 78  Temp: 98.4 F (36.9 C)  TempSrc: Oral  SpO2: 94%  Weight: 282 lb 9.6 oz (128.2 kg)    Body mass index is 49.28 kg/m.   Physical Exam Vitals reviewed.  Constitutional:      Appearance: Normal appearance.  HENT:     Head: Normocephalic and atraumatic.  Eyes:     Conjunctiva/sclera: Conjunctivae normal.     Pupils: Pupils are equal, round, and reactive to light.  Cardiovascular:     Rate and Rhythm: Normal rate and regular rhythm.  Pulmonary:     Effort: Pulmonary effort is normal.     Breath sounds: Normal breath sounds.  Skin:    General: Skin is warm and dry.  Neurological:     General: No focal deficit present.     Mental Status: She is alert and oriented to person, place, and time.  Psychiatric:        Mood and Affect: Mood normal.        Behavior: Behavior normal.         Thought Content: Thought content normal.        Judgment: Judgment normal.      Impression and Plan:  Type 2 diabetes mellitus with stage 3a chronic kidney disease, without long-term current use of insulin - Plan: POC HgB A1c  Cramping of hands - Plan: TSH, TSH  Anxiety disorder, unspecified type  CKD (chronic kidney disease) stage 4, GFR 15-29 ml/min  Primary hypertension  Hypokalemia - Plan: Comprehensive metabolic panel, Magnesium, Magnesium, Comprehensive metabolic panel  -Blood pressure is well-controlled. -A1c of 5.9 demonstrates excellent diabetic management. -Check electrolytes and TSH due to hand cramping.  Time spent:32 minutes reviewing chart, interviewing and examining patient and formulating plan of care.     Lelon Frohlich, MD [facility]

## 2022-04-09 NOTE — Progress Notes (Signed)
This is a 52 year old female with past medical history of anxiety depression, gout, hyperlipidemia, HTN, DM, CKD 4, and hypothyroidism.  She presented to her PCP office after feeling fatigued for several weeks.  Routine blood work revealed a creatinine of 5.  Patient's baseline creatinine 2.9, last on 10/31/2021.  Patient mildly hyperkalemic at 5.3, repeat labs with a potassium of 5.1.  Nephrologist Dr. Candiss Norse contacted.  He recommends admission.  Patient additionally with a UTI.  Treatment initiated

## 2022-04-09 NOTE — Telephone Encounter (Signed)
Patient has arrived.

## 2022-04-09 NOTE — ED Notes (Signed)
Layla Maw, RN at [facility] given report, carelink here for transport via transfer to [place].

## 2022-04-10 ENCOUNTER — Inpatient Hospital Stay (HOSPITAL_COMMUNITY): Payer: 59

## 2022-04-10 DIAGNOSIS — I10 Essential (primary) hypertension: Secondary | ICD-10-CM

## 2022-04-10 DIAGNOSIS — E119 Type 2 diabetes mellitus without complications: Secondary | ICD-10-CM

## 2022-04-10 DIAGNOSIS — E1122 Type 2 diabetes mellitus with diabetic chronic kidney disease: Secondary | ICD-10-CM

## 2022-04-10 DIAGNOSIS — N184 Chronic kidney disease, stage 4 (severe): Secondary | ICD-10-CM

## 2022-04-10 DIAGNOSIS — G4733 Obstructive sleep apnea (adult) (pediatric): Secondary | ICD-10-CM

## 2022-04-10 DIAGNOSIS — N179 Acute kidney failure, unspecified: Secondary | ICD-10-CM | POA: Diagnosis not present

## 2022-04-10 DIAGNOSIS — E785 Hyperlipidemia, unspecified: Secondary | ICD-10-CM

## 2022-04-10 DIAGNOSIS — E1169 Type 2 diabetes mellitus with other specified complication: Secondary | ICD-10-CM

## 2022-04-10 DIAGNOSIS — E039 Hypothyroidism, unspecified: Secondary | ICD-10-CM

## 2022-04-10 LAB — BASIC METABOLIC PANEL
Anion gap: 13 (ref 5–15)
BUN: 61 mg/dL — ABNORMAL HIGH (ref 6–20)
CO2: 18 mmol/L — ABNORMAL LOW (ref 22–32)
Calcium: 9 mg/dL (ref 8.9–10.3)
Chloride: 104 mmol/L (ref 98–111)
Creatinine, Ser: 4.92 mg/dL — ABNORMAL HIGH (ref 0.44–1.00)
GFR, Estimated: 10 mL/min — ABNORMAL LOW (ref >60)
Glucose, Bld: 97 mg/dL (ref 70–99)
Potassium: 4.7 mmol/L (ref 3.5–5.1)
Sodium: 135 mmol/L (ref 135–145)

## 2022-04-10 LAB — GLUCOSE, CAPILLARY
Glucose-Capillary: 121 mg/dL — ABNORMAL HIGH (ref 70–99)
Glucose-Capillary: 151 mg/dL — ABNORMAL HIGH (ref 70–99)
Glucose-Capillary: 113 mg/dL — ABNORMAL HIGH (ref 70–99)
Glucose-Capillary: 118 mg/dL — ABNORMAL HIGH (ref 70–99)

## 2022-04-10 LAB — HIV ANTIBODY (ROUTINE TESTING W REFLEX): HIV Screen 4th Generation wRfx: NONREACTIVE

## 2022-04-10 MED ORDER — TRAZODONE HCL 50 MG PO TABS
100.0000 mg | ORAL_TABLET | Freq: Every day | ORAL | Status: DC
  Administered 2022-04-10 – 2022-04-14 (×6): 100 mg via ORAL
  Filled 2022-04-10 (×6): qty 2

## 2022-04-10 MED ORDER — CARVEDILOL 25 MG PO TABS
25.0000 mg | ORAL_TABLET | Freq: Two times a day (BID) | ORAL | Status: DC
  Administered 2022-04-10 – 2022-04-15 (×11): 25 mg via ORAL
  Filled 2022-04-10 (×11): qty 1

## 2022-04-10 MED ORDER — LEVOTHYROXINE SODIUM 25 MCG PO TABS
25.0000 ug | ORAL_TABLET | Freq: Every day | ORAL | Status: DC
  Administered 2022-04-10 – 2022-04-15 (×6): 25 ug via ORAL
  Filled 2022-04-10 (×6): qty 1

## 2022-04-10 MED ORDER — INSULIN ASPART 100 UNIT/ML IJ SOLN
0.0000 [IU] | Freq: Three times a day (TID) | INTRAMUSCULAR | Status: DC
  Administered 2022-04-10 – 2022-04-11 (×2): 1 [IU] via SUBCUTANEOUS
  Administered 2022-04-13: 4 [IU] via SUBCUTANEOUS
  Administered 2022-04-14: 1 [IU] via SUBCUTANEOUS

## 2022-04-10 MED ORDER — ACETAMINOPHEN 325 MG PO TABS
650.0000 mg | ORAL_TABLET | Freq: Four times a day (QID) | ORAL | Status: DC | PRN
  Administered 2022-04-10 – 2022-04-14 (×3): 650 mg via ORAL
  Filled 2022-04-10 (×4): qty 2

## 2022-04-10 MED ORDER — EZETIMIBE 10 MG PO TABS
10.0000 mg | ORAL_TABLET | Freq: Every day | ORAL | Status: DC
  Administered 2022-04-10 – 2022-04-15 (×6): 10 mg via ORAL
  Filled 2022-04-10 (×6): qty 1

## 2022-04-10 MED ORDER — POLYETHYLENE GLYCOL 3350 17 G PO PACK
17.0000 g | PACK | Freq: Every day | ORAL | Status: DC
  Administered 2022-04-10: 17 g via ORAL
  Filled 2022-04-10 (×3): qty 1

## 2022-04-10 MED ORDER — HEPARIN SODIUM (PORCINE) 5000 UNIT/ML IJ SOLN
5000.0000 [IU] | Freq: Three times a day (TID) | INTRAMUSCULAR | Status: DC
  Administered 2022-04-10 – 2022-04-15 (×15): 5000 [IU] via SUBCUTANEOUS
  Filled 2022-04-10 (×15): qty 1

## 2022-04-10 MED ORDER — LACTATED RINGERS IV SOLN: INTRAVENOUS | Status: AC

## 2022-04-10 MED ORDER — AMLODIPINE BESYLATE 10 MG PO TABS
10.0000 mg | ORAL_TABLET | Freq: Every day | ORAL | Status: DC
  Administered 2022-04-10 – 2022-04-15 (×6): 10 mg via ORAL
  Filled 2022-04-10 (×6): qty 1

## 2022-04-10 MED ORDER — ATORVASTATIN CALCIUM 80 MG PO TABS
80.0000 mg | ORAL_TABLET | Freq: Every day | ORAL | Status: DC
  Administered 2022-04-10 – 2022-04-15 (×6): 80 mg via ORAL
  Filled 2022-04-10 (×6): qty 1

## 2022-04-10 MED ORDER — DOCUSATE SODIUM 100 MG PO CAPS
100.0000 mg | ORAL_CAPSULE | Freq: Two times a day (BID) | ORAL | Status: DC
  Administered 2022-04-10 – 2022-04-14 (×3): 100 mg via ORAL
  Filled 2022-04-10 (×9): qty 1

## 2022-04-10 NOTE — Hospital Course (Signed)
Same day note  Robyn [name] is a 52 y.o. female with medical history significant of hypertension, hyperlipidemia, type 2 diabetes, obstructive sleep apnea, CKD stage IV, morbid obesity presented to hospital with increased urinary frequency lower back pain and worsening creatinine levels.  She had gone to her primary care physician for the symptoms and had worsening renal failure so she was told to come to the ED.  Follows up with [facility] as outpatient.  In the ED, patient was hypertensive with blood pressure of  160/111 on room air. Creatinine was elevated to 5.02 with EGFR of 10 from a prior of 2.9 just 5 months ago.  Potassium was normal at 5.1, sodium of 135.  Urinalysis showed negative nitrite but proteinuria.  Renal ultrasound with no hydronephrosis but possible renal stones.  Nephrology was notified and patient was then considered for admission to the hospital for further evaluation and treatment  Patient seen and examined at bedside.  Patient was admitted to the hospital for abnormal labs.  At the time of my evaluation, patient complains of low back pain.  Physical examination reveals    Laboratory data and imaging was reviewed  Assessment and Plan.  Acute on chronic kidney disease stage IV. - Creatinine elevated to 5.02 with eGFR of 10 from a prior of 2.9 just 5 months ago.  Decreased oral hydration/use of spironolactone could be contributing.  UA positive for proteinuria.  Was empirically started on IV Rocephin.  Has had a history of renal stones but renal ultrasound without any hydronephrosis.  Continue IV fluids.  Nephrology has been consulted.  Will follow recommendations.  Essential hypertension Continue amlodipine and Coreg.  Hold spironolactone and clonidine for now.     Controlled type 2 diabetes mellitus - Last hemoglobin A1c of 5.9.  Will add sliding scale insulin and Accu-Cheks.   Hyperlipidemia associated with type 2 diabetes mellitus Continue Lipitor and  Zetia   Hypothyroidism Continue Synthroid   Severe obesity (BMI >= 40) Body mass index is 48.59 kg/m.  Would benefit from weight loss as outpatient.   OSA on CPAP -Continue CPAP      No Charge  Signed,  Delila Pereyra, MD [facility]

## 2022-04-10 NOTE — Assessment & Plan Note (Signed)
-  Hold Spirolactone and Clonidine for now due to acute renal failure -continue amlodipine and Coreg

## 2022-04-10 NOTE — Assessment & Plan Note (Signed)
-   Last hemoglobin A1c of 5.9.  Monitor without signs give insulin for now.

## 2022-04-10 NOTE — Assessment & Plan Note (Signed)
-   BMI of 48 noted

## 2022-04-10 NOTE — Progress Notes (Signed)
Patient placed on Nasal Mask CPAP at this time. Patient is unaware of home settings. Settings were adjusted per patient comfort. Review documented settings. No respiratory compromise or complications noted.

## 2022-04-10 NOTE — Assessment & Plan Note (Signed)
Continue atorvastatin and Zetia. 

## 2022-04-10 NOTE — Assessment & Plan Note (Signed)
Continue home levothyroxine 

## 2022-04-10 NOTE — H&P (Incomplete)
History and Physical    Patient: Robyn [name] [id number] DOB: 07-24-1970 DOA: 04/09/2022 DOS: the patient was seen and examined on 04/10/2022 PCP: Isaac Bliss, Rayford Halsted, MD  Patient coming from:  [place] [facility]  Chief Complaint:  Chief Complaint  Patient presents with  . Abnormal Lab   HPI: Robyn [name] is a 52 y.o. female with medical history significant of HTN, HLD, T2DM, OSA, CKD stage 4, morbid obesity   Review of Systems: {ROS_Text:26778} Past Medical History:  Diagnosis Date  . Abnormal Pap smear   . Allergy   . Anxiety    OCCAS PANIC ATTACKS  . Back pain   . Bilateral swelling of feet   . Blood transfusion 1990'S  . Blood transfusion without reported diagnosis   . BV (bacterial vaginosis)   . Cough    nonproductive cough last 2 weeks  . Depression 2010  . Fibroid 2011  . Gallbladder problem   . GERD (gastroesophageal reflux disease)    occasional  . H/O dysmenorrhea   . H/O varicella   . H/O: menorrhagia 2011  . Hypertension   . Hypokalemia    PAST HX  . Hypothyroid   . Increased BMI   . Kidney problem   . Kidney stones   . Obesity   . OSA (obstructive sleep apnea) 04/22/2013  . Ovarian cyst 2011  . Perimenopausal symptoms 07/15/2010  . Pre-diabetes   . Pregnancy induced hypertension   . Shortness of breath    ONLY WITH ANXIETY  . Sleep apnea    PT USES CPAP SOMETIMES - SETTING IS 3  . Vitamin D deficiency   . Weight loss 2010   Past Surgical History:  Procedure Laterality Date  . C-SECTIONS X 2    . [place]  . CHOLECYSTECTOMY  1994  . COLONOSCOPY WITH PROPOFOL N/A 10/26/2018   Procedure: COLONOSCOPY WITH PROPOFOL;  Surgeon: Juanita Craver, MD;  Location: [facility];  Service: Endoscopy;  Laterality: N/A;  . NEPHROLITHOTOMY  04/09/2011   Procedure: NEPHROLITHOTOMY PERCUTANEOUS;  Surgeon: Molli Hazard, MD;  Location: [facility];  Service: Urology;  Laterality: Right;      . NEPHROLITHOTOMY  10/15/2011    Procedure: NEPHROLITHOTOMY PERCUTANEOUS;  Surgeon: Molli Hazard, MD;  Location: [facility];  Service: Urology;  Laterality: Left;  . PERCUTANEOUS NEPHROSTOMY AROUND 1996  may 2013   right kidney  . REMOVAL OF STONES  10/15/2011   Procedure: REMOVAL OF STONES;  Surgeon: Molli Hazard, MD;  Location: [facility];  Service: Urology;  Laterality: Left;  . TUBAL LIGATION  1999  . URETEROSCOPY  04/09/2011   Procedure: URETEROSCOPY;  Surgeon: Molli Hazard, MD;  Location: [facility];  Service: Urology;  Laterality: Right;  . UTERINE ABLATION MARCH 2010  march 2011   Social History:  reports that she has quit smoking. Her smoking use included cigarettes. She has a 3.75 pack-year smoking history. She has never used smokeless tobacco. She reports current alcohol use. She reports that she does not use drugs.  Allergies  Allergen Reactions  . Sulfa Antibiotics Hives and Rash    Family History  Problem Relation Age of Onset  . Diabetes Mother   . Hypertension Mother   . Obesity Mother   . Hypertension Father   . Diabetes Father   . Kidney disease Father   . Stroke Father   . Sleep apnea Father   . Diabetes Paternal Grandmother   . Diabetes Maternal Grandmother   .  Heart disease Maternal Grandmother     Prior to Admission medications   Medication Sig Start Date End Date Taking? Authorizing Provider  ACCU-CHEK GUIDE test strip USE TO TEST BLOOD SUGAR EVERY DAY AS DIRECTED BY DOCTOR 06/17/21   Isaac Bliss, Rayford Halsted, MD  allopurinol (ZYLOPRIM) 100 MG tablet Take 100 mg by mouth daily.     [provider]  amLODipine (NORVASC) 10 MG tablet TAKE 1 TABLET BY MOUTH EVERY DAY 11/18/21   Isaac Bliss, Rayford Halsted, MD  atorvastatin (LIPITOR) 80 MG tablet TAKE 1 TABLET BY MOUTH EVERY DAY 12/30/21   Isaac Bliss, Rayford Halsted, MD  blood glucose meter kit and supplies KIT Dispense based on patient and insurance preference. Use one time daily as directed. E11.9 03/18/21   Isaac Bliss, Rayford Halsted, MD  carvedilol (COREG) 25 MG tablet Take 1 tablet (25 mg total) by mouth 2 (two) times daily with a meal. 05/15/15   Elby Beck, FNP  cloNIDine (CATAPRES) 0.1 MG tablet TAKE 1 TABLET BY MOUTH 2 TIMES DAILY. 11/18/21   Isaac Bliss, Rayford Halsted, MD  colchicine 0.6 MG tablet Take 0.6 mg by mouth daily as needed (gout).     [provider]  ezetimibe (ZETIA) 10 MG tablet TAKE 1 TABLET BY MOUTH EVERY DAY 12/30/21   Isaac Bliss, Rayford Halsted, MD  levothyroxine (SYNTHROID) 25 MCG tablet TAKE 1 TABLET BY MOUTH EVERY DAY 01/09/22   Isaac Bliss, Rayford Halsted, MD  Multiple Vitamins-Minerals (MULTIVITAMIN WITH MINERALS) tablet Take 1 tablet by mouth daily.    [provider]  tirzepatide Darcel Bayley) 2.5 MG/0.5ML Pen Inject 2.5 mg into the skin once a week. 02/10/22   Isaac Bliss, Rayford Halsted, MD  traZODone (DESYREL) 50 MG tablet Take 2 tablets (100 mg total) by mouth at bedtime. 01/08/22   Ward Givens, NP  Vitamin D, Ergocalciferol, (DRISDOL) 1.25 MG (50000 UNIT) CAPS capsule TAKE 1 CAPSULE BY MOUTH ONE TIME PER WEEK 06/26/20   Isaac Bliss, Rayford Halsted, MD    Physical Exam: Vitals:   04/09/22 1800 04/09/22 1830 04/09/22 2200 04/09/22 2248  BP: (!) 138/99 (!) 150/103 (!) 140/94 (!) 157/98  Pulse: 75 78 76 90  Resp: 18 18 17 18   Temp:    98.1 F (36.7 C)  TempSrc:      SpO2: 100% 99% 99% (!) 87%  Weight:      Height:       *** Data Reviewed: {Tip this will not be part of the note when signed- Document your independent interpretation of telemetry tracing, EKG, lab, Radiology test or any other diagnostic tests. Add any new diagnostic test ordered today. (Optional):26781} {Results:26384}  Assessment and Plan: No notes have been filed under this hospital service. Service: Hospitalist     Advance Care Planning:   Code Status: Prior ***  Consults: ***  Family Communication: ***  Severity of Illness: {Observation/Inpatient:21159}  Author: Orene Desanctis, DO 04/10/2022 12:02 AM  For on call review [email or web address].

## 2022-04-10 NOTE — Progress Notes (Signed)
Same day note  Robyn [name] is a 52 y.o. female with medical history significant of hypertension, hyperlipidemia, type 2 diabetes, obstructive sleep apnea, CKD stage IV, morbid obesity presented to hospital with increased urinary frequency lower back pain and worsening creatinine levels.  She had gone to her primary care physician for the symptoms and had worsening renal failure so she was told to come to the ED.  Follows up with [facility] as outpatient.  In the ED, patient was hypertensive with blood pressure of  160/111 on room air. Creatinine was elevated to 5.02 with EGFR of 10 from a prior of 2.9 just 5 months ago.  Potassium was normal at 5.1, sodium of 135.  Urinalysis showed negative nitrite but proteinuria.  Renal ultrasound with no hydronephrosis but possible renal stones.  Nephrology was notified and patient was then considered for admission to the hospital for further evaluation and treatment  Patient seen and examined at bedside.  Patient was admitted to the hospital for abnormal labs.  At the time of my evaluation, patient complains of low back pain.  Physical examination reveals obese female, mild pallor noted.  Laboratory data and imaging was reviewed  Assessment and Plan.  Acute on chronic kidney disease stage IV. - Creatinine elevated to 5.02 with eGFR of 10 from a prior of 2.9 just 5 months ago.  Decreased oral hydration/use of spironolactone could be contributing.  UA positive for proteinuria.  Was empirically started on IV Rocephin.  Has had a history of renal stones but renal ultrasound without any hydronephrosis.  Continue IV fluids.  Nephrology has been consulted.  Will follow recommendations.  Essential hypertension Continue amlodipine and Coreg.  Hold spironolactone and clonidine for now.  Continue to monitor closely.   Controlled type 2 diabetes mellitus - Last hemoglobin A1c of 5.9.  Will add sliding scale insulin and Accu-Cheks.   Hyperlipidemia  associated with type 2 diabetes mellitus Continue Lipitor and Zetia   Hypothyroidism Continue Synthroid   Severe obesity (BMI >= 40) Body mass index is 48.59 kg/m.  Would benefit from weight loss as outpatient.   OSA on CPAP -Continue CPAP     No Charge  Signed,  Delila Pereyra, MD [facility]

## 2022-04-10 NOTE — Assessment & Plan Note (Signed)
-   Creatinine elevated to 5.02 with eGFR of 10 from a prior of 2.9 just 5 months ago.  No new or adjustments to medication.  Has had decreased fluid intake and is on spironolactone at home which could be contributory.   -She also has increased urinary frequency although UA not grossly positive-negative leukocyte/nitrite with many bacteria.  Will continue IV Rocephin for now pending urine culture. -Also has been having constant lower back pain with history of kidney stones.  Renal ultrasound shows hypodensities bilaterally showing possible kidney stone.  Will obtain CT renal stone search to further assess for any postobstructive process. -Keep on continuous IV LR and continue to follow creatinine trend -Holding all nephrotoxic agent -Will renally dose all medications -No urgent need for dialysis or nephrology consult this time

## 2022-04-10 NOTE — Assessment & Plan Note (Signed)
Continue CPAP.  

## 2022-04-10 NOTE — H&P (Signed)
History and Physical    Patient: Robyn [name] [id number] DOB: 1970/02/11 DOA: 04/09/2022 DOS: the patient was seen and examined on 04/10/2022 PCP: Isaac Bliss, Rayford Halsted, MD  Patient coming from:  [place] [facility]  Chief Complaint:  Chief Complaint  Patient presents with   Abnormal Lab   HPI: Robyn [name] is a 52 y.o. female with medical history significant of HTN, HLD, T2DM, OSA, CKD stage 4, morbid obesity who presents for acute on chronic renal failure.   Patient has notice increasing bilateral lower back pain with increase in urinary frequency for the past week. Unclear if hematuria. Has been more fatigue. Exhausted just going up stairs. Taking more naps. Noticed hand cramping a few days ago. Went to primary for routine check up with these symptoms and had lab work showing acute renal failure and told to go to ED.  Has decrease water intake. Felt nauseous but no vomiting. No diarrhea. No NSAIDS use. Just been taking Tylenol for her lower back pain. Has hx of what could be urosepsis from kidney stones back in 90s. No new medications or adjustments. Follows [facility] Dr. Posey Pronto outpatient.    In the ED, she was afebrile mildly hypertensive with BP up to 160/111 on room air.  Creatinine has elevated to 5.02 with EGFR of 10 from a prior of 2.9 just 5 months ago.  Potassium is normal at 5.1, sodium of 135.  No leukocytosis or anemia.  UA shows negative nitrite, leukocyte, many bacteria and greater than 300 protein.  Renal ultrasound with no hydronephrosis. Possible renal stones in both kidneys. Possible 1.2cm cyst in right kidney.   EDP consulted nephrology Dr. Candiss Norse who recommended admission and can reconsult nephrology as needed. Review of Systems: As mentioned in the history of present illness. All other systems reviewed and are negative. Past Medical History:  Diagnosis Date   Abnormal Pap smear    Allergy    Anxiety    OCCAS PANIC ATTACKS   Back pain     Bilateral swelling of feet    Blood transfusion 1990'S   Blood transfusion without reported diagnosis    BV (bacterial vaginosis)    Cough    nonproductive cough last 2 weeks   Depression 2010   Fibroid 2011   Gallbladder problem    GERD (gastroesophageal reflux disease)    occasional   H/O dysmenorrhea    H/O varicella    H/O: menorrhagia 2011   Hypertension    Hypokalemia    PAST HX   Hypothyroid    Increased BMI    Kidney problem    Kidney stones    Obesity    OSA (obstructive sleep apnea) 04/22/2013   Ovarian cyst 2011   Perimenopausal symptoms 07/15/2010   Pre-diabetes    Pregnancy induced hypertension    Shortness of breath    ONLY WITH ANXIETY   Sleep apnea    PT USES CPAP SOMETIMES - SETTING IS 3   Vitamin D deficiency    Weight loss 2010   Past Surgical History:  Procedure Laterality Date   C-SECTIONS X 2     CESAREAN SECTION  1993 and [place]   COLONOSCOPY WITH PROPOFOL N/A 10/26/2018   Procedure: COLONOSCOPY WITH PROPOFOL;  Surgeon: Juanita Craver, MD;  Location: [facility];  Service: Endoscopy;  Laterality: N/A;   NEPHROLITHOTOMY  04/09/2011   Procedure: NEPHROLITHOTOMY PERCUTANEOUS;  Surgeon: Molli Hazard, MD;  Location: [facility];  Service: Urology;  Laterality: Right;       NEPHROLITHOTOMY  10/15/2011   Procedure: NEPHROLITHOTOMY PERCUTANEOUS;  Surgeon: Molli Hazard, MD;  Location: [facility];  Service: Urology;  Laterality: Left;   PERCUTANEOUS NEPHROSTOMY AROUND 1996  may 2013   right kidney   REMOVAL OF STONES  10/15/2011   Procedure: REMOVAL OF STONES;  Surgeon: Molli Hazard, MD;  Location: [facility];  Service: Urology;  Laterality: Left;   TUBAL LIGATION  1999   URETEROSCOPY  04/09/2011   Procedure: URETEROSCOPY;  Surgeon: Molli Hazard, MD;  Location: [facility];  Service: Urology;  Laterality: Right;   UTERINE ABLATION MARCH 2010  march 2011   Social History:  reports that she has quit smoking. Her smoking  use included cigarettes. She has a 3.75 pack-year smoking history. She has never used smokeless tobacco. She reports current alcohol use. She reports that she does not use drugs.  Allergies  Allergen Reactions   Sulfa Antibiotics Hives and Rash    Family History  Problem Relation Age of Onset   Diabetes Mother    Hypertension Mother    Obesity Mother    Hypertension Father    Diabetes Father    Kidney disease Father    Stroke Father    Sleep apnea Father    Diabetes Paternal Grandmother    Diabetes Maternal Grandmother    Heart disease Maternal Grandmother     Prior to Admission medications   Medication Sig Start Date End Date Taking? Authorizing Provider  ACCU-CHEK GUIDE test strip USE TO TEST BLOOD SUGAR EVERY DAY AS DIRECTED BY DOCTOR 06/17/21   Isaac Bliss, Rayford Halsted, MD  allopurinol (ZYLOPRIM) 100 MG tablet Take 100 mg by mouth daily.     [provider]  amLODipine (NORVASC) 10 MG tablet TAKE 1 TABLET BY MOUTH EVERY DAY 11/18/21   Isaac Bliss, Rayford Halsted, MD  atorvastatin (LIPITOR) 80 MG tablet TAKE 1 TABLET BY MOUTH EVERY DAY 12/30/21   Isaac Bliss, Rayford Halsted, MD  blood glucose meter kit and supplies KIT Dispense based on patient and insurance preference. Use one time daily as directed. E11.9 03/18/21   Isaac Bliss, Rayford Halsted, MD  carvedilol (COREG) 25 MG tablet Take 1 tablet (25 mg total) by mouth 2 (two) times daily with a meal. 05/15/15   Elby Beck, FNP  cloNIDine (CATAPRES) 0.1 MG tablet TAKE 1 TABLET BY MOUTH 2 TIMES DAILY. 11/18/21   Isaac Bliss, Rayford Halsted, MD  colchicine 0.6 MG tablet Take 0.6 mg by mouth daily as needed (gout).     [provider]  ezetimibe (ZETIA) 10 MG tablet TAKE 1 TABLET BY MOUTH EVERY DAY 12/30/21   Isaac Bliss, Rayford Halsted, MD  levothyroxine (SYNTHROID) 25 MCG tablet TAKE 1 TABLET BY MOUTH EVERY DAY 01/09/22   Isaac Bliss, Rayford Halsted, MD  Multiple Vitamins-Minerals (MULTIVITAMIN WITH MINERALS) tablet  Take 1 tablet by mouth daily.    [provider]  tirzepatide Darcel Bayley) 2.5 MG/0.5ML Pen Inject 2.5 mg into the skin once a week. 02/10/22   Isaac Bliss, Rayford Halsted, MD  traZODone (DESYREL) 50 MG tablet Take 2 tablets (100 mg total) by mouth at bedtime. 01/08/22   Ward Givens, NP  Vitamin D, Ergocalciferol, (DRISDOL) 1.25 MG (50000 UNIT) CAPS capsule TAKE 1 CAPSULE BY MOUTH ONE TIME PER WEEK 06/26/20   Isaac Bliss, Rayford Halsted, MD    Physical Exam: Vitals:   04/09/22 1830 04/09/22 2200 04/09/22 2248 04/09/22 2354  BP: (!) 150/103 Marland Kitchen)  140/94 (!) 157/98 (!) 129/95  Pulse: 78 76 90 86  Resp: 18 17 18 15   Temp:   98.1 F (36.7 C) 98.2 F (36.8 C)  TempSrc:    Oral  SpO2: 99% 99% (!) 87% 95%  Weight:      Height:       Constitutional: NAD, calm, comfortable, nontoxic well-appearing morbidly obese female sitting upright in bed watching TV Eyes: lids and conjunctivae normal ENMT: Mucous membranes are moist.  Neck: normal, supple Respiratory: clear to auscultation bilaterally, no wheezing, no crackles. Normal respiratory effort. No accessory muscle use.  Cardiovascular: Regular rate and rhythm, no murmurs / rubs / gallops. No extremity edema.   Abdomen: no tenderness, mild bilateral CVA tenderness. Bowel sounds positive.  Musculoskeletal: no clubbing / cyanosis. No joint deformity upper and lower extremities.  Normal muscle tone.  Skin: no rashes, lesions, ulcers. No induration Neurologic: CN 2-12 grossly intact.  Psychiatric: Normal judgment and insight. Alert and oriented x 3. Normal mood. Data Reviewed:  See HPI   Assessment and Plan: * Acute on chronic kidney failure - Creatinine elevated to 5.02 with eGFR of 10 from a prior of 2.9 just 5 months ago.  No new or adjustments to medication.  Has had decreased fluid intake and is on spironolactone at home which could be contributory.   -She also has increased urinary frequency although UA not grossly positive-negative  leukocyte/nitrite with many bacteria.  Will continue IV Rocephin for now pending urine culture. -Also has been having constant lower back pain with history of kidney stones.  Renal ultrasound shows hypodensities bilaterally showing possible kidney stone.  Will obtain CT renal stone search to further assess for any postobstructive process. -Keep on continuous IV LR and continue to follow creatinine trend -Holding all nephrotoxic agent -Will renally dose all medications -No urgent need for dialysis or nephrology consult this time  Hypertension -Hold Spirolactone and Clonidine for now due to acute renal failure -continue amlodipine and Coreg  Controlled type 2 diabetes mellitus - Last hemoglobin A1c of 5.9.  Monitor without signs give insulin for now.  Hyperlipidemia associated with type 2 diabetes mellitus - Continue atorvastatin and Zetia  Hypothyroidism - Continue home levothyroxine  Severe obesity (BMI >= 40) - BMI of 48 noted  OSA on CPAP -Continue CPAP      Advance Care Planning:Full  Consults: EDP curbsided nephrology  Family Communication: none at bedside  Severity of Illness: The appropriate patient status for this patient is INPATIENT. Inpatient status is judged to be reasonable and necessary in order to provide the required intensity of service to ensure the patient's safety. The patient's presenting symptoms, physical exam findings, and initial radiographic and laboratory data in the context of their chronic comorbidities is felt to place them at high risk for further clinical deterioration. Furthermore, it is not anticipated that the patient will be medically stable for discharge from the hospital within 2 midnights of admission.   * I certify that at the point of admission it is my clinical judgment that the patient will require inpatient hospital care spanning beyond 2 midnights from the point of admission due to high intensity of service, high risk for further  deterioration and high frequency of surveillance required.*  Author: Orene Desanctis, DO 04/10/2022 12:59 AM  For on call review [email or web address].

## 2022-04-11 DIAGNOSIS — E1169 Type 2 diabetes mellitus with other specified complication: Secondary | ICD-10-CM | POA: Diagnosis not present

## 2022-04-11 DIAGNOSIS — I10 Essential (primary) hypertension: Secondary | ICD-10-CM | POA: Diagnosis not present

## 2022-04-11 DIAGNOSIS — E1122 Type 2 diabetes mellitus with diabetic chronic kidney disease: Secondary | ICD-10-CM | POA: Diagnosis not present

## 2022-04-11 DIAGNOSIS — N179 Acute kidney failure, unspecified: Secondary | ICD-10-CM | POA: Diagnosis not present

## 2022-04-11 LAB — BASIC METABOLIC PANEL
Anion gap: 11 (ref 5–15)
BUN: 62 mg/dL — ABNORMAL HIGH (ref 6–20)
CO2: 18 mmol/L — ABNORMAL LOW (ref 22–32)
Calcium: 8.6 mg/dL — ABNORMAL LOW (ref 8.9–10.3)
Chloride: 103 mmol/L (ref 98–111)
Creatinine, Ser: 4.99 mg/dL — ABNORMAL HIGH (ref 0.44–1.00)
GFR, Estimated: 10 mL/min — ABNORMAL LOW (ref >60)
Glucose, Bld: 120 mg/dL — ABNORMAL HIGH (ref 70–99)
Potassium: 4.5 mmol/L (ref 3.5–5.1)
Sodium: 132 mmol/L — ABNORMAL LOW (ref 135–145)

## 2022-04-11 LAB — CBC
HCT: 29.9 % — ABNORMAL LOW (ref 36.0–46.0)
Hemoglobin: 10.7 g/dL — ABNORMAL LOW (ref 12.0–15.0)
MCH: 32.6 pg (ref 26.0–34.0)
MCHC: 35.8 g/dL (ref 30.0–36.0)
MCV: 91.2 fL (ref 80.0–100.0)
Platelets: 222 10*3/uL (ref 150–400)
RBC: 3.28 MIL/uL — ABNORMAL LOW (ref 3.87–5.11)
RDW: 12.9 % (ref 11.5–15.5)
WBC: 6.9 10*3/uL (ref 4.0–10.5)
nRBC: 0 % (ref 0.0–0.2)

## 2022-04-11 LAB — GLUCOSE, CAPILLARY
Glucose-Capillary: 127 mg/dL — ABNORMAL HIGH (ref 70–99)
Glucose-Capillary: 172 mg/dL — ABNORMAL HIGH (ref 70–99)
Glucose-Capillary: 124 mg/dL — ABNORMAL HIGH (ref 70–99)
Glucose-Capillary: 123 mg/dL — ABNORMAL HIGH (ref 70–99)

## 2022-04-11 LAB — MAGNESIUM: Magnesium: 2.2 mg/dL (ref 1.7–2.4)

## 2022-04-11 MED ORDER — AZITHROMYCIN 250 MG PO TABS
500.0000 mg | ORAL_TABLET | Freq: Every day | ORAL | Status: AC
  Administered 2022-04-11 – 2022-04-15 (×5): 500 mg via ORAL
  Filled 2022-04-11 (×5): qty 2

## 2022-04-11 NOTE — Progress Notes (Signed)
Pt declined assistance w/ CPAP for bed. RT infomed pt she is available if needed. RT will cont to monitor as needed.

## 2022-04-11 NOTE — Progress Notes (Addendum)
PROGRESS NOTE    Robyn [name]  YKZ:[id number] DOB: Mar 11, 1970 DOA: 04/09/2022 PCP: Philip Aspen, Limmie Patricia, MD    Brief Narrative:   Robyn [name] is a 52 y.o. female with medical history significant of hypertension, hyperlipidemia, type 2 diabetes, obstructive sleep apnea, CKD stage IV, morbid obesity presented to hospital with increased urinary frequency lower back pain and worsening creatinine levels.  She had gone to her primary care physician for the symptoms and had worsening renal failure so she was told to come to the ED.  Follows up with [facility] as outpatient.  In the ED, patient was hypertensive with blood pressure of  160/111 on room air. Creatinine was elevated to 5.02 with EGFR of 10 from a prior of 2.9 just 5 months ago.  Potassium was normal at 5.1, sodium of 135.  Urinalysis showed negative nitrite but proteinuria.  Renal ultrasound with no hydronephrosis but possible renal stones.  Nephrology was notified and patient was then considered for admission to the hospital for further evaluation and treatment  Assessment and Plan.  Acute on chronic kidney disease stage IV. - Creatinine elevated to 5.02 with eGFR of 10 from a prior of 2.9 just 5 months ago.  Decreased oral hydration/use of spironolactone could be contributing.  UA positive for proteinuria.  Was empirically started on IV Rocephin.  Has had a history of renal stones but renal ultrasound without any hydronephrosis.  Continue IV fluids.  Nephrology has been consulted.  CT scan without any hydronephrosis.  Will follow recommendations.  Creatinine today at 4.9 without significant change.  Communicated with nephrology for consultation.  Possible UTI.  On IV Rocephin.  Urine culture pending.  No leukocytosis or fever.  Urinalysis with many bacteria.  Abnormal CT chest.  Possible multifocal pneumonia as per CT scan report.  Patient does have some shortness of breath but no fever or cough or signs of pneumonia.    Patient is already on IV Rocephin.  Will add Zithromax.  Back pain.  On Tylenol as needed.  Advised warm compression.  Could be secondary to renal stones/possible UTI, basal pneumonia.  Essential hypertension Continue amlodipine and Coreg.  Hold spironolactone and clonidine for now.     Controlled type 2 diabetes mellitus - Last hemoglobin A1c of 5.9.  Will add sliding scale insulin and Accu-Cheks.   Hyperlipidemia associated with type 2 diabetes mellitus Continue Lipitor and Zetia   Hypothyroidism Continue Synthroid   Severe obesity (BMI >= 40) Body mass index is 48.59 kg/m.  Would benefit from weight loss as outpatient.   OSA on CPAP -Continue CPAP      DVT prophylaxis: heparin injection 5,000 Units Start: 04/10/22 1400   Code Status:     Code Status: Full Code  Disposition: Home likely in 1 to 2 days pending nephrology evaluation.  Status is: Inpatient Remains inpatient appropriate because: Progressive renal failure, nephrology evaluation, IV antibiotic   Family Communication: None at bedside  Consultants:  Nephrology  Procedures:  None  Antimicrobials:  Rocephin IV  Anti-infectives (From admission, onward)    Start     Dose/Rate Route Frequency Ordered Stop   04/09/22 2130  cefTRIAXone (ROCEPHIN) 1 g in sodium chloride 0.9 % 100 mL IVPB        1 g 200 mL/hr over 30 Minutes Intravenous Every 24 hours 04/09/22 2128          Subjective: Today, patient was seen and examined at bedside.  Patient complains of mild fatigue nausea  and back pain.  Objective: Vitals:   04/10/22 1430 04/10/22 2000 04/11/22 0342 04/11/22 0749  BP: 103/65 123/78 120/70 117/83  Pulse: 78 78 67 81  Resp:  16 18 19   Temp: 98.5 F (36.9 C) 98.4 F (36.9 C) 98.2 F (36.8 C) 98.2 F (36.8 C)  TempSrc: Oral Oral Oral Oral  SpO2: 97% 96% 95% 96%  Weight:      Height:        Intake/Output Summary (Last 24 hours) at 04/11/2022 1116 Last data filed at 04/10/2022 1400 Gross per 24  hour  Intake 120 ml  Output --  Net 120 ml   Filed Weights   04/09/22 1558 04/09/22 1604  Weight: 128.4 kg 128.4 kg    Physical Examination: Body mass index is 48.59 kg/m.  General: Obese built, not in obvious distress HENT:   Mild pallor noted.  Oral mucosa is moist.  Chest:  Clear breath sounds.  Diminished breath sounds bilaterally. No crackles or wheezes.  CVS: S1 &S2 heard. No murmur.  Regular rate and rhythm. Abdomen: Soft, nontender, nondistended.  Bowel sounds are heard.   Extremities: No cyanosis, clubbing or edema.  Peripheral pulses are palpable. Psych: Alert, awake and oriented, normal mood CNS:  No cranial nerve deficits.  Power equal in all extremities.   Skin: Warm and dry.  No rashes noted.  Data Reviewed:   CBC: Recent Labs  Lab 04/09/22 1608 04/11/22 0323  WBC 9.7 6.9  HGB 12.4 10.7*  HCT 34.7* 29.9*  MCV 90.8 91.2  PLT 273 222    Basic Metabolic Panel: Recent Labs  Lab 04/09/22 1052 04/09/22 1608 04/10/22 0503 04/11/22 0323  NA 133* 135 135 132*  K 5.3* 5.1 4.7 4.5  CL 106 104 104 103  CO2 21 20* 18* 18*  GLUCOSE 118* 100* 97 120*  BUN 64* 67* 61* 62*  CREATININE 5.19* 5.02* 4.92* 4.99*  CALCIUM 9.4 10.1 9.0 8.6*  MG 2.0  --   --  2.2    Liver Function Tests: Recent Labs  Lab 04/09/22 1052 04/09/22 1608  AST 16 15  ALT 24 24  ALKPHOS 85 80  BILITOT 0.8 1.1  PROT 7.4 8.1  ALBUMIN 4.3 4.7     Radiology Studies: CT RENAL STONE STUDY  Result Date: 04/10/2022 CLINICAL DATA:  Right flank pain EXAM: CT ABDOMEN AND PELVIS WITHOUT CONTRAST TECHNIQUE: Multidetector CT imaging of the abdomen and pelvis was performed following the standard protocol without IV contrast. RADIATION DOSE REDUCTION: This exam was performed according to the departmental dose-optimization program which includes automated exposure control, adjustment of the mA and/or kV according to patient size and/or use of iterative reconstruction technique. COMPARISON:   12/29/2011 FINDINGS: Lower chest: Multifocal patchy airspace consolidations within the periphery of the right middle lobe and right lower lobe. Linear left basilar atelectasis. Heart size within normal limits. Aortic and coronary artery atherosclerosis. Hepatobiliary: No focal liver abnormality is seen. Status post cholecystectomy. No biliary dilatation. Pancreas: Unremarkable. No pancreatic ductal dilatation or surrounding inflammatory changes. Spleen: Normal in size without focal abnormality. Adrenals/Urinary Tract: 1.4 cm right adrenal gland nodule with internal density of 28 HU, stable in size since 2013 and benign. Slight thickening of the left adrenal gland without discrete nodule. Punctate 2-3 mm nonobstructing stones within the upper pole of the left kidney. No right-sided renal stone. No hydronephrosis of either kidney. No ureteral calculi identified. Urinary bladder within normal limits. Stomach/Bowel: Tiny hiatal hernia. Stomach is otherwise within normal limits. Appendix  appears normal. No evidence of bowel wall thickening, distention, or inflammatory changes. Vascular/Lymphatic: Scattered aortoiliac atherosclerotic calcifications without aneurysm. No abdominopelvic lymphadenopathy. Reproductive: Uterus and bilateral adnexa are unremarkable. Other: No free fluid. No abdominopelvic fluid collection. No pneumoperitoneum. No abdominal wall hernia. Musculoskeletal: No acute or significant osseous findings. Appearance of the sacroiliac joints compatible with sequela of sacroiliitis. IMPRESSION: 1. Multifocal patchy airspace consolidations within the periphery of the right middle lobe and right lower lobe, suspicious for multifocal pneumonia. 2. Punctate nonobstructing left renal stones.  No hydronephrosis. 3. Aortic atherosclerosis (ICD10-I70.0). Electronically Signed   By: Duanne GuessNicholas  Plundo D.O.   On: 04/10/2022 09:07   DG Chest Portable 1 View  Result Date: 04/09/2022 CLINICAL DATA:  Shortness of breath  EXAM: PORTABLE CHEST 1 VIEW COMPARISON:  X-ray 07/25/2012 FINDINGS: Is some linear opacity at the right lung base likely scar or atelectasis. Normal cardiopericardial silhouette. No consolidation, pneumothorax or effusion. No edema. Overlapping cardiac leads. Degenerative changes along the spine with slight curvature. IMPRESSION: Mild right basilar atelectasis.  No consolidation. Electronically Signed   By: Karen KaysAshok  Gupta M.D.   On: 04/09/2022 19:47   US Renal  Result Date: 04/09/2022 CLINICAL DATA:  Renal dysfunction EXAM: RENAL / URINARY TRACT ULTRASOUND COMPLETE COMPARISON:  CT done on 12/29/2011 FINDINGS: Examination was technically difficult due to patient's body habitus. Right Kidney: Renal measurements: 9.9 x 5.2 x 5 cm = volume: 134.3 mL. There is increased cortical echogenicity suggesting medical renal disease. There is 6 mm hyperechoic focus in the midportion of right kidney, possibly renal stone or calcification in the renal cortex. There is 1.2 cm anechoic structure in right kidney. Focal bulge in the lateral upper pole of right kidney seen in the previous CT could not be distinctly visualized in the submitted images. Left Kidney: Renal measurements: 10.3 x 5.1 x 6.5 cm = volume: 179.1 mL. There is no hydronephrosis. There is increased cortical echogenicity. There is 10 mm echogenic focus in the midportion of left kidney, possibly a calculus or calcification in the renal cortex. Bladder: Appears normal for degree of bladder distention. Other: None. IMPRESSION: There is no hydronephrosis. There is increased cortical echogenicity in both kidneys suggesting medical renal disease. There are small hyperechoic foci in both kidneys suggesting possible renal stones. There is possible 1.2 cm cyst in the right kidney. Electronically Signed   By: Ernie AvenaPalani  Rathinasamy M.D.   On: 04/09/2022 19:24      LOS: 2 days    Joycelyn DasLaxman Brinley Rosete, MD [facility] Available via Epic secure chat 7am-7pm After these  hours, please refer to coverage provider listed on [email or web address] 04/11/2022, 11:16 AM

## 2022-04-11 NOTE — Consult Note (Signed)
Reason for Consult: Renal failure Referring Physician:  Dr. Tyson BabinskiPokhrel  Chief Complaint: Abnormal labs  Assessment/Plan: Acute of chronic kidney disease IIIb/IV: followed by Dr. Allena KatzPatel with  a baseline creatinine which appears to be in the the 2.8-3 range last seen in the office 11/06/2021. Patient has had 3+ proteinuria but a non active sediment on microscopy.  Acute decline in renal function almost certainly associated with the infection with CT showing RML/RLL PNA; 2-3 mm nephrolith in LK not causing obstruction. She certainly could be in early ATN but fortunately no absolute indications for dialysis. Will also check a UPC, microalbumin/cr ratio isn't correlating with the 3+ proteinuria; will also check a SPEP and FLC. - Supportive care at this time and a tincture of time.  -Maintain MAP>65 for optimal renal perfusion.  - Avoid nephrotoxic agents such as IV contrast, NSAIDs, and phosphate containing bowel preps (FLEETS) - Preferred narcotic agents for pain control are hydromorphone, fentanyl, and methadone. Morphine should not be used.  - Avoid Baclofen and avoid oral sodium phosphate and magnesium citrate based laxatives / bowel preps.  - Continue strict Input and Output monitoring. Will monitor the patient closely with you and intervene or adjust therapy as indicated by changes in clinical status/labs   Back pain possibly from a UTI? In the setting of a pt with a strong h/o nephrolithiasis.  Hypertension: spironolactone and clonidine on hold now. Dm: long standing but no h/o retinopathy. Hypothyroidisim OSA Obesity   HPI: Robyn [name] is an 52 y.o. female with HTN, HLD, DM, OSA, morbid obesity, recurrent nephrolithiasis, CKD4 presenting with bilateral lower back pain associated with increased urinary frequency for the past week. She has had fatigue and also difficulty going up stairs with more naps as well. When she went to her primary care doctor for a routine check the labs showed acute  renal failure and she was sent to the ED. Patient has  had decreased PO intake with nausea but no vomiting; patient denies any diarrhea or NSAID use, only taking Tylenol for pain.  Patient also complaining of cramping in the extremities, fevers but denies dysuria or hematuria; she states that the urine has been more foamy as well. Urinalysis showed bacteria and the patient was started on Rocephin. CT scan showed 2-443mm nephrolith in the LK and also suspicious findings for a lower lobe PNA.  Renal ultrasound showed no evidence of obstruction but with possible nephroliathiasis.  ROS Pertinent items are noted in HPI.  Chemistry and CBC: Creatinine  Date/Time Value Ref Range Status  10/31/2021 12:00 AM 2.9 (A) 0.5 - 1.1 Final  01/14/2021 12:00 AM 2.2 (A) 0.5 - 1.1 Final   Creat  Date/Time Value Ref Range Status  11/17/2019 09:03 AM 1.57 (H) 0.50 - 1.10 mg/dL Final  96/04/540905/26/2014 81:1904:53 PM 1.14 (H) 0.50 - 1.10 mg/dL Final  14/78/295601/16/2014 21:3002:25 PM 1.05 0.50 - 1.10 mg/dL Final   Creatinine, Ser  Date/Time Value Ref Range Status  04/11/2022 03:23 AM 4.99 (H) 0.44 - 1.00 mg/dL Final  86/57/846904/04/2022 62:9505:03 AM 4.92 (H) 0.44 - 1.00 mg/dL Final  28/41/324404/03/2022 01:0204:08 PM 5.02 (H) 0.44 - 1.00 mg/dL Final  72/53/664404/03/2022 03:4710:52 AM 5.19 (HH) 0.40 - 1.20 mg/dL Final  42/59/563806/30/2023 75:6412:03 PM 1.76 (H) 0.40 - 1.20 mg/dL Final  33/29/518805/10/2021 41:6609:13 AM 2.18 (H) 0.40 - 1.20 mg/dL Final  06/30/160105/18/2021 09:3205:27 PM 1.52 (H) 0.44 - 1.00 mg/dL Final  35/57/322011/23/2020 25:4208:15 AM 1.42 (H) 0.57 - 1.00 mg/dL Final  70/62/376207/22/2020 83:1502:52 PM 1.53 (H)  0.57 - 1.00 mg/dL Final  52/84/1324 40:10 PM 2.12 (H) 0.57 - 1.00 mg/dL Final  27/25/3664 40:34 AM 1.61 (H) 0.57 - 1.00 mg/dL Final  74/25/9563 87:56 AM 1.60 (H) 0.57 - 1.00 mg/dL Final  43/32/9518 84:16 PM 1.76 (H) 0.57 - 1.00 mg/dL Final  60/63/0160 10:93 PM 1.20 (H) 0.50 - 1.10 mg/dL Final  23/55/7322 02:54 PM 1.30 (H) 0.50 - 1.10 mg/dL Final  27/06/2374 28:31 PM 1.39 (H) 0.50 - 1.10 mg/dL Final  51/76/1607 37:10  AM 1.64 (H) 0.50 - 1.10 mg/dL Final    Comment:    DELTA CHECK NOTED  12/31/2011 04:40 AM 0.90 0.50 - 1.10 mg/dL Final    Comment:    DELTA CHECK NOTED REPEATED TO VERIFY  12/30/2011 04:43 AM 1.40 (H) 0.50 - 1.10 mg/dL Final  62/69/4854 62:70 PM 1.91 (H) 0.50 - 1.10 mg/dL Final  35/00/9381 82:99 AM 2.00 (H) 0.50 - 1.10 mg/dL Final    Comment:    RESULT REPEATED AND VERIFIED DELTA CHECK NOTED  12/28/2011 04:50 PM 1.28 (H) 0.50 - 1.10 mg/dL Final  37/16/9678 93:81 PM 1.17 (H) 0.50 - 1.10 mg/dL Final  01/75/1025 85:27 AM 1.25 (H) 0.50 - 1.10 mg/dL Final  78/24/2353 61:44 PM 1.43 (H) 0.50 - 1.10 mg/dL Final  31/54/0086 76:19 PM 1.15 (H) 0.50 - 1.10 mg/dL Final  50/93/2671 24:58 AM 1.27 (H) 0.50 - 1.10 mg/dL Final  09/98/3382 50:53 PM 1.30 (H) 0.50 - 1.10 mg/dL Final  97/67/3419 37:90 PM 1.70 (H) 0.50 - 1.10 mg/dL Final  24/09/7351 29:92 AM 1.42 (H) 0.50 - 1.10 mg/dL Final  42/68/3419 62:22 AM 1.77 (H) 0.50 - 1.10 mg/dL Final  97/98/9211 94:17 AM 1.09 0.50 - 1.10 mg/dL Final  40/81/4481 85:63 AM 0.85 0.50 - 1.10 mg/dL Final  14/97/0263 78:58 AM 0.77 0.50 - 1.10 mg/dL Final  85/02/7739 28:78 PM 1.04 0.50 - 1.10 mg/dL Final  67/67/2094 70:96 PM 0.85 0.50 - 1.10 mg/dL Final  28/36/6294 76:54 AM 0.69 0.50 - 1.10 mg/dL Final  65/03/5463 68:12 PM 0.86 0.50 - 1.10 mg/dL Final  75/17/0017 49:44 AM 0.84 0.50 - 1.10 mg/dL Final  96/75/9163 84:66 PM 0.83 0.50 - 1.10 mg/dL Final  59/93/5701 77:93 AM 0.69 0.50 - 1.10 mg/dL Final  90/30/0923 30:07 AM 0.57 0.50 - 1.10 mg/dL Final  62/26/3335 45:62 AM 0.58 0.50 - 1.10 mg/dL Final  56/38/9373 42:87 AM 0.73 0.50 - 1.10 mg/dL Final  68/11/5724 20:35 AM 0.92 0.50 - 1.10 mg/dL Final  59/74/1638 45:36 PM 0.76 0.4 - 1.2 mg/dL Final   Recent Labs  Lab 04/09/22 1052 04/09/22 1608 04/10/22 0503 04/11/22 0323  NA 133* 135 135 132*  K 5.3* 5.1 4.7 4.5  CL 106 104 104 103  CO2 21 20* 18* 18*  GLUCOSE 118* 100* 97 120*  BUN 64* 67* 61* 62*   CREATININE 5.19* 5.02* 4.92* 4.99*  CALCIUM 9.4 10.1 9.0 8.6*   Recent Labs  Lab 04/09/22 1608 04/11/22 0323  WBC 9.7 6.9  HGB 12.4 10.7*  HCT 34.7* 29.9*  MCV 90.8 91.2  PLT 273 222   Liver Function Tests: Recent Labs  Lab 04/09/22 1052 04/09/22 1608  AST 16 15  ALT 24 24  ALKPHOS 85 80  BILITOT 0.8 1.1  PROT 7.4 8.1  ALBUMIN 4.3 4.7   No results for input(s): "LIPASE", "AMYLASE" in the last 168 hours. No results for input(s): "AMMONIA" in the last 168 hours. Cardiac Enzymes: No results for input(s): "CKTOTAL", "CKMB", "CKMBINDEX", "TROPONINI" in the last  168 hours. Iron Studies: No results for input(s): "IRON", "TIBC", "TRANSFERRIN", "FERRITIN" in the last 72 hours. PT/INR: @LABRCNTIP (inr:5)  Xrays/Other Studies: ) Results for orders placed or performed during the hospital encounter of 04/09/22 (from the past 48 hour(s))  Comprehensive metabolic panel     Status: Abnormal   Collection Time: 04/09/22  4:08 PM  Result Value Ref Range   Sodium 135 135 - 145 mmol/L   Potassium 5.1 3.5 - 5.1 mmol/L   Chloride 104 98 - 111 mmol/L   CO2 20 (L) 22 - 32 mmol/L   Glucose, Bld 100 (H) 70 - 99 mg/dL    Comment: Glucose reference range applies only to samples taken after fasting for at least 8 hours.   BUN 67 (H) 6 - 20 mg/dL   Creatinine, Ser 1.615.02 (H) 0.44 - 1.00 mg/dL   Calcium 09.610.1 8.9 - 04.510.3 mg/dL   Total Protein 8.1 6.5 - 8.1 g/dL   Albumin 4.7 3.5 - 5.0 g/dL   AST 15 15 - 41 U/L   ALT 24 0 - 44 U/L   Alkaline Phosphatase 80 38 - 126 U/L   Total Bilirubin 1.1 0.3 - 1.2 mg/dL   GFR, Estimated 10 (L) >60 mL/min    Comment: (NOTE) Calculated using the CKD-EPI Creatinine Equation (2021)    Anion gap 11 5 - 15    Comment: Performed at [facility], [address], [place], [place] [address]  CBC     Status: Abnormal   Collection Time: 04/09/22  4:08 PM  Result Value Ref Range   WBC 9.7 4.0 - 10.5 K/uL   RBC 3.82 (L) 3.87 - 5.11 MIL/uL    Hemoglobin 12.4 12.0 - 15.0 g/dL   HCT 19.134.7 (L) 47.836.0 - 29.546.0 %   MCV 90.8 80.0 - 100.0 fL   MCH 32.5 26.0 - 34.0 pg   MCHC 35.7 30.0 - 36.0 g/dL   RDW 62.113.0 30.811.5 - 65.715.5 %   Platelets 273 150 - 400 K/uL   nRBC 0.0 0.0 - 0.2 %    Comment: Performed at [facility], [address], [place], [place] [address]  Urinalysis, Routine w reflex microscopic -Urine, Clean Catch     Status: Abnormal   Collection Time: 04/09/22  4:08 PM  Result Value Ref Range   Color, Urine YELLOW YELLOW   APPearance HAZY (A) CLEAR   Specific Gravity, Urine 1.011 1.005 - 1.030   pH 6.0 5.0 - 8.0   Glucose, UA 250 (A) NEGATIVE mg/dL   Hgb urine dipstick NEGATIVE NEGATIVE   Bilirubin Urine NEGATIVE NEGATIVE   Ketones, ur NEGATIVE NEGATIVE mg/dL   Protein, ur >295>300 (A) NEGATIVE mg/dL   Nitrite NEGATIVE NEGATIVE   Leukocytes,Ua NEGATIVE NEGATIVE   RBC / HPF 0-5 0 - 5 RBC/hpf   WBC, UA 0-5 0 - 5 WBC/hpf   Bacteria, UA MANY (A) NONE SEEN   Squamous Epithelial / HPF 0-5 0 - 5 /HPF    Comment: Performed at [facility], [address], [place], [place] [address]  Glucose, capillary     Status: Abnormal   Collection Time: 04/09/22 10:48 PM  Result Value Ref Range   Glucose-Capillary 173 (H) 70 - 99 mg/dL    Comment: Glucose reference range applies only to samples taken after fasting for at least 8 hours.  HIV Antibody (routine testing w rflx)     Status: None   Collection Time: 04/10/22  5:03 AM  Result Value Ref Range  HIV Screen 4th Generation wRfx Non Reactive Non Reactive    Comment: Performed at [facility], [address] [place], [place] [address]  Basic metabolic panel     Status: Abnormal   Collection Time: 04/10/22  5:03 AM  Result Value Ref Range   Sodium 135 135 - 145 mmol/L   Potassium 4.7 3.5 - 5.1 mmol/L   Chloride 104 98 - 111 mmol/L   CO2 18 (L) 22 - 32 mmol/L   Glucose, Bld 97 70 - 99 mg/dL    Comment: Glucose reference range applies only to  samples taken after fasting for at least 8 hours.   BUN 61 (H) 6 - 20 mg/dL   Creatinine, Ser 6.04 (H) 0.44 - 1.00 mg/dL   Calcium 9.0 8.9 - 54.0 mg/dL   GFR, Estimated 10 (L) >60 mL/min    Comment: (NOTE) Calculated using the CKD-EPI Creatinine Equation (2021)    Anion gap 13 5 - 15    Comment: Performed at [facility], [address], [place], [place] [address]  Glucose, capillary     Status: Abnormal   Collection Time: 04/10/22  7:42 AM  Result Value Ref Range   Glucose-Capillary 121 (H) 70 - 99 mg/dL    Comment: Glucose reference range applies only to samples taken after fasting for at least 8 hours.  Glucose, capillary     Status: Abnormal   Collection Time: 04/10/22 11:12 AM  Result Value Ref Range   Glucose-Capillary 151 (H) 70 - 99 mg/dL    Comment: Glucose reference range applies only to samples taken after fasting for at least 8 hours.  Glucose, capillary     Status: Abnormal   Collection Time: 04/10/22  4:09 PM  Result Value Ref Range   Glucose-Capillary 113 (H) 70 - 99 mg/dL    Comment: Glucose reference range applies only to samples taken after fasting for at least 8 hours.  Glucose, capillary     Status: Abnormal   Collection Time: 04/10/22  7:59 PM  Result Value Ref Range   Glucose-Capillary 118 (H) 70 - 99 mg/dL    Comment: Glucose reference range applies only to samples taken after fasting for at least 8 hours.  Basic metabolic panel     Status: Abnormal   Collection Time: 04/11/22  3:23 AM  Result Value Ref Range   Sodium 132 (L) 135 - 145 mmol/L   Potassium 4.5 3.5 - 5.1 mmol/L   Chloride 103 98 - 111 mmol/L   CO2 18 (L) 22 - 32 mmol/L   Glucose, Bld 120 (H) 70 - 99 mg/dL    Comment: Glucose reference range applies only to samples taken after fasting for at least 8 hours.   BUN 62 (H) 6 - 20 mg/dL   Creatinine, Ser 1.47 (H) 0.44 - 1.00 mg/dL   Calcium 8.6 (L) 8.9 - 10.3 mg/dL   GFR, Estimated 10 (L) >60 mL/min    Comment: (NOTE) Calculated using  the CKD-EPI Creatinine Equation (2021)    Anion gap 11 5 - 15    Comment: Performed at [facility], [address], [place], [place] [address]  CBC     Status: Abnormal   Collection Time: 04/11/22  3:23 AM  Result Value Ref Range   WBC 6.9 4.0 - 10.5 K/uL   RBC 3.28 (L) 3.87 - 5.11 MIL/uL   Hemoglobin 10.7 (L) 12.0 - 15.0 g/dL   HCT 21.3 (L) 08.6 - 57.8 %  MCV 91.2 80.0 - 100.0 fL   MCH 32.6 26.0 - 34.0 pg   MCHC 35.8 30.0 - 36.0 g/dL   RDW 16.1 09.6 - 04.5 %   Platelets 222 150 - 400 K/uL   nRBC 0.0 0.0 - 0.2 %    Comment: Performed at [facility], [address] [place], [place] [address]  Magnesium     Status: None   Collection Time: 04/11/22  3:23 AM  Result Value Ref Range   Magnesium 2.2 1.7 - 2.4 mg/dL    Comment: Performed at [facility], [address] [place], [place] [address]  Glucose, capillary     Status: Abnormal   Collection Time: 04/11/22  7:51 AM  Result Value Ref Range   Glucose-Capillary 127 (H) 70 - 99 mg/dL    Comment: Glucose reference range applies only to samples taken after fasting for at least 8 hours.  Glucose, capillary     Status: Abnormal   Collection Time: 04/11/22 11:38 AM  Result Value Ref Range   Glucose-Capillary 172 (H) 70 - 99 mg/dL    Comment: Glucose reference range applies only to samples taken after fasting for at least 8 hours.   CT RENAL STONE STUDY  Result Date: 04/10/2022 CLINICAL DATA:  Right flank pain EXAM: CT ABDOMEN AND PELVIS WITHOUT CONTRAST TECHNIQUE: Multidetector CT imaging of the abdomen and pelvis was performed following the standard protocol without IV contrast. RADIATION DOSE REDUCTION: This exam was performed according to the departmental dose-optimization program which includes automated exposure control, adjustment of the mA and/or kV according to patient size and/or use of iterative reconstruction technique. COMPARISON:  12/29/2011 FINDINGS: Lower chest: Multifocal patchy airspace consolidations  within the periphery of the right middle lobe and right lower lobe. Linear left basilar atelectasis. Heart size within normal limits. Aortic and coronary artery atherosclerosis. Hepatobiliary: No focal liver abnormality is seen. Status post cholecystectomy. No biliary dilatation. Pancreas: Unremarkable. No pancreatic ductal dilatation or surrounding inflammatory changes. Spleen: Normal in size without focal abnormality. Adrenals/Urinary Tract: 1.4 cm right adrenal gland nodule with internal density of 28 HU, stable in size since 2013 and benign. Slight thickening of the left adrenal gland without discrete nodule. Punctate 2-3 mm nonobstructing stones within the upper pole of the left kidney. No right-sided renal stone. No hydronephrosis of either kidney. No ureteral calculi identified. Urinary bladder within normal limits. Stomach/Bowel: Tiny hiatal hernia. Stomach is otherwise within normal limits. Appendix appears normal. No evidence of bowel wall thickening, distention, or inflammatory changes. Vascular/Lymphatic: Scattered aortoiliac atherosclerotic calcifications without aneurysm. No abdominopelvic lymphadenopathy. Reproductive: Uterus and bilateral adnexa are unremarkable. Other: No free fluid. No abdominopelvic fluid collection. No pneumoperitoneum. No abdominal wall hernia. Musculoskeletal: No acute or significant osseous findings. Appearance of the sacroiliac joints compatible with sequela of sacroiliitis. IMPRESSION: 1. Multifocal patchy airspace consolidations within the periphery of the right middle lobe and right lower lobe, suspicious for multifocal pneumonia. 2. Punctate nonobstructing left renal stones.  No hydronephrosis. 3. Aortic atherosclerosis (ICD10-I70.0). Electronically Signed   By: Duanne Guess D.O.   On: 04/10/2022 09:07   DG Chest Portable 1 View  Result Date: 04/09/2022 CLINICAL DATA:  Shortness of breath EXAM: PORTABLE CHEST 1 VIEW COMPARISON:  X-ray 07/25/2012 FINDINGS: Is some  linear opacity at the right lung base likely scar or atelectasis. Normal cardiopericardial silhouette. No consolidation, pneumothorax or effusion. No edema. Overlapping cardiac leads. Degenerative changes along the spine with slight curvature. IMPRESSION: Mild right basilar atelectasis.  No consolidation. Electronically Signed   By: Karen Kays M.D.   On: 04/09/2022 19:47   US Renal  Result Date: 04/09/2022 CLINICAL DATA:  Renal dysfunction EXAM: RENAL / URINARY TRACT ULTRASOUND COMPLETE COMPARISON:  CT done on 12/29/2011 FINDINGS: Examination was technically difficult due to patient's body habitus. Right Kidney: Renal measurements: 9.9 x 5.2 x 5 cm = volume: 134.3 mL. There is increased cortical echogenicity suggesting medical renal disease. There is 6 mm hyperechoic focus in the midportion of right kidney, possibly renal stone or calcification in the renal cortex. There is 1.2 cm anechoic structure in right kidney. Focal bulge in the lateral upper pole of right kidney seen in the previous CT could not be distinctly visualized in the submitted images. Left Kidney: Renal measurements: 10.3 x 5.1 x 6.5 cm = volume: 179.1 mL. There is no hydronephrosis. There is increased cortical echogenicity. There is 10 mm echogenic focus in the midportion of left kidney, possibly a calculus or calcification in the renal cortex. Bladder: Appears normal for degree of bladder distention. Other: None. IMPRESSION: There is no hydronephrosis. There is increased cortical echogenicity in both kidneys suggesting medical renal disease. There are small hyperechoic foci in both kidneys suggesting possible renal stones. There is possible 1.2 cm cyst in the right kidney. Electronically Signed   By: Ernie Avena M.D.   On: 04/09/2022 19:24    PMH:   Past Medical History:  Diagnosis Date   Abnormal Pap smear    Allergy    Anxiety    OCCAS PANIC ATTACKS   Back pain    Bilateral swelling of feet    Blood transfusion 1990'S    Blood transfusion without reported diagnosis    BV (bacterial vaginosis)    Cough    nonproductive cough last 2 weeks   Depression 2010   Fibroid 2011   Gallbladder problem    GERD (gastroesophageal reflux disease)    occasional   H/O dysmenorrhea    H/O varicella    H/O: menorrhagia 2011   Hypertension    Hypokalemia    PAST HX   Hypothyroid    Increased BMI    Kidney problem    Kidney stones    Obesity    OSA (obstructive sleep apnea) 04/22/2013   Ovarian cyst 2011   Perimenopausal symptoms 07/15/2010   Pre-diabetes    Pregnancy induced hypertension    Shortness of breath    ONLY WITH ANXIETY   Sleep apnea    PT USES CPAP SOMETIMES - SETTING IS 3   Vitamin D deficiency    Weight loss 2010    PSH:   Past Surgical History:  Procedure Laterality Date   C-SECTIONS X 2     CESAREAN SECTION  1993 and 1999   CHOLECYSTECTOMY  1994   COLONOSCOPY WITH PROPOFOL N/A 10/26/2018   Procedure: COLONOSCOPY WITH PROPOFOL;  Surgeon: Charna Elizabeth, MD;  Location: [facility];  Service: Endoscopy;  Laterality: N/A;   NEPHROLITHOTOMY  04/09/2011   Procedure: NEPHROLITHOTOMY PERCUTANEOUS;  Surgeon: Milford Cage, MD;  Location: [facility];  Service: Urology;  Laterality: Right;       NEPHROLITHOTOMY  10/15/2011   Procedure: NEPHROLITHOTOMY PERCUTANEOUS;  Surgeon: Milford Cage, MD;  Location: [facility];  Service: Urology;  Laterality: Left;   PERCUTANEOUS NEPHROSTOMY AROUND 1996  may 2013   right kidney   REMOVAL OF STONES  10/15/2011   Procedure: REMOVAL OF STONES;  Surgeon: Milford Cage, MD;  Location: [facility];  Service: Urology;  Laterality: Left;   TUBAL LIGATION  1999   URETEROSCOPY  04/09/2011   Procedure: URETEROSCOPY;  Surgeon: Milford Cage, MD;  Location: [facility];  Service: Urology;  Laterality: Right;   UTERINE ABLATION MARCH 2010  march 2011    Allergies:  Allergies  Allergen Reactions   Sulfa Antibiotics Hives and Rash    Medications:   Prior to  Admission medications   Medication Sig Start Date End Date Taking? Authorizing Provider  allopurinol (ZYLOPRIM) 100 MG tablet Take 100 mg by mouth daily.    Yes [provider]  amLODipine (NORVASC) 10 MG tablet TAKE 1 TABLET BY MOUTH EVERY DAY 11/18/21  Yes Philip Aspen, Limmie Patricia, MD  atorvastatin (LIPITOR) 80 MG tablet TAKE 1 TABLET BY MOUTH EVERY DAY 12/30/21  Yes Philip Aspen, Limmie Patricia, MD  carvedilol (COREG) 25 MG tablet Take 1 tablet (25 mg total) by mouth 2 (two) times daily with a meal. 05/15/15  Yes Emi Belfast, FNP  cloNIDine (CATAPRES) 0.1 MG tablet TAKE 1 TABLET BY MOUTH 2 TIMES DAILY. 11/18/21  Yes Philip Aspen, Limmie Patricia, MD  ezetimibe (ZETIA) 10 MG tablet TAKE 1 TABLET BY MOUTH EVERY DAY 12/30/21  Yes Philip Aspen, Limmie Patricia, MD  levothyroxine (SYNTHROID) 25 MCG tablet TAKE 1 TABLET BY MOUTH EVERY DAY 01/09/22  Yes Philip Aspen, Limmie Patricia, MD  Multiple Vitamins-Minerals (MULTIVITAMIN WITH MINERALS) tablet Take 1 tablet by mouth daily.   Yes [provider]  spironolactone (ALDACTONE) 100 MG tablet spironolactone 100 mg tablet TAKE 1 TABLET BY MOUTH EVERY DAY 02/10/21  Yes [provider]  tirzepatide Greggory Keen) 2.5 MG/0.5ML Pen Inject 2.5 mg into the skin once a week. 02/10/22  Yes Philip Aspen, Limmie Patricia, MD  traZODone (DESYREL) 50 MG tablet Take 2 tablets (100 mg total) by mouth at bedtime. 01/08/22  Yes Butch Penny, NP  Vitamin D, Ergocalciferol, (DRISDOL) 1.25 MG (50000 UNIT) CAPS capsule TAKE 1 CAPSULE BY MOUTH ONE TIME PER WEEK 06/26/20  Yes Philip Aspen, Limmie Patricia, MD  ACCU-CHEK GUIDE test strip USE TO TEST BLOOD SUGAR EVERY DAY AS DIRECTED BY DOCTOR 06/17/21   Philip Aspen, Limmie Patricia, MD  blood glucose meter kit and supplies KIT Dispense based on patient and insurance preference. Use one time daily as directed. E11.9 03/18/21   Philip Aspen, Limmie Patricia, MD  colchicine 0.6 MG tablet Take 0.6 mg by mouth daily as needed (gout).   Patient not taking: Reported on 04/11/2022    [provider]    Discontinued Meds:   Medications Discontinued During This Encounter  Medication Reason   0.9 %  sodium chloride infusion     Social History:  reports that she has quit smoking. Her smoking use included cigarettes. She has a 3.75 pack-year smoking history. She has never used smokeless tobacco. She reports current alcohol use. She reports that she does not use drugs.  Family History:   Family History  Problem Relation Age of Onset   Diabetes Mother    Hypertension Mother    Obesity Mother    Hypertension Father    Diabetes Father    Kidney disease Father    Stroke Father    Sleep apnea Father    Diabetes Paternal Grandmother    Diabetes Maternal Grandmother    Heart disease Maternal Grandmother     Blood pressure 117/83, pulse 81, temperature 98.2 F (36.8 C), temperature source Oral, resp. rate 19, height 5\' 4"  (1.626 m), weight 128.4  kg, SpO2 96 %. General appearance: alert, cooperative, appears stated age, and obese Head: Normocephalic, without obvious abnormality, atraumatic Neck: no adenopathy, no carotid bruit, supple, symmetrical, trachea midline, and thyroid not enlarged, symmetric, no tenderness/mass/nodules Back: symmetric, no curvature. ROM normal. No CVA tenderness. Resp: clear to auscultation bilaterally Cardio: regular rate and rhythm GI: soft, non-tender; bowel sounds normal; no masses,  no organomegaly Extremities: edema tr Pulses: 2+ and symmetric       Jolan Upchurch, Len Blalock, MD 04/11/2022, 12:57 PM

## 2022-04-12 DIAGNOSIS — N179 Acute kidney failure, unspecified: Secondary | ICD-10-CM | POA: Diagnosis not present

## 2022-04-12 DIAGNOSIS — I10 Essential (primary) hypertension: Secondary | ICD-10-CM | POA: Diagnosis not present

## 2022-04-12 DIAGNOSIS — E1169 Type 2 diabetes mellitus with other specified complication: Secondary | ICD-10-CM | POA: Diagnosis not present

## 2022-04-12 DIAGNOSIS — E1122 Type 2 diabetes mellitus with diabetic chronic kidney disease: Secondary | ICD-10-CM | POA: Diagnosis not present

## 2022-04-12 LAB — URINE CULTURE

## 2022-04-12 LAB — CBC
HCT: 30.3 % — ABNORMAL LOW (ref 36.0–46.0)
Hemoglobin: 10.9 g/dL — ABNORMAL LOW (ref 12.0–15.0)
MCH: 32.7 pg (ref 26.0–34.0)
MCHC: 36 g/dL (ref 30.0–36.0)
MCV: 91 fL (ref 80.0–100.0)
Platelets: 234 10*3/uL (ref 150–400)
RBC: 3.33 MIL/uL — ABNORMAL LOW (ref 3.87–5.11)
RDW: 12.8 % (ref 11.5–15.5)
WBC: 7.5 10*3/uL (ref 4.0–10.5)
nRBC: 0 % (ref 0.0–0.2)

## 2022-04-12 LAB — BASIC METABOLIC PANEL
Anion gap: 13 (ref 5–15)
BUN: 59 mg/dL — ABNORMAL HIGH (ref 6–20)
CO2: 19 mmol/L — ABNORMAL LOW (ref 22–32)
Calcium: 8.9 mg/dL (ref 8.9–10.3)
Chloride: 100 mmol/L (ref 98–111)
Creatinine, Ser: 5 mg/dL — ABNORMAL HIGH (ref 0.44–1.00)
GFR, Estimated: 10 mL/min — ABNORMAL LOW (ref >60)
Glucose, Bld: 104 mg/dL — ABNORMAL HIGH (ref 70–99)
Potassium: 4.7 mmol/L (ref 3.5–5.1)
Sodium: 132 mmol/L — ABNORMAL LOW (ref 135–145)

## 2022-04-12 LAB — HEPATITIS B SURFACE ANTIBODY,QUALITATIVE: Hep B S Ab: NONREACTIVE

## 2022-04-12 LAB — HEPATITIS B SURFACE ANTIGEN: Hepatitis B Surface Ag: NONREACTIVE

## 2022-04-12 LAB — GLUCOSE, CAPILLARY
Glucose-Capillary: 127 mg/dL — ABNORMAL HIGH (ref 70–99)
Glucose-Capillary: 123 mg/dL — ABNORMAL HIGH (ref 70–99)
Glucose-Capillary: 124 mg/dL — ABNORMAL HIGH (ref 70–99)
Glucose-Capillary: 158 mg/dL — ABNORMAL HIGH (ref 70–99)

## 2022-04-12 LAB — HEPATITIS B CORE ANTIBODY, TOTAL: Hep B Core Total Ab: NONREACTIVE

## 2022-04-12 LAB — HEPATITIS C ANTIBODY: HCV Ab: NONREACTIVE

## 2022-04-12 LAB — MAGNESIUM: Magnesium: 2.1 mg/dL (ref 1.7–2.4)

## 2022-04-12 MED ORDER — LIDOCAINE 5 % EX PTCH
1.0000 | MEDICATED_PATCH | CUTANEOUS | Status: DC
  Administered 2022-04-12 – 2022-04-15 (×2): 1 via TRANSDERMAL
  Filled 2022-04-12 (×2): qty 1

## 2022-04-12 NOTE — Progress Notes (Signed)
PROGRESS NOTE    Robyn [name]  UJW:[id number] DOB: 05/26/1970 DOA: 04/09/2022 PCP: Philip Aspen, Limmie Patricia, MD    Brief Narrative:   Robyn [name] is a 52 y.o. female with medical history significant of hypertension, hyperlipidemia, type 2 diabetes, obstructive sleep apnea, CKD stage IV, morbid obesity presented to hospital with increased urinary frequency lower back pain and worsening creatinine levels.  She had gone to her primary care physician for the symptoms and had worsening renal failure so she was told to come to the ED.  Follows up with [facility] as outpatient.  In the ED, patient was hypertensive with blood pressure of  160/111 on room air. Creatinine was elevated to 5.02 with EGFR of 10 from a prior of 2.9 just 5 months ago.  Potassium was normal at 5.1, sodium of 135.  Urinalysis showed negative nitrite but proteinuria.  Renal ultrasound with no hydronephrosis but possible renal stones.  Nephrology was notified and patient was then considered for admission to the hospital for further evaluation and treatment  Assessment and Plan.  Acute on chronic kidney disease stage IV. - Creatinine elevated to 5.02 with eGFR of 10 from a prior of 2.9 just 5 months ago.  Decreased oral hydration/use of spironolactone could be contributing.  UA positive for proteinuria.  Was empirically started on IV Rocephin.  Has had a history of renal stones but renal ultrasound without any hydronephrosis.  Continue IV fluids.    CT scan without any hydronephrosis.  Creatinine today at 5.0 without significant change.  Nephrology on board for further recommendation.  Possible UTI.  On IV Rocephin.  Urine culture with multiple species.  No leukocytosis or fever.  Urinalysis with many bacteria.  Abnormal CT chest.  Possible multifocal pneumonia as per CT scan report.  Patient does have some shortness of breath but no fever or cough or signs of pneumonia.   On Rocephin and Zithromax.  Back pain.  On  Tylenol as needed.   Could be secondary to renal stones/possible UTI, basal pneumonia.  Continue supportive care.  Essential hypertension Continue amlodipine and Coreg.  Hold spironolactone and clonidine for now.     Controlled type 2 diabetes mellitus - Last hemoglobin A1c of 5.9.  Sliding scale insulin.   Hyperlipidemia associated with type 2 diabetes mellitus Continue Lipitor and Zetia   Hypothyroidism Continue Synthroid   Severe obesity (BMI >= 40) Body mass index is 48.59 kg/m.  Would benefit from weight loss as outpatient.   OSA on CPAP -Continue CPAP      DVT prophylaxis: heparin injection 5,000 Units Start: 04/10/22 1400   Code Status:     Code Status: Full Code  Disposition:  Home likely in 1 to 2 days   Status is: Inpatient  Remains inpatient appropriate because: Progressive renal failure, nephrology evaluation, IV antibiotic   Family Communication: None at bedside  Consultants:  Nephrology  Procedures:  None  Antimicrobials:  Rocephin IV  Zithromax  Anti-infectives (From admission, onward)    Start     Dose/Rate Route Frequency Ordered Stop   04/11/22 1745  azithromycin (ZITHROMAX) tablet 500 mg        500 mg Oral Daily 04/11/22 1657 04/16/22 0959   04/09/22 2130  cefTRIAXone (ROCEPHIN) 1 g in sodium chloride 0.9 % 100 mL IVPB        1 g 200 mL/hr over 30 Minutes Intravenous Every 24 hours 04/09/22 2128        Subjective: Today, patient was  seen and examined at bedside.  Still complains of mild fatigue and back pain.    Objective: Vitals:   04/11/22 0749 04/11/22 1400 04/11/22 2300 04/12/22 0745  BP: 117/83 120/85 130/83 (!) 141/81  Pulse: 81 92  87  Resp: 19 17  18   Temp: 98.2 F (36.8 C) 98.8 F (37.1 C) 98.7 F (37.1 C) 98.9 F (37.2 C)  TempSrc: Oral Oral Oral Oral  SpO2: 96% 100%  95%  Weight:      Height:       No intake or output data in the 24 hours ending 04/12/22 1052  Filed Weights   04/09/22 1558 04/09/22 1604   Weight: 128.4 kg 128.4 kg    Physical Examination: Body mass index is 48.59 kg/m.   General: Obese built, not in obvious distress alert awake and Communicative HENT:   Mild pallor noted.  Oral mucosa is moist.  Chest:  Clear breath sounds.   No crackles or wheezes.  CVS: S1 &S2 heard. No murmur.  Regular rate and rhythm. Abdomen: Soft, nontender, nondistended.  Bowel sounds are heard.   Extremities: No cyanosis, clubbing or edema.  Peripheral pulses are palpable. Psych: Alert, awake and oriented, normal mood CNS:  No cranial nerve deficits.  Power equal in all extremities.   Skin: Warm and dry.  No rashes noted.  Data Reviewed:   CBC: Recent Labs  Lab 04/09/22 1608 04/11/22 0323 04/12/22 0218  WBC 9.7 6.9 7.5  HGB 12.4 10.7* 10.9*  HCT 34.7* 29.9* 30.3*  MCV 90.8 91.2 91.0  PLT 273 222 234     Basic Metabolic Panel: Recent Labs  Lab 04/09/22 1052 04/09/22 1608 04/10/22 0503 04/11/22 0323 04/12/22 0218  NA 133* 135 135 132* 132*  K 5.3* 5.1 4.7 4.5 4.7  CL 106 104 104 103 100  CO2 21 20* 18* 18* 19*  GLUCOSE 118* 100* 97 120* 104*  BUN 64* 67* 61* 62* 59*  CREATININE 5.19* 5.02* 4.92* 4.99* 5.00*  CALCIUM 9.4 10.1 9.0 8.6* 8.9  MG 2.0  --   --  2.2 2.1     Liver Function Tests: Recent Labs  Lab 04/09/22 1052 04/09/22 1608  AST 16 15  ALT 24 24  ALKPHOS 85 80  BILITOT 0.8 1.1  PROT 7.4 8.1  ALBUMIN 4.3 4.7      Radiology Studies: No results found.    LOS: 3 days    Joycelyn Das, MD [facility] Available via Epic secure chat 7am-7pm After these hours, please refer to coverage provider listed on [email or web address] 04/12/2022, 10:52 AM

## 2022-04-12 NOTE — Progress Notes (Signed)
[facility] Progress Note   52 y.o. female with HTN, HLD, DM, OSA, morbid obesity, recurrent nephrolithiasis, CKD4 presenting with bilateral lower back pain associated with increased urinary frequency for the past week. She has had fatigue and also difficulty going up stairs with more naps as well. Labs from her primary care doctor showed worsening renal function and she was sent to the ED. Patient has  had decreased PO intake with nausea but no vomiting; patient denies any diarrhea or NSAID use, only taking Tylenol for pain.    Urinalysis showed bacteria and the patient was started on Rocephin. CT scan showed 2-57mm nephrolith in the LK and also suspicious findings for a RMC/RLL PNA.   Renal ultrasound showed no evidence of obstruction but with possible nephroliathiasis.  Assessment/ Plan:   Acute of chronic kidney disease IIIb/IV: followed by Dr. Allena Katz with  a baseline creatinine which appears to be in the the 2.8-3 range last seen in the office 11/06/2021. Patient has had 3+ proteinuria but a non active sediment on microscopy.  Acute decline in renal function almost certainly associated with the infection with CT showing RML/RLL PNA; 2-3 mm nephrolith in LK not causing obstruction. She certainly could be in early ATN but fortunately no absolute indications for dialysis. Patient has had relatively rapid progression of renal disease even if the current elevation in BUN/Cr are acute. Her creatinine fluctuated in the 1.7-2.2 range from Jan 2023 through 07/05/21 and then when she saw Dr. Allena Katz in late Oct she was in the 2.8-3 range. Will check serologies and consider a renal biopsy for diagnosis and prognostication in the near future. 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 Patient is considered a prediabetic and has not had any retinopathy. - Will also check a UPC, microalbumin/cr ratio isn't correlating with the 3+ proteinuria; will  also check a SPEP and FLC.  - Supportive care at this time and a tincture of time; hopefully this is secondary to an ongoing infection and not progression of renal disease..   -Maintain MAP>65 for optimal renal perfusion.  - Avoid nephrotoxic agents such as IV contrast, NSAIDs, and phosphate containing bowel preps (FLEETS) - Preferred narcotic agents for pain control are hydromorphone, fentanyl, and methadone. Morphine should not be used.  - Avoid Baclofen and avoid oral sodium phosphate and magnesium citrate based laxatives / bowel preps.  - Continue strict Input and Output monitoring. Will monitor the patient closely with you and intervene or adjust therapy as indicated by changes in clinical status/labs     Back pain possibly from a UTI? In the setting of a pt with a strong h/o nephrolithiasis.  Hypertension: spironolactone and clonidine on hold now. Dm: long standing but no h/o retinopathy. Hypothyroidisim OSA Obesity  Subjective:   Feeling better; denies f/c/n/v/sob.   Objective:   BP (!) 141/81 (BP Location: Right Arm)   Pulse 87   Temp 98.9 F (37.2 C) (Oral)   Resp 18   Ht 5\' 4"  (1.626 m)   Wt 128.4 kg   SpO2 95%   BMI 48.59 kg/m   Intake/Output Summary (Last 24 hours) at 04/12/2022 1113 Last data filed at 04/12/2022 1113 Gross per 24 hour  Intake 200 ml  Output --  Net 200 ml   Weight change:   Physical Exam: General appearance: alert, cooperative Head: NCAT Back: symmetric, no CVA tenderness. Resp: clear to auscultation bilaterally, no rales Cardio: regular rate and  rhythm GI: soft, obese, NTND +BS Extremities: edema tr Pulses: 2+ and symmetric  Imaging: No results found.  Labs: BMET Recent Labs  Lab 04/09/22 1052 04/09/22 1608 04/10/22 0503 04/11/22 0323 04/12/22 0218  NA 133* 135 135 132* 132*  K 5.3* 5.1 4.7 4.5 4.7  CL 106 104 104 103 100  CO2 21 20* 18* 18* 19*  GLUCOSE 118* 100* 97 120* 104*  BUN 64* 67* 61* 62* 59*  CREATININE 5.19*  5.02* 4.92* 4.99* 5.00*  CALCIUM 9.4 10.1 9.0 8.6* 8.9   CBC Recent Labs  Lab 04/09/22 1608 04/11/22 0323 04/12/22 0218  WBC 9.7 6.9 7.5  HGB 12.4 10.7* 10.9*  HCT 34.7* 29.9* 30.3*  MCV 90.8 91.2 91.0  PLT 273 222 234    Medications:     amLODipine  10 mg Oral Daily   atorvastatin  80 mg Oral Daily   azithromycin  500 mg Oral Daily   carvedilol  25 mg Oral BID WC   docusate sodium  100 mg Oral BID   ezetimibe  10 mg Oral Daily   heparin  5,000 Units Subcutaneous Q8H   insulin aspart  0-6 Units Subcutaneous TID WC   levothyroxine  25 mcg Oral Daily   lidocaine  1 patch Transdermal Q24H   polyethylene glycol  17 g Oral Daily   traZODone  100 mg Oral QHS      Paulene Floor, MD 04/12/2022, 11:13 AM

## 2022-04-13 DIAGNOSIS — N179 Acute kidney failure, unspecified: Secondary | ICD-10-CM | POA: Diagnosis not present

## 2022-04-13 DIAGNOSIS — E1122 Type 2 diabetes mellitus with diabetic chronic kidney disease: Secondary | ICD-10-CM | POA: Diagnosis not present

## 2022-04-13 DIAGNOSIS — I10 Essential (primary) hypertension: Secondary | ICD-10-CM | POA: Diagnosis not present

## 2022-04-13 DIAGNOSIS — E1169 Type 2 diabetes mellitus with other specified complication: Secondary | ICD-10-CM | POA: Diagnosis not present

## 2022-04-13 LAB — BASIC METABOLIC PANEL
Anion gap: 13 (ref 5–15)
BUN: 58 mg/dL — ABNORMAL HIGH (ref 6–20)
CO2: 17 mmol/L — ABNORMAL LOW (ref 22–32)
Calcium: 9 mg/dL (ref 8.9–10.3)
Chloride: 103 mmol/L (ref 98–111)
Creatinine, Ser: 4.9 mg/dL — ABNORMAL HIGH (ref 0.44–1.00)
GFR, Estimated: 10 mL/min — ABNORMAL LOW (ref >60)
Glucose, Bld: 102 mg/dL — ABNORMAL HIGH (ref 70–99)
Potassium: 4.5 mmol/L (ref 3.5–5.1)
Sodium: 133 mmol/L — ABNORMAL LOW (ref 135–145)

## 2022-04-13 LAB — C3 COMPLEMENT: C3 Complement: 185 mg/dL — ABNORMAL HIGH (ref 82–167)

## 2022-04-13 LAB — ANTISTREPTOLYSIN O TITER: ASO: 146 IU/mL (ref 0.0–200.0)

## 2022-04-13 LAB — GLUCOSE, CAPILLARY
Glucose-Capillary: 109 mg/dL — ABNORMAL HIGH (ref 70–99)
Glucose-Capillary: 312 mg/dL — ABNORMAL HIGH (ref 70–99)
Glucose-Capillary: 86 mg/dL (ref 70–99)
Glucose-Capillary: 133 mg/dL — ABNORMAL HIGH (ref 70–99)

## 2022-04-13 LAB — C4 COMPLEMENT: Complement C4, Body Fluid: 95 mg/dL — ABNORMAL HIGH (ref 12–38)

## 2022-04-13 LAB — HIV ANTIBODY (ROUTINE TESTING W REFLEX): HIV Screen 4th Generation wRfx: NONREACTIVE

## 2022-04-13 NOTE — Progress Notes (Signed)
Pt has CPAP at bedside and declined assistance for placing it on to sleep. Pt knows to contact RT if she changes her mind.

## 2022-04-13 NOTE — Progress Notes (Signed)
Pt required no assistance with CPAP for bed, pt self sufficient with CPAP

## 2022-04-13 NOTE — Progress Notes (Signed)
PROGRESS NOTE    Robyn [name]  TRR:[id number] DOB: 1970/06/28 DOA: 04/09/2022 PCP: Philip Aspen, Limmie Patricia, MD    Brief Narrative:   Robyn [name] is a 52 y.o. female with medical history significant of hypertension, hyperlipidemia, type 2 diabetes, obstructive sleep apnea, CKD stage IV, morbid obesity presented to hospital with increased urinary frequency lower back pain and worsening creatinine levels.  She had gone to her primary care physician for the symptoms and had worsening renal failure so she was told to come to the ED.  Follows up with [facility] as outpatient.  In the ED, patient was hypertensive with blood pressure of  160/111 on room air. Creatinine was elevated to 5.02 with EGFR of 10 from a prior of 2.9 just 5 months ago.  Potassium was normal at 5.1, sodium of 135.  Urinalysis showed negative nitrite but proteinuria.  Renal ultrasound with no hydronephrosis but possible renal stones.  Nephrology was notified and patient was then considered for admission to the hospital for further evaluation and treatment  Assessment and Plan.  Acute on chronic kidney disease stage IV. Creatinine elevated on presentation 5.02 with eGFR of 10 from a prior of 2.9 just 5 months ago.  Decreased oral hydration/use of spironolactone could be contributing.  Urinalysis was positive for proteinuria.  Patient empirically started on IV Rocephin.  Has had a history of renal stones but renal ultrasound without any hydronephrosis.  Continue IV fluids.    CT scan without any hydronephrosis.  Creatinine today at 4.9 without significant change.  Nephrology on board for further recommendation.  Possibility of renal biopsy as per nephrology.  Possible UTI.  On IV Rocephin.  Urine culture with multiple species.  No leukocytosis or fever.  Urinalysis with many bacteria.  Abnormal CT chest.  Possible multifocal pneumonia as per CT scan report.  Patient does have some shortness of breath but no fever or  cough or signs of pneumonia.   On Rocephin and Zithromax, will continue to complete 5-day course..  Back pain.  On Tylenol as needed.   Could be secondary to renal stones/possible UTI, basal pneumonia.  Continue supportive care.  Will try to avoid opiates and NSAIDs.  Essential hypertension Continue amlodipine and Coreg.  Hold spironolactone and clonidine for now.     Controlled type 2 diabetes mellitus - Last hemoglobin A1c of 5.9.  Continue sliding scale insulin diabetic diet.  POC glucose of 109.   Hyperlipidemia associated with type 2 diabetes mellitus Continue Lipitor and Zetia   Hypothyroidism Continue Synthroid   Severe obesity (BMI >= 40) Body mass index is 48.59 kg/m.  Would benefit from weight loss as outpatient.   OSA on CPAP -Continue CPAP      DVT prophylaxis: heparin injection 5,000 Units Start: 04/10/22 1400   Code Status:     Code Status: Full Code  Disposition:  Home likely in 1 to 2 days   Status is: Inpatient  Remains inpatient appropriate because: Progressive renal failure, nephrology evaluation, IV antibiotic, possible need for kidney biopsy.   Family Communication: None at bedside  Consultants:  Nephrology  Procedures:  None  Antimicrobials:  Rocephin IV  Zithromax IV  Anti-infectives (From admission, onward)    Start     Dose/Rate Route Frequency Ordered Stop   04/11/22 1745  azithromycin (ZITHROMAX) tablet 500 mg        500 mg Oral Daily 04/11/22 1657 04/16/22 0959   04/09/22 2130  cefTRIAXone (ROCEPHIN) 1 g in sodium  chloride 0.9 % 100 mL IVPB        1 g 200 mL/hr over 30 Minutes Intravenous Every 24 hours 04/09/22 2128        Subjective: Today, patient was seen and examined at bedside.  Still complains of mild fatigue but denies much pain today.  No nausea or vomiting fever or chills.  Objective: Vitals:   04/12/22 0745 04/12/22 2000 04/13/22 0458 04/13/22 0731  BP: (!) 141/81 134/81 (!) 133/99   Pulse: 87 88 97   Resp: 18  20 14    Temp: 98.9 F (37.2 C) 98.5 F (36.9 C) 97.8 F (36.6 C) 98.2 F (36.8 C)  TempSrc: Oral Oral Oral Oral  SpO2: 95% 98% 97%   Weight:      Height:        Intake/Output Summary (Last 24 hours) at 04/13/2022 0935 Last data filed at 04/13/2022 0300 Gross per 24 hour  Intake 440 ml  Output 600 ml  Net -160 ml    Filed Weights   04/09/22 1558 04/09/22 1604  Weight: 128.4 kg 128.4 kg    Physical Examination: Body mass index is 48.59 kg/m.   General: Obese built, not in obvious distress not in obvious distress, alert awake and Communicative HENT:   Mild pallor noted.  Oral mucosa is moist.  Chest:  Clear breath sounds.   No crackles or wheezes.  CVS: S1 &S2 heard. No murmur.  Regular rate and rhythm. Abdomen: Soft, nontender, nondistended.  Bowel sounds are heard.   Extremities: No cyanosis, clubbing or edema.  Peripheral pulses are palpable. Psych: Alert, awake and oriented, normal mood CNS:  No cranial nerve deficits.  Power equal in all extremities.   Skin: Warm and dry.  No rashes noted.  Data Reviewed:   CBC: Recent Labs  Lab 04/09/22 1608 04/11/22 0323 04/12/22 0218  WBC 9.7 6.9 7.5  HGB 12.4 10.7* 10.9*  HCT 34.7* 29.9* 30.3*  MCV 90.8 91.2 91.0  PLT 273 222 234     Basic Metabolic Panel: Recent Labs  Lab 04/09/22 1052 04/09/22 1608 04/10/22 0503 04/11/22 0323 04/12/22 0218 04/13/22 0340  NA 133* 135 135 132* 132* 133*  K 5.3* 5.1 4.7 4.5 4.7 4.5  CL 106 104 104 103 100 103  CO2 21 20* 18* 18* 19* 17*  GLUCOSE 118* 100* 97 120* 104* 102*  BUN 64* 67* 61* 62* 59* 58*  CREATININE 5.19* 5.02* 4.92* 4.99* 5.00* 4.90*  CALCIUM 9.4 10.1 9.0 8.6* 8.9 9.0  MG 2.0  --   --  2.2 2.1  --      Liver Function Tests: Recent Labs  Lab 04/09/22 1052 04/09/22 1608  AST 16 15  ALT 24 24  ALKPHOS 85 80  BILITOT 0.8 1.1  PROT 7.4 8.1  ALBUMIN 4.3 4.7      Radiology Studies: No results found.    LOS: 4 days    Joycelyn Das, MD [facility] Available via Epic secure chat 7am-7pm After these hours, please refer to coverage provider listed on [email or web address] 04/13/2022, 9:35 AM

## 2022-04-13 NOTE — Progress Notes (Signed)
[facility] Progress Note   52 y.o. female with HTN, HLD, DM, OSA, morbid obesity, recurrent nephrolithiasis, CKD4 presenting with bilateral lower back pain associated with increased urinary frequency for the past week. She has had fatigue and also difficulty going up stairs with more naps as well. Labs from her primary care doctor showed worsening renal function and she was sent to the ED. Patient has  had decreased PO intake with nausea but no vomiting; patient denies any diarrhea or NSAID use, only taking Tylenol for pain.    Urinalysis showed bacteria and the patient was started on Rocephin. CT scan showed 2-1mm nephrolith in the LK and also suspicious findings for a RMC/RLL PNA.   Renal ultrasound showed no evidence of obstruction but with possible nephroliathiasis.  Assessment/ Plan:   Acute of chronic kidney disease IIIb/IV: followed by Dr. Allena Katz with  a baseline creatinine which appears to be in the the 2.8-3 range last seen in the office 11/06/2021. Patient has had 3+ proteinuria but a non active sediment on microscopy.  Acute decline in renal function almost certainly associated with the infection with CT showing RML/RLL PNA; 2-3 mm nephrolith in LK not causing obstruction. She certainly could be in early ATN but fortunately no absolute indications for dialysis. Patient has had relatively rapid progression of renal disease even if the current elevation in BUN/Cr are acute. Her creatinine fluctuated in the 1.7-2.2 range from Jan 2023 through 07/05/21 and then when she saw Dr. Allena Katz in late Oct she was in the 2.8-3 range.  - Will also check a UPC, microalbumin/cr ratio isn't correlating with the 3+ proteinuria; will also check a SPEP and FLC + full serologic w/u requested and Dr. Glenna Fellows will be covering starting Monday. I also d/w the patient and biopsy may be reasonable given fairly rapid progression (no labs from the beginning of Nov till the past week @ her PMD). Fortunately no  absolute indication for initiation of dialysis.  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 Patient is considered a prediabetic and has not had any retinopathy.   -Maintain MAP>65 for optimal renal perfusion.  - Avoid nephrotoxic agents such as IV contrast, NSAIDs, and phosphate containing bowel preps (FLEETS) - Preferred narcotic agents for pain control are hydromorphone, fentanyl, and methadone. Morphine should not be used.  - Avoid Baclofen and avoid oral sodium phosphate and magnesium citrate based laxatives / bowel preps.  - Continue strict Input and Output monitoring. Will monitor the patient closely with you and intervene or adjust therapy as indicated by changes in clinical status/labs     Back pain possibly from a UTI? In the setting of a pt with a strong h/o nephrolithiasis.  Hypertension: spironolactone and clonidine on hold now. Dm: long standing but no h/o retinopathy. Hypothyroidisim OSA Obesity  Subjective:   Feeling better but more fatigued today; denies f/c/n/v/sob.   Objective:   BP (!) 133/99 (BP Location: Right Arm)   Pulse 97   Temp 98.2 F (36.8 C) (Oral)   Resp 14   Ht 5\' 4"  (1.626 m)   Wt 128.4 kg   SpO2 97%   BMI 48.59 kg/m   Intake/Output Summary (Last 24 hours) at 04/13/2022 0915 Last data filed at 04/13/2022 0300 Gross per 24 hour  Intake 440 ml  Output 600 ml  Net -160 ml   Weight change:   Physical Exam: General appearance: alert, cooperative Head: NCAT Back:  symmetric, no CVA tenderness. Resp: clear to auscultation bilaterally, no rales Cardio: regular rate and rhythm GI: soft, obese, NTND +BS Extremities: edema tr Pulses: 2+ and symmetric  Imaging: No results found.  Labs: BMET Recent Labs  Lab 04/09/22 1052 04/09/22 1608 04/10/22 0503 04/11/22 0323 04/12/22 0218 04/13/22 0340  NA 133* 135 135 132* 132* 133*  K 5.3* 5.1 4.7 4.5 4.7 4.5  CL 106 104 104 103 100 103   CO2 21 20* 18* 18* 19* 17*  GLUCOSE 118* 100* 97 120* 104* 102*  BUN 64* 67* 61* 62* 59* 58*  CREATININE 5.19* 5.02* 4.92* 4.99* 5.00* 4.90*  CALCIUM 9.4 10.1 9.0 8.6* 8.9 9.0   CBC Recent Labs  Lab 04/09/22 1608 04/11/22 0323 04/12/22 0218  WBC 9.7 6.9 7.5  HGB 12.4 10.7* 10.9*  HCT 34.7* 29.9* 30.3*  MCV 90.8 91.2 91.0  PLT 273 222 234    Medications:     amLODipine  10 mg Oral Daily   atorvastatin  80 mg Oral Daily   azithromycin  500 mg Oral Daily   carvedilol  25 mg Oral BID WC   docusate sodium  100 mg Oral BID   ezetimibe  10 mg Oral Daily   heparin  5,000 Units Subcutaneous Q8H   insulin aspart  0-6 Units Subcutaneous TID WC   levothyroxine  25 mcg Oral Daily   lidocaine  1 patch Transdermal Q24H   polyethylene glycol  17 g Oral Daily   traZODone  100 mg Oral QHS      Paulene Floor, MD 04/13/2022, 9:15 AM

## 2022-04-14 DIAGNOSIS — E039 Hypothyroidism, unspecified: Secondary | ICD-10-CM | POA: Diagnosis not present

## 2022-04-14 DIAGNOSIS — E1122 Type 2 diabetes mellitus with diabetic chronic kidney disease: Secondary | ICD-10-CM | POA: Diagnosis not present

## 2022-04-14 DIAGNOSIS — I10 Essential (primary) hypertension: Secondary | ICD-10-CM | POA: Diagnosis not present

## 2022-04-14 DIAGNOSIS — N179 Acute kidney failure, unspecified: Secondary | ICD-10-CM | POA: Diagnosis not present

## 2022-04-14 LAB — GLUCOSE, CAPILLARY
Glucose-Capillary: 142 mg/dL — ABNORMAL HIGH (ref 70–99)
Glucose-Capillary: 183 mg/dL — ABNORMAL HIGH (ref 70–99)
Glucose-Capillary: 144 mg/dL — ABNORMAL HIGH (ref 70–99)
Glucose-Capillary: 137 mg/dL — ABNORMAL HIGH (ref 70–99)

## 2022-04-14 LAB — BASIC METABOLIC PANEL
Anion gap: 10 (ref 5–15)
BUN: 58 mg/dL — ABNORMAL HIGH (ref 6–20)
CO2: 18 mmol/L — ABNORMAL LOW (ref 22–32)
Calcium: 8.7 mg/dL — ABNORMAL LOW (ref 8.9–10.3)
Chloride: 105 mmol/L (ref 98–111)
Creatinine, Ser: 5.1 mg/dL — ABNORMAL HIGH (ref 0.44–1.00)
GFR, Estimated: 10 mL/min — ABNORMAL LOW (ref >60)
Glucose, Bld: 108 mg/dL — ABNORMAL HIGH (ref 70–99)
Potassium: 4.6 mmol/L (ref 3.5–5.1)
Sodium: 133 mmol/L — ABNORMAL LOW (ref 135–145)

## 2022-04-14 LAB — CBC
HCT: 30.6 % — ABNORMAL LOW (ref 36.0–46.0)
Hemoglobin: 11 g/dL — ABNORMAL LOW (ref 12.0–15.0)
MCH: 32.2 pg (ref 26.0–34.0)
MCHC: 35.9 g/dL (ref 30.0–36.0)
MCV: 89.5 fL (ref 80.0–100.0)
Platelets: 232 10*3/uL (ref 150–400)
RBC: 3.42 MIL/uL — ABNORMAL LOW (ref 3.87–5.11)
RDW: 12.9 % (ref 11.5–15.5)
WBC: 8.1 10*3/uL (ref 4.0–10.5)
nRBC: 0 % (ref 0.0–0.2)

## 2022-04-14 LAB — PROTEIN / CREATININE RATIO, URINE
Creatinine, Urine: 154 mg/dL
Protein Creatinine Ratio: 3.27 mg/mg{Cre} — ABNORMAL HIGH (ref 0.00–0.15)
Total Protein, Urine: 503 mg/dL

## 2022-04-14 LAB — KAPPA/LAMBDA LIGHT CHAINS
Kappa free light chain: 78.2 mg/L — ABNORMAL HIGH (ref 3.3–19.4)
Kappa, lambda light chain ratio: 1.81 — ABNORMAL HIGH (ref 0.26–1.65)
Lambda free light chains: 43.3 mg/L — ABNORMAL HIGH (ref 5.7–26.3)

## 2022-04-14 LAB — ANA W/REFLEX IF POSITIVE: Anti Nuclear Antibody (ANA): NEGATIVE

## 2022-04-14 MED ORDER — CLONIDINE HCL 0.1 MG PO TABS
0.1000 mg | ORAL_TABLET | Freq: Two times a day (BID) | ORAL | Status: DC
  Administered 2022-04-14 – 2022-04-15 (×3): 0.1 mg via ORAL
  Filled 2022-04-14 (×3): qty 1

## 2022-04-14 NOTE — Progress Notes (Signed)
Pt is self sufficient with CPAP machine at bedtime and needs no assistance.

## 2022-04-14 NOTE — Progress Notes (Signed)
PROGRESS NOTE    APONI GROOMES  HQI:[id number] DOB: 1970-08-14 DOA: 04/09/2022 PCP: Philip Aspen, Limmie Patricia, MD    Brief Narrative:   Robyn [name] is a 52 y.o. female with medical history significant of hypertension, hyperlipidemia, type 2 diabetes, obstructive sleep apnea, CKD stage IV, morbid obesity presented to hospital with increased urinary frequency lower back pain and worsening creatinine levels.  She had gone to her primary care physician for the symptoms and had worsening renal failure so she was told to come to the ED.  Follows up with [facility] as outpatient.  In the ED, patient was hypertensive with blood pressure of  160/111 on room air. Creatinine was elevated to 5.02 with EGFR of 10 from a prior of 2.9 just 5 months ago.  Potassium was normal at 5.1, sodium of 135.  Urinalysis showed negative nitrite but proteinuria.  Renal ultrasound with no hydronephrosis but possible renal stones.  Nephrology was notified and patient was then considered for admission to the hospital for further evaluation and treatment  Assessment and Plan.  Acute on chronic kidney disease stage IV. Creatinine elevated on presentation 5.02 with eGFR of 10 from a prior of 2.9 just 5 months ago.  Creatinine today at 5.1 without much improvement.  Nephrology on board and possibility of infection related.  At this time nephrology wanting to monitor closely and if remains stable possible disposition home with outpatient follow-up.  On empiric IV Rocephin and Zithromax for possible pneumonia and UTI though not much symptoms present at this time.  Urinalysis was positive for proteinuria.  Has had a history of renal stones but renal ultrasound without any hydronephrosis.  Patient had initially received IV fluids.  CT scan without any hydronephrosis.     Possible UTI.  On IV Rocephin.  Urine culture with multiple species.  No leukocytosis or fever.  Urinalysis with many bacteria.  Will continue to complete  3-day course.  Abnormal CT chest.  Possible multifocal pneumonia as per CT scan report.  Patient does have some shortness of breath but no fever or cough or signs of pneumonia.   On Rocephin and Zithromax, will continue to complete 5-day course..  Back pain/spasm.  Paravertebral muscle spasm noted.  On Tylenol as needed.   .  Continue supportive care.  Will try to avoid opiates and NSAIDs.  Offered muscle relaxant but wishes to hold off.  Continue warm compression.  Essential hypertension Continue amlodipine and Coreg.  Hold spironolactone and clonidine for now.  Blood pressure elevated today.  Will restart clonidine.   Controlled type 2 diabetes mellitus - Last hemoglobin A1c of 5.9.  Continue sliding scale insulin diabetic diet.  POC glucose of 142.   Hyperlipidemia associated with type 2 diabetes mellitus Continue Lipitor and Zetia   Hypothyroidism Continue Synthroid   Severe obesity (BMI >= 40) Body mass index is 48.59 kg/m.  Would benefit from weight loss as outpatient.   OSA on CPAP -Continue CPAP      DVT prophylaxis: heparin injection 5,000 Units Start: 04/10/22 1400   Code Status:     Code Status: Full Code  Disposition:  Home likely in 1 to 2 days with nephrology.  Status is: Inpatient  Remains inpatient appropriate because: Progressive renal failure, nephrology evaluation, IV antibiotic,   Family Communication: None at bedside  Consultants:  Nephrology  Procedures:  None  Antimicrobials:  Rocephin IV  Zithromax IV  Anti-infectives (From admission, onward)    Start  Dose/Rate Route Frequency Ordered Stop   04/11/22 1745  azithromycin (ZITHROMAX) tablet 500 mg        500 mg Oral Daily 04/11/22 1657 04/16/22 0959   04/09/22 2130  cefTRIAXone (ROCEPHIN) 1 g in sodium chloride 0.9 % 100 mL IVPB        1 g 200 mL/hr over 30 Minutes Intravenous Every 24 hours 04/09/22 2128        Subjective: Today, patient was seen and examined at bedside.   Complains of mild back spasms and fatigue.  Had some 2 loose stools yesterday.  Objective: Vitals:   04/13/22 0900 04/13/22 1700 04/13/22 2112 04/14/22 0906  BP:  115/84 129/79 (!) 133/105  Pulse: (!) 103  87 97  Resp: 19  18 18   Temp:   98.4 F (36.9 C) 98 F (36.7 C)  TempSrc:   Oral Oral  SpO2: 98%  98% 99%  Weight:      Height:        Intake/Output Summary (Last 24 hours) at 04/14/2022 1027 Last data filed at 04/13/2022 1500 Gross per 24 hour  Intake 240 ml  Output --  Net 240 ml    Filed Weights   04/09/22 1558 04/09/22 1604  Weight: 128.4 kg 128.4 kg    Physical Examination: Body mass index is 48.59 kg/m.   General: Morbidly obese not in obvious distress not in obvious distress, alert awake and Communicative HENT:   Mild pallor noted.  Oral mucosa is moist.  Chest:  Clear breath sounds.   No crackles or wheezes.  CVS: S1 &S2 heard. No murmur.  Regular rate and rhythm. Abdomen: Soft, nontender, nondistended.  Bowel sounds are heard.  Mild left paravertebral spasm. Extremities: No cyanosis, clubbing or edema.  Peripheral pulses are palpable. Psych: Alert, awake and oriented, normal mood CNS:  No cranial nerve deficits.  Power equal in all extremities.   Skin: Warm and dry.  No rashes noted.  Data Reviewed:   CBC: Recent Labs  Lab 04/09/22 1608 04/11/22 0323 04/12/22 0218 04/14/22 0255  WBC 9.7 6.9 7.5 8.1  HGB 12.4 10.7* 10.9* 11.0*  HCT 34.7* 29.9* 30.3* 30.6*  MCV 90.8 91.2 91.0 89.5  PLT 273 222 234 232     Basic Metabolic Panel: Recent Labs  Lab 04/09/22 1052 04/09/22 1608 04/10/22 0503 04/11/22 0323 04/12/22 0218 04/13/22 0340 04/14/22 0255  NA 133*   < > 135 132* 132* 133* 133*  K 5.3*   < > 4.7 4.5 4.7 4.5 4.6  CL 106   < > 104 103 100 103 105  CO2 21   < > 18* 18* 19* 17* 18*  GLUCOSE 118*   < > 97 120* 104* 102* 108*  BUN 64*   < > 61* 62* 59* 58* 58*  CREATININE 5.19*   < > 4.92* 4.99* 5.00* 4.90* 5.10*  CALCIUM 9.4   < > 9.0  8.6* 8.9 9.0 8.7*  MG 2.0  --   --  2.2 2.1  --   --    < > = values in this interval not displayed.     Liver Function Tests: Recent Labs  Lab 04/09/22 1052 04/09/22 1608  AST 16 15  ALT 24 24  ALKPHOS 85 80  BILITOT 0.8 1.1  PROT 7.4 8.1  ALBUMIN 4.3 4.7      Radiology Studies: No results found.    LOS: 5 days    Joycelyn Das, MD [facility] Available via Epic secure chat  7am-7pm After these hours, please refer to coverage provider listed on [email or web address] 04/14/2022, 10:27 AM

## 2022-04-14 NOTE — Plan of Care (Signed)
  Problem: Education: Goal: Knowledge of General Education information will improve Description: Including pain rating scale, medication(s)/side effects and non-pharmacologic comfort measures Outcome: Progressing   Problem: Health Behavior/Discharge Planning: Goal: Ability to manage health-related needs will improve Outcome: Progressing   Problem: Clinical Measurements: Goal: Ability to maintain clinical measurements within normal limits will improve Outcome: Progressing   Problem: Activity: Goal: Risk for activity intolerance will decrease Outcome: Progressing   Problem: Coping: Goal: Level of anxiety will decrease Outcome: Progressing   Problem: Elimination: Goal: Will not experience complications related to bowel motility Outcome: Progressing   Problem: Safety: Goal: Ability to remain free from injury will improve Outcome: Progressing

## 2022-04-14 NOTE — Progress Notes (Signed)
[facility] Progress Note   52 y.o. female with HTN, HLD, DM, OSA, morbid obesity, recurrent nephrolithiasis, CKD4 presenting with bilateral lower back pain associated with increased urinary frequency for the past week. She has had fatigue and also difficulty going up stairs with more naps as well. Labs from her primary care doctor showed worsening renal function and she was sent to the ED. Patient has  had decreased PO intake with nausea but no vomiting; patient denies any diarrhea or NSAID use, only taking Tylenol for pain.    Urinalysis showed bacteria and the patient was started on Rocephin. CT scan showed 2-63mm nephrolith in the LK and also suspicious findings for a RMC/RLL PNA.   Renal ultrasound showed no evidence of obstruction but with possible nephroliathiasis.  Assessment/ Plan:   Acute of chronic kidney disease IIIb/IV: followed by Dr. Allena Katz with  a baseline creatinine which appears to be in the the 2.8-3 range last seen in the office 11/06/2021. Patient has had 3+ proteinuria but a non active sediment on microscopy.  Acute decline in renal function almost certainly associated with the infection with CT showing RML/RLL PNA; 2-3 mm nephrolith in LK not causing obstruction. She certainly could have some ATN.   Serologies are pending currently but I have a low suspicion for any active GN.  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  We discussed holding on biopsy unless any +serologies as I believe it will be low yield and not change management.  Kidney function is currently low but stable with no indications for dialysis.  If she remains stable tomorrow with acceptable UOP I think d/c with close f/u to evaluate for recovery is very reasonable.   -Maintain MAP>65 for optimal renal perfusion.  - Avoid nephrotoxic agents such as IV contrast, NSAIDs, and phosphate containing bowel preps (FLEETS) - Preferred narcotic agents for pain  control are hydromorphone, fentanyl, and methadone. Morphine should not be used.  - Avoid Baclofen and avoid oral sodium phosphate and magnesium citrate based laxatives / bowel preps.  - Continue strict Input and Output monitoring. Will monitor the patient closely with you and intervene or adjust therapy as indicated by changes in clinical status/labs     Back pain possibly from a UTI? In the setting of a pt with a strong h/o nephrolithiasis.  Hypertension: spironolactone and clonidine on hold now in setting of AKI.  BP tolerable.  Dm: long standing but no h/o retinopathy. Hypothyroidisim OSA Obesity  Subjective:   Feeling fine today; denies f/c/n/v/sob. UOP and weight not recorded - says UOP is normal.   Daughter bedside.    Objective:   BP (!) 133/105 (BP Location: Right Arm)   Pulse 97   Temp 98 F (36.7 C) (Oral)   Resp 18   Ht 5\' 4"  (1.626 m)   Wt 128.4 kg   SpO2 99%   BMI 48.59 kg/m   Intake/Output Summary (Last 24 hours) at 04/14/2022 0920 Last data filed at 04/13/2022 1500 Gross per 24 hour  Intake 360 ml  Output --  Net 360 ml    Weight change:   Physical Exam: General appearance: alert, cooperative  Head: NCAT Back: symmetric, no CVA tenderness. Resp: clear to auscultation bilaterally, no rales Cardio: regular rate and rhythm GI: soft, obese, NTND +BS Extremities: no edema Pulses: 2+ and symmetric  Imaging: No results found.  Labs: BMET Recent Labs  Lab 04/09/22 1052 04/09/22 1608  04/10/22 0503 04/11/22 0323 04/12/22 0218 04/13/22 0340 04/14/22 0255  NA 133* 135 135 132* 132* 133* 133*  K 5.3* 5.1 4.7 4.5 4.7 4.5 4.6  CL 106 104 104 103 100 103 105  CO2 21 20* 18* 18* 19* 17* 18*  GLUCOSE 118* 100* 97 120* 104* 102* 108*  BUN 64* 67* 61* 62* 59* 58* 58*  CREATININE 5.19* 5.02* 4.92* 4.99* 5.00* 4.90* 5.10*  CALCIUM 9.4 10.1 9.0 8.6* 8.9 9.0 8.7*    CBC Recent Labs  Lab 04/09/22 1608 04/11/22 0323 04/12/22 0218 04/14/22 0255  WBC  9.7 6.9 7.5 8.1  HGB 12.4 10.7* 10.9* 11.0*  HCT 34.7* 29.9* 30.3* 30.6*  MCV 90.8 91.2 91.0 89.5  PLT 273 222 234 232     Medications:     amLODipine  10 mg Oral Daily   atorvastatin  80 mg Oral Daily   azithromycin  500 mg Oral Daily   carvedilol  25 mg Oral BID WC   docusate sodium  100 mg Oral BID   ezetimibe  10 mg Oral Daily   heparin  5,000 Units Subcutaneous Q8H   insulin aspart  0-6 Units Subcutaneous TID WC   levothyroxine  25 mcg Oral Daily   lidocaine  1 patch Transdermal Q24H   polyethylene glycol  17 g Oral Daily   traZODone  100 mg Oral QHS     Estill Bakes MD [facility] Pager [phone]

## 2022-04-15 DIAGNOSIS — E1169 Type 2 diabetes mellitus with other specified complication: Secondary | ICD-10-CM | POA: Diagnosis not present

## 2022-04-15 DIAGNOSIS — E1122 Type 2 diabetes mellitus with diabetic chronic kidney disease: Secondary | ICD-10-CM | POA: Diagnosis not present

## 2022-04-15 DIAGNOSIS — I10 Essential (primary) hypertension: Secondary | ICD-10-CM | POA: Diagnosis not present

## 2022-04-15 DIAGNOSIS — N179 Acute kidney failure, unspecified: Secondary | ICD-10-CM | POA: Diagnosis not present

## 2022-04-15 LAB — ANCA PROFILE
Anti-MPO Antibodies: <0.2 units (ref 0.0–0.9)
Anti-PR3 Antibodies: <0.2 units (ref 0.0–0.9)
Atypical P-ANCA titer: <1:20 {titer}
C-ANCA: <1:20 {titer}
P-ANCA: <1:20 {titer}

## 2022-04-15 LAB — RENAL FUNCTION PANEL
Albumin: 3.2 g/dL — ABNORMAL LOW (ref 3.5–5.0)
Anion gap: 8 (ref 5–15)
BUN: 62 mg/dL — ABNORMAL HIGH (ref 6–20)
CO2: 19 mmol/L — ABNORMAL LOW (ref 22–32)
Calcium: 8.6 mg/dL — ABNORMAL LOW (ref 8.9–10.3)
Chloride: 105 mmol/L (ref 98–111)
Creatinine, Ser: 4.98 mg/dL — ABNORMAL HIGH (ref 0.44–1.00)
GFR, Estimated: 10 mL/min — ABNORMAL LOW (ref >60)
Glucose, Bld: 118 mg/dL — ABNORMAL HIGH (ref 70–99)
Phosphorus: 7.3 mg/dL — ABNORMAL HIGH (ref 2.5–4.6)
Potassium: 4.4 mmol/L (ref 3.5–5.1)
Sodium: 132 mmol/L — ABNORMAL LOW (ref 135–145)

## 2022-04-15 LAB — GLOMERULAR BASEMENT MEMBRANE ANTIBODIES: GBM Ab: <0.2 units (ref 0.0–0.9)

## 2022-04-15 LAB — GLUCOSE, CAPILLARY: Glucose-Capillary: 100 mg/dL — ABNORMAL HIGH (ref 70–99)

## 2022-04-15 MED ORDER — DOCUSATE SODIUM 100 MG PO CAPS: 100.0000 mg | ORAL_CAPSULE | Freq: Every day | ORAL | 0 refills | Status: AC | PRN

## 2022-04-15 MED ORDER — METHYLPREDNISOLONE SODIUM SUCC 40 MG IJ SOLR
40.0000 mg | Freq: Once | INTRAMUSCULAR | Status: AC
  Administered 2022-04-15: 40 mg via INTRAVENOUS
  Filled 2022-04-15: qty 1

## 2022-04-15 MED ORDER — PREDNISONE 10 MG PO TABS: 20.0000 mg | ORAL_TABLET | Freq: Every day | ORAL | 0 refills | Status: AC

## 2022-04-15 MED ORDER — LIDOCAINE 5 % EX PTCH: 1.0000 | MEDICATED_PATCH | CUTANEOUS | 0 refills | Status: AC

## 2022-04-15 NOTE — Discharge Summary (Signed)
Physician Discharge Summary  Robyn [name] ZOX:[id number] DOB: [birth year] DOA: 04/09/2022  PCP: Philip Aspen, Limmie Patricia, MD  Admit date: 04/09/2022 Discharge date: 04/15/2022  Admitted From: Home  Discharge disposition: Home  Recommendations for Outpatient Follow-Up:   Follow-up with [facility] as outpatient within 1 week.  Office to schedule an appointment.   Follow up with your primary care provider as scheduled by the clinic in 2 to 3 weeks. Check CBC, BMP, magnesium in the next visit  Discharge Diagnosis:   Principal Problem:   Acute on chronic kidney failure Active Problems:   Hypertension   OSA on CPAP   Severe obesity (BMI >= 40)   Hypothyroidism   Hyperlipidemia associated with type 2 diabetes mellitus   Controlled type 2 diabetes mellitus  Discharge Condition: Improved.  Diet recommendation: Low sodium, heart healthy.  Carbohydrate-modified.   Wound care: None.  Code status: Full.   History of Present Illness:   ANDREE HEEG is a 52 y.o. female with medical history significant of hypertension, hyperlipidemia, type 2 diabetes, obstructive sleep apnea, CKD stage IV, morbid obesity presented to hospital with increased urinary frequency lower back pain and worsening creatinine levels.  She had gone to her primary care physician for the symptoms and had worsening renal failure so she was told to come to the ED. Patient follows up with  kidney, Dr. Allena Katz as outpatient.  In the ED, patient was hypertensive with blood pressure of  160/111 on room air. Creatinine was elevated to 5.02 with EGFR of 10 from a prior of 2.9 just 5 months ago.  Potassium was normal at 5.1, sodium of 135.  Urinalysis showed negative nitrite but proteinuria.  Renal ultrasound with no hydronephrosis but possible renal stones.  Nephrology was notified and patient was then considered for admission to the hospital for further evaluation and treatment    Hospital Course:    Following conditions were addressed during hospitalization as listed below,  Acute on chronic kidney disease stage IV. Creatinine elevated on presentation 5.02 with eGFR of 10 from a prior of 2.9 just 5 months ago.  Creatinine today at 5.1 without much improvement.  Nephrology consulted and patient was followed during hospitalization.  Renal workup was appreciated.  Patient was empirically treated with IV Rocephin and Zithromax for possible pneumonia and UTI.    Urinalysis was positive for proteinuria.  Has had a history of renal stones but renal ultrasound without any hydronephrosis.  Patient had initially received IV fluids.  CT scan without any hydronephrosis.   At this time nephrology has recommended outpatient follow-up closely and did not recommend any need for biopsy.  Left ankle gouty flare.  Due to AKI we will hold off with colchicine.  Short course prednisone on discharge.  Spoke with nephrology.  Okay to continue current dose of allopurinol.   Possible UTI.  On IV Rocephin.  Urine culture with multiple species.  No leukocytosis or fever.  Urinalysis with many bacteria.    Abnormal CT chest.  Possible multifocal pneumonia as per CT scan report.  Patient did have some shortness of breath but no fever or cough or signs of pneumonia.  Patient has completed Rocephin and Zithromax.   Back pain/spasm.  Paravertebral muscle spasm noted.  On Tylenol as needed.   .  Advised against opiates and NSAIDs.  Offered muscle relaxant but wishes to hold off.  Continue warm compression on discharge.   Essential hypertension Continue amlodipine and Coreg.  Hold spironolactone and  clonidine on discharge.  Spoke with nephrology regarding   Controlled type 2 diabetes mellitus - Last hemoglobin A1c of 5.9.    Hyperlipidemia  Continue Lipitor and Zetia   Hypothyroidism Continue Synthroid   Severe obesity (BMI >= 40) Body mass index is 48.59 kg/m.  Would benefit from weight loss as outpatient.   OSA  on CPAP -Continue CPAP  Disposition.  At this time, patient is stable for disposition home with outpatient PCP and nephrology follow-up  Medical Consultants:   Nephrology  Procedures:    None Subjective:   Today, patient was seen and examined at bedside.  Complains about left ankle gouty pain.  Discharge Exam:   Vitals:   04/15/22 0434 04/15/22 0732  BP: 121/84 109/74  Pulse: 73 83  Resp: 17 17  Temp: 98 F (36.7 C) 98.6 F (37 C)  SpO2: 99% 100%   Vitals:   04/14/22 1729 04/14/22 2019 04/15/22 0434 04/15/22 0732  BP: 117/79 (!) 130/93 121/84 109/74  Pulse:  74 73 83  Resp:  16 17 17   Temp:  97.9 F (36.6 C) 98 F (36.7 C) 98.6 F (37 C)  TempSrc:  Oral  Oral  SpO2:  99% 99% 100%  Weight:      Height:      Body mass index is 48.59 kg/m.   General: Alert awake, not in obvious distress, morbidly obese HENT: pupils equally reacting to light, mild pallor noted.  Oral mucosa is moist.  Chest:  Clear breath sounds.  . No crackles or wheezes.  CVS: S1 &S2 heard. No murmur.  Regular rate and rhythm. Abdomen: Soft, nontender, nondistended.  Bowel sounds are heard.   Extremities: No cyanosis, clubbing or edema.  Peripheral pulses are palpable. Psych: Alert, awake and oriented, normal mood CNS:  No cranial nerve deficits.  Power equal in all extremities.   Skin: Warm and dry.  No rashes noted.  The results of significant diagnostics from this hospitalization (including imaging, microbiology, ancillary and laboratory) are listed below for reference.     Diagnostic Studies:   CT RENAL STONE STUDY  Result Date: 04/10/2022 CLINICAL DATA:  Right flank pain EXAM: CT ABDOMEN AND PELVIS WITHOUT CONTRAST TECHNIQUE: Multidetector CT imaging of the abdomen and pelvis was performed following the standard protocol without IV contrast. RADIATION DOSE REDUCTION: This exam was performed according to the departmental dose-optimization program which includes automated exposure  control, adjustment of the mA and/or kV according to patient size and/or use of iterative reconstruction technique. COMPARISON:  12/29/2011 FINDINGS: Lower chest: Multifocal patchy airspace consolidations within the periphery of the right middle lobe and right lower lobe. Linear left basilar atelectasis. Heart size within normal limits. Aortic and coronary artery atherosclerosis. Hepatobiliary: No focal liver abnormality is seen. Status post cholecystectomy. No biliary dilatation. Pancreas: Unremarkable. No pancreatic ductal dilatation or surrounding inflammatory changes. Spleen: Normal in size without focal abnormality. Adrenals/Urinary Tract: 1.4 cm right adrenal gland nodule with internal density of 28 HU, stable in size since 2013 and benign. Slight thickening of the left adrenal gland without discrete nodule. Punctate 2-3 mm nonobstructing stones within the upper pole of the left kidney. No right-sided renal stone. No hydronephrosis of either kidney. No ureteral calculi identified. Urinary bladder within normal limits. Stomach/Bowel: Tiny hiatal hernia. Stomach is otherwise within normal limits. Appendix appears normal. No evidence of bowel wall thickening, distention, or inflammatory changes. Vascular/Lymphatic: Scattered aortoiliac atherosclerotic calcifications without aneurysm. No abdominopelvic lymphadenopathy. Reproductive: Uterus and bilateral adnexa are unremarkable. Other: No  free fluid. No abdominopelvic fluid collection. No pneumoperitoneum. No abdominal wall hernia. Musculoskeletal: No acute or significant osseous findings. Appearance of the sacroiliac joints compatible with sequela of sacroiliitis. IMPRESSION: 1. Multifocal patchy airspace consolidations within the periphery of the right middle lobe and right lower lobe, suspicious for multifocal pneumonia. 2. Punctate nonobstructing left renal stones.  No hydronephrosis. 3. Aortic atherosclerosis (ICD10-I70.0). Electronically Signed   By: Duanne Guess D.O.   On: 04/10/2022 09:07   DG Chest Portable 1 View  Result Date: 04/09/2022 CLINICAL DATA:  Shortness of breath EXAM: PORTABLE CHEST 1 VIEW COMPARISON:  X-ray 07/25/2012 FINDINGS: Is some linear opacity at the right lung base likely scar or atelectasis. Normal cardiopericardial silhouette. No consolidation, pneumothorax or effusion. No edema. Overlapping cardiac leads. Degenerative changes along the spine with slight curvature. IMPRESSION: Mild right basilar atelectasis.  No consolidation. Electronically Signed   By: Karen Kays M.D.   On: 04/09/2022 19:47   US Renal  Result Date: 04/09/2022 CLINICAL DATA:  Renal dysfunction EXAM: RENAL / URINARY TRACT ULTRASOUND COMPLETE COMPARISON:  CT done on 12/29/2011 FINDINGS: Examination was technically difficult due to patient's body habitus. Right Kidney: Renal measurements: 9.9 x 5.2 x 5 cm = volume: 134.3 mL. There is increased cortical echogenicity suggesting medical renal disease. There is 6 mm hyperechoic focus in the midportion of right kidney, possibly renal stone or calcification in the renal cortex. There is 1.2 cm anechoic structure in right kidney. Focal bulge in the lateral upper pole of right kidney seen in the previous CT could not be distinctly visualized in the submitted images. Left Kidney: Renal measurements: 10.3 x 5.1 x 6.5 cm = volume: 179.1 mL. There is no hydronephrosis. There is increased cortical echogenicity. There is 10 mm echogenic focus in the midportion of left kidney, possibly a calculus or calcification in the renal cortex. Bladder: Appears normal for degree of bladder distention. Other: None. IMPRESSION: There is no hydronephrosis. There is increased cortical echogenicity in both kidneys suggesting medical renal disease. There are small hyperechoic foci in both kidneys suggesting possible renal stones. There is possible 1.2 cm cyst in the right kidney. Electronically Signed   By: Ernie Avena M.D.   On: 04/09/2022  19:24     Labs:   Basic Metabolic Panel: Recent Labs  Lab 04/09/22 1052 04/09/22 1608 04/11/22 0323 04/12/22 0218 04/13/22 0340 04/14/22 0255 04/15/22 0326  NA 133*   < > 132* 132* 133* 133* 132*  K 5.3*   < > 4.5 4.7 4.5 4.6 4.4  CL 106   < > 103 100 103 105 105  CO2 21   < > 18* 19* 17* 18* 19*  GLUCOSE 118*   < > 120* 104* 102* 108* 118*  BUN 64*   < > 62* 59* 58* 58* 62*  CREATININE 5.19*   < > 4.99* 5.00* 4.90* 5.10* 4.98*  CALCIUM 9.4   < > 8.6* 8.9 9.0 8.7* 8.6*  MG 2.0  --  2.2 2.1  --   --   --   PHOS  --   --   --   --   --   --  7.3*   < > = values in this interval not displayed.   GFR Estimated Creatinine Clearance: 18 mL/min (A) (by C-G formula based on SCr of 4.98 mg/dL (H)). Liver Function Tests: Recent Labs  Lab 04/09/22 1052 04/09/22 1608 04/15/22 0326  AST 16 15  --   ALT 24 24  --  ALKPHOS 85 80  --   BILITOT 0.8 1.1  --   PROT 7.4 8.1  --   ALBUMIN 4.3 4.7 3.2*   No results for input(s): "LIPASE", "AMYLASE" in the last 168 hours. No results for input(s): "AMMONIA" in the last 168 hours. Coagulation profile No results for input(s): "INR", "PROTIME" in the last 168 hours.  CBC: Recent Labs  Lab 04/09/22 1608 04/11/22 0323 04/12/22 0218 04/14/22 0255  WBC 9.7 6.9 7.5 8.1  HGB 12.4 10.7* 10.9* 11.0*  HCT 34.7* 29.9* 30.3* 30.6*  MCV 90.8 91.2 91.0 89.5  PLT 273 222 234 232   Cardiac Enzymes: No results for input(s): "CKTOTAL", "CKMB", "CKMBINDEX", "TROPONINI" in the last 168 hours. BNP: Invalid input(s): "POCBNP" CBG: Recent Labs  Lab 04/14/22 0908 04/14/22 1144 04/14/22 1554 04/14/22 2020 04/15/22 0728  GLUCAP 142* 183* 144* 137* 100*   D-Dimer No results for input(s): "DDIMER" in the last 72 hours. Hgb A1c No results for input(s): "HGBA1C" in the last 72 hours. Lipid Profile No results for input(s): "CHOL", "HDL", "LDLCALC", "TRIG", "CHOLHDL", "LDLDIRECT" in the last 72 hours. Thyroid function studies No results for  input(s): "TSH", "T4TOTAL", "T3FREE", "THYROIDAB" in the last 72 hours.  Invalid input(s): "FREET3" Anemia work up No results for input(s): "VITAMINB12", "FOLATE", "FERRITIN", "TIBC", "IRON", "RETICCTPCT" in the last 72 hours. Microbiology Recent Results (from the past 240 hour(s))  Urine Culture (for pregnant, neutropenic or urologic patients or patients with an indwelling urinary catheter)     Status: Abnormal   Collection Time: 04/10/22  1:04 AM   Specimen: Urine, Clean Catch  Result Value Ref Range Status   Specimen Description URINE, CLEAN CATCH  Final   Special Requests   Final    NONE Performed at [facility], [address], [place], [place] [address]    Culture MULTIPLE SPECIES PRESENT, SUGGEST RECOLLECTION (A)  Final   Report Status 04/12/2022 FINAL  Final     Discharge Instructions:   Discharge Instructions     Call MD for:  persistant nausea and vomiting   Complete by: As directed    Call MD for:  severe uncontrolled pain   Complete by: As directed    Call MD for:  temperature >100.4   Complete by: As directed    Diet - low sodium heart healthy   Complete by: As directed    Discharge instructions   Complete by: As directed    Follow-up with the nephrology clinic in 1 week (office to schedule an appointment).  You have been discontinued on clonidine,spironolactone and colchicine.  Follow-up with your primary care provider as scheduled by you.  Seek medical attention for worsening symptoms.  Heating pad for back pain.   Increase activity slowly   Complete by: As directed       Allergies as of 04/15/2022       Reactions   Sulfa Antibiotics Hives, Rash        Medication List     STOP taking these medications    cloNIDine 0.1 MG tablet Commonly known as: CATAPRES   colchicine 0.6 MG tablet   spironolactone 100 MG tablet Commonly known as: ALDACTONE       TAKE these medications    Accu-Chek Guide test strip Generic drug: glucose  blood USE TO TEST BLOOD SUGAR EVERY DAY AS DIRECTED BY DOCTOR   allopurinol 100 MG tablet Commonly known as: ZYLOPRIM Take 100 mg by mouth daily.   amLODipine 10 MG tablet Commonly known  as: NORVASC TAKE 1 TABLET BY MOUTH EVERY DAY   atorvastatin 80 MG tablet Commonly known as: LIPITOR TAKE 1 TABLET BY MOUTH EVERY DAY   blood glucose meter kit and supplies Kit Dispense based on patient and insurance preference. Use one time daily as directed. E11.9   carvedilol 25 MG tablet Commonly known as: COREG Take 1 tablet (25 mg total) by mouth 2 (two) times daily with a meal.   docusate sodium 100 MG capsule Commonly known as: COLACE Take 1 capsule (100 mg total) by mouth daily as needed for mild constipation or moderate constipation.   ezetimibe 10 MG tablet Commonly known as: ZETIA TAKE 1 TABLET BY MOUTH EVERY DAY   levothyroxine 25 MCG tablet Commonly known as: SYNTHROID TAKE 1 TABLET BY MOUTH EVERY DAY   lidocaine 5 % Commonly known as: LIDODERM Place 1 patch onto the skin daily. Remove & Discard patch within 12 hours or as directed by MD   multivitamin with minerals tablet Take 1 tablet by mouth daily.   predniSONE 10 MG tablet Commonly known as: DELTASONE Take 2 tablets (20 mg total) by mouth daily with breakfast for 5 days.   tirzepatide 2.5 MG/0.5ML Pen Commonly known as: MOUNJARO Inject 2.5 mg into the skin once a week.   traZODone 50 MG tablet Commonly known as: DESYREL Take 2 tablets (100 mg total) by mouth at bedtime.   Vitamin D (Ergocalciferol) 1.25 MG (50000 UNIT) Caps capsule Commonly known as: DRISDOL TAKE 1 CAPSULE BY MOUTH ONE TIME PER WEEK        Follow-up Information     Philip AspenHernandez Acosta, Limmie PatriciaEstela Y, MD Follow up in 2 week(s).   Specialty: Internal Medicine Contact information: [address] [place] [address] [phone]         Zetta BillsPatel, Jay, MD Follow up in 1 week(s).   Specialty: Nephrology Why: Office to schedule an  appointment in a week or so.  If you do not hear in the next few days please call the office for appointnment Contact information: [address] [place] [address] [phone]                  Time coordinating discharge: 39 minutes  Signed:  Antonya Leeder  [facility] 04/15/2022, 10:50 AM

## 2022-04-15 NOTE — Progress Notes (Signed)
[facility] Progress Note   52 y.o. female with HTN, HLD, DM, OSA, morbid obesity, recurrent nephrolithiasis, CKD4 presenting with bilateral lower back pain associated with increased urinary frequency for the past week. She has had fatigue and also difficulty going up stairs with more naps as well. Labs from her primary care doctor showed worsening renal function and she was sent to the ED. Patient has  had decreased PO intake with nausea but no vomiting; patient denies any diarrhea or NSAID use, only taking Tylenol for pain.    Urinalysis showed bacteria and the patient was started on Rocephin. CT scan showed 2-8mm nephrolith in the LK and also suspicious findings for a RMC/RLL PNA.   Renal ultrasound showed no evidence of obstruction but with possible nephroliathiasis.  Assessment/ Plan:   Acute of chronic kidney disease IIIb/IV: followed by Dr. Allena Katz with  a baseline creatinine which appears to be in the the 2.8-3 range last seen in the office 11/06/2021. Patient has had 3+ proteinuria but a non active sediment on microscopy.  Acute decline in renal function almost certainly associated with the infection with CT showing RML/RLL PNA with ATN; 2-3 mm nephrolith in left kidney not causing obstruction.   Serologies are largely neg (ANCA and antiGBM pending currently) and I have a low suspicion for any active GN.  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  We discussed holding on biopsy unless any +serologies as I believe it will be low yield and not change management.  Kidney function is currently low but stable with no indications for dialysis.  At this point I think discharge home with close f/u is reasonable.  Homero Fellers discussion that she may improve or may not after this acute insult; if not improving will need to start making preparations for RRT in the near future.  It's going to take some time (4-6wks) to see if she's recovering and she  can do that at home.   -Maintain MAP>65 for optimal renal perfusion.  - Avoid nephrotoxic agents such as IV contrast, NSAIDs, and phosphate containing bowel preps (FLEETS) - Preferred narcotic agents for pain control are hydromorphone, fentanyl, and methadone. Morphine should not be used.  - Avoid Baclofen and avoid oral sodium phosphate and magnesium citrate based laxatives / bowel preps.  - Continue strict Input and Output monitoring. Will monitor the patient closely with you and intervene or adjust therapy as indicated by changes in clinical status/labs     Back pain possibly from a UTI? In the setting of a pt with a strong h/o nephrolithiasis.  Hypertension: spironolactone and clonidine on hold now in setting of AKI.  Clonidine was added yesterday but BP is if anything low, d/c'd.  Discharge on coreg and amlodipine which she's on here.  Pre DM:  A1c  5.9  Hypothyroidisim OSA Obesity - discussed wt loss  Subjective:   Feeling fine today; denies f/c/n/v/sob. UOP partial collection from yesterday - .  Having no LUTS.   Mom on speaker phone today   Objective:   BP 109/74 (BP Location: Right Arm)   Pulse 83   Temp 98.6 F (37 C) (Oral)   Resp 17   Ht 5\' 4"  (1.626 m)   Wt 128.4 kg   SpO2 100%   BMI 48.59 kg/m   Intake/Output Summary (Last 24 hours) at 04/15/2022 0934 Last data filed at 04/15/2022 0400 Gross per 24 hour  Intake 300 ml  Output 600 ml  Net -300 ml    Weight change:   Physical Exam: General appearance: alert, cooperative  Head: NCAT Back: symmetric, no CVA tenderness. Resp: clear to auscultation bilaterally, no rales Cardio: regular rate and rhythm GI: soft, obese, NTND +BS Extremities: no edema Pulses: 2+ and symmetric   Imaging: No results found.  Labs: BMET Recent Labs  Lab 04/09/22 1608 04/10/22 0503 04/11/22 0323 04/12/22 0218 04/13/22 0340 04/14/22 0255 04/15/22 0326  NA 135 135 132* 132* 133* 133* 132*  K 5.1 4.7 4.5 4.7 4.5 4.6  4.4  CL 104 104 103 100 103 105 105  CO2 20* 18* 18* 19* 17* 18* 19*  GLUCOSE 100* 97 120* 104* 102* 108* 118*  BUN 67* 61* 62* 59* 58* 58* 62*  CREATININE 5.02* 4.92* 4.99* 5.00* 4.90* 5.10* 4.98*  CALCIUM 10.1 9.0 8.6* 8.9 9.0 8.7* 8.6*  PHOS  --   --   --   --   --   --  7.3*    CBC Recent Labs  Lab 04/09/22 1608 04/11/22 0323 04/12/22 0218 04/14/22 0255  WBC 9.7 6.9 7.5 8.1  HGB 12.4 10.7* 10.9* 11.0*  HCT 34.7* 29.9* 30.3* 30.6*  MCV 90.8 91.2 91.0 89.5  PLT 273 222 234 232     Medications:     amLODipine  10 mg Oral Daily   atorvastatin  80 mg Oral Daily   carvedilol  25 mg Oral BID WC   cloNIDine  0.1 mg Oral BID   docusate sodium  100 mg Oral BID   ezetimibe  10 mg Oral Daily   heparin  5,000 Units Subcutaneous Q8H   insulin aspart  0-6 Units Subcutaneous TID WC   levothyroxine  25 mcg Oral Daily   lidocaine  1 patch Transdermal Q24H   polyethylene glycol  17 g Oral Daily   traZODone  100 mg Oral QHS     Estill Bakes MD [facility] Pager [phone]

## 2022-04-16 ENCOUNTER — Telehealth: Payer: Self-pay

## 2022-04-16 LAB — MISC LABCORP TEST (SEND OUT): Labcorp test code: 141330

## 2022-04-16 NOTE — Transitions of Care (Post Inpatient/ED Visit) (Signed)
   04/16/2022  Name: NAJAI JOLIVET MRN: [id number] DOB: 1970-09-07  Today's TOC FU Call Status:    Attempted to reach the patient regarding the most recent Inpatient/ED visit.  Follow Up Plan: Additional outreach attempts will be made to reach the patient to complete the Transitions of Care (Post Inpatient/ED visit) call.   Agnes Lawrence, CMA (AAMA)  [facility] [phone]

## 2022-04-18 LAB — PROTEIN ELECTROPHORESIS, SERUM
A/G Ratio: 1.1 (ref 0.7–1.7)
Albumin ELP: 3.4 g/dL (ref 2.9–4.4)
Alpha-2-Globulin: 0.9 g/dL (ref 0.4–1.0)

## 2022-04-19 LAB — PROTEIN ELECTROPHORESIS, SERUM
Alpha-1-Globulin: 0.2 g/dL (ref 0.0–0.4)
Beta Globulin: 1.1 g/dL (ref 0.7–1.3)
Gamma Globulin: 1 g/dL (ref 0.4–1.8)
Globulin, Total: 3.2 g/dL (ref 2.2–3.9)
Total Protein ELP: 6.6 g/dL (ref 6.0–8.5)

## 2022-04-21 ENCOUNTER — Encounter: Payer: Self-pay | Admitting: Internal Medicine

## 2022-04-21 ENCOUNTER — Ambulatory Visit (INDEPENDENT_AMBULATORY_CARE_PROVIDER_SITE_OTHER): Payer: 59 | Admitting: Internal Medicine

## 2022-04-21 VITALS — BP 129/85 | HR 81 | Temp 98.4°F | Wt 277.2 lb

## 2022-04-21 DIAGNOSIS — E1122 Type 2 diabetes mellitus with diabetic chronic kidney disease: Secondary | ICD-10-CM

## 2022-04-21 DIAGNOSIS — Z09 Encounter for follow-up examination after completed treatment for conditions other than malignant neoplasm: Secondary | ICD-10-CM | POA: Diagnosis not present

## 2022-04-21 DIAGNOSIS — I10 Essential (primary) hypertension: Secondary | ICD-10-CM

## 2022-04-21 DIAGNOSIS — N184 Chronic kidney disease, stage 4 (severe): Secondary | ICD-10-CM

## 2022-04-21 DIAGNOSIS — N1831 Chronic kidney disease, stage 3a: Secondary | ICD-10-CM

## 2022-04-21 DIAGNOSIS — G4733 Obstructive sleep apnea (adult) (pediatric): Secondary | ICD-10-CM | POA: Diagnosis not present

## 2022-04-21 NOTE — Progress Notes (Signed)
Established Patient Office Visit     CC/Reason for Visit: Hospital follow-up, acute on chronic kidney disease  HPI: Robyn [name] is a 52 y.o. female who is coming in today for the above mentioned reasons. Past Medical History is significant for: Morbid obesity, diabetes, hypertension, hyperlipidemia, OSA, stage IV chronic kidney disease.  She was sent to the hospital after a lab draw showed a creatinine that had increased from 2.9-5.  On discharge creatinine was 4.98.  She was also treated for UTI and pneumonia with antibiotics.  She is feeling back to baseline, no lower extremity edema.   Past Medical/Surgical History: Past Medical History:  Diagnosis Date   Abnormal Pap smear    Allergy    Anxiety    OCCAS PANIC ATTACKS   Back pain    Bilateral swelling of feet    Blood transfusion 1990'S   Blood transfusion without reported diagnosis    BV (bacterial vaginosis)    Cough    nonproductive cough last 2 weeks   Depression 2010   Fibroid 2011   Gallbladder problem    GERD (gastroesophageal reflux disease)    occasional   H/O dysmenorrhea    H/O varicella    H/O: menorrhagia 2011   Hypertension    Hypokalemia    PAST HX   Hypothyroid    Increased BMI    Kidney problem    Kidney stones    Obesity    OSA (obstructive sleep apnea) 04/22/2013   Ovarian cyst 2011   Perimenopausal symptoms 07/15/2010   Pre-diabetes    Pregnancy induced hypertension    Shortness of breath    ONLY WITH ANXIETY   Sleep apnea    PT USES CPAP SOMETIMES - SETTING IS 3   Vitamin D deficiency    Weight loss 2010    Past Surgical History:  Procedure Laterality Date   C-SECTIONS X 2     CESAREAN SECTION  1993 and 1999   CHOLECYSTECTOMY  1994   COLONOSCOPY WITH PROPOFOL N/A 10/26/2018   Procedure: COLONOSCOPY WITH PROPOFOL;  Surgeon: Charna Elizabeth, MD;  Location: [facility];  Service: Endoscopy;  Laterality: N/A;   NEPHROLITHOTOMY  04/09/2011   Procedure: NEPHROLITHOTOMY  PERCUTANEOUS;  Surgeon: Milford Cage, MD;  Location: [facility];  Service: Urology;  Laterality: Right;       NEPHROLITHOTOMY  10/15/2011   Procedure: NEPHROLITHOTOMY PERCUTANEOUS;  Surgeon: Milford Cage, MD;  Location: [facility];  Service: Urology;  Laterality: Left;   PERCUTANEOUS NEPHROSTOMY AROUND 1996  may 2013   right kidney   REMOVAL OF STONES  10/15/2011   Procedure: REMOVAL OF STONES;  Surgeon: Milford Cage, MD;  Location: [facility];  Service: Urology;  Laterality: Left;   TUBAL LIGATION  1999   URETEROSCOPY  04/09/2011   Procedure: URETEROSCOPY;  Surgeon: Milford Cage, MD;  Location: [facility];  Service: Urology;  Laterality: Right;   UTERINE ABLATION MARCH 2010  march 2011    Social History:  reports that she quit smoking about 10 years ago. Her smoking use included cigarettes. She has a 3.75 pack-year smoking history. She has never used smokeless tobacco. She reports current alcohol use. She reports that she does not use drugs.  Allergies: Allergies  Allergen Reactions   Sulfa Antibiotics Hives and Rash    Family History:  Family History  Problem Relation Age of Onset   Diabetes Mother    Hypertension Mother    Obesity Mother  Anxiety disorder Mother    Arthritis Mother    Depression Mother    Stroke Mother    Hypertension Father    Diabetes Father    Kidney disease Father    Stroke Father    Sleep apnea Father    Vision loss Father    Diabetes Paternal Grandmother    Diabetes Maternal Grandmother    Heart disease Maternal Grandmother      Current Outpatient Medications:    ACCU-CHEK GUIDE test strip, USE TO TEST BLOOD SUGAR EVERY DAY AS DIRECTED BY DOCTOR, Disp: 100 strip, Rfl: 0   allopurinol (ZYLOPRIM) 100 MG tablet, Take 100 mg by mouth daily. , Disp: , Rfl:    amLODipine (NORVASC) 10 MG tablet, TAKE 1 TABLET BY MOUTH EVERY DAY, Disp: 90 tablet, Rfl: 1   atorvastatin (LIPITOR) 80 MG tablet, TAKE 1 TABLET BY MOUTH EVERY DAY, Disp:  90 tablet, Rfl: 1   blood glucose meter kit and supplies KIT, Dispense based on patient and insurance preference. Use one time daily as directed. E11.9, Disp: 1 each, Rfl: 0   carvedilol (COREG) 25 MG tablet, Take 1 tablet (25 mg total) by mouth 2 (two) times daily with a meal., Disp: 30 tablet, Rfl: 0   docusate sodium (COLACE) 100 MG capsule, Take 1 capsule (100 mg total) by mouth daily as needed for mild constipation or moderate constipation., Disp: 20 capsule, Rfl: 0   ezetimibe (ZETIA) 10 MG tablet, TAKE 1 TABLET BY MOUTH EVERY DAY, Disp: 90 tablet, Rfl: 1   levothyroxine (SYNTHROID) 25 MCG tablet, TAKE 1 TABLET BY MOUTH EVERY DAY, Disp: 90 tablet, Rfl: 1   lidocaine (LIDODERM) 5 %, Place 1 patch onto the skin daily. Remove & Discard patch within 12 hours or as directed by MD, Disp: 30 patch, Rfl: 0   Multiple Vitamins-Minerals (MULTIVITAMIN WITH MINERALS) tablet, Take 1 tablet by mouth daily., Disp: , Rfl:    tirzepatide (MOUNJARO) 2.5 MG/0.5ML Pen, Inject 2.5 mg into the skin once a week., Disp: 2 mL, Rfl: 2   traZODone (DESYREL) 50 MG tablet, Take 2 tablets (100 mg total) by mouth at bedtime., Disp: 180 tablet, Rfl: 1   Vitamin D, Ergocalciferol, (DRISDOL) 1.25 MG (50000 UNIT) CAPS capsule, TAKE 1 CAPSULE BY MOUTH ONE TIME PER WEEK, Disp: 4 capsule, Rfl: 2  Review of Systems:  Negative unless indicated in HPI.   Physical Exam: Vitals:   04/21/22 1554  BP: 129/85  Pulse: 81  Temp: 98.4 F (36.9 C)  TempSrc: Oral  SpO2: 99%  Weight: 277 lb 3.2 oz (125.7 kg)    Body mass index is 47.58 kg/m.   Physical Exam Vitals reviewed.  Constitutional:      Appearance: Normal appearance.  HENT:     Head: Normocephalic and atraumatic.  Eyes:     Conjunctiva/sclera: Conjunctivae normal.     Pupils: Pupils are equal, round, and reactive to light.  Cardiovascular:     Rate and Rhythm: Normal rate and regular rhythm.  Pulmonary:     Effort: Pulmonary effort is normal.     Breath  sounds: Normal breath sounds.  Skin:    General: Skin is warm and dry.  Neurological:     General: No focal deficit present.     Mental Status: She is alert and oriented to person, place, and time.  Psychiatric:        Mood and Affect: Mood normal.        Behavior: Behavior normal.  Thought Content: Thought content normal.        Judgment: Judgment normal.      Impression and Plan:  Hospital discharge follow-up  CKD (chronic kidney disease) stage 4, GFR 15-29 ml/min  Type 2 diabetes mellitus with stage 3a chronic kidney disease, without long-term current use of insulin  OSA on CPAP  Primary hypertension  -Hospital charts reviewed in detail.  Her creatinine had increased from 2.9-5.09.  She did see nephrology while in the hospital.  Renal biopsy was deferred.  She has follow-up with them on Wednesday. -Will let nephrology draw labs on Wednesday as patient is concerned about multiple lab draws. -She was treated with antibiotics for UTI and pneumonia.  Time spent:31 minutes reviewing chart, interviewing and examining patient and formulating plan of care.     Chaya Jan, MD [facility]

## 2022-04-23 LAB — LAB REPORT - SCANNED
Creatinine, Random Urine: 85.1
Protein/Creatinine Ratio: 3209
Total Protein, Urine: 273.1

## 2022-04-30 ENCOUNTER — Other Ambulatory Visit: Payer: Self-pay | Admitting: Internal Medicine

## 2022-04-30 DIAGNOSIS — E1122 Type 2 diabetes mellitus with diabetic chronic kidney disease: Secondary | ICD-10-CM

## 2022-05-01 ENCOUNTER — Encounter: Payer: Self-pay | Admitting: *Deleted

## 2022-05-01 NOTE — Progress Notes (Signed)
PATIENT: Robyn [name] DOB: October 22, 1970  REASON FOR VISIT: follow up HISTORY FROM: patient   Virtual Visit via Video Note  I connected with Peggye [name] on 05/02/22 at 11:00 AM EDT by a video enabled telemedicine application located remotely at [facility] and verified that I am speaking with the correct person using two identifiers who was located at their own home.   I discussed the limitations of evaluation and management by telemedicine and the availability of in person appointments. The patient expressed understanding and agreed to proceed.   PATIENT: Robyn [name] DOB: [birth year]  REASON FOR VISIT: follow up HISTORY FROM: patient Primary neurologist: Dr. Vickey Huger  HISTORY OF PRESENT ILLNESS: Today 05/02/22:  [name] is a 52 y.o. female with a history of OSA on CPAP. Returns today for follow-up.  Reports that CPAP is working well for her.  Continues to find it beneficial.  She states that she did spend 6 days in the hospital which explains her gap in usage.       07/18/20: [name] is a 52 year old female with a history of obstructive sleep apnea on CPAP.  Her download indicates that she use her machine 27 out of 30 days for compliance of 90%.  She use her machine greater than 4 hours 20 days for compliance of 67%.  On average she uses her machine 5 hours and 39 minutes.  Her residual AHI is 1 on 6 to 18 cm water with EPR of 3.  Leak in the 95th percentile is 8.1 L/min.  She reports that when she uses the machine she does find it beneficial.  She denies any new symptoms.  She returns today for an evaluation.  HISTORY 07/26/19:   [name] is a 52 year old female with a history of obstructive sleep apnea on CPAP.  Her download indicates that she use her machine 23 out of 30 days for compliance of 77%.  She use her machine greater than 4 hours 16 days for compliance of 53%.  On average she uses her machine 5 hours and 10 minutes.  Her  residual AHI is 0.1 on 6 to 18 cm of water with EPR of 3.  Leak in the 95th percentile is 6.2 L/min.  Reports that the trazodone has been working fairly well there are some nights that she still has a hard time falling asleep and may have to take 75 mg.  REVIEW OF SYSTEMS: Out of a complete 14 system review of symptoms, the patient complains only of the following symptoms, and all other reviewed systems are negative.  ALLERGIES: Allergies  Allergen Reactions   Sulfa Antibiotics Hives and Rash    HOME MEDICATIONS: Outpatient Medications Prior to Visit  Medication Sig Dispense Refill   ACCU-CHEK GUIDE test strip USE TO TEST BLOOD SUGAR EVERY DAY AS DIRECTED BY DOCTOR 100 strip 0   allopurinol (ZYLOPRIM) 100 MG tablet Take 100 mg by mouth daily.      amLODipine (NORVASC) 10 MG tablet TAKE 1 TABLET BY MOUTH EVERY DAY 90 tablet 1   atorvastatin (LIPITOR) 80 MG tablet TAKE 1 TABLET BY MOUTH EVERY DAY 90 tablet 1   blood glucose meter kit and supplies KIT Dispense based on patient and insurance preference. Use one time daily as directed. E11.9 1 each 0   carvedilol (COREG) 25 MG tablet Take 1 tablet (25 mg total) by mouth 2 (two) times daily with a meal. 30 tablet 0   docusate sodium (  COLACE) 100 MG capsule Take 1 capsule (100 mg total) by mouth daily as needed for mild constipation or moderate constipation. 20 capsule 0   ezetimibe (ZETIA) 10 MG tablet TAKE 1 TABLET BY MOUTH EVERY DAY 90 tablet 1   levothyroxine (SYNTHROID) 25 MCG tablet TAKE 1 TABLET BY MOUTH EVERY DAY 90 tablet 1   lidocaine (LIDODERM) 5 % Place 1 patch onto the skin daily. Remove & Discard patch within 12 hours or as directed by MD 30 patch 0   Multiple Vitamins-Minerals (MULTIVITAMIN WITH MINERALS) tablet Take 1 tablet by mouth daily.     tirzepatide (MOUNJARO) 2.5 MG/0.5ML Pen INJECT 2.5 MG SUBCUTANEOUSLY WEEKLY 2 mL 2   traZODone (DESYREL) 50 MG tablet Take 2 tablets (100 mg total) by mouth at bedtime. 180 tablet 1    Vitamin D, Ergocalciferol, (DRISDOL) 1.25 MG (50000 UNIT) CAPS capsule TAKE 1 CAPSULE BY MOUTH ONE TIME PER WEEK 4 capsule 2   No facility-administered medications prior to visit.    PAST MEDICAL HISTORY: Past Medical History:  Diagnosis Date   Abnormal Pap smear    Allergy    Anxiety    OCCAS PANIC ATTACKS   Back pain    Bilateral swelling of feet    Blood transfusion 1990'S   Blood transfusion without reported diagnosis    BV (bacterial vaginosis)    Cough    nonproductive cough last 2 weeks   Depression 2010   Fibroid 2011   Gallbladder problem    GERD (gastroesophageal reflux disease)    occasional   H/O dysmenorrhea    H/O varicella    H/O: menorrhagia 2011   Hypertension    Hypokalemia    PAST HX   Hypothyroid    Increased BMI    Kidney problem    Kidney stones    Obesity    OSA (obstructive sleep apnea) 04/22/2013   Ovarian cyst 2011   Perimenopausal symptoms 07/15/2010   Pre-diabetes    Pregnancy induced hypertension    Shortness of breath    ONLY WITH ANXIETY   Sleep apnea    PT USES CPAP SOMETIMES - SETTING IS 3   Vitamin D deficiency    Weight loss 2010    PAST SURGICAL HISTORY: Past Surgical History:  Procedure Laterality Date   C-SECTIONS X 2     CESAREAN SECTION  1993 and 1999   CHOLECYSTECTOMY  1994   COLONOSCOPY WITH PROPOFOL N/A 10/26/2018   Procedure: COLONOSCOPY WITH PROPOFOL;  Surgeon: Charna Elizabeth, MD;  Location: [facility];  Service: Endoscopy;  Laterality: N/A;   NEPHROLITHOTOMY  04/09/2011   Procedure: NEPHROLITHOTOMY PERCUTANEOUS;  Surgeon: Milford Cage, MD;  Location: [facility];  Service: Urology;  Laterality: Right;       NEPHROLITHOTOMY  10/15/2011   Procedure: NEPHROLITHOTOMY PERCUTANEOUS;  Surgeon: Milford Cage, MD;  Location: [facility];  Service: Urology;  Laterality: Left;   PERCUTANEOUS NEPHROSTOMY AROUND 1996  may 2013   right kidney   REMOVAL OF STONES  10/15/2011   Procedure: REMOVAL OF STONES;  Surgeon:  Milford Cage, MD;  Location: [facility];  Service: Urology;  Laterality: Left;   TUBAL LIGATION  1999   URETEROSCOPY  04/09/2011   Procedure: URETEROSCOPY;  Surgeon: Milford Cage, MD;  [facility];  Service: Urology;  Laterality: Right;   UTERINE ABLATION MARCH 2010  march 2011    FAMILY HISTORY: Family History  Problem Relation Age of Onset   Diabetes Mother  Hypertension Mother    Obesity Mother    Anxiety disorder Mother    Arthritis Mother    Depression Mother    Stroke Mother    Hypertension Father    Diabetes Father    Kidney disease Father    Stroke Father    Sleep apnea Father    Vision loss Father    Diabetes Paternal Grandmother    Diabetes Maternal Grandmother    Heart disease Maternal Grandmother     SOCIAL HISTORY: Social History   Socioeconomic History   Marital status: Married    Spouse name: Greggory Stallion   Number of children: 2   Years of education: 15+   Highest education level: Bachelor's degree (e.g., BA, AB, BS)  Occupational History   Occupation: PROGRAM Aeronautical engineer: [employer]  Tobacco Use   Smoking status: Former    Packs/day: 0.25    Years: 15.00    Additional pack years: 0.00    Total pack years: 3.75    Types: Cigarettes    Quit date: 12/26/2011    Years since quitting: 10.3   Smokeless tobacco: Never  Substance and Sexual Activity   Alcohol use: Yes    Comment: occasional   Drug use: No   Sexual activity: Yes    Birth control/protection: Surgical    Comment: BTL 1999  Other Topics Concern   Not on file  Social History Narrative   Patient is married Greggory Stallion) and lives at home with her husband and daughter.   Patient has two children.   Patient is working and attending college full-time.   Patient has a college education.   Patient is right-handed.   Patient drinks two cups of coffee daily and one cup of soda and tea daily.   Social Determinants of Health   Financial Resource Strain: Medium Risk  (07/03/2021)   Overall Financial Resource Strain (CARDIA)    Difficulty of Paying Living Expenses: Somewhat hard  Food Insecurity: No Food Insecurity (07/03/2021)   Hunger Vital Sign    Worried About Running Out of Food in the Last Year: Never true    Ran Out of Food in the Last Year: Never true  Transportation Needs: No Transportation Needs (07/03/2021)   PRAPARE - Administrator, Civil Service (Medical): No    Lack of Transportation (Non-Medical): No  Physical Activity: Insufficiently Active (07/03/2021)   Exercise Vital Sign    Days of Exercise per Week: 2 days    Minutes of Exercise per Session: 60 min  Stress: Stress Concern Present (07/03/2021)   [employer] of Occupational Health - Occupational Stress Questionnaire    Feeling of Stress : Rather much  Social Connections: Socially Integrated (07/03/2021)   Social Connection and Isolation Panel [NHANES]    Frequency of Communication with Friends and Family: More than three times a week    Frequency of Social Gatherings with Friends and Family: Once a week    Attends Religious Services: More than 4 times per year    Active Member of [employer] or Organizations: Yes    Attends Engineer, structural: More than 4 times per year    Marital Status: Married  Catering manager Violence: Not on file      PHYSICAL EXAM Generalized: Well developed, in no acute distress   Neurological examination  Mentation: Alert oriented to time, place, history taking. Follows all commands speech and language fluent Cranial nerve II-XII facial symmetry noted   DIAGNOSTIC DATA (  LABS, IMAGING, TESTING) - I reviewed patient records, labs, notes, testing and imaging myself where available.  Lab Results  Component Value Date   WBC 8.1 04/14/2022   HGB 11.0 (L) 04/14/2022   HCT 30.6 (L) 04/14/2022   MCV 89.5 04/14/2022   PLT 232 04/14/2022      Component Value Date/Time   NA 132 (L) 04/15/2022 0326   NA 140 10/31/2021 0000   K  4.4 04/15/2022 0326   CL 105 04/15/2022 0326   CO2 19 (L) 04/15/2022 0326   GLUCOSE 118 (H) 04/15/2022 0326   BUN 62 (H) 04/15/2022 0326   BUN 23 (A) 10/31/2021 0000   CREATININE 4.98 (H) 04/15/2022 0326   CREATININE 1.57 (H) 11/17/2019 0903   CALCIUM 8.6 (L) 04/15/2022 0326   PROT 8.1 04/09/2022 1608   PROT 6.6 11/29/2018 0815   ALBUMIN 3.2 (L) 04/15/2022 0326   ALBUMIN 4.2 11/29/2018 0815   AST 15 04/09/2022 1608   ALT 24 04/09/2022 1608   ALKPHOS 80 04/09/2022 1608   BILITOT 1.1 04/09/2022 1608   BILITOT 0.8 11/29/2018 0815   GFRNONAA 10 (L) 04/15/2022 0326   GFRAA 26 01/14/2021 0000   Lab Results  Component Value Date   CHOL 154 05/15/2021   HDL 49.30 05/15/2021   LDLCALC 176 (H) 11/17/2019   LDLDIRECT 43.0 05/15/2021   TRIG 378.0 (H) 05/15/2021   CHOLHDL 3 05/15/2021   Lab Results  Component Value Date   HGBA1C 5.9 (A) 04/09/2022   Lab Results  Component Value Date   VITAMINB12 509 07/05/2021   Lab Results  Component Value Date   TSH 3.49 04/09/2022      ASSESSMENT AND PLAN 52 y.o. year old female  has a past medical history of Abnormal Pap smear, Allergy, Anxiety, Back pain, Bilateral swelling of feet, Blood transfusion (1990'S), Blood transfusion without reported diagnosis, BV (bacterial vaginosis), Cough, Depression (2010), Fibroid (2011), Gallbladder problem, GERD (gastroesophageal reflux disease), H/O dysmenorrhea, H/O varicella, H/O: menorrhagia (2011), Hypertension, Hypokalemia, Hypothyroid, Increased BMI, Kidney problem, Kidney stones, Obesity, OSA (obstructive sleep apnea) (04/22/2013), Ovarian cyst (2011), Perimenopausal symptoms (07/15/2010), Pre-diabetes, Pregnancy induced hypertension, Shortness of breath, Sleep apnea, Vitamin D deficiency, and Weight loss (2010). here with:  OSA on CPAP  CPAP compliance has improved Residual AHI is good Encouraged patient to continue using CPAP nightly and > 4 hours each night F/U in 1 year or sooner if  needed   Butch Penny, MSN, NP-C 05/02/2022, 10:55 AM [facility] [address] [place], [place] [address] [phone]

## 2022-05-02 ENCOUNTER — Telehealth (INDEPENDENT_AMBULATORY_CARE_PROVIDER_SITE_OTHER): Payer: 59 | Admitting: Adult Health

## 2022-05-02 DIAGNOSIS — G4733 Obstructive sleep apnea (adult) (pediatric): Secondary | ICD-10-CM

## 2022-05-22 ENCOUNTER — Encounter: Payer: Self-pay | Admitting: Internal Medicine

## 2022-05-27 ENCOUNTER — Encounter: Payer: Self-pay | Admitting: Internal Medicine

## 2022-05-27 ENCOUNTER — Ambulatory Visit (INDEPENDENT_AMBULATORY_CARE_PROVIDER_SITE_OTHER): Payer: 59 | Admitting: Internal Medicine

## 2022-05-27 VITALS — BP 110/80 | HR 76 | Temp 98.4°F | Wt 285.7 lb

## 2022-05-27 DIAGNOSIS — B86 Scabies: Secondary | ICD-10-CM

## 2022-05-27 MED ORDER — PERMETHRIN 5 % EX CREA
TOPICAL_CREAM | CUTANEOUS | 1 refills | Status: DC
Start: 2022-05-27 — End: 2022-08-25

## 2022-05-27 NOTE — Progress Notes (Signed)
Established Patient Office Visit     CC/Reason for Visit: Itchy rash  HPI: Robyn [name] is a 52 y.o. female who is coming in today for the above mentioned reasons.  About a week ago started noticing a rash that started off as little red bumps that have become excoriated.  Affected areas are trunk, back, buttock, arms.  She has not noticed any in her abdomen.  Husband and grown child that lives with her have the same.   Past Medical/Surgical History: Past Medical History:  Diagnosis Date   Abnormal Pap smear    Allergy    Anxiety    OCCAS PANIC ATTACKS   Back pain    Bilateral swelling of feet    Blood transfusion 1990'S   Blood transfusion without reported diagnosis    BV (bacterial vaginosis)    Cough    nonproductive cough last 2 weeks   Depression 2010   Fibroid 2011   Gallbladder problem    GERD (gastroesophageal reflux disease)    occasional   H/O dysmenorrhea    H/O varicella    H/O: menorrhagia 2011   Hypertension    Hypokalemia    PAST HX   Hypothyroid    Increased BMI    Kidney problem    Kidney stones    Obesity    OSA (obstructive sleep apnea) 04/22/2013   Ovarian cyst 2011   Perimenopausal symptoms 07/15/2010   Pre-diabetes    Pregnancy induced hypertension    Shortness of breath    ONLY WITH ANXIETY   Sleep apnea    PT USES CPAP SOMETIMES - SETTING IS 3   Vitamin D deficiency    Weight loss 2010    Past Surgical History:  Procedure Laterality Date   C-SECTIONS X 2     CESAREAN SECTION  1993 and 1999   CHOLECYSTECTOMY  1994   COLONOSCOPY WITH PROPOFOL N/A 10/26/2018   Procedure: COLONOSCOPY WITH PROPOFOL;  Surgeon: Charna Elizabeth, MD;  Location: [facility];  Service: Endoscopy;  Laterality: N/A;   NEPHROLITHOTOMY  04/09/2011   Procedure: NEPHROLITHOTOMY PERCUTANEOUS;  Surgeon: Milford Cage, MD;  Location: [facility];  Service: Urology;  Laterality: Right;       NEPHROLITHOTOMY  10/15/2011   Procedure: NEPHROLITHOTOMY  PERCUTANEOUS;  Surgeon: Milford Cage, MD;  Location: [facility];  Service: Urology;  Laterality: Left;   PERCUTANEOUS NEPHROSTOMY AROUND 1996  may 2013   right kidney   REMOVAL OF STONES  10/15/2011   Procedure: REMOVAL OF STONES;  Surgeon: Milford Cage, MD;  Location: [facility];  Service: Urology;  Laterality: Left;   TUBAL LIGATION  1999   URETEROSCOPY  04/09/2011   Procedure: URETEROSCOPY;  Surgeon: Milford Cage, MD;  Location: [facility];  Service: Urology;  Laterality: Right;   UTERINE ABLATION MARCH 2010  march 2011    Social History:  reports that she quit smoking about 10 years ago. Her smoking use included cigarettes. She has a 3.75 pack-year smoking history. She has never used smokeless tobacco. She reports current alcohol use. She reports that she does not use drugs.  Allergies: Allergies  Allergen Reactions   Sulfa Antibiotics Hives and Rash    Family History:  Family History  Problem Relation Age of Onset   Diabetes Mother    Hypertension Mother    Obesity Mother    Anxiety disorder Mother    Arthritis Mother    Depression Mother    Stroke Mother  Hypertension Father    Diabetes Father    Kidney disease Father    Stroke Father    Sleep apnea Father    Vision loss Father    Diabetes Paternal Grandmother    Diabetes Maternal Grandmother    Heart disease Maternal Grandmother      Current Outpatient Medications:    ACCU-CHEK GUIDE test strip, USE TO TEST BLOOD SUGAR EVERY DAY AS DIRECTED BY DOCTOR, Disp: 100 strip, Rfl: 0   allopurinol (ZYLOPRIM) 100 MG tablet, Take 100 mg by mouth daily. , Disp: , Rfl:    amLODipine (NORVASC) 10 MG tablet, TAKE 1 TABLET BY MOUTH EVERY DAY, Disp: 90 tablet, Rfl: 1   atorvastatin (LIPITOR) 80 MG tablet, TAKE 1 TABLET BY MOUTH EVERY DAY, Disp: 90 tablet, Rfl: 1   blood glucose meter kit and supplies KIT, Dispense based on patient and insurance preference. Use one time daily as directed. E11.9, Disp: 1 each, Rfl:  0   carvedilol (COREG) 25 MG tablet, Take 1 tablet (25 mg total) by mouth 2 (two) times daily with a meal., Disp: 30 tablet, Rfl: 0   docusate sodium (COLACE) 100 MG capsule, Take 1 capsule (100 mg total) by mouth daily as needed for mild constipation or moderate constipation., Disp: 20 capsule, Rfl: 0   ezetimibe (ZETIA) 10 MG tablet, TAKE 1 TABLET BY MOUTH EVERY DAY, Disp: 90 tablet, Rfl: 1   levothyroxine (SYNTHROID) 25 MCG tablet, TAKE 1 TABLET BY MOUTH EVERY DAY, Disp: 90 tablet, Rfl: 1   lidocaine (LIDODERM) 5 %, Place 1 patch onto the skin daily. Remove & Discard patch within 12 hours or as directed by MD, Disp: 30 patch, Rfl: 0   Multiple Vitamins-Minerals (MULTIVITAMIN WITH MINERALS) tablet, Take 1 tablet by mouth daily., Disp: , Rfl:    permethrin (ELIMITE) 5 % cream, Once application tonight. Whole body from the neck down. Wash off in the morning. Repeat in 1 week., Disp: 60 g, Rfl: 1   tirzepatide (MOUNJARO) 2.5 MG/0.5ML Pen, INJECT 2.5 MG SUBCUTANEOUSLY WEEKLY, Disp: 2 mL, Rfl: 2   torsemide (DEMADEX) 20 MG tablet, Take 20 mg by mouth daily. 2 tablets daily, Disp: , Rfl:    traZODone (DESYREL) 50 MG tablet, Take 2 tablets (100 mg total) by mouth at bedtime., Disp: 180 tablet, Rfl: 1   Vitamin D, Ergocalciferol, (DRISDOL) 1.25 MG (50000 UNIT) CAPS capsule, TAKE 1 CAPSULE BY MOUTH ONE TIME PER WEEK, Disp: 4 capsule, Rfl: 2  Review of Systems:  Negative unless indicated in HPI.   Physical Exam: Vitals:   05/27/22 1125  BP: 110/80  Pulse: 76  Temp: 98.4 F (36.9 C)  TempSrc: Oral  SpO2: 98%  Weight: 285 lb 11.2 oz (129.6 kg)    Body mass index is 49.04 kg/m.   Physical Exam Skin:    Comments: Please see attached pictures.           Impression and Plan:  Scabies -     Permethrin; Once application tonight. Whole body from the neck down. Wash off in the morning. Repeat in 1 week.  Dispense: 60 g; Refill: 1  -Some linear burrows are noted around her back,  excoriated red papules in other places.  This is classic for scabies.  Will treat with permethrin cream 1 application tonight and then she will repeat in a week.  Her immediate family needs to be treated as well, advised them to contact their physicians.   Time spent:23 minutes reviewing chart, interviewing and examining  patient and formulating plan of care.     Chaya Jan, MD [facility]

## 2022-05-28 ENCOUNTER — Encounter: Payer: Self-pay | Admitting: Internal Medicine

## 2022-05-29 ENCOUNTER — Other Ambulatory Visit: Payer: Self-pay | Admitting: Internal Medicine

## 2022-05-29 DIAGNOSIS — N1831 Chronic kidney disease, stage 3a: Secondary | ICD-10-CM

## 2022-05-29 DIAGNOSIS — E1122 Type 2 diabetes mellitus with diabetic chronic kidney disease: Secondary | ICD-10-CM

## 2022-05-29 MED ORDER — TIRZEPATIDE 5 MG/0.5ML ~~LOC~~ SOAJ
5.0000 mg | SUBCUTANEOUS | 0 refills | Status: DC
Start: 2022-05-29 — End: 2022-07-21

## 2022-06-17 LAB — HM DIABETES EYE EXAM

## 2022-07-01 ENCOUNTER — Other Ambulatory Visit: Payer: Self-pay | Admitting: Internal Medicine

## 2022-07-01 ENCOUNTER — Other Ambulatory Visit: Payer: Self-pay | Admitting: Adult Health

## 2022-07-01 DIAGNOSIS — E1169 Type 2 diabetes mellitus with other specified complication: Secondary | ICD-10-CM

## 2022-07-21 ENCOUNTER — Other Ambulatory Visit: Payer: Self-pay | Admitting: Internal Medicine

## 2022-07-21 DIAGNOSIS — N1831 Chronic kidney disease, stage 3a: Secondary | ICD-10-CM

## 2022-07-31 ENCOUNTER — Encounter: Payer: Self-pay | Admitting: Internal Medicine

## 2022-08-01 ENCOUNTER — Encounter: Payer: Self-pay | Admitting: Family Medicine

## 2022-08-01 ENCOUNTER — Telehealth: Payer: 59 | Admitting: Family Medicine

## 2022-08-01 VITALS — Ht 64.0 in

## 2022-08-01 DIAGNOSIS — R051 Acute cough: Secondary | ICD-10-CM | POA: Diagnosis not present

## 2022-08-01 DIAGNOSIS — U071 COVID-19: Secondary | ICD-10-CM

## 2022-08-01 MED ORDER — FLUTICASONE PROPIONATE 50 MCG/ACT NA SUSP
1.0000 | Freq: Every evening | NASAL | 0 refills | Status: AC | PRN
Start: 2022-08-01 — End: ?

## 2022-08-01 MED ORDER — MOLNUPIRAVIR EUA 200MG CAPSULE
4.0000 | ORAL_CAPSULE | Freq: Two times a day (BID) | ORAL | 0 refills | Status: AC
Start: 2022-08-01 — End: 2022-08-06

## 2022-08-01 MED ORDER — BENZONATATE 100 MG PO CAPS
100.0000 mg | ORAL_CAPSULE | Freq: Two times a day (BID) | ORAL | 0 refills | Status: AC | PRN
Start: 2022-08-01 — End: 2022-08-11

## 2022-08-01 NOTE — Progress Notes (Unsigned)
Virtual Visit via Video Note I connected with Robyn [name] on 08/01/22 by a video enabled telemedicine application and verified that I am speaking with the correct person using two identifiers. Location patient: home Location provider:work office Persons participating in the virtual visit: patient, provider  I discussed the limitations of evaluation and management by telemedicine and the availability of in person appointments. The patient expressed understanding and agreed to proceed.  Chief Complaint  Patient presents with   Covid Positive    Tested positive yesterday, symptoms started on Monday; congestion, productive cough, diarrhea, tired & headache.    HPI: Robyn [name] is a 52 year old female with past medical history significant for DM 2, hyperlipidemia, hypothyroidism, hypertension, CKD 4, and OSA being seen today with a positive COVID-19 test result and reports experiencing symptoms since Monday.  Her symptoms include fatigue, frontal headache, rhinorrhea, nasal congestion, sore throat, productive cough with clearish sputum, and diarrhea. Mild changes in taste and smell.  She mentions that the diarrhea started today, with two episodes and no blood present.  Additionally, she reports feeling nauseous but has not experienced any vomiting. Negative for fever, chills, visual changes, stridor, CP, dyspnea, wheezing, abdominal pain, urinary symptoms, or skin rash.  She recently attended a work conference in [place], where she may have been exposed to COVID-19.   To alleviate her congestion, she has been taking Mucinex and reports a severe cough, especially at night.   She requires a work note starting from yesterday, as she flew back from [place].  Lab Results  Component Value Date   NA 132 (L) 04/15/2022   CL 105 04/15/2022   K 4.4 04/15/2022   CO2 19 (L) 04/15/2022   BUN 62 (H) 04/15/2022   CREATININE 4.98 (H) 04/15/2022   GFRNONAA 10 (L) 04/15/2022   CALCIUM 8.6 (L)  04/15/2022   PHOS 7.3 (H) 04/15/2022   ALBUMIN 3.2 (L) 04/15/2022   GLUCOSE 118 (H) 04/15/2022   ROS: See pertinent positives and negatives per HPI.  Past Medical History:  Diagnosis Date   Abnormal Pap smear    Allergy    Anxiety    OCCAS PANIC ATTACKS   Back pain    Bilateral swelling of feet    Blood transfusion 1990'S   Blood transfusion without reported diagnosis    BV (bacterial vaginosis)    Cough    nonproductive cough last 2 weeks   Depression 2010   Fibroid 2011   Gallbladder problem    GERD (gastroesophageal reflux disease)    occasional   H/O dysmenorrhea    H/O varicella    H/O: menorrhagia 2011   Hypertension    Hypokalemia    PAST HX   Hypothyroid    Increased BMI    Kidney problem    Kidney stones    Obesity    OSA (obstructive sleep apnea) 04/22/2013   Ovarian cyst 2011   Perimenopausal symptoms 07/15/2010   Pre-diabetes    Pregnancy induced hypertension    Shortness of breath    ONLY WITH ANXIETY   Sleep apnea    PT USES CPAP SOMETIMES - SETTING IS 3   Vitamin D deficiency    Weight loss 2010    Past Surgical History:  Procedure Laterality Date   C-SECTIONS X 2     CESAREAN SECTION  1993 and 1999   CHOLECYSTECTOMY  1994   COLONOSCOPY WITH PROPOFOL N/A 10/26/2018   Procedure: COLONOSCOPY WITH PROPOFOL;  Surgeon: Charna Elizabeth, MD;  Location: [facility];  Service: Endoscopy;  Laterality: N/A;   NEPHROLITHOTOMY  04/09/2011   Procedure: NEPHROLITHOTOMY PERCUTANEOUS;  Surgeon: Milford Cage, MD;  Location: [facility];  Service: Urology;  Laterality: Right;       NEPHROLITHOTOMY  10/15/2011   Procedure: NEPHROLITHOTOMY PERCUTANEOUS;  Surgeon: Milford Cage, MD;  Location: [facility];  Service: Urology;  Laterality: Left;   PERCUTANEOUS NEPHROSTOMY AROUND 1996  may 2013   right kidney   REMOVAL OF STONES  10/15/2011   Procedure: REMOVAL OF STONES;  Surgeon: Milford Cage, MD;  Location: [facility];  Service: Urology;  Laterality: Left;    TUBAL LIGATION  1999   URETEROSCOPY  04/09/2011   Procedure: URETEROSCOPY;  Surgeon: Milford Cage, MD;  Location: [facility];  Service: Urology;  Laterality: Right;   UTERINE ABLATION MARCH 2010  march 2011    Family History  Problem Relation Age of Onset   Diabetes Mother    Hypertension Mother    Obesity Mother    Anxiety disorder Mother    Arthritis Mother    Depression Mother    Stroke Mother    Hypertension Father    Diabetes Father    Kidney disease Father    Stroke Father    Sleep apnea Father    Vision loss Father    Diabetes Paternal Grandmother    Diabetes Maternal Grandmother    Heart disease Maternal Grandmother     Social History   Socioeconomic History   Marital status: Married    Spouse name: Greggory Stallion   Number of children: 2   Years of education: 15+   Highest education level: Bachelor's degree (e.g., BA, AB, BS)  Occupational History   Occupation: PROGRAM Aeronautical engineer: [employer]  Tobacco Use   Smoking status: Former    Current packs/day: 0.00    Average packs/day: 0.3 packs/day for 15.0 years (3.8 ttl pk-yrs)    Types: Cigarettes    Start date: 12/25/1996    Quit date: 12/26/2011    Years since quitting: 10.6   Smokeless tobacco: Never  Substance and Sexual Activity   Alcohol use: Yes    Comment: occasional   Drug use: No   Sexual activity: Yes    Birth control/protection: Surgical    Comment: BTL 1999  Other Topics Concern   Not on file  Social History Narrative   Patient is married Greggory Stallion) and lives at home with her husband and daughter.   Patient has two children.   Patient is working and attending college full-time.   Patient has a college education.   Patient is right-handed.   Patient drinks two cups of coffee daily and one cup of soda and tea daily.   Social Determinants of Health   Financial Resource Strain: Medium Risk (07/03/2021)   Overall Financial Resource Strain (CARDIA)    Difficulty of Paying Living  Expenses: Somewhat hard  Food Insecurity: No Food Insecurity (07/03/2021)   Hunger Vital Sign    Worried About Running Out of Food in the Last Year: Never true    Ran Out of Food in the Last Year: Never true  Transportation Needs: No Transportation Needs (07/03/2021)   PRAPARE - Administrator, Civil Service (Medical): No    Lack of Transportation (Non-Medical): No  Physical Activity: Insufficiently Active (07/03/2021)   Exercise Vital Sign    Days of Exercise per Week: 2 days    Minutes of Exercise per Session: 60 min  Stress:  Stress Concern Present (07/03/2021)   [employer] of Occupational Health - Occupational Stress Questionnaire    Feeling of Stress : Rather much  Social Connections: Socially Integrated (07/03/2021)   Social Connection and Isolation Panel [NHANES]    Frequency of Communication with Friends and Family: More than three times a week    Frequency of Social Gatherings with Friends and Family: Once a week    Attends Religious Services: More than 4 times per year    Active Member of [employer] or Organizations: Yes    Attends Engineer, structural: More than 4 times per year    Marital Status: Married  Catering manager Violence: Not on file     Current Outpatient Medications:    ACCU-CHEK GUIDE test strip, USE TO TEST BLOOD SUGAR EVERY DAY AS DIRECTED BY DOCTOR, Disp: 100 strip, Rfl: 0   allopurinol (ZYLOPRIM) 100 MG tablet, Take 100 mg by mouth daily. , Disp: , Rfl:    amLODipine (NORVASC) 10 MG tablet, TAKE 1 TABLET BY MOUTH EVERY DAY, Disp: 90 tablet, Rfl: 1   atorvastatin (LIPITOR) 80 MG tablet, TAKE 1 TABLET BY MOUTH EVERY DAY, Disp: 90 tablet, Rfl: 1   benzonatate (TESSALON) 100 MG capsule, Take 1 capsule (100 mg total) by mouth 2 (two) times daily as needed for up to 10 days., Disp: 20 capsule, Rfl: 0   blood glucose meter kit and supplies KIT, Dispense based on patient and insurance preference. Use one time daily as directed. E11.9, Disp: 1  each, Rfl: 0   carvedilol (COREG) 25 MG tablet, Take 1 tablet (25 mg total) by mouth 2 (two) times daily with a meal., Disp: 30 tablet, Rfl: 0   docusate sodium (COLACE) 100 MG capsule, Take 1 capsule (100 mg total) by mouth daily as needed for mild constipation or moderate constipation., Disp: 20 capsule, Rfl: 0   ezetimibe (ZETIA) 10 MG tablet, TAKE 1 TABLET BY MOUTH EVERY DAY, Disp: 90 tablet, Rfl: 1   fluticasone (FLONASE) 50 MCG/ACT nasal spray, Place 1 spray into both nostrils at bedtime as needed for allergies or rhinitis., Disp: 16 g, Rfl: 0   levothyroxine (SYNTHROID) 25 MCG tablet, TAKE 1 TABLET BY MOUTH EVERY DAY, Disp: 90 tablet, Rfl: 1   lidocaine (LIDODERM) 5 %, Place 1 patch onto the skin daily. Remove & Discard patch within 12 hours or as directed by MD, Disp: 30 patch, Rfl: 0   molnupiravir EUA (LAGEVRIO) 200 mg CAPS capsule, Take 4 capsules (800 mg total) by mouth 2 (two) times daily for 5 days., Disp: 40 capsule, Rfl: 0   Multiple Vitamins-Minerals (MULTIVITAMIN WITH MINERALS) tablet, Take 1 tablet by mouth daily., Disp: , Rfl:    permethrin (ELIMITE) 5 % cream, Once application tonight. Whole body from the neck down. Wash off in the morning. Repeat in 1 week., Disp: 60 g, Rfl: 1   tirzepatide (MOUNJARO) 5 MG/0.5ML Pen, INJECT 5 MG SUBCUTANEOUSLY WEEKLY, Disp: 2 mL, Rfl: 0   torsemide (DEMADEX) 20 MG tablet, Take 20 mg by mouth daily. 2 tablets daily, Disp: , Rfl:    traZODone (DESYREL) 50 MG tablet, TAKE 2 TABLETS BY MOUTH AT BEDTIME., Disp: 180 tablet, Rfl: 1   Vitamin D, Ergocalciferol, (DRISDOL) 1.25 MG (50000 UNIT) CAPS capsule, TAKE 1 CAPSULE BY MOUTH ONE TIME PER WEEK, Disp: 4 capsule, Rfl: 2  EXAM:  VITALS per patient if applicable:Ht 5\' 4"  (1.626 m)   BMI 49.04 kg/m   GENERAL: alert, oriented, appears well and in  no acute distress  HEENT: atraumatic, conjunctiva clear, no obvious abnormalities on inspection of external nose and ears Nasal congestion.  NECK:  normal movements of the head and neck  LUNGS: on inspection no signs of respiratory distress, breathing rate appears normal, no obvious gross SOB, gasping or wheezing  CV: no obvious cyanosis  MS: moves all visible extremities without noticeable abnormality  PSYCH/NEURO: pleasant and cooperative, no obvious depression or anxiety, speech and thought processing grossly intact  ASSESSMENT AND PLAN:  Discussed the following assessment and plan:  COVID-19 virus infection - Plan: molnupiravir EUA (LAGEVRIO) 200 mg CAPS capsule, fluticasone (FLONASE) 50 MCG/ACT nasal spray  Acute cough - Plan: benzonatate (TESSALON) 100 MG capsule  COVID-19 virus infection We discussed Dx,possible complications and treatment options. She has a mild case with risk for complications. We discussed oral antiviral options and side effects. Because her history of CKD 4, molnupiravir is the most appropriate option at this time.  Today is her for/for today, explained that antiviral medication may not be as effective as when started earlier but given her comorbidities and risk for complication, recommend starting antiviral medication. Symptomatic treatment with plenty of fluids,rest,tylenol 500 mg 3-4 times per day prn. Throat lozenges if needed for sore throat.  Clearly instructed about warning signs.  -     molnupiravir EUA; Take 4 capsules (800 mg total) by mouth 2 (two) times daily for 5 days.  Dispense: 40 capsule; Refill: 0 -     Fluticasone Propionate; Place 1 spray into both nostrils at bedtime as needed for allergies or rhinitis.  Dispense: 16 g; Refill: 0  Acute cough Continue adequate hydration and plain Mucinex. Benzonatate to help with symptoms. I do not think imaging is needed at this time. Explained that cough and congestion may last a few more days and even weeks after acute symptoms have resolved.  -     Benzonatate; Take 1 capsule (100 mg total) by mouth 2 (two) times daily as needed for up to  10 days.  Dispense: 20 capsule; Refill: 0  We discussed possible serious and likely etiologies, options for evaluation and workup, limitations of telemedicine visit vs in person visit, treatment, treatment risks and precautions. The patient was advised to call back or seek an in-person evaluation if the symptoms worsen or if the condition fails to improve as anticipated. I discussed the assessment and treatment plan with the patient. The patient was provided an opportunity to ask questions and all were answered. The patient agreed with the plan and demonstrated an understanding of the instructions.  No follow-ups on file.  Chike Farrington G. [place], MD  [facility]. [place] office.

## 2022-08-08 ENCOUNTER — Other Ambulatory Visit: Payer: Self-pay | Admitting: Internal Medicine

## 2022-08-16 ENCOUNTER — Other Ambulatory Visit: Payer: Self-pay | Admitting: Internal Medicine

## 2022-08-16 DIAGNOSIS — N1831 Chronic kidney disease, stage 3a: Secondary | ICD-10-CM

## 2022-08-16 DIAGNOSIS — I10 Essential (primary) hypertension: Secondary | ICD-10-CM

## 2022-08-23 ENCOUNTER — Other Ambulatory Visit: Payer: Self-pay | Admitting: Internal Medicine

## 2022-08-23 ENCOUNTER — Encounter: Payer: Self-pay | Admitting: Internal Medicine

## 2022-08-23 DIAGNOSIS — N1831 Chronic kidney disease, stage 3a: Secondary | ICD-10-CM

## 2022-08-25 ENCOUNTER — Other Ambulatory Visit: Payer: Self-pay | Admitting: Internal Medicine

## 2022-08-25 DIAGNOSIS — E1122 Type 2 diabetes mellitus with diabetic chronic kidney disease: Secondary | ICD-10-CM

## 2022-08-25 MED ORDER — TIRZEPATIDE 7.5 MG/0.5ML ~~LOC~~ SOAJ
7.5000 mg | SUBCUTANEOUS | 0 refills | Status: DC
Start: 2022-08-25 — End: 2022-11-12

## 2022-10-05 ENCOUNTER — Other Ambulatory Visit: Payer: Self-pay | Admitting: Internal Medicine

## 2022-10-05 ENCOUNTER — Other Ambulatory Visit: Payer: Self-pay | Admitting: Adult Health

## 2022-10-05 DIAGNOSIS — E1169 Type 2 diabetes mellitus with other specified complication: Secondary | ICD-10-CM

## 2022-11-12 ENCOUNTER — Encounter: Payer: Self-pay | Admitting: Internal Medicine

## 2022-11-12 ENCOUNTER — Other Ambulatory Visit: Payer: Self-pay | Admitting: Internal Medicine

## 2022-11-12 DIAGNOSIS — N1831 Chronic kidney disease, stage 3a: Secondary | ICD-10-CM

## 2022-11-12 MED ORDER — TIRZEPATIDE 10 MG/0.5ML ~~LOC~~ SOAJ
10.0000 mg | SUBCUTANEOUS | 0 refills | Status: DC
Start: 2022-11-12 — End: 2023-02-02

## 2022-12-23 NOTE — Telephone Encounter (Signed)
Abstracted result and updated Care Team

## 2023-01-15 LAB — BASIC METABOLIC PANEL
BUN: 34 — AB (ref 4–21)
CO2: 20 (ref 13–22)
Chloride: 106 (ref 99–108)
Creatinine: 3.7 — AB (ref 0.5–1.1)
Glucose: 91

## 2023-01-15 LAB — CBC AND DIFFERENTIAL
HCT: 35 — AB (ref 36–46)
Hemoglobin: 11.5 — AB (ref 12.0–16.0)
Platelets: 138 10*3/uL — AB (ref 150–400)
WBC: 9.2

## 2023-01-15 LAB — VITAMIN D 25 HYDROXY (VIT D DEFICIENCY, FRACTURES): Vit D, 25-Hydroxy: 36.7

## 2023-01-15 LAB — COMPREHENSIVE METABOLIC PANEL
Albumin: 4.3 (ref 3.5–5.0)
Calcium: 9.7 (ref 8.7–10.7)

## 2023-01-22 ENCOUNTER — Encounter: Payer: Self-pay | Admitting: Internal Medicine

## 2023-02-01 ENCOUNTER — Other Ambulatory Visit: Payer: Self-pay | Admitting: Internal Medicine

## 2023-02-01 DIAGNOSIS — E1122 Type 2 diabetes mellitus with diabetic chronic kidney disease: Secondary | ICD-10-CM

## 2023-02-04 ENCOUNTER — Ambulatory Visit: Payer: Self-pay | Admitting: Internal Medicine

## 2023-02-04 ENCOUNTER — Encounter: Payer: Self-pay | Admitting: Internal Medicine

## 2023-02-04 ENCOUNTER — Telehealth: Payer: Self-pay | Admitting: Internal Medicine

## 2023-02-04 ENCOUNTER — Ambulatory Visit (INDEPENDENT_AMBULATORY_CARE_PROVIDER_SITE_OTHER): Payer: 59 | Admitting: Internal Medicine

## 2023-02-04 VITALS — BP 110/80 | HR 75 | Temp 98.2°F | Ht 65.0 in | Wt 264.1 lb

## 2023-02-04 DIAGNOSIS — E785 Hyperlipidemia, unspecified: Secondary | ICD-10-CM

## 2023-02-04 DIAGNOSIS — I10 Essential (primary) hypertension: Secondary | ICD-10-CM

## 2023-02-04 DIAGNOSIS — Z23 Encounter for immunization: Secondary | ICD-10-CM | POA: Diagnosis not present

## 2023-02-04 DIAGNOSIS — Z0000 Encounter for general adult medical examination without abnormal findings: Secondary | ICD-10-CM | POA: Diagnosis not present

## 2023-02-04 DIAGNOSIS — N184 Chronic kidney disease, stage 4 (severe): Secondary | ICD-10-CM

## 2023-02-04 DIAGNOSIS — N1831 Chronic kidney disease, stage 3a: Secondary | ICD-10-CM | POA: Diagnosis not present

## 2023-02-04 DIAGNOSIS — E1169 Type 2 diabetes mellitus with other specified complication: Secondary | ICD-10-CM

## 2023-02-04 DIAGNOSIS — E876 Hypokalemia: Secondary | ICD-10-CM

## 2023-02-04 DIAGNOSIS — E039 Hypothyroidism, unspecified: Secondary | ICD-10-CM

## 2023-02-04 DIAGNOSIS — E1122 Type 2 diabetes mellitus with diabetic chronic kidney disease: Secondary | ICD-10-CM | POA: Diagnosis not present

## 2023-02-04 DIAGNOSIS — E781 Pure hyperglyceridemia: Secondary | ICD-10-CM

## 2023-02-04 DIAGNOSIS — E559 Vitamin D deficiency, unspecified: Secondary | ICD-10-CM

## 2023-02-04 LAB — COMPREHENSIVE METABOLIC PANEL
ALT: 29 U/L (ref 0–35)
AST: 16 U/L (ref 0–37)
Albumin: 4.7 g/dL (ref 3.5–5.2)
Alkaline Phosphatase: 107 U/L (ref 39–117)
BUN: 36 mg/dL — ABNORMAL HIGH (ref 6–23)
CO2: 27 meq/L (ref 19–32)
Calcium: 9.6 mg/dL (ref 8.4–10.5)
Chloride: 103 meq/L (ref 96–112)
Creatinine, Ser: 3.52 mg/dL — ABNORMAL HIGH (ref 0.40–1.20)
GFR: 14.4 mL/min — CL (ref >60.00)
Glucose, Bld: 98 mg/dL (ref 70–99)
Potassium: 4.4 meq/L (ref 3.5–5.1)
Sodium: 138 meq/L (ref 135–145)
Total Bilirubin: 1.1 mg/dL (ref 0.2–1.2)
Total Protein: 7.4 g/dL (ref 6.0–8.3)

## 2023-02-04 LAB — CBC WITH DIFFERENTIAL/PLATELET
Basophils Absolute: 0 10*3/uL (ref 0.0–0.1)
Basophils Relative: 0.5 % (ref 0.0–3.0)
Eosinophils Absolute: 0.4 10*3/uL (ref 0.0–0.7)
Eosinophils Relative: 5.9 % — ABNORMAL HIGH (ref 0.0–5.0)
HCT: 36.2 % (ref 36.0–46.0)
Hemoglobin: 12.2 g/dL (ref 12.0–15.0)
Lymphocytes Relative: 24.1 % (ref 12.0–46.0)
Lymphs Abs: 1.8 10*3/uL (ref 0.7–4.0)
MCHC: 33.7 g/dL (ref 30.0–36.0)
MCV: 90.3 fL (ref 78.0–100.0)
Monocytes Absolute: 0.5 10*3/uL (ref 0.1–1.0)
Monocytes Relative: 6.5 % (ref 3.0–12.0)
Neutro Abs: 4.6 10*3/uL (ref 1.4–7.7)
Neutrophils Relative %: 63 % (ref 43.0–77.0)
Platelets: 304 10*3/uL (ref 150.0–400.0)
RBC: 4.01 Mil/uL (ref 3.87–5.11)
RDW: 14.2 % (ref 11.5–15.5)
WBC: 7.3 10*3/uL (ref 4.0–10.5)

## 2023-02-04 LAB — LIPID PANEL
Cholesterol: 109 mg/dL (ref 0–200)
HDL: 43 mg/dL (ref >39.00)
LDL Cholesterol: 48 mg/dL (ref 0–99)
NonHDL: 66.43
Total CHOL/HDL Ratio: 3
Triglycerides: 92 mg/dL (ref 0.0–149.0)
VLDL: 18.4 mg/dL (ref 0.0–40.0)

## 2023-02-04 LAB — VITAMIN B12: Vitamin B-12: 783 pg/mL (ref 211–911)

## 2023-02-04 LAB — HEMOGLOBIN A1C: Hgb A1c MFr Bld: 5.4 % (ref 4.6–6.5)

## 2023-02-04 LAB — MICROALBUMIN / CREATININE URINE RATIO
Creatinine,U: 76.7 mg/dL
Microalb Creat Ratio: 127 mg/g — ABNORMAL HIGH (ref 0.0–30.0)
Microalb, Ur: 97.5 mg/dL — ABNORMAL HIGH (ref 0.0–1.9)

## 2023-02-04 LAB — VITAMIN D 25 HYDROXY (VIT D DEFICIENCY, FRACTURES): VITD: 33.96 ng/mL (ref 30.00–100.00)

## 2023-02-04 LAB — TSH: TSH: 3.2 u[IU]/mL (ref 0.35–5.50)

## 2023-02-04 NOTE — Addendum Note (Signed)
Addended by: Kern Reap B on: 02/04/2023 02:18 PM   Modules accepted: Orders

## 2023-02-04 NOTE — Progress Notes (Signed)
Established Patient Office Visit     CC/Reason for Visit: Annual preventive exam  HPI: Robyn [name] is a 53 y.o. female who is coming in today for the above mentioned reasons. Past Medical History is significant for: Morbid obesity, hypertension, hyperlipidemia, type 2 diabetes, chronic kidney disease stage IV-V managed by Dr. Allena Katz and in the process of getting set up for kidney transplant at [facility], obstructive sleep apnea managed by Dr. Vickey Huger.  She is feeling well without acute concerns or complaints.  She has routine eye and dental care.  She had a colonoscopy in 2020.   Past Medical/Surgical History: Past Medical History:  Diagnosis Date   Abnormal Pap smear    Allergy    Anxiety    OCCAS PANIC ATTACKS   Back pain    Bilateral swelling of feet    Blood transfusion 1990'S   Blood transfusion without reported diagnosis    BV (bacterial vaginosis)    Cough    nonproductive cough last 2 weeks   Depression 2010   Fibroid 2011   Gallbladder problem    GERD (gastroesophageal reflux disease)    occasional   H/O dysmenorrhea    H/O varicella    H/O: menorrhagia 2011   Hypertension    Hypokalemia    PAST HX   Hypothyroid    Increased BMI    Kidney problem    Kidney stones    Obesity    OSA (obstructive sleep apnea) 04/22/2013   Ovarian cyst 2011   Perimenopausal symptoms 07/15/2010   Pre-diabetes    Pregnancy induced hypertension    Shortness of breath    ONLY WITH ANXIETY   Sleep apnea    PT USES CPAP SOMETIMES - SETTING IS 3   Vitamin D deficiency    Weight loss 2010    Past Surgical History:  Procedure Laterality Date   C-SECTIONS X 2     CESAREAN SECTION  1993 and 1999   CHOLECYSTECTOMY  1994   COLONOSCOPY WITH PROPOFOL N/A 10/26/2018   Procedure: COLONOSCOPY WITH PROPOFOL;  Surgeon: Charna Elizabeth, MD;  Location: [facility];  Service: Endoscopy;  Laterality: N/A;   NEPHROLITHOTOMY  04/09/2011   Procedure: NEPHROLITHOTOMY PERCUTANEOUS;  Surgeon:  Milford Cage, MD;  Location: [facility];  Service: Urology;  Laterality: Right;       NEPHROLITHOTOMY  10/15/2011   Procedure: NEPHROLITHOTOMY PERCUTANEOUS;  Surgeon: Milford Cage, MD;  Location: [facility];  Service: Urology;  Laterality: Left;   PERCUTANEOUS NEPHROSTOMY AROUND 1996  may 2013   right kidney   REMOVAL OF STONES  10/15/2011   Procedure: REMOVAL OF STONES;  Surgeon: Milford Cage, MD;  [facility];  Service: Urology;  Laterality: Left;   TUBAL LIGATION  1999   URETEROSCOPY  04/09/2011   Procedure: URETEROSCOPY;  Surgeon: Milford Cage, MD;  Location: [facility];  Service: Urology;  Laterality: Right;   UTERINE ABLATION MARCH 2010  march 2011    Social History:  reports that she quit smoking about 11 years ago. Her smoking use included cigarettes. She started smoking about 26 years ago. She has a 3.8 pack-year smoking history. She has never used smokeless tobacco. She reports current alcohol use. She reports that she does not use drugs.  Allergies: Allergies  Allergen Reactions   Sulfa Antibiotics Hives and Rash    Family History:  Family History  Problem Relation Age of Onset   Diabetes Mother    Hypertension Mother  Obesity Mother    Anxiety disorder Mother    Arthritis Mother    Depression Mother    Stroke Mother    Hypertension Father    Diabetes Father    Kidney disease Father    Stroke Father    Sleep apnea Father    Vision loss Father    Diabetes Paternal Grandmother    Diabetes Maternal Grandmother    Heart disease Maternal Grandmother      Current Outpatient Medications:    ACCU-CHEK GUIDE test strip, USE TO TEST BLOOD SUGAR EVERY DAY AS DIRECTED BY DOCTOR, Disp: 100 strip, Rfl: 0   allopurinol (ZYLOPRIM) 100 MG tablet, Take 100 mg by mouth daily. , Disp: , Rfl:    amLODipine (NORVASC) 10 MG tablet, TAKE 1 TABLET BY MOUTH EVERY DAY, Disp: 90 tablet, Rfl: 1   atorvastatin (LIPITOR) 80 MG tablet, TAKE 1 TABLET BY MOUTH  EVERY DAY, Disp: 90 tablet, Rfl: 0   blood glucose meter kit and supplies KIT, Dispense based on patient and insurance preference. Use one time daily as directed. E11.9, Disp: 1 each, Rfl: 0   carvedilol (COREG) 25 MG tablet, Take 1 tablet (25 mg total) by mouth 2 (two) times daily with a meal., Disp: 30 tablet, Rfl: 0   docusate sodium (COLACE) 100 MG capsule, Take 1 capsule (100 mg total) by mouth daily as needed for mild constipation or moderate constipation., Disp: 20 capsule, Rfl: 0   ezetimibe (ZETIA) 10 MG tablet, TAKE 1 TABLET BY MOUTH EVERY DAY, Disp: 90 tablet, Rfl: 0   fluticasone (FLONASE) 50 MCG/ACT nasal spray, Place 1 spray into both nostrils at bedtime as needed for allergies or rhinitis., Disp: 16 g, Rfl: 0   levothyroxine (SYNTHROID) 25 MCG tablet, TAKE 1 TABLET BY MOUTH EVERY DAY, Disp: 90 tablet, Rfl: 0   lidocaine (LIDODERM) 5 %, Place 1 patch onto the skin daily. Remove & Discard patch within 12 hours or as directed by MD, Disp: 30 patch, Rfl: 0   Multiple Vitamins-Minerals (MULTIVITAMIN WITH MINERALS) tablet, Take 1 tablet by mouth daily., Disp: , Rfl:    tirzepatide (MOUNJARO) 10 MG/0.5ML Pen, INJECT 10 MG INTO THE SKIN ONE TIME PER WEEK, Disp: 6 mL, Rfl: 0   torsemide (DEMADEX) 20 MG tablet, Take 20 mg by mouth daily. 2 tablets daily, Disp: , Rfl:    traZODone (DESYREL) 50 MG tablet, TAKE 2 TABLETS BY MOUTH AT BEDTIME, Disp: 180 tablet, Rfl: 1   venlafaxine XR (EFFEXOR-XR) 37.5 MG 24 hr capsule, Take 1 capsule every day by oral route., Disp: , Rfl:    Vitamin D, Ergocalciferol, (DRISDOL) 1.25 MG (50000 UNIT) CAPS capsule, TAKE 1 CAPSULE BY MOUTH ONE TIME PER WEEK, Disp: 4 capsule, Rfl: 2  Review of Systems:  Negative unless indicated in HPI.   Physical Exam: Vitals:   02/04/23 1300  BP: 110/80  Pulse: 75  Temp: 98.2 F (36.8 C)  TempSrc: Oral  SpO2: 98%  Weight: 264 lb 1.6 oz (119.8 kg)  Height: 5\' 5"  (1.651 m)    Body mass index is 43.95 kg/m.   Physical  Exam Vitals reviewed.  Constitutional:      General: She is not in acute distress.    Appearance: Normal appearance. She is obese. She is not ill-appearing, toxic-appearing or diaphoretic.  HENT:     Head: Normocephalic.     Right Ear: Tympanic membrane, ear canal and external ear normal. There is no impacted cerumen.     Left Ear:  Tympanic membrane, ear canal and external ear normal. There is no impacted cerumen.     Nose: Nose normal.     Mouth/Throat:     Mouth: Mucous membranes are moist.     Pharynx: Oropharynx is clear. No oropharyngeal exudate or posterior oropharyngeal erythema.  Eyes:     General: No scleral icterus.       Right eye: No discharge.        Left eye: No discharge.     Conjunctiva/sclera: Conjunctivae normal.     Pupils: Pupils are equal, round, and reactive to light.  Neck:     Vascular: No carotid bruit.  Cardiovascular:     Rate and Rhythm: Normal rate and regular rhythm.     Pulses: Normal pulses.     Heart sounds: Normal heart sounds.  Pulmonary:     Effort: Pulmonary effort is normal. No respiratory distress.     Breath sounds: Normal breath sounds.  Abdominal:     General: Abdomen is flat. Bowel sounds are normal.     Palpations: Abdomen is soft.  Musculoskeletal:        General: Normal range of motion.     Cervical back: Normal range of motion.  Skin:    General: Skin is warm and dry.  Neurological:     General: No focal deficit present.     Mental Status: She is alert and oriented to person, place, and time. Mental status is at baseline.  Psychiatric:        Mood and Affect: Mood normal.        Behavior: Behavior normal.        Thought Content: Thought content normal.        Judgment: Judgment normal.     Impression and Plan:  Encounter for preventive health examination  Hypokalemia  Primary hypertension  Acquired hypothyroidism  Type 2 diabetes mellitus with stage 3a chronic kidney disease, without long-term current use of  insulin (HCC) -     Hemoglobin A1c; Future -     CBC with Differential/Platelet; Future -     Comprehensive metabolic panel; Future -     Lipid panel; Future -     Microalbumin / creatinine urine ratio; Future  Vitamin D deficiency  Hyperlipidemia associated with type 2 diabetes mellitus (HCC) -     VITAMIN D 25 Hydroxy (Vit-D Deficiency, Fractures); Future  Hypertriglyceridemia  CKD (chronic kidney disease) stage 4, GFR 15-29 ml/min (HCC)  Immunization due  Morbid obesity (HCC) -     TSH; Future -     Vitamin B12; Future   -Recommend routine eye and dental care. -Healthy lifestyle discussed in detail. -Labs to be updated today. -Prostate cancer screening: N/A Health Maintenance  Topic Date Due   Complete foot exam   Never done   Pneumococcal Vaccination (2 of 2 - PCV) 01/07/2011   Flu Shot  08/07/2022   COVID-19 Vaccine (7 - 2024-25 season) 09/07/2022   Hemoglobin A1C  10/09/2022   Zoster (Shingles) Vaccine (2 of 2) 12/23/2022   Mammogram  01/07/2023   Yearly kidney health urinalysis for diabetes  04/14/2023   Eye exam for diabetics  06/17/2023   Pap with HPV screening  10/12/2023   Yearly kidney function blood test for diabetes  01/15/2024   DTaP/Tdap/Td vaccine (3 - Td or Tdap) 05/16/2028   Colon Cancer Screening  10/25/2028   Hepatitis C Screening  Completed   HIV Screening  Completed   HPV Vaccine  Aged Out     -  Second shingles, second hepatitis B, influenza administered today.  She is still due for PCV 20, COVID, second hepatitis A which will be administered at a later date.     Chaya Jan, MD [facility]

## 2023-02-04 NOTE — Telephone Encounter (Signed)
Called with critical result GFR 14. Reviewed chart and consistent with prior no action taken.

## 2023-02-04 NOTE — Telephone Encounter (Signed)
Critical lab results reported by Saa from [facility]  GFR-14.40  Results called to Dr. Wilfrid Lund call MD for [facility].    Copied from CRM [id number]. Topic: Clinical - Lab/Test Results >> Feb 04, 2023  5:30 PM Corin V wrote: Reason for CRM: Saa with [facility] calling with a critical result for patient.

## 2023-02-06 ENCOUNTER — Encounter: Payer: Self-pay | Admitting: Internal Medicine

## 2023-02-11 ENCOUNTER — Other Ambulatory Visit: Payer: Self-pay | Admitting: Internal Medicine

## 2023-02-11 DIAGNOSIS — I10 Essential (primary) hypertension: Secondary | ICD-10-CM

## 2023-02-12 ENCOUNTER — Encounter: Payer: Self-pay | Admitting: Internal Medicine

## 2023-03-22 ENCOUNTER — Other Ambulatory Visit: Payer: Self-pay | Admitting: Internal Medicine

## 2023-03-22 DIAGNOSIS — E1169 Type 2 diabetes mellitus with other specified complication: Secondary | ICD-10-CM

## 2023-04-10 LAB — VITAMIN D 25 HYDROXY (VIT D DEFICIENCY, FRACTURES): Vit D, 25-Hydroxy: 28

## 2023-04-10 LAB — BASIC METABOLIC PANEL WITH GFR
BUN: 34 — AB (ref 4–21)
CO2: 20 (ref 13–22)
Chloride: 104 (ref 99–108)
Creatinine: 3.7 — AB (ref 0.5–1.1)
Glucose: 104
Potassium: 5 meq/L (ref 3.5–5.1)
Sodium: 140 (ref 137–147)

## 2023-04-10 LAB — COMPREHENSIVE METABOLIC PANEL WITH GFR
Albumin: 4.4 (ref 3.5–5.0)
Calcium: 9.6 (ref 8.7–10.7)
eGFR: 14

## 2023-04-16 ENCOUNTER — Encounter: Payer: Self-pay | Admitting: Internal Medicine

## 2023-04-30 ENCOUNTER — Telehealth: Payer: 59 | Admitting: Adult Health

## 2023-05-01 LAB — HM PAP SMEAR: HPV 16/18/45 genotyping: NEGATIVE

## 2023-05-04 ENCOUNTER — Encounter: Payer: Self-pay | Admitting: Internal Medicine

## 2023-05-04 ENCOUNTER — Ambulatory Visit (INDEPENDENT_AMBULATORY_CARE_PROVIDER_SITE_OTHER): Payer: 59 | Admitting: Internal Medicine

## 2023-05-04 VITALS — BP 133/77 | HR 70 | Temp 98.1°F | Wt 273.1 lb

## 2023-05-04 DIAGNOSIS — G4733 Obstructive sleep apnea (adult) (pediatric): Secondary | ICD-10-CM

## 2023-05-04 DIAGNOSIS — N1831 Chronic kidney disease, stage 3a: Secondary | ICD-10-CM

## 2023-05-04 DIAGNOSIS — Z7984 Long term (current) use of oral hypoglycemic drugs: Secondary | ICD-10-CM | POA: Diagnosis not present

## 2023-05-04 DIAGNOSIS — N184 Chronic kidney disease, stage 4 (severe): Secondary | ICD-10-CM

## 2023-05-04 DIAGNOSIS — E1122 Type 2 diabetes mellitus with diabetic chronic kidney disease: Secondary | ICD-10-CM | POA: Diagnosis not present

## 2023-05-04 DIAGNOSIS — I10 Essential (primary) hypertension: Secondary | ICD-10-CM

## 2023-05-04 LAB — POCT GLYCOSYLATED HEMOGLOBIN (HGB A1C): Hemoglobin A1C: 5.4 % (ref 4.0–5.6)

## 2023-05-04 MED ORDER — TIRZEPATIDE 12.5 MG/0.5ML ~~LOC~~ SOAJ: 12.5000 mg | SUBCUTANEOUS | 0 refills | Status: DC

## 2023-05-04 NOTE — Progress Notes (Signed)
 Established Patient Office Visit     CC/Reason for Visit: Follow-up chronic conditions  HPI: Robyn [name] is a 53 y.o. female who is coming in today for the above mentioned reasons. Past Medical History is significant for: Morbid obesity, hypertension, hyperlipidemia, type 2 diabetes, chronic kidney disease stage IV-V.  She is being followed by nephrology and is now on the kidney transplant list.  She needs to have a hysterectomy prior to the transplant given the large size of her uterus with fibroids.  She has a follow-up for her OSA next month with [facility] neurology.  She would like to try and boost her weight loss and is requesting an increase in Mounjaro  dosing.   Past Medical/Surgical History: Past Medical History:  Diagnosis Date   Abnormal Pap smear    Allergy    Anxiety    OCCAS PANIC ATTACKS   Back pain    Bilateral swelling of feet    Blood transfusion 1990'S   Blood transfusion without reported diagnosis    BV (bacterial vaginosis)    Cough    nonproductive cough last 2 weeks   Depression 2010   Fibroid 2011   Gallbladder problem    GERD (gastroesophageal reflux disease)    occasional   H/O dysmenorrhea    H/O varicella    H/O: menorrhagia 2011   Hypertension    Hypokalemia    PAST HX   Hypothyroid    Increased BMI    Kidney problem    Kidney stones    Obesity    OSA (obstructive sleep apnea) 04/22/2013   Ovarian cyst 2011   Perimenopausal symptoms 07/15/2010   Pre-diabetes    Pregnancy induced hypertension    Shortness of breath    ONLY WITH ANXIETY   Sleep apnea    PT USES CPAP SOMETIMES - SETTING IS 3   Vitamin D  deficiency    Weight loss 2010    Past Surgical History:  Procedure Laterality Date   C-SECTIONS X 2     CESAREAN SECTION  1993 and 1999   CHOLECYSTECTOMY  1994   COLONOSCOPY WITH PROPOFOL  N/A 10/26/2018   Procedure: COLONOSCOPY WITH PROPOFOL ;  Surgeon: Tami Falcon, MD;  [facility]: [facility];  Service: Endoscopy;   Laterality: N/A;   NEPHROLITHOTOMY  04/09/2011   Procedure: NEPHROLITHOTOMY PERCUTANEOUS;  Surgeon: Soledad Dupes, MD;  [facility];  Service: Urology;  Laterality: Right;       NEPHROLITHOTOMY  10/15/2011   Procedure: NEPHROLITHOTOMY PERCUTANEOUS;  Surgeon: Soledad Dupes, MD;  [facility];  Service: Urology;  Laterality: Left;   PERCUTANEOUS NEPHROSTOMY AROUND 1996  may 2013   right kidney   REMOVAL OF STONES  10/15/2011   Procedure: REMOVAL OF STONES;  Surgeon: Soledad Dupes, MD;  [facility];  Service: Urology;  Laterality: Left;   TUBAL LIGATION  1999   URETEROSCOPY  04/09/2011   Procedure: URETEROSCOPY;  Surgeon: Soledad Dupes, MD;  [facility];  Service: Urology;  Laterality: Right;   UTERINE ABLATION MARCH 2010  march 2011    Social History:  reports that she quit smoking about 11 years ago. Her smoking use included cigarettes. She started smoking about 26 years ago. She has a 3.8 pack-year smoking history. She has never used smokeless tobacco. She reports current alcohol use. She reports that she does not use drugs.  Allergies: Allergies  Allergen Reactions   Sulfa Antibiotics Hives and Rash    Family History:  Family History  Problem Relation Age of Onset   Diabetes Mother    Hypertension Mother    Obesity Mother    Anxiety disorder Mother    Arthritis Mother    Depression Mother    Stroke Mother    Hypertension Father    Diabetes Father    Kidney disease Father    Stroke Father    Sleep apnea Father    Vision loss Father    Diabetes Paternal Grandmother    Diabetes Maternal Grandmother    Heart disease Maternal Grandmother      Current Outpatient Medications:    ACCU-CHEK GUIDE test strip, USE TO TEST BLOOD SUGAR EVERY DAY AS DIRECTED BY DOCTOR, Disp: 100 strip, Rfl: 0   allopurinol (ZYLOPRIM) 100 MG tablet, Take 100 mg by mouth daily. , Disp: , Rfl:    amLODipine  (NORVASC ) 10 MG tablet, TAKE 1 TABLET BY MOUTH  EVERY DAY, Disp: 90 tablet, Rfl: 1   atorvastatin  (LIPITOR ) 80 MG tablet, TAKE 1 TABLET BY MOUTH EVERY DAY, Disp: 90 tablet, Rfl: 1   blood glucose meter kit and supplies KIT, Dispense based on patient and insurance preference. Use one time daily as directed. E11.9, Disp: 1 each, Rfl: 0   carvedilol  (COREG ) 25 MG tablet, Take 1 tablet (25 mg total) by mouth 2 (two) times daily with a meal., Disp: 30 tablet, Rfl: 0   docusate sodium  (COLACE) 100 MG capsule, Take 1 capsule (100 mg total) by mouth daily as needed for mild constipation or moderate constipation., Disp: 20 capsule, Rfl: 0   ezetimibe  (ZETIA ) 10 MG tablet, TAKE 1 TABLET BY MOUTH EVERY DAY, Disp: 90 tablet, Rfl: 1   fluticasone  (FLONASE ) 50 MCG/ACT nasal spray, Place 1 spray into both nostrils at bedtime as needed for allergies or rhinitis., Disp: 16 g, Rfl: 0   levothyroxine  (SYNTHROID ) 25 MCG tablet, TAKE 1 TABLET BY MOUTH EVERY DAY, Disp: 90 tablet, Rfl: 1   lidocaine  (LIDODERM ) 5 %, Place 1 patch onto the skin daily. Remove & Discard patch within 12 hours or as directed by MD, Disp: 30 patch, Rfl: 0   Multiple Vitamins-Minerals (MULTIVITAMIN WITH MINERALS) tablet, Take 1 tablet by mouth daily., Disp: , Rfl:    torsemide (DEMADEX) 20 MG tablet, Take 20 mg by mouth daily. 2 tablets daily, Disp: , Rfl:    traZODone  (DESYREL ) 50 MG tablet, TAKE 2 TABLETS BY MOUTH AT BEDTIME, Disp: 180 tablet, Rfl: 1   venlafaxine  XR (EFFEXOR -XR) 37.5 MG 24 hr capsule, Take 1 capsule every day by oral route., Disp: , Rfl:    Vitamin D , Ergocalciferol , (DRISDOL ) 1.25 MG (50000 UNIT) CAPS capsule, TAKE 1 CAPSULE BY MOUTH ONE TIME PER WEEK, Disp: 4 capsule, Rfl: 2   tirzepatide  (MOUNJARO ) 12.5 MG/0.5ML Pen, Inject 12.5 mg into the skin once a week., Disp: 6 mL, Rfl: 0  Review of Systems:  Negative unless indicated in HPI.   Physical Exam: Vitals:   05/04/23 1050  BP: 133/77  Pulse: 70  Temp: 98.1 F (36.7 C)  TempSrc: Oral  SpO2: 100%  Weight: 273  lb 1.6 oz (123.9 kg)    Body mass index is 45.45 kg/m.   Physical Exam Vitals reviewed.  Constitutional:      Appearance: Normal appearance. She is obese.  HENT:     Head: Normocephalic and atraumatic.  Eyes:     Conjunctiva/sclera: Conjunctivae normal.  Cardiovascular:     Rate and Rhythm: Normal rate and regular rhythm.  Pulmonary:  Effort: Pulmonary effort is normal.     Breath sounds: Normal breath sounds.  Skin:    General: Skin is warm and dry.  Neurological:     General: No focal deficit present.     Mental Status: She is alert and oriented to person, place, and time.  Psychiatric:        Mood and Affect: Mood normal.        Behavior: Behavior normal.        Thought Content: Thought content normal.        Judgment: Judgment normal.      Impression and Plan:  Type 2 diabetes mellitus with stage 3a chronic kidney disease, without long-term current use of insulin  (HCC) -     POCT glycosylated hemoglobin (Hb A1C) -     Tirzepatide ; Inject 12.5 mg into the skin once a week.  Dispense: 6 mL; Refill: 0  CKD (chronic kidney disease) stage 4, GFR 15-29 ml/min (HCC)  OSA on CPAP  Primary hypertension   - A1c is stable at 5.4.  Increase Mounjaro  from 10 to 12.5 mg to try and boost weight loss. - Blood pressure is well-controlled on current. - Will follow with neurology in regards to her OSA.  Time spent:31 minutes reviewing chart, interviewing and examining patient and formulating plan of care.     Marguerita Shih, MD [facility]

## 2023-06-16 LAB — HM DIABETES EYE EXAM

## 2023-06-20 ENCOUNTER — Other Ambulatory Visit: Payer: Self-pay | Admitting: Adult Health

## 2023-06-24 NOTE — Telephone Encounter (Signed)
 Pt called inregards to following up on medication inform pt medication is pending

## 2023-07-06 LAB — BASIC METABOLIC PANEL WITH GFR
BUN: 42 — AB (ref 4–21)
CO2: 18 (ref 13–22)
Chloride: 104 (ref 99–108)
Creatinine: 4 — AB (ref 0.5–1.1)
Glucose: 105
Potassium: 5.1 meq/L (ref 3.5–5.1)
Sodium: 138 (ref 137–147)

## 2023-07-06 LAB — CBC AND DIFFERENTIAL
HCT: 36 (ref 36–46)
Hemoglobin: 11.8 — AB (ref 12.0–16.0)
Platelets: 251 K/uL (ref 150–400)

## 2023-07-06 LAB — COMPREHENSIVE METABOLIC PANEL WITH GFR
Albumin: 4.5 (ref 3.5–5.0)
Calcium: 9.4 (ref 8.7–10.7)
eGFR: 13

## 2023-07-06 LAB — VITAMIN D 25 HYDROXY (VIT D DEFICIENCY, FRACTURES): Vit D, 25-Hydroxy: 33.5

## 2023-07-19 ENCOUNTER — Other Ambulatory Visit: Payer: Self-pay | Admitting: Diagnostic Neuroimaging

## 2023-07-21 ENCOUNTER — Encounter: Payer: Self-pay | Admitting: *Deleted

## 2023-07-22 ENCOUNTER — Encounter: Payer: Self-pay | Admitting: Adult Health

## 2023-07-22 ENCOUNTER — Encounter: Payer: Self-pay | Admitting: Internal Medicine

## 2023-07-22 ENCOUNTER — Telehealth: Admitting: Adult Health

## 2023-07-22 VITALS — Ht 65.0 in | Wt 265.0 lb

## 2023-07-22 DIAGNOSIS — G4709 Other insomnia: Secondary | ICD-10-CM

## 2023-07-22 DIAGNOSIS — G4733 Obstructive sleep apnea (adult) (pediatric): Secondary | ICD-10-CM | POA: Diagnosis not present

## 2023-07-22 NOTE — Progress Notes (Signed)
 PATIENT: Robyn [name] DOB: Jan 27, 1970  REASON FOR VISIT: follow up HISTORY FROM: patient PRIMARY NEUROLOGIST: Dr. Chalice   Virtual Visit via Video Note  I connected with Shelda JAYSON Shove on 07/22/23 at 11:30 AM EDT by a video enabled telemedicine application located remotely at [facility] and verified that I am speaking with the correct person using two identifiers who was located at their own home in [place]   I discussed the limitations of evaluation and management by telemedicine and the availability of in person appointments. The patient expressed understanding and agreed to proceed.   PATIENT: Robyn [name] DOB: 03/19/1970  REASON FOR VISIT: follow up HISTORY FROM: patient  HISTORY OF PRESENT ILLNESS: Today 07/22/23  Robyn [name] is a 53 y.o. female with a history of OSA on CPAP. Returns today for follow-up. Reports that she tends to get a stuffy nose at night. Been using Vics nasal spray. Uses dreamwear mask.  She states that she still has trouble falling asleep at night.  She is taking trazodone  100 mg at bedtime.  Typically takes it between 930 and 10 PM but will not fall asleep until after midnight.  She states that she is under a lot of stress.  Has been using the calm app during the day and has been helpful.  She is considering trying this at night as well.       REVIEW OF SYSTEMS: Out of a complete 14 system review of symptoms, the patient complains only of the following symptoms, and all other reviewed systems are negative.  ALLERGIES: Allergies  Allergen Reactions   Sulfa Antibiotics Hives and Rash    HOME MEDICATIONS: Outpatient Medications Prior to Visit  Medication Sig Dispense Refill   ACCU-CHEK GUIDE test strip USE TO TEST BLOOD SUGAR EVERY DAY AS DIRECTED BY DOCTOR 100 strip 0   allopurinol (ZYLOPRIM) 100 MG tablet Take 100 mg by mouth daily.      amLODipine  (NORVASC ) 10 MG tablet TAKE 1 TABLET BY MOUTH EVERY DAY 90  tablet 1   atorvastatin  (LIPITOR ) 80 MG tablet TAKE 1 TABLET BY MOUTH EVERY DAY 90 tablet 1   blood glucose meter kit and supplies KIT Dispense based on patient and insurance preference. Use one time daily as directed. E11.9 1 each 0   carvedilol  (COREG ) 25 MG tablet Take 1 tablet (25 mg total) by mouth 2 (two) times daily with a meal. 30 tablet 0   docusate sodium  (COLACE) 100 MG capsule Take 1 capsule (100 mg total) by mouth daily as needed for mild constipation or moderate constipation. 20 capsule 0   ezetimibe  (ZETIA ) 10 MG tablet TAKE 1 TABLET BY MOUTH EVERY DAY 90 tablet 1   fluticasone  (FLONASE ) 50 MCG/ACT nasal spray Place 1 spray into both nostrils at bedtime as needed for allergies or rhinitis. 16 g 0   levothyroxine  (SYNTHROID ) 25 MCG tablet TAKE 1 TABLET BY MOUTH EVERY DAY 90 tablet 1   lidocaine  (LIDODERM ) 5 % Place 1 patch onto the skin daily. Remove & Discard patch within 12 hours or as directed by MD 30 patch 0   Multiple Vitamins-Minerals (MULTIVITAMIN WITH MINERALS) tablet Take 1 tablet by mouth daily.     tirzepatide  (MOUNJARO ) 12.5 MG/0.5ML Pen Inject 12.5 mg into the skin once a week. 6 mL 0   torsemide (DEMADEX) 20 MG tablet Take 20 mg by mouth daily. 2 tablets daily     traZODone  (DESYREL ) 50 MG tablet TAKE 2 TABLETS  BY MOUTH AT BEDTIME 180 tablet 1   venlafaxine  XR (EFFEXOR -XR) 37.5 MG 24 hr capsule Take 1 capsule every day by oral route.     Vitamin D , Ergocalciferol , (DRISDOL ) 1.25 MG (50000 UNIT) CAPS capsule TAKE 1 CAPSULE BY MOUTH ONE TIME PER WEEK 4 capsule 2   No facility-administered medications prior to visit.    PAST MEDICAL HISTORY: Past Medical History:  Diagnosis Date   Abnormal Pap smear    Allergy    Anxiety    OCCAS PANIC ATTACKS   Back pain    Bilateral swelling of feet    Blood transfusion 1990'S   Blood transfusion without reported diagnosis    BV (bacterial vaginosis)    Cough    nonproductive cough last 2 weeks   Depression 2010   Fibroid  2011   Gallbladder problem    GERD (gastroesophageal reflux disease)    occasional   H/O dysmenorrhea    H/O varicella    H/O: menorrhagia 2011   Hypertension    Hypokalemia    PAST HX   Hypothyroid    Increased BMI    Kidney problem    Kidney stones    Obesity    OSA (obstructive sleep apnea) 04/22/2013   Ovarian cyst 2011   Perimenopausal symptoms 07/15/2010   Pre-diabetes    Pregnancy induced hypertension    Shortness of breath    ONLY WITH ANXIETY   Sleep apnea    PT USES CPAP SOMETIMES - SETTING IS 3   Vitamin D  deficiency    Weight loss 2010    PAST SURGICAL HISTORY: Past Surgical History:  Procedure Laterality Date   C-SECTIONS X 2     CESAREAN SECTION  1993 and 1999   CHOLECYSTECTOMY  1994   COLONOSCOPY WITH PROPOFOL  N/A 10/26/2018   Procedure: COLONOSCOPY WITH PROPOFOL ;  Surgeon: Kristie Lamprey, MD;  Location: [facility];  Service: Endoscopy;  Laterality: N/A;   NEPHROLITHOTOMY  04/09/2011   Procedure: NEPHROLITHOTOMY PERCUTANEOUS;  Surgeon: Toribio Neysa Repine, MD;  Location: [facility];  Service: Urology;  Laterality: Right;       NEPHROLITHOTOMY  10/15/2011   Procedure: NEPHROLITHOTOMY PERCUTANEOUS;  Surgeon: Toribio Neysa Repine, MD;  Location: [facility];  Service: Urology;  Laterality: Left;   PERCUTANEOUS NEPHROSTOMY AROUND 1996  may 2013   right kidney   REMOVAL OF STONES  10/15/2011   Procedure: REMOVAL OF STONES;  Surgeon: Toribio Neysa Repine, MD;  Location: [facility];  Service: Urology;  Laterality: Left;   TUBAL LIGATION  1999   URETEROSCOPY  04/09/2011   Procedure: URETEROSCOPY;  Surgeon: Toribio Neysa Repine, MD;  Location: [facility];  Service: Urology;  Laterality: Right;   UTERINE ABLATION MARCH 2010  march 2011    FAMILY HISTORY: Family History  Problem Relation Age of Onset   Diabetes Mother    Hypertension Mother    Obesity Mother    Anxiety disorder Mother    Arthritis Mother    Depression Mother    Stroke Mother    Hypertension Father     Diabetes Father    Kidney disease Father    Stroke Father    Sleep apnea Father    Vision loss Father    Diabetes Paternal Grandmother    Diabetes Maternal Grandmother    Heart disease Maternal Grandmother     SOCIAL HISTORY: Social History   Socioeconomic History   Marital status: Married    Spouse name: Zachary   Number of children: 2  Years of education: 15+   Highest education level: Master's degree (e.g., MA, MS, MEng, MEd, MSW, MBA)  Occupational History   Occupation: PROGRAM Aeronautical engineer: [employer]  Tobacco Use   Smoking status: Former    Current packs/day: 0.00    Average packs/day: 0.3 packs/day for 15.0 years (3.8 ttl pk-yrs)    Types: Cigarettes    Start date: 12/25/1996    Quit date: 12/26/2011    Years since quitting: 11.5   Smokeless tobacco: Never  Substance and Sexual Activity   Alcohol use: Yes    Comment: occasional   Drug use: No   Sexual activity: Yes    Birth control/protection: Surgical    Comment: BTL 1999  Other Topics Concern   Not on file  Social History Narrative   Patient is married Gen[place]) and lives at home with her husband and daughter.   Patient has two children.   Patient is working and attending college full-time.   Patient has a college education.   Patient is right-handed.   Patient drinks two cups of coffee daily and one cup of soda and tea daily.   Social Drivers of Corporate investment banker Strain: Low Risk  (02/03/2023)   Overall Financial Resource Strain (CARDIA)    Difficulty of Paying Living Expenses: Not hard at all  Food Insecurity: No Food Insecurity (02/03/2023)   Hunger Vital Sign    Worried About Running Out of Food in the Last Year: Never true    Ran Out of Food in the Last Year: Never true  Transportation Needs: No Transportation Needs (02/03/2023)   PRAPARE - Administrator, Civil Service (Medical): No    Lack of Transportation (Non-Medical): No  Physical Activity: Insufficiently  Active (02/03/2023)   Exercise Vital Sign    Days of Exercise per Week: 1 day    Minutes of Exercise per Session: 10 min  Stress: Stress Concern Present (02/03/2023)   [employer] of Occupational Health - Occupational Stress Questionnaire    Feeling of Stress : To some extent  Social Connections: Socially Integrated (02/03/2023)   Social Connection and Isolation Panel    Frequency of Communication with Friends and Family: More than three times a week    Frequency of Social Gatherings with Friends and Family: Once a week    Attends Religious Services: More than 4 times per year    Active Member of [employer] or Organizations: Yes    Attends Engineer, structural: More than 4 times per year    Marital Status: Married  Catering manager Violence: Not on file      PHYSICAL EXAM Generalized: Well developed, in no acute distress   Neurological examination  Mentation: Alert oriented to time, place, history taking. Follows all commands speech and language fluent Cranial nerve II-XII: Facial symmetry noted.   DIAGNOSTIC DATA (LABS, IMAGING, TESTING) - I reviewed patient records, labs, notes, testing and imaging myself where available.  Lab Results  Component Value Date   WBC 7.3 02/04/2023   HGB 11.8 (A) 07/06/2023   HCT 36 07/06/2023   MCV 90.3 02/04/2023   PLT 251 07/06/2023      Component Value Date/Time   NA 138 07/06/2023 0000   K 5.1 07/06/2023 0000   CL 104 07/06/2023 0000   CO2 18 07/06/2023 0000   GLUCOSE 98 02/04/2023 1342   BUN 42 (A) 07/06/2023 0000   CREATININE 4.0 (A) 07/06/2023 0000   CREATININE  3.52 (H) 02/04/2023 1342   CREATININE 1.57 (H) 11/17/2019 0903   CALCIUM  9.4 07/06/2023 0000   PROT 7.4 02/04/2023 1342   PROT 6.6 11/29/2018 0815   ALBUMIN 4.5 07/06/2023 0000   ALBUMIN 4.2 11/29/2018 0815   AST 16 02/04/2023 1342   ALT 29 02/04/2023 1342   ALKPHOS 107 02/04/2023 1342   BILITOT 1.1 02/04/2023 1342   BILITOT 0.8 11/29/2018 0815   GFRNONAA  10 (L) 04/15/2022 0326   GFRAA 26 01/14/2021 0000   Lab Results  Component Value Date   CHOL 109 02/04/2023   HDL 43.00 02/04/2023   LDLCALC 48 02/04/2023   LDLDIRECT 43.0 05/15/2021   TRIG 92.0 02/04/2023   CHOLHDL 3 02/04/2023   Lab Results  Component Value Date   HGBA1C 5.4 05/04/2023   Lab Results  Component Value Date   VITAMINB12 783 02/04/2023   Lab Results  Component Value Date   TSH 3.20 02/04/2023      ASSESSMENT AND PLAN 53 y.o. year old female  has a past medical history of Abnormal Pap smear, Allergy, Anxiety, Back pain, Bilateral swelling of feet, Blood transfusion (1990'S), Blood transfusion without reported diagnosis, BV (bacterial vaginosis), Cough, Depression (2010), Fibroid (2011), Gallbladder problem, GERD (gastroesophageal reflux disease), H/O dysmenorrhea, H/O varicella, H/O: menorrhagia (2011), Hypertension, Hypokalemia, Hypothyroid, Increased BMI, Kidney problem, Kidney stones, Obesity, OSA (obstructive sleep apnea) (04/22/2013), Ovarian cyst (2011), Perimenopausal symptoms (07/15/2010), Pre-diabetes, Pregnancy induced hypertension, Shortness of breath, Sleep apnea, Vitamin D  deficiency, and Weight loss (2010). here with:  OSA on CPAP  CPAP compliance excellent Residual AHI is good Encouraged patient to continue using CPAP nightly and > 4 hours each night Encouraged her to try taking trazodone  earlier in the evening to see if that is beneficial. Also mention she could try supplements such as magnesium  glycinate but did advise her to consult with her PCP before taking. F/U in 6 months or sooner if needed    Duwaine Russell, MSN, NP-C 07/22/2023, 11:34 AM [facility] [address], [address] [place], [place] [address] [phone]  The patient's condition requires frequent monitoring and adjustments in the treatment plan, reflecting the ongoing complexity of care.  This provider is the continuing focal point for all needed services for  this condition.

## 2023-07-22 NOTE — Patient Instructions (Signed)
 Continue using CPAP nightly and greater than 4 hours each night Consider taking trazodone  earlier in the evening to see if that is more beneficial Can consider over-the-counter supplement magnesium  glycinate-however I recommend that you discuss this with your PCP before taking. If your symptoms worsen or you develop new symptoms please let us  know.

## 2023-07-26 ENCOUNTER — Other Ambulatory Visit: Payer: Self-pay | Admitting: Internal Medicine

## 2023-07-26 DIAGNOSIS — N1831 Chronic kidney disease, stage 3a: Secondary | ICD-10-CM

## 2023-07-31 ENCOUNTER — Other Ambulatory Visit: Payer: Self-pay | Admitting: Internal Medicine

## 2023-07-31 DIAGNOSIS — I10 Essential (primary) hypertension: Secondary | ICD-10-CM

## 2023-08-03 ENCOUNTER — Ambulatory Visit: Admitting: Family Medicine

## 2023-08-03 ENCOUNTER — Ambulatory Visit: Admitting: Internal Medicine

## 2023-08-03 DIAGNOSIS — E1122 Type 2 diabetes mellitus with diabetic chronic kidney disease: Secondary | ICD-10-CM

## 2023-08-11 ENCOUNTER — Encounter: Payer: Self-pay | Admitting: Internal Medicine

## 2023-08-11 ENCOUNTER — Ambulatory Visit (INDEPENDENT_AMBULATORY_CARE_PROVIDER_SITE_OTHER): Admitting: Internal Medicine

## 2023-08-11 VITALS — BP 110/78 | HR 82 | Temp 98.3°F | Wt 271.1 lb

## 2023-08-11 DIAGNOSIS — E1122 Type 2 diabetes mellitus with diabetic chronic kidney disease: Secondary | ICD-10-CM | POA: Diagnosis not present

## 2023-08-11 DIAGNOSIS — I10 Essential (primary) hypertension: Secondary | ICD-10-CM

## 2023-08-11 DIAGNOSIS — N184 Chronic kidney disease, stage 4 (severe): Secondary | ICD-10-CM | POA: Diagnosis not present

## 2023-08-11 DIAGNOSIS — E785 Hyperlipidemia, unspecified: Secondary | ICD-10-CM

## 2023-08-11 DIAGNOSIS — N1831 Chronic kidney disease, stage 3a: Secondary | ICD-10-CM | POA: Diagnosis not present

## 2023-08-11 DIAGNOSIS — E66813 Obesity, class 3: Secondary | ICD-10-CM

## 2023-08-11 DIAGNOSIS — G4733 Obstructive sleep apnea (adult) (pediatric): Secondary | ICD-10-CM

## 2023-08-11 DIAGNOSIS — E1169 Type 2 diabetes mellitus with other specified complication: Secondary | ICD-10-CM

## 2023-08-11 DIAGNOSIS — Z6841 Body Mass Index (BMI) 40.0 and over, adult: Secondary | ICD-10-CM

## 2023-08-11 LAB — POCT GLYCOSYLATED HEMOGLOBIN (HGB A1C): Hemoglobin A1C: 5.3 % (ref 4.0–5.6)

## 2023-08-11 MED ORDER — TIRZEPATIDE 15 MG/0.5ML ~~LOC~~ SOAJ: 15.0000 mg | SUBCUTANEOUS | 1 refills | Status: AC

## 2023-08-11 NOTE — Progress Notes (Signed)
 Established Patient Office Visit     CC/Reason for Visit: Follow-up chronic medical conditions  HPI: Robyn [name] is a 53 y.o. female who is coming in today for the above mentioned reasons. Past Medical History is significant for: Hypertension, hyperlipidemia, type 2 diabetes, morbid obesity, OSA on CPAP, stage IV-V chronic kidney disease.  She has been feeling well.  She is requesting an increase in Mounjaro  dose.  Her weight loss surgery has been approved by insurance, has not yet been scheduled.   Past Medical/Surgical History: Past Medical History:  Diagnosis Date   Abnormal Pap smear    Allergy    Anxiety    OCCAS PANIC ATTACKS   Back pain    Bilateral swelling of feet    Blood transfusion 1990'S   Blood transfusion without reported diagnosis    BV (bacterial vaginosis)    Cough    nonproductive cough last 2 weeks   Depression 2010   Fibroid 2011   Gallbladder problem    GERD (gastroesophageal reflux disease)    occasional   H/O dysmenorrhea    H/O varicella    H/O: menorrhagia 2011   Hypertension    Hypokalemia    PAST HX   Hypothyroid    Increased BMI    Kidney problem    Kidney stones    Obesity    OSA (obstructive sleep apnea) 04/22/2013   Ovarian cyst 2011   Perimenopausal symptoms 07/15/2010   Pre-diabetes    Pregnancy induced hypertension    Shortness of breath    ONLY WITH ANXIETY   Sleep apnea    PT USES CPAP SOMETIMES - SETTING IS 3   Vitamin D  deficiency    Weight loss 2010    Past Surgical History:  Procedure Laterality Date   C-SECTIONS X 2     CESAREAN SECTION  1993 and 1999   CHOLECYSTECTOMY  1994   COLONOSCOPY WITH PROPOFOL  N/A 10/26/2018   Procedure: COLONOSCOPY WITH PROPOFOL ;  Surgeon: Kristie Lamprey, MD;  Location: [facility];  Service: Endoscopy;  Laterality: N/A;   NEPHROLITHOTOMY  04/09/2011   Procedure: NEPHROLITHOTOMY PERCUTANEOUS;  Surgeon: Toribio Neysa Repine, MD;  [facility];  Service: Urology;  Laterality:  Right;       NEPHROLITHOTOMY  10/15/2011   Procedure: NEPHROLITHOTOMY PERCUTANEOUS;  Surgeon: Toribio Neysa Repine, MD;  Location: [facility];  Service: Urology;  Laterality: Left;   PERCUTANEOUS NEPHROSTOMY AROUND 1996  may 2013   right kidney   REMOVAL OF STONES  10/15/2011   Procedure: REMOVAL OF STONES;  Surgeon: Toribio Neysa Repine, MD;  Location: [facility];  Service: Urology;  Laterality: Left;   TUBAL LIGATION  1999   URETEROSCOPY  04/09/2011   Procedure: URETEROSCOPY;  Surgeon: Toribio Neysa Repine, MD;  Location: [facility];  Service: Urology;  Laterality: Right;   UTERINE ABLATION MARCH 2010  march 2011    Social History:  reports that she quit smoking about 11 years ago. Her smoking use included cigarettes. She started smoking about 26 years ago. She has a 3.8 pack-year smoking history. She has never used smokeless tobacco. She reports current alcohol use. She reports that she does not use drugs.  Allergies: Allergies  Allergen Reactions   Sulfa Antibiotics Hives and Rash    Family History:  Family History  Problem Relation Age of Onset   Diabetes Mother    Hypertension Mother    Obesity Mother    Anxiety disorder Mother    Arthritis Mother  Depression Mother    Stroke Mother    Hypertension Father    Diabetes Father    Kidney disease Father    Stroke Father    Sleep apnea Father    Vision loss Father    Diabetes Paternal Grandmother    Diabetes Maternal Grandmother    Heart disease Maternal Grandmother      Current Outpatient Medications:    ACCU-CHEK GUIDE test strip, USE TO TEST BLOOD SUGAR EVERY DAY AS DIRECTED BY DOCTOR, Disp: 100 strip, Rfl: 0   allopurinol (ZYLOPRIM) 100 MG tablet, Take 100 mg by mouth daily. , Disp: , Rfl:    amLODipine  (NORVASC ) 10 MG tablet, TAKE 1 TABLET BY MOUTH EVERY DAY, Disp: 90 tablet, Rfl: 1   atorvastatin  (LIPITOR ) 80 MG tablet, TAKE 1 TABLET BY MOUTH EVERY DAY, Disp: 90 tablet, Rfl: 1   blood glucose meter kit and supplies KIT,  Dispense based on patient and insurance preference. Use one time daily as directed. E11.9, Disp: 1 each, Rfl: 0   carvedilol  (COREG ) 25 MG tablet, Take 1 tablet (25 mg total) by mouth 2 (two) times daily with a meal., Disp: 30 tablet, Rfl: 0   docusate sodium  (COLACE) 100 MG capsule, Take 1 capsule (100 mg total) by mouth daily as needed for mild constipation or moderate constipation., Disp: 20 capsule, Rfl: 0   ezetimibe  (ZETIA ) 10 MG tablet, TAKE 1 TABLET BY MOUTH EVERY DAY, Disp: 90 tablet, Rfl: 1   fluticasone  (FLONASE ) 50 MCG/ACT nasal spray, Place 1 spray into both nostrils at bedtime as needed for allergies or rhinitis., Disp: 16 g, Rfl: 0   levothyroxine  (SYNTHROID ) 25 MCG tablet, TAKE 1 TABLET BY MOUTH EVERY DAY, Disp: 90 tablet, Rfl: 1   lidocaine  (LIDODERM ) 5 %, Place 1 patch onto the skin daily. Remove & Discard patch within 12 hours or as directed by MD, Disp: 30 patch, Rfl: 0   Multiple Vitamins-Minerals (MULTIVITAMIN WITH MINERALS) tablet, Take 1 tablet by mouth daily., Disp: , Rfl:    tirzepatide  (MOUNJARO ) 15 MG/0.5ML Pen, Inject 15 mg into the skin once a week., Disp: 6 mL, Rfl: 1   torsemide (DEMADEX) 20 MG tablet, Take 20 mg by mouth daily. 2 tablets daily, Disp: , Rfl:    traZODone  (DESYREL ) 50 MG tablet, TAKE 2 TABLETS BY MOUTH AT BEDTIME, Disp: 180 tablet, Rfl: 1   venlafaxine  XR (EFFEXOR -XR) 37.5 MG 24 hr capsule, Take 1 capsule every day by oral route., Disp: , Rfl:    Vitamin D , Ergocalciferol , (DRISDOL ) 1.25 MG (50000 UNIT) CAPS capsule, TAKE 1 CAPSULE BY MOUTH ONE TIME PER WEEK, Disp: 4 capsule, Rfl: 2  Review of Systems:  Negative unless indicated in HPI.   Physical Exam: Vitals:   08/11/23 0953  BP: 110/78  Pulse: 82  Temp: 98.3 F (36.8 C)  TempSrc: Oral  SpO2: 99%  Weight: 271 lb 1.6 oz (123 kg)    Body mass index is 45.11 kg/m.   Physical Exam Vitals reviewed.  Constitutional:      Appearance: Normal appearance. She is obese.  HENT:     Head:  Normocephalic and atraumatic.  Eyes:     Conjunctiva/sclera: Conjunctivae normal.  Cardiovascular:     Rate and Rhythm: Normal rate and regular rhythm.  Pulmonary:     Effort: Pulmonary effort is normal.     Breath sounds: Normal breath sounds.  Skin:    General: Skin is warm and dry.  Neurological:     General: No focal  deficit present.     Mental Status: She is alert and oriented to person, place, and time.  Psychiatric:        Mood and Affect: Mood normal.        Behavior: Behavior normal.        Thought Content: Thought content normal.        Judgment: Judgment normal.      Impression and Plan:  Type 2 diabetes mellitus with stage 3a chronic kidney disease, without long-term current use of insulin  (HCC) -     POCT glycosylated hemoglobin (Hb A1C) -     Tirzepatide ; Inject 15 mg into the skin once a week.  Dispense: 6 mL; Refill: 1  CKD (chronic kidney disease) stage 4, GFR 15-29 ml/min (HCC)  Primary hypertension  OSA on CPAP  Hyperlipidemia associated with type 2 diabetes mellitus (HCC)  Class 3 severe obesity with serious comorbidity and body mass index (BMI) of 45.0 to 49.9 in adult   - Diabetes is very well-controlled with an A1c of 5.3.  Increase Mounjaro  to 50 mg to boost weight loss. - Blood pressure is well-controlled on current. - Cholesterol is well-controlled on current with an LDL of 48. - Continue CPAP therapy for her OSA. - Continue follow-up with nephrology in regards to her advanced chronic kidney disease.  Time spent:30 minutes reviewing chart, interviewing and examining patient and formulating plan of care.     Tully Theophilus Andrews, MD [facility]

## 2023-09-10 ENCOUNTER — Other Ambulatory Visit: Payer: Self-pay | Admitting: Internal Medicine

## 2023-09-10 DIAGNOSIS — E1169 Type 2 diabetes mellitus with other specified complication: Secondary | ICD-10-CM

## 2023-12-04 ENCOUNTER — Other Ambulatory Visit: Payer: Self-pay | Admitting: Internal Medicine

## 2023-12-04 DIAGNOSIS — E1169 Type 2 diabetes mellitus with other specified complication: Secondary | ICD-10-CM

## 2023-12-08 NOTE — Telephone Encounter (Signed)
 Reached out to patient to discuss rescheduling her upcoming procedures.  VM Left with New Surgery date of 05/17/2024 at the [facility] with Dr Elyn Mettle.   Patient has been advised on her VM that she will receive a call the day before  with her arrival time and pre op instructions.  Confirmation letters have been mailed to the patient  and sent in [facility] if applicable.

## 2023-12-14 ENCOUNTER — Ambulatory Visit: Admitting: Internal Medicine

## 2023-12-14 ENCOUNTER — Encounter: Payer: Self-pay | Admitting: Internal Medicine

## 2023-12-14 VITALS — BP 110/78 | HR 70 | Temp 98.1°F | Wt 234.7 lb

## 2023-12-14 DIAGNOSIS — I10 Essential (primary) hypertension: Secondary | ICD-10-CM

## 2023-12-14 DIAGNOSIS — E559 Vitamin D deficiency, unspecified: Secondary | ICD-10-CM

## 2023-12-14 DIAGNOSIS — E039 Hypothyroidism, unspecified: Secondary | ICD-10-CM

## 2023-12-14 DIAGNOSIS — N184 Chronic kidney disease, stage 4 (severe): Secondary | ICD-10-CM

## 2023-12-14 DIAGNOSIS — G4733 Obstructive sleep apnea (adult) (pediatric): Secondary | ICD-10-CM

## 2023-12-14 DIAGNOSIS — Z23 Encounter for immunization: Secondary | ICD-10-CM

## 2023-12-14 DIAGNOSIS — N1831 Chronic kidney disease, stage 3a: Secondary | ICD-10-CM

## 2023-12-14 LAB — VITAMIN D 25 HYDROXY (VIT D DEFICIENCY, FRACTURES): VITD: 36.05 ng/mL (ref 30.00–100.00)

## 2023-12-14 LAB — POCT GLYCOSYLATED HEMOGLOBIN (HGB A1C): Hemoglobin A1C: 4.8 % (ref 4.0–5.6)

## 2023-12-14 LAB — LIPID PANEL
Cholesterol: 196 mg/dL (ref 0–200)
HDL: 51.9 mg/dL (ref >39.00)
LDL Cholesterol: 118 mg/dL — ABNORMAL HIGH (ref 0–99)
NonHDL: 144.48
Total CHOL/HDL Ratio: 4
Triglycerides: 132 mg/dL (ref 0.0–149.0)
VLDL: 26.4 mg/dL (ref 0.0–40.0)

## 2023-12-14 LAB — MICROALBUMIN / CREATININE URINE RATIO
Creatinine,U: 196.1 mg/dL
Microalb Creat Ratio: 1451.4 mg/g — ABNORMAL HIGH (ref 0.0–30.0)
Microalb, Ur: 284.7 mg/dL — ABNORMAL HIGH (ref 0.0–1.9)

## 2023-12-14 NOTE — Assessment & Plan Note (Signed)
 Check levels today.

## 2023-12-14 NOTE — Assessment & Plan Note (Signed)
 Excellent control with an A1c of 4.8.

## 2023-12-14 NOTE — Assessment & Plan Note (Signed)
 Last TSH within range

## 2023-12-14 NOTE — Assessment & Plan Note (Signed)
 Stable with creatinine between 3.8 and 3.95.  Followed by nephrology every 3 months.

## 2023-12-14 NOTE — Assessment & Plan Note (Signed)
On CPAP therapy  

## 2023-12-14 NOTE — Progress Notes (Signed)
 Established Patient Office Visit     CC/Reason for Visit: Follow-up chronic conditions  HPI: Robyn [name] is a 53 y.o. female who is coming in today for the above mentioned reasons. Past Medical History is significant for: Hypertension, hyperlipidemia, type 2 diabetes, morbid obesity, OSA, chronic kidney disease stage IV-V.  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  Has dropped over 40 pounds.  She is feeling well.  She has been off statin since October.   Past Medical/Surgical History: Past Medical History:  Diagnosis Date   Abnormal Pap smear    Allergy    Anxiety    OCCAS PANIC ATTACKS   Back pain    Bilateral swelling of feet    Blood transfusion 1990'S   Blood transfusion without reported diagnosis    BV (bacterial vaginosis)    Cough    nonproductive cough last 2 weeks   Depression 2010   Fibroid 2011   Gallbladder problem    GERD (gastroesophageal reflux disease)    occasional   H/O dysmenorrhea    H/O varicella    H/O: menorrhagia 2011   Hypertension    Hypokalemia    PAST HX   Hypothyroid    Increased BMI    Kidney problem    Kidney stones    Obesity    OSA (obstructive sleep apnea) 04/22/2013   Ovarian cyst 2011   Perimenopausal symptoms 07/15/2010   Pre-diabetes    Pregnancy induced hypertension    Shortness of breath    ONLY WITH ANXIETY   Sleep apnea    PT USES CPAP SOMETIMES - SETTING IS 3   Vitamin D  deficiency    Weight loss 2010    Past Surgical History:  Procedure Laterality Date   C-SECTIONS X 2     CESAREAN SECTION  1993 and 1999   CHOLECYSTECTOMY  1994   COLONOSCOPY WITH PROPOFOL  N/A 10/26/2018   Procedure: COLONOSCOPY WITH PROPOFOL ;  Surgeon: Kristie Lamprey, MD;  Location: [facility];  Service: Endoscopy;  Laterality: N/A;   NEPHROLITHOTOMY  04/09/2011   Procedure: NEPHROLITHOTOMY PERCUTANEOUS;  Surgeon: Toribio Neysa Repine, MD;  Location: [facility];  Service: Urology;  Laterality: Right;       NEPHROLITHOTOMY   10/15/2011   Procedure: NEPHROLITHOTOMY PERCUTANEOUS;  Surgeon: Toribio Neysa Repine, MD;  Location: [facility];  Service: Urology;  Laterality: Left;   PERCUTANEOUS NEPHROSTOMY AROUND 1996  may 2013   right kidney   REMOVAL OF STONES  10/15/2011   Procedure: REMOVAL OF STONES;  Surgeon: Toribio Neysa Repine, MD;  Location: [facility];  Service: Urology;  Laterality: Left;   TUBAL LIGATION  1999   URETEROSCOPY  04/09/2011   Procedure: URETEROSCOPY;  Surgeon: Toribio Neysa Repine, MD;  Location: [facility];  Service: Urology;  Laterality: Right;   UTERINE ABLATION MARCH 2010  march 2011    Social History:  reports that she quit smoking about 11 years ago. Her smoking use included cigarettes. She started smoking about 26 years ago. She has a 3.8 pack-year smoking history. She has never used smokeless tobacco. She reports current alcohol use. She reports that she does not use drugs.  Allergies: Allergies  Allergen Reactions   Sulfa Antibiotics Hives and Rash    Family History:  Family History  Problem Relation Age of Onset   Diabetes Mother    Hypertension Mother    Obesity Mother    Anxiety disorder Mother    Arthritis Mother    Depression Mother  Stroke Mother    Hypertension Father    Diabetes Father    Kidney disease Father    Stroke Father    Sleep apnea Father    Vision loss Father    Diabetes Paternal Grandmother    Diabetes Maternal Grandmother    Heart disease Maternal Grandmother      Current Outpatient Medications:    ACCU-CHEK GUIDE test strip, USE TO TEST BLOOD SUGAR EVERY DAY AS DIRECTED BY DOCTOR, Disp: 100 strip, Rfl: 0   allopurinol (ZYLOPRIM) 100 MG tablet, Take 100 mg by mouth daily. , Disp: , Rfl:    amLODipine  (NORVASC ) 10 MG tablet, TAKE 1 TABLET BY MOUTH EVERY DAY, Disp: 90 tablet, Rfl: 1   atorvastatin  (LIPITOR ) 80 MG tablet, TAKE 1 TABLET BY MOUTH EVERY DAY, Disp: 90 tablet, Rfl: 0   blood glucose meter kit and supplies KIT, Dispense based on patient and  insurance preference. Use one time daily as directed. E11.9, Disp: 1 each, Rfl: 0   calcium  carbonate (OS-CAL) 1250 (500 Ca) MG chewable tablet, Chew 1 tablet by mouth daily., Disp: , Rfl:    carvedilol  (COREG ) 25 MG tablet, Take 1 tablet (25 mg total) by mouth 2 (two) times daily with a meal., Disp: 30 tablet, Rfl: 0   docusate sodium  (COLACE) 100 MG capsule, Take 1 capsule (100 mg total) by mouth daily as needed for mild constipation or moderate constipation., Disp: 20 capsule, Rfl: 0   ezetimibe  (ZETIA ) 10 MG tablet, TAKE 1 TABLET BY MOUTH EVERY DAY, Disp: 90 tablet, Rfl: 0   fluticasone  (FLONASE ) 50 MCG/ACT nasal spray, Place 1 spray into both nostrils at bedtime as needed for allergies or rhinitis., Disp: 16 g, Rfl: 0   levothyroxine  (SYNTHROID ) 25 MCG tablet, TAKE 1 TABLET BY MOUTH EVERY DAY, Disp: 90 tablet, Rfl: 0   levothyroxine  (SYNTHROID ) 25 MCG tablet, Take 25 mcg by mouth daily before breakfast., Disp: , Rfl:    Multiple Vitamins-Minerals (MULTIVITAMIN WITH MINERALS) tablet, Take 1 tablet by mouth daily., Disp: , Rfl:    omeprazole (PRILOSEC) 20 MG capsule, Take 20 mg by mouth daily., Disp: , Rfl:    tirzepatide  (MOUNJARO ) 15 MG/0.5ML Pen, Inject 15 mg into the skin once a week., Disp: 6 mL, Rfl: 1   torsemide (DEMADEX) 20 MG tablet, Take 20 mg by mouth daily. 2 tablets daily, Disp: , Rfl:    traZODone  (DESYREL ) 50 MG tablet, TAKE 2 TABLETS BY MOUTH AT BEDTIME, Disp: 180 tablet, Rfl: 1   venlafaxine  XR (EFFEXOR -XR) 37.5 MG 24 hr capsule, Take 1 capsule every day by oral route., Disp: , Rfl:    Vitamin D , Ergocalciferol , (DRISDOL ) 1.25 MG (50000 UNIT) CAPS capsule, TAKE 1 CAPSULE BY MOUTH ONE TIME PER WEEK, Disp: 4 capsule, Rfl: 2   lidocaine  (LIDODERM ) 5 %, Place 1 patch onto the skin daily. Remove & Discard patch within 12 hours or as directed by MD (Patient not taking: Reported on 12/14/2023), Disp: 30 patch, Rfl: 0  Review of Systems:  Negative unless indicated in HPI.   Physical  Exam: Vitals:   12/14/23 1036 12/14/23 1040 12/14/23 1041 12/14/23 1119  BP: (!) 140/90 (!) 139/90 (!) 142/92 110/78  Pulse: 70     Temp: 98.1 F (36.7 C)     SpO2: 99%     Weight: 234 lb 11.2 oz (106.5 kg)       Body mass index is 39.06 kg/m.   Physical Exam Vitals reviewed.  Constitutional:  Appearance: Normal appearance.  HENT:     Head: Normocephalic and atraumatic.  Eyes:     Conjunctiva/sclera: Conjunctivae normal.  Cardiovascular:     Rate and Rhythm: Normal rate and regular rhythm.  Pulmonary:     Effort: Pulmonary effort is normal.     Breath sounds: Normal breath sounds.  Skin:    General: Skin is warm and dry.  Neurological:     General: No focal deficit present.     Mental Status: She is alert and oriented to person, place, and time.  Psychiatric:        Mood and Affect: Mood normal.        Behavior: Behavior normal.        Thought Content: Thought content normal.        Judgment: Judgment normal.      Impression and Plan:  Type 2 diabetes mellitus with stage 3a chronic kidney disease, without long-term current use of insulin  (HCC) Assessment & Plan: Excellent control with an A1c of 4.8.  Orders: -     POCT glycosylated hemoglobin (Hb A1C) -     Microalbumin / creatinine urine ratio; Future -     Lipid panel; Future  Acquired hypothyroidism Assessment & Plan: Last TSH within range.   Obstructive sleep apnea syndrome Assessment & Plan: On CPAP therapy.   Primary hypertension Assessment & Plan: Fairly well-controlled.   CKD (chronic kidney disease) stage 4, GFR 15-29 ml/min (HCC) Assessment & Plan: Stable with creatinine between 3.8 and 3.95.  Followed by nephrology every 3 months.   Vitamin D  deficiency Assessment & Plan: Check levels today.  Orders: -     VITAMIN D  25 Hydroxy (Vit-D Deficiency, Fractures); Future  Needs flu shot -     Flu vaccine trivalent PF, 6mos and older(Flulaval,Afluria,Fluarix,Fluzone)  Morbid  obesity (HCC)  -Discussed healthy lifestyle, including increased physical activity and better food choices to promote weight loss.    Time spent:32 minutes reviewing chart, interviewing and examining patient and formulating plan of care.     Tully Theophilus Andrews, MD [facility]

## 2023-12-14 NOTE — Assessment & Plan Note (Signed)
Fairly well controlled 

## 2023-12-15 ENCOUNTER — Ambulatory Visit: Payer: Self-pay | Admitting: Internal Medicine

## 2023-12-21 ENCOUNTER — Other Ambulatory Visit: Payer: Self-pay | Admitting: Obstetrics and Gynecology

## 2023-12-21 DIAGNOSIS — Z1231 Encounter for screening mammogram for malignant neoplasm of breast: Secondary | ICD-10-CM

## 2024-01-07 ENCOUNTER — Other Ambulatory Visit: Payer: Self-pay | Admitting: Adult Health

## 2024-01-12 DIAGNOSIS — I10 Essential (primary) hypertension: Secondary | ICD-10-CM

## 2024-01-15 ENCOUNTER — Ambulatory Visit
Admission: RE | Admit: 2024-01-15 | Discharge: 2024-01-15 | Disposition: A | Source: Ambulatory Visit | Attending: Obstetrics and Gynecology | Admitting: Obstetrics and Gynecology

## 2024-01-15 DIAGNOSIS — Z1231 Encounter for screening mammogram for malignant neoplasm of breast: Secondary | ICD-10-CM

## 2024-02-03 ENCOUNTER — Other Ambulatory Visit: Payer: Self-pay | Admitting: Adult Health

## 2024-03-22 ENCOUNTER — Telehealth: Admitting: Adult Health

## 2024-03-24 ENCOUNTER — Encounter: Admitting: Internal Medicine

## 2024-04-11 ENCOUNTER — Ambulatory Visit: Admitting: Internal Medicine

## 2024-04-13 ENCOUNTER — Ambulatory Visit: Admitting: Internal Medicine
